# Patient Record
Sex: Female | Born: 1960
Health system: Southern US, Community
[De-identification: ages and names within clinical notes are randomized; demographics above are authoritative.]

## PROBLEM LIST (undated history)

## (undated) DIAGNOSIS — T8859XA Other complications of anesthesia, initial encounter: Secondary | ICD-10-CM

## (undated) DIAGNOSIS — F32A Depression, unspecified: Secondary | ICD-10-CM

## (undated) DIAGNOSIS — H539 Unspecified visual disturbance: Secondary | ICD-10-CM

## (undated) DIAGNOSIS — H269 Unspecified cataract: Secondary | ICD-10-CM

## (undated) DIAGNOSIS — M542 Cervicalgia: Secondary | ICD-10-CM

## (undated) DIAGNOSIS — C50919 Malignant neoplasm of unspecified site of unspecified female breast: Secondary | ICD-10-CM

## (undated) DIAGNOSIS — I341 Nonrheumatic mitral (valve) prolapse: Secondary | ICD-10-CM

## (undated) DIAGNOSIS — R519 Headache, unspecified: Secondary | ICD-10-CM

## (undated) DIAGNOSIS — Z923 Personal history of irradiation: Secondary | ICD-10-CM

## (undated) DIAGNOSIS — Z9221 Personal history of antineoplastic chemotherapy: Secondary | ICD-10-CM

## (undated) DIAGNOSIS — F329 Major depressive disorder, single episode, unspecified: Secondary | ICD-10-CM

## (undated) DIAGNOSIS — M797 Fibromyalgia: Secondary | ICD-10-CM

## (undated) DIAGNOSIS — R51 Headache: Secondary | ICD-10-CM

## (undated) DIAGNOSIS — I059 Rheumatic mitral valve disease, unspecified: Secondary | ICD-10-CM

## (undated) DIAGNOSIS — C801 Malignant (primary) neoplasm, unspecified: Secondary | ICD-10-CM

## (undated) DIAGNOSIS — R011 Cardiac murmur, unspecified: Secondary | ICD-10-CM

## (undated) DIAGNOSIS — I10 Essential (primary) hypertension: Secondary | ICD-10-CM

## (undated) DIAGNOSIS — F419 Anxiety disorder, unspecified: Secondary | ICD-10-CM

## (undated) DIAGNOSIS — H3530 Unspecified macular degeneration: Secondary | ICD-10-CM

## (undated) DIAGNOSIS — C50412 Malignant neoplasm of upper-outer quadrant of left female breast: Secondary | ICD-10-CM

## (undated) DIAGNOSIS — Z1379 Encounter for other screening for genetic and chromosomal anomalies: Principal | ICD-10-CM

## (undated) DIAGNOSIS — K219 Gastro-esophageal reflux disease without esophagitis: Secondary | ICD-10-CM

## (undated) DIAGNOSIS — M1990 Unspecified osteoarthritis, unspecified site: Secondary | ICD-10-CM

## (undated) DIAGNOSIS — T4145XA Adverse effect of unspecified anesthetic, initial encounter: Secondary | ICD-10-CM

## (undated) HISTORY — DX: Unspecified macular degeneration: H35.30

## (undated) HISTORY — DX: Unspecified osteoarthritis, unspecified site: M19.90

## (undated) HISTORY — DX: Depression, unspecified: F32.A

## (undated) HISTORY — DX: Fibromyalgia: M79.7

## (undated) HISTORY — DX: Personal history of irradiation: Z92.3

## (undated) HISTORY — DX: Gastro-esophageal reflux disease without esophagitis: K21.9

## (undated) HISTORY — PX: JOINT REPLACEMENT: SHX530

## (undated) HISTORY — DX: Unspecified visual disturbance: H53.9

## (undated) HISTORY — PX: HIP SURGERY: SHX245

## (undated) HISTORY — PX: MITRAL VALVE REPAIR: SHX2039

## (undated) HISTORY — DX: Unspecified cataract: H26.9

## (undated) HISTORY — DX: Encounter for other screening for genetic and chromosomal anomalies: Z13.79

## (undated) HISTORY — DX: Cardiac murmur, unspecified: R01.1

## (undated) HISTORY — DX: Anxiety disorder, unspecified: F41.9

## (undated) HISTORY — DX: Major depressive disorder, single episode, unspecified: F32.9

## (undated) HISTORY — PX: SHOULDER SURGERY: SHX246

## (undated) HISTORY — DX: Malignant neoplasm of upper-outer quadrant of left female breast: C50.412

---

## 1986-09-17 HISTORY — PX: TUBAL LIGATION: SHX77

## 1999-08-17 ENCOUNTER — Other Ambulatory Visit: Admission: RE | Admit: 1999-08-17 | Discharge: 1999-08-17 | Payer: Self-pay | Admitting: *Deleted

## 2000-11-11 ENCOUNTER — Encounter: Payer: Self-pay | Admitting: Family Medicine

## 2000-11-11 ENCOUNTER — Encounter: Admission: RE | Admit: 2000-11-11 | Discharge: 2000-11-11 | Payer: Self-pay | Admitting: Family Medicine

## 2001-06-13 ENCOUNTER — Encounter: Payer: Self-pay | Admitting: Orthopedic Surgery

## 2001-06-16 ENCOUNTER — Ambulatory Visit (HOSPITAL_COMMUNITY): Admission: RE | Admit: 2001-06-16 | Discharge: 2001-06-16 | Payer: Self-pay | Admitting: Orthopedic Surgery

## 2002-04-08 ENCOUNTER — Other Ambulatory Visit: Admission: RE | Admit: 2002-04-08 | Discharge: 2002-04-08 | Payer: Self-pay | Admitting: *Deleted

## 2002-09-23 ENCOUNTER — Emergency Department (HOSPITAL_COMMUNITY): Admission: EM | Admit: 2002-09-23 | Discharge: 2002-09-23 | Payer: Self-pay | Admitting: Emergency Medicine

## 2004-10-16 ENCOUNTER — Ambulatory Visit: Payer: Self-pay | Admitting: Family Medicine

## 2004-10-30 ENCOUNTER — Ambulatory Visit: Payer: Self-pay | Admitting: Family Medicine

## 2005-05-22 ENCOUNTER — Ambulatory Visit: Payer: Self-pay | Admitting: Family Medicine

## 2005-07-31 ENCOUNTER — Ambulatory Visit: Payer: Self-pay | Admitting: Family Medicine

## 2005-09-06 ENCOUNTER — Ambulatory Visit: Payer: Self-pay | Admitting: Family Medicine

## 2006-05-01 ENCOUNTER — Ambulatory Visit: Payer: Self-pay | Admitting: Family Medicine

## 2006-05-08 ENCOUNTER — Ambulatory Visit: Payer: Self-pay | Admitting: Family Medicine

## 2006-05-24 ENCOUNTER — Ambulatory Visit: Payer: Self-pay | Admitting: Family Medicine

## 2008-02-18 ENCOUNTER — Ambulatory Visit: Payer: Self-pay | Admitting: Family Medicine

## 2008-02-18 DIAGNOSIS — M545 Low back pain, unspecified: Secondary | ICD-10-CM | POA: Insufficient documentation

## 2008-02-18 DIAGNOSIS — Z8679 Personal history of other diseases of the circulatory system: Secondary | ICD-10-CM | POA: Insufficient documentation

## 2008-02-18 DIAGNOSIS — M797 Fibromyalgia: Secondary | ICD-10-CM | POA: Insufficient documentation

## 2008-07-15 ENCOUNTER — Ambulatory Visit (HOSPITAL_COMMUNITY): Admission: RE | Admit: 2008-07-15 | Discharge: 2008-07-15 | Payer: Self-pay | Admitting: Family Medicine

## 2008-07-15 ENCOUNTER — Encounter (INDEPENDENT_AMBULATORY_CARE_PROVIDER_SITE_OTHER): Payer: Self-pay | Admitting: Internal Medicine

## 2008-07-21 ENCOUNTER — Encounter (INDEPENDENT_AMBULATORY_CARE_PROVIDER_SITE_OTHER): Payer: Self-pay | Admitting: Internal Medicine

## 2008-07-29 ENCOUNTER — Encounter (INDEPENDENT_AMBULATORY_CARE_PROVIDER_SITE_OTHER): Payer: Self-pay | Admitting: *Deleted

## 2008-08-28 ENCOUNTER — Emergency Department (HOSPITAL_COMMUNITY): Admission: EM | Admit: 2008-08-28 | Discharge: 2008-08-28 | Payer: Self-pay | Admitting: Emergency Medicine

## 2009-04-18 ENCOUNTER — Telehealth (INDEPENDENT_AMBULATORY_CARE_PROVIDER_SITE_OTHER): Payer: Self-pay | Admitting: Internal Medicine

## 2009-04-19 ENCOUNTER — Ambulatory Visit: Payer: Self-pay | Admitting: Family Medicine

## 2009-04-19 DIAGNOSIS — I10 Essential (primary) hypertension: Secondary | ICD-10-CM | POA: Insufficient documentation

## 2009-04-20 ENCOUNTER — Ambulatory Visit: Payer: Self-pay | Admitting: Family Medicine

## 2009-04-21 LAB — CONVERTED CEMR LAB
ALT: 22 units/L (ref 0–35)
AST: 21 units/L (ref 0–37)
Albumin: 4.2 g/dL (ref 3.5–5.2)
Alkaline Phosphatase: 45 units/L (ref 39–117)
BUN: 11 mg/dL (ref 6–23)
Basophils Relative: 1.1 % (ref 0.0–3.0)
Chloride: 108 meq/L (ref 96–112)
Cholesterol: 144 mg/dL (ref 0–200)
Eosinophils Absolute: 0.1 10*3/uL (ref 0.0–0.7)
Eosinophils Relative: 1.1 % (ref 0.0–5.0)
Lymphocytes Relative: 19.4 % (ref 12.0–46.0)
Lymphs Abs: 2.1 10*3/uL (ref 0.7–4.0)
MCHC: 35.7 g/dL (ref 30.0–36.0)
MCV: 87.9 fL (ref 78.0–100.0)
Monocytes Absolute: 0.7 10*3/uL (ref 0.1–1.0)
Neutro Abs: 7.8 10*3/uL — ABNORMAL HIGH (ref 1.4–7.7)
Neutrophils Relative %: 72.2 % (ref 43.0–77.0)
Platelets: 228 10*3/uL (ref 150.0–400.0)
Potassium: 4.2 meq/L (ref 3.5–5.1)
RDW: 12.3 % (ref 11.5–14.6)
TSH: 1.64 microintl units/mL (ref 0.35–5.50)
Total Bilirubin: 0.9 mg/dL (ref 0.3–1.2)
Triglycerides: 78 mg/dL (ref 0.0–149.0)

## 2009-05-17 ENCOUNTER — Encounter (INDEPENDENT_AMBULATORY_CARE_PROVIDER_SITE_OTHER): Payer: Self-pay | Admitting: Internal Medicine

## 2009-07-05 ENCOUNTER — Ambulatory Visit: Payer: Self-pay | Admitting: Family Medicine

## 2009-07-05 LAB — CONVERTED CEMR LAB
Albumin: 4 g/dL (ref 3.5–5.2)
Glucose, Bld: 93 mg/dL (ref 70–99)
Potassium: 3.7 meq/L (ref 3.5–5.1)
Sodium: 138 meq/L (ref 135–145)

## 2009-07-06 LAB — CONVERTED CEMR LAB: Vit D, 25-Hydroxy: 29 ng/mL — ABNORMAL LOW (ref 30–89)

## 2009-07-29 ENCOUNTER — Encounter: Payer: Self-pay | Admitting: Family Medicine

## 2009-08-10 ENCOUNTER — Encounter: Admission: RE | Admit: 2009-08-10 | Discharge: 2009-09-13 | Payer: Self-pay | Admitting: Rheumatology

## 2009-09-14 ENCOUNTER — Encounter: Admission: RE | Admit: 2009-09-14 | Discharge: 2009-09-14 | Payer: Self-pay | Admitting: Rheumatology

## 2009-09-14 ENCOUNTER — Encounter: Payer: Self-pay | Admitting: Family Medicine

## 2009-10-05 ENCOUNTER — Encounter: Admission: RE | Admit: 2009-10-05 | Discharge: 2009-11-22 | Payer: Self-pay | Admitting: Specialist

## 2009-10-13 ENCOUNTER — Ambulatory Visit: Payer: Self-pay | Admitting: Family Medicine

## 2009-10-13 DIAGNOSIS — E559 Vitamin D deficiency, unspecified: Secondary | ICD-10-CM | POA: Insufficient documentation

## 2009-10-13 DIAGNOSIS — M719 Bursopathy, unspecified: Secondary | ICD-10-CM

## 2009-10-13 DIAGNOSIS — M67919 Unspecified disorder of synovium and tendon, unspecified shoulder: Secondary | ICD-10-CM | POA: Insufficient documentation

## 2009-10-13 DIAGNOSIS — G4700 Insomnia, unspecified: Secondary | ICD-10-CM | POA: Insufficient documentation

## 2009-10-13 LAB — CONVERTED CEMR LAB
BUN: 10 mg/dL (ref 6–23)
Potassium: 3.7 meq/L (ref 3.5–5.1)

## 2009-10-15 ENCOUNTER — Encounter: Payer: Self-pay | Admitting: Family Medicine

## 2009-12-05 ENCOUNTER — Emergency Department (HOSPITAL_COMMUNITY): Admission: EM | Admit: 2009-12-05 | Discharge: 2009-12-05 | Payer: Self-pay | Admitting: Emergency Medicine

## 2010-01-12 ENCOUNTER — Encounter: Admission: RE | Admit: 2010-01-12 | Discharge: 2010-01-12 | Payer: Self-pay | Admitting: Neurosurgery

## 2010-05-01 ENCOUNTER — Ambulatory Visit: Payer: Self-pay | Admitting: Family Medicine

## 2010-05-16 ENCOUNTER — Inpatient Hospital Stay (HOSPITAL_COMMUNITY): Admission: RE | Admit: 2010-05-16 | Discharge: 2010-05-17 | Payer: Self-pay | Admitting: Neurosurgery

## 2010-06-14 ENCOUNTER — Ambulatory Visit: Payer: Self-pay | Admitting: Family Medicine

## 2010-06-14 ENCOUNTER — Encounter: Admission: RE | Admit: 2010-06-14 | Discharge: 2010-06-14 | Payer: Self-pay | Admitting: Family Medicine

## 2010-06-14 DIAGNOSIS — R51 Headache: Secondary | ICD-10-CM | POA: Insufficient documentation

## 2010-06-14 DIAGNOSIS — R519 Headache, unspecified: Secondary | ICD-10-CM | POA: Insufficient documentation

## 2010-07-07 ENCOUNTER — Ambulatory Visit: Payer: Self-pay | Admitting: Family Medicine

## 2010-08-31 ENCOUNTER — Ambulatory Visit (HOSPITAL_COMMUNITY)
Admission: RE | Admit: 2010-08-31 | Discharge: 2010-08-31 | Payer: Self-pay | Source: Home / Self Care | Attending: Family Medicine | Admitting: Family Medicine

## 2010-09-10 ENCOUNTER — Emergency Department (HOSPITAL_COMMUNITY)
Admission: EM | Admit: 2010-09-10 | Discharge: 2010-09-10 | Payer: Self-pay | Source: Home / Self Care | Admitting: Emergency Medicine

## 2010-09-17 HISTORY — PX: OTHER SURGICAL HISTORY: SHX169

## 2010-09-17 HISTORY — PX: ROTATOR CUFF REPAIR: SHX139

## 2010-09-27 ENCOUNTER — Ambulatory Visit
Admission: RE | Admit: 2010-09-27 | Discharge: 2010-09-27 | Payer: Self-pay | Source: Home / Self Care | Attending: Family Medicine | Admitting: Family Medicine

## 2010-10-05 ENCOUNTER — Encounter: Payer: Self-pay | Admitting: Family Medicine

## 2010-10-18 ENCOUNTER — Encounter: Payer: Self-pay | Admitting: Family Medicine

## 2010-10-19 NOTE — Assessment & Plan Note (Signed)
Summary: ?REFERRAL TO PAIN MANAGEMENT/CLE   Vital Signs:  Patient profile:   50 year old female Height:      63 inches Weight:      201 pounds BMI:     35.73 Temp:     98.1 degrees F oral Pulse rate:   72 / minute Pulse rhythm:   regular BP sitting:   140 / 90  (left arm) Cuff size:   regular  Vitals Entered By: Linde Gillis CMA Duncan Dull) (July 07, 2010 2:58 PM) CC: referral to pain management   History of Present Illness: 61 here here for ?referral to pain management or rheumatolgy.  Fibromyalgia- has been seeing rheumatologist, Dr. Dareen Piano.  Felt it was controlled with just NSAIDs but then developped left shoulder pain and cramping in hands and feet.  Has been going to orthopedist who has sent her to PT for her left shoulder ( arthritis/rotator cuff).  SHoulder better but still has cramps in hands and feet.  Vicodin was helping but she ran out. Has tried SSRIs, Lyrica, Cymbalta.  Nothing has helped. Had an arguement with Dr. Ewell Poe office and wants to find another physician. Very stiff in the morning, hurts all over.    Was seen for migraine last month, no recurrent headaches.   Current Medications (verified): 1)  Hydrochlorothiazide 25 Mg Tabs (Hydrochlorothiazide) .... Take 1 Tablet By Mouth Once A Day 2)  Ibuprofen 200 Mg Tabs (Ibuprofen) .... Take One Tablet By Mouth As Needed For Fibromyalgia 3)  Ambien 5 Mg Tabs (Zolpidem Tartrate) .Marland Kitchen.. 1 Tab By Mouth At Bedtime As Needed Insomnia  Allergies: 1)  ! Erythromycin  Review of Systems      See HPI MS:  Complains of joint pain and muscle aches; denies muscle weakness. Psych:  Complains of anxiety and depression; denies sense of great danger, suicidal thoughts/plans, thoughts of violence, unusual visions or sounds, and thoughts /plans of harming others.  Physical Exam  General:  alert, very tearful Psych:  good eye contact, tearful   Impression & Recommendations:  Problem # 1:  FIBROMYALGIA  (ICD-729.1) Assessment Deteriorated Time spent with patient 25 minutes, more than 50% of this time was spent counseling patient on next treatment options for fibromyalgia.  I explained that pain clinic would not be a good choice for her in that narcotics are not the best treatment for fibromyalgia.  Since she has tried all of the standard tx for fibromyalgia, will refer to another rheumatologist.   FMLA paperwork filled out in office today.  Her updated medication list for this problem includes:    Ibuprofen 200 Mg Tabs (Ibuprofen) .Marland Kitchen... Take one tablet by mouth as needed for fibromyalgia  Orders: Rheumatology Referral (Rheumatology)  Complete Medication List: 1)  Hydrochlorothiazide 25 Mg Tabs (Hydrochlorothiazide) .... Take 1 tablet by mouth once a day 2)  Ibuprofen 200 Mg Tabs (Ibuprofen) .... Take one tablet by mouth as needed for fibromyalgia 3)  Ambien 5 Mg Tabs (Zolpidem tartrate) .Marland Kitchen.. 1 tab by mouth at bedtime as needed insomnia  Patient Instructions: 1)  Please stop by to see Shirlee Limerick on your way out. Prescriptions: AMBIEN 5 MG TABS (ZOLPIDEM TARTRATE) 1 tab by mouth at bedtime as needed insomnia  #30 x 0   Entered and Authorized by:   Ruthe Mannan MD   Signed by:   Ruthe Mannan MD on 07/07/2010   Method used:   Print then Give to Patient   RxID:   [id number]    Orders Added: 1)  Rheumatology Referral [Rheumatology] 2)  Est. Patient Level IV [16109]    Current Allergies (reviewed today): ! ERYTHROMYCIN

## 2010-10-19 NOTE — Letter (Signed)
Summary: Records from [facility] 2010  Records from [facility] 2010   Imported By: Maryln Gottron 10/11/2010 13:28:55  _____________________________________________________________________  External Attachment:    Type:   Image     Comment:   External Document

## 2010-10-19 NOTE — Assessment & Plan Note (Signed)
Summary: FIBROMYALGIA   Vital Signs:  Patient profile:   50 year old female Height:      63 inches Weight:      199.50 pounds BMI:     35.47 Temp:     98.4 degrees F oral Pulse rate:   76 / minute Pulse rhythm:   regular BP sitting:   160 / 110  (left arm) Cuff size:   large  Vitals Entered By: Linde Gillis CMA (AAMA) (September 27, 2010 8:00 AM) CC: fibromyalgia   History of Present Illness: 27 here here for ?referral to pain management or rheumatolgy.  Fibromyalgia- was seeing rheumatologist, Dr. Dareen Piano.  Felt it was controlled with just NSAIDs but then developped left shoulder pain and cramping in hands and feet.  Has been going to orthopedist who has sent her to PT for her left shoulder ( arthritis/rotator cuff).  SHoulder better but still has cramps in hands and feet and for past month, increased pain in her knees and heck. Vicodin was helping but she ran out and is with not taking chronic narcotics. Tanning bed helps with severe pain.  Has tried SSRIs, Savella, Lyrica, Cymbalta.  Nothing has helped.  Taking 20 Ibuprofen a day- pain is mainly in legs, hands, shoulders, neck, knees.  Has FMLA paperwork again today with her stating that she can work but may need accomodations.   Current Medications (verified): 1)  Hydrochlorothiazide 25 Mg Tabs (Hydrochlorothiazide) .... Take 1 Tablet By Mouth Once A Day 2)  Ibuprofen 200 Mg Tabs (Ibuprofen) .... Take One Tablet By Mouth As Needed For Fibromyalgia 3)  Ambien 5 Mg Tabs (Zolpidem Tartrate) .Marland Kitchen.. 1 Tab By Mouth At Bedtime As Needed Insomnia 4)  Tramadol Hcl 50 Mg  Tabs (Tramadol Hcl) .Marland Kitchen.. 1 Tab By Mouth Two Times A Day As Needed For Pain.  Allergies: 1)  ! Erythromycin  Past History:  Past Surgical History: Last updated: 02/18/2008 heart surgery for MVP 1970  Past Medical History: fibromyalgia  Review of Systems      See HPI General:  Complains of fatigue and malaise. MS:  Complains of joint pain.  Physical  Exam  General:  alert, very tearful Msk:  No deformity or scoliosis noted of thoracic or lumbar spine.   Neurologic:  No cranial nerve deficits noted. Station and gait are normal.  DTRs are symmetrical throughout. Psych:  good eye contact, tearful   Impression & Recommendations:  Problem # 1:  FIBROMYALGIA (ICD-729.1) Assessment Deteriorated Time spent with patient 25 minutes, more than 50% of this time was spent counseling patient on fibromyaglia. Will try Tramadol for pain, counselled on importance of cutting back on NSAIDs. Referral placed for rheumatologist, pt would like to try one at [facility] that she has heard good things about. FMLA paperwork filled out in office and returned to pt.  Her updated medication list for this problem includes:    Ibuprofen 200 Mg Tabs (Ibuprofen) .Marland Kitchen... Take one tablet by mouth as needed for fibromyalgia    Tramadol Hcl 50 Mg Tabs (Tramadol hcl) .Marland Kitchen... 1 tab by mouth two times a day as needed for pain.  Orders: Rheumatology Referral (Rheumatology)  Complete Medication List: 1)  Hydrochlorothiazide 25 Mg Tabs (Hydrochlorothiazide) .... Take 1 tablet by mouth once a day 2)  Ibuprofen 200 Mg Tabs (Ibuprofen) .... Take one tablet by mouth as needed for fibromyalgia 3)  Ambien 5 Mg Tabs (Zolpidem tartrate) .Marland Kitchen.. 1 tab by mouth at bedtime as needed insomnia 4)  Tramadol Hcl 50  Mg Tabs (Tramadol hcl) .Marland Kitchen.. 1 tab by mouth two times a day as needed for pain.  Patient Instructions: 1)  Please stop by to see Shirlee Limerick on your way out. Prescriptions: TRAMADOL HCL 50 MG  TABS (TRAMADOL HCL) 1 tab by mouth two times a day as needed for pain.  #60 x 0   Entered and Authorized by:   Ruthe Mannan MD   Signed by:   Ruthe Mannan MD on 09/27/2010   Method used:   [facility]        [facility]  [address] [id number]       [address]       [place], [place]  [address]       Ph: [phone]       Fax: [phone]   RxID:    [id number]    Orders Added: 1)  Rheumatology Referral [Rheumatology] 2)  Est. Patient Level IV [27253]    Current Allergies (reviewed today): ! ERYTHROMYCIN

## 2010-10-19 NOTE — Letter (Signed)
Summary: [facility]   Imported By: Lanelle Bal 10/26/2009 11:29:51  _____________________________________________________________________  External Attachment:    Type:   Image     Comment:   External Document

## 2010-10-19 NOTE — Assessment & Plan Note (Signed)
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 yes     Location: head Intensity: 8 Onset of pain  this morning    History of Present Illness: Chief complaint headache since this morning  Headache began this AM 30 min after awaking. Dull throbbing pain behind left eye. Steadily worse through the day.Stacy KitchenMarland KitchenMarland Kitchen8/10 on pain scale Left side of face tingly. Nausea, no photophonophobia. No mental status changes, no slurred speech, no weakness in muscles. She does feel somewhat off balanced and cloudy thinking due to pain. Tried hydrocodone 5 mg and 400 mg ibuprofen this AM...helped NONE.   Recent neck surgery 8/30..fusion cervical vertebrae.   No recent falls. No new medicine. No change in stress.    HAs had one migraine in past 10-12 years ago..but none in a longtime..see at urgent Care..given shot of medication.   Problems Prior to Update: 1)  Headache, Severe  (ICD-784.0) 2)  Insomnia  (ICD-780.52) 3)  Rotator Cuff Syndrome, Left  (ICD-726.10) 4)  Vitamin D Deficiency  (ICD-268.9) 5)  Routine General Medical Exam@health  Care [facility]  (ICD-V70.0) 6)  Hypertension  (ICD-401.9) 7)  Low Back Pain, Acute  (ICD-724.2) 8)  Mitral Valve Prolapse, Hx of  (ICD-V12.50) 9)  Fibromyalgia  (ICD-729.1)  Current Medications (verified): 1)  Hydrochlorothiazide 25 Mg Tabs (Hydrochlorothiazide) .... Take 1 Tablet By Mouth Once A Day 2)  Ibuprofen 200 Mg Tabs (Ibuprofen) .... Take One Tablet By Mouth As Needed For Fibromyalgia  Allergies (verified): 1)  ! Erythromycin  Past History:  Past medical, surgical, family and social  histories (including risk factors) reviewed, and no changes noted (except as noted below).  Past Surgical History: Reviewed history from 02/18/2008 and no changes required. heart surgery for MVP 1970  Family History: Reviewed history and no changes required.  Social History: Reviewed history and no changes required.  Review of Systems General:  Denies fatigue. Eyes:  Complains of blurring; denies discharge and double vision; B eyes,but left greater thant right. ENT:  Denies ear discharge, earache, nasal congestion, postnasal drainage, sinus pressure, and sore throat. CV:  Denies chest pain or discomfort. Resp:  Denies shortness of breath. GI:  Denies abdominal pain.  Physical Exam  General:  pt in moderate distress tearful due to pain Head:  no maxillary sinusttp Eyes:  No corneal or conjunctival inflammation noted. EOMI. Perrla. Funduscopic exam benign, without hemorrhages, exudates or papilledema. Vision grossly normal. Ears:  External ear exam shows no significant lesions or deformities.  Otoscopic examination reveals clear canals, tympanic membranes are intact bilaterally without bulging, retraction, inflammation or discharge. Hearing is grossly normal bilaterally. Nose:  External nasal examination shows no deformity or inflammation. Nasal mucosa are pink and moist without lesions or exudates. Mouth:  Oral mucosa and oropharynx without lesions or exudates.  Teeth in good repair. Neck:  no carotid bruit or thyromegaly no cervical or supraclavicular lymphadenopathy  Lungs:  Normal respiratory effort, chest expands symmetrically. Lungs are clear to auscultation, no crackles or wheezes. Heart:  Normal rate and regular rhythm. S1 and S2 normal without gallop, murmur, click, rub or other extra sounds. Abdomen:  Bowel sounds positive,abdomen soft and non-tender without masses, organomegaly or hernias noted. Pulses:  R and L posterior tibial pulses are full and equal bilaterally    Extremities:  no edema Neurologic:  No cranial nerve deficits noted. Station and gait are normal.  DTRs are symmetrical throughout. Sensory, motor and coordinative functions appear intact.finger-to-nose normal, heel-to-shin normal, and Romberg negative.    pt reports tingling in left cheek..no facial droop, good  forehead muscle movement   Impression & Recommendations:  Problem # 1:  HEADACHE, SEVERE (ICD-784.0) No clear migraine history...severe headhace out of blue..with ? tingling in right face and blurred viosin.Stacy Kitchenno recent trauma.  Eval with head CT.  Treat with toradol, phenergan.  Go to ER if headache becomes more severe and new neuro symptms  Call if not improving. Her updated medication list for this problem includes:    Ibuprofen 200 Mg Tabs (Ibuprofen) .Stacy [name]... Take one tablet by mouth as needed for fibromyalgia  Orders: Ketorolac-Toradol 15mg  (E4540) Promethazine up to 50mg  (J2550) Admin of Therapeutic Inj  intramuscular or subcutaneous (98119) Admin of Therapeutic Inj  intramuscular or subcutaneous (14782) Radiology Referral (Radiology)  Complete Medication List: 1)  Hydrochlorothiazide 25 Mg Tabs (Hydrochlorothiazide) .... Take 1 tablet by mouth once a day 2)  Ibuprofen 200 Mg Tabs (Ibuprofen) .... Take one tablet by mouth as needed for fibromyalgia  Patient Instructions: 1)  Referral Appointment Information 2)  Day/Date: 3)  Time: 4)  Place/MD: 5)  Address: 6)  Phone/Fax: 7)  Patient given appointment information. Information/Orders faxed/mailed.  8)  If headache not improving, new neurologic symptoms ..go to ER. 9)      Medication Administration  Injection # 1:    Medication: Ketorolac-Toradol 15mg     Diagnosis: HEADACHE, SEVERE (ICD-784.0)    Route: IM    Site: R deltoid    Exp Date: 11/16/2011    Lot #: [id number]    Mfr: Novartis    Comments: 30 mg IMx 1     Patient tolerated injection without complications    Given by: Benny Lennert CMA Duncan Dull)  (June 14, 2010 4:15 PM)  Injection # 2:    Medication: Promethazine up to 50mg     Diagnosis: HEADACHE, SEVERE (ICD-784.0)    Route: IM    Site: R deltoid    Exp Date: 08/18/2011    Lot #: [id number]    Mfr: Novartis    Comments: 25 mg IM x 1     Patient tolerated injection without complications    Given by: Benny Lennert CMA Duncan Dull) (June 14, 2010 4:15 PM)  Orders Added: 1)  Est. Patient Level III [99213] 2)  Ketorolac-Toradol 15mg  [J1885] 3)  Promethazine up to 50mg  [J2550] 4)  Admin of Therapeutic Inj  intramuscular or subcutaneous [96372] 5)  Admin of Therapeutic Inj  intramuscular or subcutaneous [96372] 6)  Radiology Referral [Radiology]   Medication Administration  Injection # 1:    Medication: Ketorolac-Toradol 15mg     Diagnosis: HEADACHE, SEVERE (ICD-784.0)    Route: IM    Site: R deltoid    Exp Date: 11/16/2011    Lot #: [id number]    Mfr: Novartis    Comments: 30 mg IMx 1     Patient tolerated injection without complications    Given by: Benny Lennert CMA Duncan Dull) (June 14, 2010 4:15 PM)  Injection # 2:    Medication: Promethazine up to 50mg   Diagnosis: HEADACHE, SEVERE (ICD-784.0)    Route: IM    Site: R deltoid    Exp Date: 08/18/2011    Lot #: [id number]    Mfr: Novartis    Comments: 25 mg IM x 1     Patient tolerated injection without complications    Given by: Benny Lennert CMA Duncan Dull) (June 14, 2010 4:15 PM)  Orders Added: 1)  Est. Patient Level III [99213] 2)  Ketorolac-Toradol 15mg  [J1885] 3)  Promethazine up to 50mg  [J2550] 4)  Admin of Therapeutic Inj  intramuscular or subcutaneous [96372] 5)  Admin of Therapeutic Inj  intramuscular or subcutaneous [96372] 6)  Radiology Referral [Radiology]

## 2010-10-19 NOTE — Assessment & Plan Note (Signed)
Summary: HA,EARACHE/CLE   Vital Signs:  Patient profile:   50 year old female Height:      63 inches Weight:      204.25 pounds BMI:     36.31 Temp:     98.3 degrees F oral Pulse rate:   72 / minute Pulse rhythm:   regular BP sitting:   112 / 80  (right arm) Cuff size:   large  Vitals Entered By: Linde Gillis CMA Duncan Dull) (May 01, 2010 12:30 PM) CC: headache, right ear pain   History of Present Illness: 50 yo with one week of progressively worsening URI symptoms.  Started with runny nose, sneezing cough, now has facial pressure, chills, right ear pain/popping. Taking Tylenol sinus, mucinex with no relief of symptoms. No cough, wheezing, CP or SOB.  Current Medications (verified): 1)  Hydrochlorothiazide 25 Mg Tabs (Hydrochlorothiazide) .... Take 1 Tablet By Mouth Once A Day 2)  Ibuprofen 200 Mg Tabs (Ibuprofen) .... Take One Tablet By Mouth As Needed For Fibromyalgia 3)  Augmentin 875-125 Mg Tabs (Amoxicillin-Pot Clavulanate) .Marland Kitchen.. 1 By Mouth 2 Times Daily X 10 Days  Allergies: 1)  ! Erythromycin  Review of Systems      See HPI General:  Complains of chills. ENT:  Complains of nasal congestion, sinus pressure, and sore throat. CV:  Denies chest pain or discomfort. Resp:  Denies cough, shortness of breath, sputum productive, and wheezing.  Physical Exam  General:  alert, well-developed, well-nourished, well-hydrated, and overweight-appearing, tearful. Ears:  TMs retracted bilaterally Nose:  boggy turbinates, TTP throughout Mouth:  Oral mucosa and oropharynx without lesions or exudates.  Teeth in good repair. Lungs:  Normal respiratory effort, chest expands symmetrically. Lungs are clear to auscultation, no crackles or wheezes. Heart:  Normal rate and regular rhythm. S1 and S2 normal without gallop, murmur, click, rub or other extra sounds. Psych:  normally interactive and good eye contact, anxious and tearful.     Impression & Recommendations:  Problem # 1:  OTHER  ACUTE SINUSITIS (ICD-461.8) Assessment New given duration and progression of symptoms, will treat with Augmentin.  See pt instructions for details. Her updated medication list for this problem includes:    Augmentin 875-125 Mg Tabs (Amoxicillin-pot clavulanate) .Marland Kitchen... 1 by mouth 2 times daily x 10 days  Complete Medication List: 1)  Hydrochlorothiazide 25 Mg Tabs (Hydrochlorothiazide) .... Take 1 tablet by mouth once a day 2)  Ibuprofen 200 Mg Tabs (Ibuprofen) .... Take one tablet by mouth as needed for fibromyalgia 3)  Augmentin 875-125 Mg Tabs (Amoxicillin-pot clavulanate) .Marland Kitchen.. 1 by mouth 2 times daily x 10 days  Patient Instructions: 1)  Take antibiotic as directed.  Drink lots of fluids.  Treat sympotmatically with Mucinex, nasal saline irrigation, and Tylenol/Ibuprofen. Also try claritin D or zyrtec D over the counter- two times a day as needed ( have to sign for them at pharmacy). You can use warm compresses.  Cough suppressant at night. Call if not improving as expected in 5-7 days.  Prescriptions: AUGMENTIN 875-125 MG TABS (AMOXICILLIN-POT CLAVULANATE) 1 by mouth 2 times daily x 10 days  #20 x 0   Entered and Authorized by:   Ruthe Mannan MD   Signed by:   Ruthe Mannan MD on 05/01/2010   Method used:   [facility]  [address] [id number]       [address] [facility]       [place], [place]  [address]       Ph: [phone]       Fax: [phone]   RxID:   [id number]   Current Allergies (reviewed today): ! ERYTHROMYCIN

## 2010-10-19 NOTE — Assessment & Plan Note (Signed)
Summary: ROA  XFER FROM [facility]IE--30 MIN  CYD   Vital Signs:  Patient profile:   50 year old female Height:      63 inches Weight:      205.38 pounds BMI:     36.51 Temp:     98.4 degrees F oral Pulse rate:   76 / minute Pulse rhythm:   regular BP sitting:   146 / 84  (left arm) Cuff size:   large  Vitals Entered By: Delilah Shan CMA (AAMA) (October 13, 2009 10:20 AM) CC: 30 minute  (BDB)   History of Present Illness: 50 yo patient here to establish care with me (Billie's patient).  1.  Fibromyalgia- has been seeing rheumatologist.  Felt it was controlled with just NSAIDs but then developped left shoulder pain and cramping in hands and feet.  Has been going to orthopedist who has sent her to PT for her left shoulder ( arthritis/rotator cuff).  SHoulder better but still has cramps in hands and feet.  Ortho gave her Hydrocodone, Meloxicam, and robaxin all of which is taking SCHEDULED, not as needed.  2.  Vit D deficiency- was a little low in October (29).  Has been taking 1000 units daily.  At first she thought the Vit D supplementation was helping with her fibromyalgia pain but now cramping and hands and feet has returned.  3.  Insomnia- can't sleep because she has pain all over.  Can't fall asleep or stay asleep.  Not depressed or anxious, just in pain.  4.  HTN- has been stable prior to today.  BP was 124/84 (07/05/09).  Says she is nervous today about meeting me.  No CP, SOB, LE edema, blurred vision, or HA.  5.  Well woman- needs CPE/pap.  FLP good in October.  Spent 40 minutes reviewing medical history, old chart, consults, and current concerns with patient.  Current Medications (verified): 1)  Hydrochlorothiazide 25 Mg Tabs (Hydrochlorothiazide) .... Take 1 Tablet By Mouth Once A Day 2)  Methocarbamol 500 Mg Tabs (Methocarbamol) .... Take 1 Tablet By Mouth Two Times A Day 3)  Meloxicam 7.5 Mg Tabs (Meloxicam) .... Take 1 Tablet By Mouth Once A Day 4)   Hydrocodone-Acetaminophen 10-500 Mg Tabs (Hydrocodone-Acetaminophen) .... Take 1 Tablet Every 6 Hours As Needed 5)  Trazodone Hcl 50 Mg Tabs (Trazodone Hcl) .Marland Kitchen.. 1 By Mouth At Bedtime As Needed Insomnia  Allergies: 1)  ! Erythromycin  Review of Systems      See HPI General:  Complains of malaise; denies weakness. Eyes:  Denies blurring. ENT:  Denies difficulty swallowing. CV:  Denies chest pain or discomfort. Resp:  Denies shortness of breath. GI:  Denies abdominal pain and nausea. Psych:  Denies anxiety and depression.  Physical Exam  General:  alert, well-developed, well-nourished, well-hydrated, and overweight-appearing, tearful. Ears:  External ear exam shows no significant lesions or deformities.  Otoscopic examination reveals clear canals, tympanic membranes are intact bilaterally without bulging, retraction, inflammation or discharge. Hearing is grossly normal bilaterally. Lungs:  Normal respiratory effort, chest expands symmetrically. Lungs are clear to auscultation, no crackles or wheezes. Heart:  Normal rate and regular rhythm. S1 and S2 normal without gallop, murmur, click, rub or other extra sounds. Extremities:  Nogrossly apparrent swelling echymosis or decreased ROM of hands or feet. Psych:  normally interactive and good eye contact, anxious and tearful.     Impression & Recommendations:  Problem # 1:  HYPERTENSION (ICD-401.9) Assessment Deteriorated likely secondary to white coat sydrome today as  it was well controlled at previous OV.  Will recheck BMET to r/o other causes of elevation. Her updated medication list for this problem includes:    Hydrochlorothiazide 25 Mg Tabs (Hydrochlorothiazide) .Marland Kitchen... Take 1 tablet by mouth once a day  Orders: Venipuncture (91478) TLB-BMP (Basic Metabolic Panel-BMET) (80048-METABOL)  Problem # 2:  VITAMIN D DEFICIENCY (ICD-268.9) Assessment: Unchanged Recheck Vit D today. Orders: Venipuncture (29562) T-Vitamin D (25-Hydroxy)  ([id number])  Problem # 3:  ROTATOR CUFF SYNDROME, LEFT (ICD-726.10) Assessment: Deteriorated Time spent with patient 40 minutes, more than 50% of this time was spent counseling patient on diffuse pain and difficulty sleeping due to pain.  I am personally not comfortable with her taking scheduled narcotics and muscle relaxants.  This is not a good solution for her issues, but I am not the one prescribing these medications.  she says she needs them and is following recommendations from her rheumatologist and orthopedist.  From reading last ortho note, they are in agreement that she is on too many medications and recommended steroid injections, tramadol and meloxicam for rotator cuff syndome.  Advised continued PT as well.  I will ask for most recent rheumatology note to be faxed to [place].  Problem # 4:  FIBROMYALGIA (ICD-729.1) Assessment: Deteriorated See above. The following medications were removed from the medication list:    Ibuprofen 200 Mg Tabs (Ibuprofen) .Marland Kitchen... Patient states she probably takes about 10 per day.    Flexeril 10 Mg Tabs (Cyclobenzaprine hcl) ..... One tab by mouth three times a day Her updated medication list for this problem includes:    Methocarbamol 500 Mg Tabs (Methocarbamol) .Marland Kitchen... Take 1 tablet by mouth two times a day    Meloxicam 7.5 Mg Tabs (Meloxicam) .Marland Kitchen... Take 1 tablet by mouth once a day    Hydrocodone-acetaminophen 10-500 Mg Tabs (Hydrocodone-acetaminophen) .Marland Kitchen... Take 1 tablet every 6 hours as needed  Problem # 5:  INSOMNIA (ICD-780.52) Assessment: New due to chronic pain sydrome/fibromyalgia with acute rotator cuff syndrome.  Will try Trazadone as it may help with anxiety component and fibromyalgia as well.  Advised NOT taking with narcotics and muscle relaxants.  Pt in agreement with plan.  Problem # 6:  Preventive Health Care (ICD-V70.0) Assessment: Comment Only Pt to schedule CPE/Pap with me.  Complete Medication List: 1)  Hydrochlorothiazide 25 Mg Tabs  (Hydrochlorothiazide) .... Take 1 tablet by mouth once a day 2)  Methocarbamol 500 Mg Tabs (Methocarbamol) .... Take 1 tablet by mouth two times a day 3)  Meloxicam 7.5 Mg Tabs (Meloxicam) .... Take 1 tablet by mouth once a day 4)  Hydrocodone-acetaminophen 10-500 Mg Tabs (Hydrocodone-acetaminophen) .... Take 1 tablet every 6 hours as needed 5)  Trazodone Hcl 50 Mg Tabs (Trazodone hcl) .Marland Kitchen.. 1 by mouth at bedtime as needed insomnia  Patient Instructions: 1)  Please make an appointment to come see me for your complete physical/pap smear. 2)  It was very nice to meet you. Prescriptions: TRAZODONE HCL 50 MG TABS (TRAZODONE HCL) 1 by mouth at bedtime as needed insomnia  #30 x 0   Entered and Authorized by:   Ruthe Mannan MD   Signed by:   Ruthe Mannan MD on 10/13/2009   Method used:   Electronically to        [facility]  [address] [id number] ([facility]       [address]       [place], [place]  [address]  Ph: [phone]       Fax: [phone]   RxID:   [id number]   Current Allergies (reviewed today): ! ERYTHROMYCIN

## 2010-10-19 NOTE — Consult Note (Signed)
Summary: Oletta Lamas MD  Oletta Lamas MD   Imported By: Lanelle Bal 10/25/2009 12:16:39  _____________________________________________________________________  External Attachment:    Type:   Image     Comment:   External Document

## 2010-10-25 NOTE — Letter (Signed)
Summary: Rheumatolgy Consult  [facility] Consult   Imported By: Kassie Mends 10/20/2010 09:36:10  _____________________________________________________________________  External Attachment:    Type:   Image     Comment:   External Document

## 2010-11-02 NOTE — Consult Note (Signed)
Summary: [facility]   Imported By: Maryln Gottron 10/26/2010 15:29:20  _____________________________________________________________________  External Attachment:    Type:   Image     Comment:   External Document  Appended Document: [facility] gabapentin, vicodin, aquatic therapy prescribed for FMS.

## 2010-12-01 LAB — URINALYSIS, ROUTINE W REFLEX MICROSCOPIC
Glucose, UA: NEGATIVE mg/dL
Ketones, ur: NEGATIVE mg/dL
Leukocytes, UA: NEGATIVE
Nitrite: NEGATIVE
Protein, ur: NEGATIVE mg/dL
Urobilinogen, UA: 0.2 mg/dL (ref 0.0–1.0)

## 2010-12-01 LAB — COMPREHENSIVE METABOLIC PANEL
AST: 20 U/L (ref 0–37)
BUN: 8 mg/dL (ref 6–23)
CO2: 27 mEq/L (ref 19–32)
Calcium: 9.5 mg/dL (ref 8.4–10.5)
Creatinine, Ser: 0.75 mg/dL (ref 0.4–1.2)
GFR calc non Af Amer: >60 mL/min (ref >60)
Potassium: 3.9 mEq/L (ref 3.5–5.1)
Total Bilirubin: 0.6 mg/dL (ref 0.3–1.2)
Total Protein: 7.3 g/dL (ref 6.0–8.3)

## 2010-12-01 LAB — CBC
HCT: 42.2 % (ref 36.0–46.0)
Hemoglobin: 14.6 g/dL (ref 12.0–15.0)
MCHC: 34.6 g/dL (ref 30.0–36.0)
RBC: 4.76 MIL/uL (ref 3.87–5.11)
RDW: 12.4 % (ref 11.5–15.5)
WBC: 11.8 10*3/uL — ABNORMAL HIGH (ref 4.0–10.5)

## 2010-12-01 LAB — SURGICAL PCR SCREEN: MRSA, PCR: NEGATIVE

## 2010-12-01 LAB — URINE MICROSCOPIC-ADD ON

## 2010-12-01 LAB — PROTIME-INR
INR: 0.96 (ref 0.00–1.49)
Prothrombin Time: 13 seconds (ref 11.6–15.2)

## 2010-12-01 LAB — APTT: aPTT: 29 seconds (ref 24–37)

## 2010-12-01 LAB — DIFFERENTIAL
Basophils Absolute: 0 10*3/uL (ref 0.0–0.1)
Basophils Relative: 0 % (ref 0–1)
Eosinophils Absolute: 0.1 10*3/uL (ref 0.0–0.7)
Neutro Abs: 8.4 10*3/uL — ABNORMAL HIGH (ref 1.7–7.7)
Neutrophils Relative %: 71 % (ref 43–77)

## 2011-01-02 ENCOUNTER — Encounter: Payer: Self-pay | Admitting: Family Medicine

## 2011-01-02 LAB — HM MAMMOGRAPHY

## 2011-01-04 ENCOUNTER — Telehealth: Payer: Self-pay | Admitting: *Deleted

## 2011-01-04 ENCOUNTER — Ambulatory Visit (INDEPENDENT_AMBULATORY_CARE_PROVIDER_SITE_OTHER): Payer: 59 | Admitting: Family Medicine

## 2011-01-04 ENCOUNTER — Encounter: Payer: Self-pay | Admitting: Family Medicine

## 2011-01-04 DIAGNOSIS — R5381 Other malaise: Secondary | ICD-10-CM

## 2011-01-04 DIAGNOSIS — I10 Essential (primary) hypertension: Secondary | ICD-10-CM

## 2011-01-04 DIAGNOSIS — IMO0001 Reserved for inherently not codable concepts without codable children: Secondary | ICD-10-CM

## 2011-01-04 DIAGNOSIS — R631 Polydipsia: Secondary | ICD-10-CM

## 2011-01-04 DIAGNOSIS — R5383 Other fatigue: Secondary | ICD-10-CM

## 2011-01-04 LAB — CBC WITH DIFFERENTIAL/PLATELET
Basophils Relative: 0.3 % (ref 0.0–3.0)
Eosinophils Relative: 3.4 % (ref 0.0–5.0)
HCT: 40.6 % (ref 36.0–46.0)
Lymphocytes Relative: 20.6 % (ref 12.0–46.0)
Lymphs Abs: 2.7 10*3/uL (ref 0.7–4.0)
MCHC: 34.7 g/dL (ref 30.0–36.0)
Neutrophils Relative %: 69.2 % (ref 43.0–77.0)
Platelets: 247 10*3/uL (ref 150.0–400.0)
RBC: 4.47 Mil/uL (ref 3.87–5.11)
RDW: 12.9 % (ref 11.5–14.6)
WBC: 13.3 10*3/uL — ABNORMAL HIGH (ref 4.5–10.5)

## 2011-01-04 LAB — BASIC METABOLIC PANEL
Chloride: 104 mEq/L (ref 96–112)
GFR: 107.51 mL/min (ref >60.00)

## 2011-01-04 NOTE — Assessment & Plan Note (Signed)
New. ? Secondary to gabapentin. Will check labs to rule out other reversible causes. Orders Placed This Encounter  Procedures  . Basic Metabolic Panel (BMET)  . CBC w/Diff  . HgB A1c

## 2011-01-04 NOTE — Progress Notes (Signed)
48 here here for :  Fibromyalgia- improved since she started seeing new rheumatolgist, Dr. Ulysees Barns. Now on gabapentin. Wants to do water therapy but needs FMLA accomodation forms filled out by me for her to be able to take time from work to attend.    Increased thirst- past several days, increased thirst and urination. She does not have a h/o DM. Does have a family h/o DM.  Review of Systems       See HPI General:  Complains of fatigue and malaise. MS:  Complains of joint pain.  Physical Exam BP 120/84  Pulse 72  Temp(Src) 98.5 F (36.9 C) (Oral)  Ht 5\' 4"  (1.626 m)  Wt 207 lb 12.8 oz (94.257 kg)  BMI 35.67 kg/m2  LMP 12/20/2010  General:  alert, very tearful Msk:  No deformity or scoliosis noted of thoracic or lumbar spine.   Neurologic:  No cranial nerve deficits noted. Station and gait are normal.  DTRs are symmetrical throughout. Psych:  good eye contact, tearful

## 2011-01-04 NOTE — Telephone Encounter (Signed)
Faxed completed paper work to [employer] at [phone] per patients request.

## 2011-01-04 NOTE — Assessment & Plan Note (Signed)
Filled out accomadation forms and will fax back for pt. Agree that water therapy would be very beneficial for this patient.

## 2011-01-17 ENCOUNTER — Telehealth: Payer: Self-pay | Admitting: *Deleted

## 2011-01-17 NOTE — Telephone Encounter (Signed)
Patient called back about her accomodation form that was filled out at her last visit. She says that her work would not except it because 2 of the questions on page 3 were not detailed enough. She needs for the question about standing to say that she can stand over 45 mins and the question that is about sitting to says that she is to be allowed to get up and move around every hour to hour and 1/2 .  Form is on your desk for correction.

## 2011-01-18 NOTE — Telephone Encounter (Signed)
Corrections made, form faxed to [employer].  Patient notified via message left on cell phone voicemail.

## 2011-01-30 ENCOUNTER — Encounter: Payer: Self-pay | Admitting: Family Medicine

## 2011-01-30 ENCOUNTER — Ambulatory Visit (INDEPENDENT_AMBULATORY_CARE_PROVIDER_SITE_OTHER): Payer: 59 | Admitting: Family Medicine

## 2011-01-30 VITALS — BP 110/82 | HR 89 | Temp 97.6°F | Ht 64.0 in | Wt 171.4 lb

## 2011-01-30 DIAGNOSIS — F4320 Adjustment disorder, unspecified: Secondary | ICD-10-CM

## 2011-01-30 DIAGNOSIS — F4321 Adjustment disorder with depressed mood: Secondary | ICD-10-CM | POA: Insufficient documentation

## 2011-01-30 MED ORDER — ALPRAZOLAM 0.5 MG PO TABS: 0.5000 mg | ORAL_TABLET | Freq: Three times a day (TID) | ORAL | Status: DC | PRN

## 2011-01-30 NOTE — Patient Instructions (Signed)
Please stop by to see Stacy [name] on your way out. 
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
## 2011-02-01 ENCOUNTER — Ambulatory Visit (INDEPENDENT_AMBULATORY_CARE_PROVIDER_SITE_OTHER): Payer: PRIVATE HEALTH INSURANCE | Admitting: Licensed Clinical Social Worker

## 2011-02-01 DIAGNOSIS — F4323 Adjustment disorder with mixed anxiety and depressed mood: Secondary | ICD-10-CM

## 2011-02-02 NOTE — Op Note (Signed)
[name]  Patient:    Stacy [name], FROHLICH Visit Number: [id number] MRN: [id number]          Service Type: DSU Location: [facility] 2550 03 Attending Physician:  Colbert Ewing Dictated by:   Loreta Ave, M.D. Proc. Date: 06/16/01 Admit Date:  06/16/2001 Discharge Date: 06/16/2001                             Operative Report  PREOPERATIVE DIAGNOSIS:  Chronic impingement of right shoulder with distal clavicle chondromalacia and degenerative joint disease.  POSTOPERATIVE DIAGNOSIS:  Chronic impingement of right shoulder with distal clavicle chondromalacia and degenerative joint disease with anterior labral tear.  PROCEDURE:  Right shoulder exam under anesthesia, arthroscopy, debridement of labrum with arthroscopic acromioplasty, CA ligament release, and distal clavicle excision.  SURGEON:  Loreta Ave, M.D.  ASSISTANT:  Arlys John D. Petrarca, P.A.-C.  ANESTHESIA:  General.  ESTIMATED BLOOD LOSS:  Minimal.  SPECIMENS:  None.  CULTURES:  None.  COMPLICATIONS:  None.  DRESSING:  Self-compressive with sling.  DESCRIPTION OF PROCEDURE:  The patient was brought to the operating room and placed on the operating table in the supine position.  After adequate anesthesia had been obtained, the right shoulder was examined.  Full motion and good stability.  Placed in a beach chair position on the shoulder positioner, prepped and draped in the usual sterile fashion.  Three standard arthroscopic portals, anterior, posterior, and lateral.  The shoulder entered with a blunt obturator, distended, and inspected.  Some tearing of the anterior labrum debrided to a stable surface.  No instability pattern. Rotator cuff, biceps tendon, biceps anchor, capsular ligamentous structures, and articular cartilage all otherwise intact.  After debriding the labrum, the cannula redirected subacromially.  Typical picture of impingement.  The bursa resected.   Cuff assessed and no full thickness tears.  Type 2 acromion with impingement signs and abrasion on the top of the cuff, but not on the under surface of the acromion.  Acromioplasty to a type 1 acromion with a shaver and high speed bur.  CA ligament incised. Distal clavicle was exposed with grade 3 changes.  Lateral centimeter sharply resected.  Adequacy of decompression and clavicle excision confirmed, viewing from all portals.  The instruments and fluid removed.  The portals were closed with Mersilene and injected with Marcaine.  The portals were closed with 4-0 nylon.  A sterile compressive dressing applied with a sling.  Anesthesia reversed and brought to the recovery room.  Tolerated the surgery well with no complications. Dictated by:   Loreta Ave, M.D. Attending Physician:  Colbert Ewing DD:  06/16/01 TD:  06/16/01 Job: [id number]

## 2011-02-06 ENCOUNTER — Ambulatory Visit: Payer: 59 | Admitting: Family Medicine

## 2011-02-06 ENCOUNTER — Ambulatory Visit: Payer: 59 | Admitting: Licensed Clinical Social Worker

## 2011-02-07 ENCOUNTER — Ambulatory Visit (INDEPENDENT_AMBULATORY_CARE_PROVIDER_SITE_OTHER): Payer: 59 | Admitting: Family Medicine

## 2011-02-07 ENCOUNTER — Encounter: Payer: Self-pay | Admitting: Family Medicine

## 2011-02-07 VITALS — BP 120/88 | HR 66 | Temp 98.7°F | Ht 64.0 in | Wt 202.0 lb

## 2011-02-07 DIAGNOSIS — F4320 Adjustment disorder, unspecified: Secondary | ICD-10-CM

## 2011-02-07 NOTE — Progress Notes (Signed)
50 with h/o fibromyalgia and anxiety here to follow up anxiety and to fill out FMLA paperwork.  Saw her last week for adjustment disorder. Was watching her 48 year old great niece when she was bitten by her dog.  Very anxious about this situation.  Cannot concentrate, cannot eat or sleep.  No SI or HI. She has successfully used short acting benzos in past for sever panic attacks.  Started xanax as needed for insomnia and anxiety last week and referred for psychotherapy, feels it is helping a little.  The PMH, PSH, Social History, Family History, Medications, and allergies have been reviewed in [facility], and have been updated if relevant.   Review of Systems       See HPI  Physical Exam BP 120/88  Pulse 66  Temp(Src) 98.7 F (37.1 C) (Oral)  Ht 5\' 4"  (1.626 m)  Wt 202 lb (91.627 kg)  BMI 34.67 kg/m2  LMP 12/20/2010   General:  alert, very tearful Msk:  No deformity or scoliosis noted of thoracic or lumbar spine.   Neurologic:  No cranial nerve deficits noted. Station and gait are normal.  DTRs are symmetrical throughout. Psych:  good eye contact, tearful   A/P- Adjustment disorder- unchanged. >25 min spent with face to face with patient counseling and coordinating care. Forms filled out.

## 2011-02-14 ENCOUNTER — Telehealth: Payer: Self-pay | Admitting: *Deleted

## 2011-02-14 NOTE — Telephone Encounter (Signed)
CSR at insurance company states she has turned pt's disability claim over to a physician review board and she is asking if you would like to participate in the discussion.  The dates she is offering are 6/1 or 6/4.  Please advise if you want to do this and on what date.
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
## 2011-02-15 NOTE — Telephone Encounter (Signed)
Left message on voicemail at [employer] advising them as instructed.

## 2011-03-06 ENCOUNTER — Other Ambulatory Visit: Payer: Self-pay | Admitting: Family Medicine

## 2011-03-15 ENCOUNTER — Telehealth: Payer: Self-pay | Admitting: *Deleted

## 2011-03-15 DIAGNOSIS — IMO0001 Reserved for inherently not codable concepts without codable children: Secondary | ICD-10-CM

## 2011-03-15 MED ORDER — HYDROCODONE-ACETAMINOPHEN 5-500 MG PO TABS: 1.0000 | ORAL_TABLET | Freq: Two times a day (BID) | ORAL | Status: DC

## 2011-03-15 NOTE — Telephone Encounter (Signed)
OK to call in one month supply of vicodin, no refills beyond that.  I do not manage chronic pain. I will place referral to pain clinic.

## 2011-03-15 NOTE — Telephone Encounter (Signed)
Pt sees rheumatologist who suggested she be referred to pain management for fibromyalgia.  Rheumatologist was to have sent that recommendation to you for a referral. Pt has not heard anything and is now out of pain medicine.  She is asking for a referral to someone in [place].  She has been taking vicodin, which she's out of, so she will need a refill on this.  She is asking that you refill it- she doesn't want to ask her rheumatologist because they have basically let her go, [facility] [address].

## 2011-03-16 NOTE — Telephone Encounter (Signed)
Patient advised as instructed via telephone.  Advised that she will be hearing from Marion/Cynthia to schedule appt with pain clinic.

## 2011-03-24 ENCOUNTER — Ambulatory Visit (HOSPITAL_COMMUNITY)
Admission: RE | Admit: 2011-03-24 | Discharge: 2011-03-24 | Disposition: A | Discharge status: Home / Self Care | Payer: 59 | Source: Ambulatory Visit | Attending: Psychiatry | Admitting: Psychiatry

## 2011-03-24 DIAGNOSIS — F39 Unspecified mood [affective] disorder: Secondary | ICD-10-CM | POA: Insufficient documentation

## 2011-03-30 ENCOUNTER — Ambulatory Visit (INDEPENDENT_AMBULATORY_CARE_PROVIDER_SITE_OTHER): Payer: 59 | Admitting: Family Medicine

## 2011-03-30 ENCOUNTER — Encounter: Payer: Self-pay | Admitting: Family Medicine

## 2011-03-30 DIAGNOSIS — F4320 Adjustment disorder, unspecified: Secondary | ICD-10-CM

## 2011-03-30 MED ORDER — ALPRAZOLAM 0.5 MG PO TABS: 0.5000 mg | ORAL_TABLET | Freq: Three times a day (TID) | ORAL | Status: DC | PRN

## 2011-03-30 MED ORDER — SERTRALINE HCL 25 MG PO TABS: 25.0000 mg | ORAL_TABLET | Freq: Every day | ORAL | Status: DC

## 2011-03-30 NOTE — Patient Instructions (Addendum)
Send me papers about your time out of work.  Stay with counseling.  Call me Monday with an update.  Take care.
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
## 2011-04-01 ENCOUNTER — Encounter: Payer: Self-pay | Admitting: Family Medicine

## 2011-04-01 NOTE — Assessment & Plan Note (Addendum)
Vs depression.  Either way, she needs treatment.  She is in counseling and we talked about meds today.  She is willing to try zoloft, since she hadn't been on that prev.  No Si/HI.  Will continue counseling.  She says she is safe at home.  Use xanax in meantime and call back with update.  She agrees.  >25 min spent with face to face with patient, >50% counseling.

## 2011-04-03 ENCOUNTER — Telehealth: Payer: Self-pay | Admitting: Family Medicine

## 2011-04-03 NOTE — Telephone Encounter (Signed)
Please call pt and get an update.  She was going to call back yesterday, but I didn't hear from her.  Thanks.

## 2011-04-03 NOTE — Telephone Encounter (Signed)
Called patient, left message for her to call back with an update.

## 2011-04-04 NOTE — Telephone Encounter (Signed)
Patient says that she is doing about the same, not better, but no worse. She says that she is going to be bringing in the paper work for her time out of work to be filled out. She also has an appt with her counselor next week.

## 2011-04-04 NOTE — Telephone Encounter (Signed)
I want her to schedule a f/u appointment with me or Dr. Dayton Martes, 30 min visit.  Schedule for ~2 weeks.

## 2011-04-04 NOTE — Telephone Encounter (Signed)
Message left advising patient to call and schedule a 30 minute follow up with Dr. Dayton Martes or Dr. Para March.

## 2011-04-05 ENCOUNTER — Telehealth: Payer: Self-pay | Admitting: *Deleted

## 2011-04-05 NOTE — Telephone Encounter (Signed)
Pt has brought in disability forms, these are on your desk.

## 2011-04-05 NOTE — Telephone Encounter (Signed)
I'll address the hard copy.  

## 2011-04-12 ENCOUNTER — Other Ambulatory Visit: Payer: Self-pay | Admitting: *Deleted

## 2011-04-12 MED ORDER — HYDROCODONE-ACETAMINOPHEN 5-500 MG PO TABS: 1.0000 | ORAL_TABLET | Freq: Two times a day (BID) | ORAL | Status: DC

## 2011-04-12 NOTE — Telephone Encounter (Signed)
Last refilled on 03/15/2011.

## 2011-04-12 NOTE — Telephone Encounter (Signed)
Rx called to pharmacy

## 2011-04-19 ENCOUNTER — Encounter: Payer: Self-pay | Admitting: Family Medicine

## 2011-04-19 ENCOUNTER — Ambulatory Visit (INDEPENDENT_AMBULATORY_CARE_PROVIDER_SITE_OTHER): Payer: 59 | Admitting: Family Medicine

## 2011-04-19 DIAGNOSIS — F4320 Adjustment disorder, unspecified: Secondary | ICD-10-CM
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
## 2011-04-19 NOTE — Patient Instructions (Signed)
Call me with an update next week so we can talk about work. Don't change your meds for now.   I want you to call [name] Dare about your appointment.

## 2011-04-19 NOTE — Assessment & Plan Note (Signed)
She is still depressed or symptomatic from adjustment disorder.  Continue SSRI for now.  I would like to inc the dose, but she has taste changes and doesn't want to increase yet.  She has no SI/HI.  I advised her about family services to ask for legal help, should that be needed. Continue with counseling and I'll notify her PMD about her situation.  >25 min spent with face to face with patient, >50% counseling.

## 2011-04-25 ENCOUNTER — Telehealth: Payer: Self-pay | Admitting: *Deleted

## 2011-04-25 NOTE — Telephone Encounter (Signed)
Patient advised as instructed via telephone.  She will get the paper work and call back to schedule 30 minute appt.

## 2011-04-25 NOTE — Telephone Encounter (Signed)
Please have her get a copy of the needed work forms for me to fill out.  I'll complete them then and extender her time out of work.  I'd like to see her back in 2-3 weeks.  30 min visit.  Thanks.

## 2011-04-25 NOTE — Telephone Encounter (Signed)
Patient says you had asked her to call this week to let you know if she was ready to go back to work.  She says she is not.  She is not worse but is not really any better.  She is taking the medication you prescribed.  She just does not feel like she can deal with people.  Please let her know if she needs to bring in additional paperwork.

## 2011-04-27 ENCOUNTER — Telehealth: Payer: Self-pay | Admitting: *Deleted

## 2011-04-27 NOTE — Telephone Encounter (Signed)
The patient made an appt on Aug. 29th as instructed.  She says that her company says they do not send additional paperwork after the original, they just request that the office notes be sent via fax.   [employer] FAX:  [phone]        Please use the code:  [id number] to identify her.

## 2011-04-27 NOTE — Telephone Encounter (Signed)
Last two OV notes faxed to [employer] fax [phone], Code [phone].  Left message on cell phone voicemail advising patient as instructed.

## 2011-04-27 NOTE — Telephone Encounter (Signed)
Please send the recent notes and I'll see her at OV.  Thanks.

## 2011-04-30 ENCOUNTER — Telehealth: Payer: Self-pay | Admitting: *Deleted

## 2011-04-30 NOTE — Telephone Encounter (Signed)
I can't do it Thursday.  It'll need to be Wednesday afternoon.

## 2011-04-30 NOTE — Telephone Encounter (Signed)
Insurance rep would like for you to schedule a time to talk with one of their doctors regarding pt's disability claim- it has been turned over for peer review.  She would like to schedule phone call Wednesday or Thursday.  Please advise.

## 2011-05-01 NOTE — Telephone Encounter (Signed)
Aram Beecham spoke to [employer] Rep Olegario Messier) regarding her request to talk with doctor about patient's disability claim. Olegario Messier has additional questions after reviewing paperwork that was completed for patient. Olegario Messier will take Dr. Lianne Bushy call when he is available to talk with her. See Aram Beecham for additional information.

## 2011-05-03 ENCOUNTER — Telehealth: Payer: Self-pay | Admitting: *Deleted

## 2011-05-03 NOTE — Telephone Encounter (Signed)
Dr. Earlene Plater is asking if you could call her regarding patient's disability for a peer to peer review. She is aware that you are out of the office until Monday.

## 2011-05-03 NOTE — Telephone Encounter (Signed)
Per Dr. Para March, call the ins co.,for the medical review and he will give them a call back. I called the ins co., says the the claim has been sent to peer to peer review. Says they will call the office to schedule or  they may  call back today to speak w/ the physician. Told them if they ask for myself, I would make sure the physician calls them back..Called Dr. Para March with this update..cdavis 05-03-2011

## 2011-05-04 ENCOUNTER — Encounter: Payer: 59 | Attending: Physical Medicine & Rehabilitation | Admitting: Physical Medicine & Rehabilitation

## 2011-05-04 DIAGNOSIS — M545 Low back pain, unspecified: Secondary | ICD-10-CM

## 2011-05-04 DIAGNOSIS — M76899 Other specified enthesopathies of unspecified lower limb, excluding foot: Secondary | ICD-10-CM

## 2011-05-04 DIAGNOSIS — IMO0001 Reserved for inherently not codable concepts without codable children: Secondary | ICD-10-CM | POA: Insufficient documentation

## 2011-05-04 DIAGNOSIS — Z79899 Other long term (current) drug therapy: Secondary | ICD-10-CM | POA: Insufficient documentation

## 2011-05-04 DIAGNOSIS — F341 Dysthymic disorder: Secondary | ICD-10-CM

## 2011-05-04 DIAGNOSIS — Z981 Arthrodesis status: Secondary | ICD-10-CM | POA: Insufficient documentation

## 2011-05-04 DIAGNOSIS — E669 Obesity, unspecified: Secondary | ICD-10-CM | POA: Insufficient documentation

## 2011-05-04 DIAGNOSIS — G4700 Insomnia, unspecified: Secondary | ICD-10-CM | POA: Insufficient documentation

## 2011-05-06 NOTE — Telephone Encounter (Signed)
What is the status on this?

## 2011-05-06 NOTE — Telephone Encounter (Signed)
See following note. 

## 2011-05-07 NOTE — Telephone Encounter (Signed)
Noted  

## 2011-05-07 NOTE — Telephone Encounter (Signed)
Waiting on call back from the ins company for a peer to peer w/ you and their physical...cdavis 05-07-2011

## 2011-05-09 ENCOUNTER — Other Ambulatory Visit: Payer: Self-pay | Admitting: *Deleted

## 2011-05-09 MED ORDER — HYDROCODONE-ACETAMINOPHEN 5-500 MG PO TABS: 1.0000 | ORAL_TABLET | Freq: Two times a day (BID) | ORAL | Status: DC

## 2011-05-09 NOTE — Telephone Encounter (Signed)
Rx called to [facility]. 

## 2011-05-09 NOTE — Telephone Encounter (Signed)
Last refilled 04/12/2011.

## 2011-05-10 ENCOUNTER — Telehealth: Payer: Self-pay | Admitting: *Deleted

## 2011-05-10 NOTE — Telephone Encounter (Signed)
Faxed to number listed in note.

## 2011-05-10 NOTE — Telephone Encounter (Signed)
Pt's disability claim was denied because there were no clear objectives listed, according to the patient. Her insurance company told her that what was on the form was basically what she had told you, but not what your opinion is. They want you to write a letter giving your observations as to why she cant perform her job duties.  Letter needs to be faxed to [employer] disability- [place], fax [phone], last fax you sent included Code [phone].  Pt didn't say whether this code needed to be used, or not.

## 2011-05-10 NOTE — Telephone Encounter (Signed)
Please fax in.

## 2011-05-16 ENCOUNTER — Ambulatory Visit: Payer: 59 | Admitting: Family Medicine

## 2011-05-16 DIAGNOSIS — Z029 Encounter for administrative examinations, unspecified: Secondary | ICD-10-CM

## 2011-06-06 ENCOUNTER — Encounter: Payer: Self-pay | Attending: Physical Medicine & Rehabilitation | Admitting: Physical Medicine & Rehabilitation

## 2011-06-06 ENCOUNTER — Other Ambulatory Visit: Payer: Self-pay | Admitting: *Deleted

## 2011-06-06 NOTE — Telephone Encounter (Signed)
Please call pt to find out why she is requesting vicodin.

## 2011-06-07 NOTE — Telephone Encounter (Signed)
Left message on cell phone voicemail for patient to return call. 

## 2011-06-08 NOTE — Telephone Encounter (Signed)
Left message on cell phone voicemail for patient to return call. 

## 2011-06-11 ENCOUNTER — Other Ambulatory Visit: Payer: Self-pay | Admitting: Family Medicine

## 2011-06-11 NOTE — Telephone Encounter (Signed)
Left message on cell phone voicemail for patient to return call.  Advised pharmacy that Rx is being refused until we hear back from patient.

## 2011-07-18 ENCOUNTER — Emergency Department (HOSPITAL_COMMUNITY): Payer: Self-pay

## 2011-07-18 ENCOUNTER — Encounter (HOSPITAL_COMMUNITY): Payer: Self-pay | Admitting: Radiology

## 2011-07-18 ENCOUNTER — Inpatient Hospital Stay (HOSPITAL_COMMUNITY)
Admission: EM | Admit: 2011-07-18 | Discharge: 2011-07-20 | DRG: 690 | Disposition: A | Discharge status: Home / Self Care | Payer: Self-pay | Attending: Internal Medicine | Admitting: Internal Medicine

## 2011-07-18 DIAGNOSIS — R197 Diarrhea, unspecified: Secondary | ICD-10-CM | POA: Diagnosis present

## 2011-07-18 DIAGNOSIS — IMO0001 Reserved for inherently not codable concepts without codable children: Secondary | ICD-10-CM | POA: Diagnosis present

## 2011-07-18 DIAGNOSIS — Z79899 Other long term (current) drug therapy: Secondary | ICD-10-CM

## 2011-07-18 DIAGNOSIS — E876 Hypokalemia: Secondary | ICD-10-CM | POA: Diagnosis present

## 2011-07-18 DIAGNOSIS — E669 Obesity, unspecified: Secondary | ICD-10-CM | POA: Diagnosis present

## 2011-07-18 DIAGNOSIS — T502X5A Adverse effect of carbonic-anhydrase inhibitors, benzothiadiazides and other diuretics, initial encounter: Secondary | ICD-10-CM | POA: Diagnosis present

## 2011-07-18 DIAGNOSIS — I10 Essential (primary) hypertension: Secondary | ICD-10-CM | POA: Diagnosis present

## 2011-07-18 DIAGNOSIS — F17200 Nicotine dependence, unspecified, uncomplicated: Secondary | ICD-10-CM | POA: Diagnosis present

## 2011-07-18 DIAGNOSIS — Z981 Arthrodesis status: Secondary | ICD-10-CM

## 2011-07-18 DIAGNOSIS — D72829 Elevated white blood cell count, unspecified: Secondary | ICD-10-CM | POA: Diagnosis present

## 2011-07-18 DIAGNOSIS — E860 Dehydration: Secondary | ICD-10-CM | POA: Diagnosis present

## 2011-07-18 DIAGNOSIS — Z23 Encounter for immunization: Secondary | ICD-10-CM

## 2011-07-18 DIAGNOSIS — N10 Acute tubulo-interstitial nephritis: Principal | ICD-10-CM | POA: Diagnosis present

## 2011-07-18 DIAGNOSIS — N179 Acute kidney failure, unspecified: Secondary | ICD-10-CM | POA: Diagnosis present

## 2011-07-18 HISTORY — DX: Essential (primary) hypertension: I10

## 2011-07-18 LAB — BASIC METABOLIC PANEL WITH GFR
BUN: 29 mg/dL — ABNORMAL HIGH (ref 6–23)
CO2: 24 meq/L (ref 19–32)
Calcium: 8.3 mg/dL — ABNORMAL LOW (ref 8.4–10.5)
Chloride: 94 meq/L — ABNORMAL LOW (ref 96–112)
Creatinine, Ser: 1.82 mg/dL — ABNORMAL HIGH (ref 0.50–1.10)
GFR calc Af Amer: 37 mL/min — ABNORMAL LOW (ref >90)
GFR calc non Af Amer: 32 mL/min — ABNORMAL LOW (ref >90)
Glucose, Bld: 105 mg/dL — ABNORMAL HIGH (ref 70–99)
Potassium: 3.2 meq/L — ABNORMAL LOW (ref 3.5–5.1)
Sodium: 130 meq/L — ABNORMAL LOW (ref 135–145)

## 2011-07-18 LAB — CBC
HCT: 34.8 % — ABNORMAL LOW (ref 36.0–46.0)
Hemoglobin: 12.3 g/dL (ref 12.0–15.0)
MCH: 29.9 pg (ref 26.0–34.0)
MCHC: 35.3 g/dL (ref 30.0–36.0)
MCV: 84.7 fL (ref 78.0–100.0)
Platelets: 194 10*3/uL (ref 150–400)
RBC: 4.11 MIL/uL (ref 3.87–5.11)
RDW: 12.1 % (ref 11.5–15.5)
WBC: 11.2 10*3/uL — ABNORMAL HIGH (ref 4.0–10.5)

## 2011-07-18 LAB — DIFFERENTIAL
Basophils Absolute: 0 10*3/uL (ref 0.0–0.1)
Basophils Relative: 0 % (ref 0–1)
Eosinophils Absolute: 0 10*3/uL (ref 0.0–0.7)
Eosinophils Relative: 0 % (ref 0–5)
Lymphocytes Relative: 6 % — ABNORMAL LOW (ref 12–46)
Lymphs Abs: 0.7 10*3/uL (ref 0.7–4.0)
Monocytes Absolute: 1.1 10*3/uL — ABNORMAL HIGH (ref 0.1–1.0)
Monocytes Relative: 10 % (ref 3–12)
Neutro Abs: 9.4 10*3/uL — ABNORMAL HIGH (ref 1.7–7.7)
Neutrophils Relative %: 84 % — ABNORMAL HIGH (ref 43–77)

## 2011-07-18 LAB — COMPREHENSIVE METABOLIC PANEL
ALT: 20 U/L (ref 0–35)
AST: 24 U/L (ref 0–37)
Albumin: 2.7 g/dL — ABNORMAL LOW (ref 3.5–5.2)
Alkaline Phosphatase: 68 U/L (ref 39–117)
BUN: 35 mg/dL — ABNORMAL HIGH (ref 6–23)
CO2: 23 mEq/L (ref 19–32)
Calcium: 7.9 mg/dL — ABNORMAL LOW (ref 8.4–10.5)
Chloride: 90 mEq/L — ABNORMAL LOW (ref 96–112)
Creatinine, Ser: 1.78 mg/dL — ABNORMAL HIGH (ref 0.50–1.10)
GFR calc Af Amer: 38 mL/min — ABNORMAL LOW (ref >90)
GFR calc non Af Amer: 33 mL/min — ABNORMAL LOW (ref >90)
Glucose, Bld: 149 mg/dL — ABNORMAL HIGH (ref 70–99)
Potassium: 2.1 mEq/L — CL (ref 3.5–5.1)
Sodium: 128 mEq/L — ABNORMAL LOW (ref 135–145)
Total Bilirubin: 0.5 mg/dL (ref 0.3–1.2)
Total Protein: 6.8 g/dL (ref 6.0–8.3)

## 2011-07-18 LAB — BASIC METABOLIC PANEL
BUN: 33 mg/dL — ABNORMAL HIGH (ref 6–23)
CO2: 27 mEq/L (ref 19–32)
Calcium: 8.2 mg/dL — ABNORMAL LOW (ref 8.4–10.5)
Chloride: 89 mEq/L — ABNORMAL LOW (ref 96–112)
Creatinine, Ser: 2 mg/dL — ABNORMAL HIGH (ref 0.50–1.10)
GFR calc Af Amer: 33 mL/min — ABNORMAL LOW (ref >90)
GFR calc non Af Amer: 29 mL/min — ABNORMAL LOW (ref >90)
Glucose, Bld: 126 mg/dL — ABNORMAL HIGH (ref 70–99)
Potassium: 2.6 mEq/L — CL (ref 3.5–5.1)
Sodium: 129 mEq/L — ABNORMAL LOW (ref 135–145)

## 2011-07-18 LAB — URINALYSIS, ROUTINE W REFLEX MICROSCOPIC
Bilirubin Urine: NEGATIVE
Glucose, UA: NEGATIVE mg/dL
Ketones, ur: NEGATIVE mg/dL
Nitrite: NEGATIVE
Protein, ur: 30 mg/dL — AB
Specific Gravity, Urine: 1.005 (ref 1.005–1.030)
Urobilinogen, UA: 0.2 mg/dL (ref 0.0–1.0)
pH: 6 (ref 5.0–8.0)

## 2011-07-18 LAB — URINE MICROSCOPIC-ADD ON

## 2011-07-18 LAB — LIPASE, BLOOD: Lipase: 17 U/L (ref 11–59)

## 2011-07-18 MED ORDER — IOHEXOL 300 MG/ML  SOLN
80.0000 mL | Freq: Once | INTRAMUSCULAR | Status: AC | PRN
  Administered 2011-07-18: 80 mL via INTRAVENOUS

## 2011-07-19 LAB — BASIC METABOLIC PANEL
BUN: 18 mg/dL (ref 6–23)
Creatinine, Ser: 1.5 mg/dL — ABNORMAL HIGH (ref 0.50–1.10)
GFR calc non Af Amer: 40 mL/min — ABNORMAL LOW (ref >90)
Glucose, Bld: 125 mg/dL — ABNORMAL HIGH (ref 70–99)
Potassium: 3 mEq/L — ABNORMAL LOW (ref 3.5–5.1)

## 2011-07-19 LAB — CBC
HCT: 32 % — ABNORMAL LOW (ref 36.0–46.0)
Hemoglobin: 11 g/dL — ABNORMAL LOW (ref 12.0–15.0)
MCHC: 34.4 g/dL (ref 30.0–36.0)
MCV: 86.7 fL (ref 78.0–100.0)
RDW: 12.5 % (ref 11.5–15.5)

## 2011-07-19 LAB — URINE CULTURE
Colony Count: >=100000
Culture  Setup Time: 201210310744

## 2011-07-19 NOTE — H&P (Signed)
Stacy [name], WHINERY NO.:  [id number]  MEDICAL RECORD NO.:  [id number]  LOCATION:  [facility]                         FACILITY:  [facility]  PHYSICIAN:  Osvaldo Shipper, MD     DATE OF BIRTH:  [birth year]  DATE OF ADMISSION:  07/18/2011 DATE OF DISCHARGE:                             HISTORY & PHYSICAL   FAMILY CARE PHYSICIAN:  Ruthe Mannan, MD with [facility] in [place].  ADMISSION DIAGNOSES: 1. Acute pyelonephritis. 2. Acute renal failure. 3. Severe hypokalemia. 4. Dehydration. 5. History of hypertension. 6. History of fibromyalgia.  CHIEF COMPLAINT:  Nausea, vomiting, dizziness, chills since Saturday.  HISTORY OF PRESENT ILLNESS:  The patient is a 50 year old Caucasian female who is also obese, has history of fibromyalgia and hypertension who was in her usual state of health until about Friday or Saturday when she started having chills and nausea.  This was followed by onset of abdominal pains and cramps.  She had fever, however, she did not check her temperature.  She had sweating, freezing sensation.  She had chills. Then she started having diarrhea with loose stools.  She has not had any diarrhea since midnight.  She has been having lot of dry heaves with clear emesis at times.  She has not thrown up since yesterday morning. She was actually in [place] when all this started and she drove back home with continued symptoms en route.  She has never had these symptoms in the past.  She denies any urinary symptoms per se no blood in the urine.  She tried taking Imodium over the counter nausea medicine and ibuprofen with only partial relief.  MEDICATIONS AT HOME: 1. The over-the-counter nausea medicine, the name of which she does     not know. 2. Imodium as needed for diarrhea. 3. Ibuprofen 200 mg 1-2 tablets every 4 hours as needed. 4. Hydrochlorothiazide 25 mg 1 tablet daily.  ALLERGIES:  ERYTHROMYCIN and NAPROSYN.  PAST MEDICAL HISTORY:  Positive for  fibromyalgia, hypertension, mitral valve repair, tubal ligation, rotator cuff repair and fusion surgery. Denies any history of coronary artery disease.  SOCIAL HISTORY:  She lives in [facility], currently unemployed, smokes half pack of cigarettes every month.  No alcohol use.  No illicit drug use.  FAMILY HISTORY:  She is adopted.  REVIEW OF SYSTEMS:  GENERAL SYSTEM:  Positive for weakness, malaise. HEENT unremarkable.  CARDIOVASCULAR:  Unremarkable.  RESPIRATORY: Unremarkable.  GI:  As in HPI.  GU: As in HPI.  NEUROLOGIC: Unremarkable.  PSYCHIATRIC:  Unremarkable.  DERMATOLOGICAL: Unremarkable.  Other systems reviewed and found to be negative.  PHYSICAL EXAMINATION:  VITAL SIGNS:  Temperature 99.2, currently blood pressure 122/87, heart rate in the 80s, respiratory rate is 18, saturation 95-99% on room air. GENERAL:  This is an obese white female, in no distress, appears to be in some discomfort because of the chills. HEENT:  Head is normocephalic, atraumatic.  Pupils are equal, reacting. No pallor.  No icterus.  Oral mucous membranes slightly dry.  No oral lesions are noted. NECK:  Soft and supple.  No thyromegaly is appreciated. LUNGS:  A few crackles at the bases, which diminished with deep breathing.  CARDIOVASCULAR:  S1, S2 is normal.  Regular.  No S3, S4, rubs, murmurs, or bruits. ABDOMEN:  Soft, really nontender, nondistended.  Bowel sounds are present.  No masses or organomegaly is appreciated.  No CVA tenderness was present. GU:  Deferred. MUSCULOSKELETAL:  Normal muscle mass and tone. NEUROLOGIC:  She is alert, oriented x3.  No focal neurological deficits are present. SKIN:  Does not reveal any rashes.  LABORATORY DATA:  Her white cell count is 11.2 with 84% neutrophils, hemoglobin is still 12.3, platelet count is 194.  Sodium is 128, potassium is 2.1, chloride is 90, bicarb is 23, glucose 149, BUN is 35, creatinine 1.78.  LFTs are all normal.  Albumin is 2.7,  lipase is 17. Total protein is 6.8.  UA shows cloudy urine, moderate blood, large leukocytes, many squamous epithelial cells, numerous wbc's, 7-10 rbc's, many bacteria.  Urine cultures have not been sent yet.  IMAGING STUDIES:  She had a CT of her abdomen and pelvis which showed findings suggestive of pyelonephritis, especially in the right kidney. Rest of the organs were normal.  Atelectasis was seen in the lung bases. EKG was done, which shows sinus rhythm at 91 beats per minute with normal axis, intervals appear to be in the normal range.  No concerning Q-waves.  No concerning  ST changes.  There is T inversion in lead III. There is an old EKG which does not show these findings.  ASSESSMENT:  This is a 50 year old Caucasian female with obesity, hypertension, fibromyalgia who presents with fever chills, nausea, vomiting, flank pain, cramping of her abdomen and is found to have abnormal UA suggestive of acute pyelonephritis.  This is also seen on the CT scan.  She also has acute renal failure, hypokalemia and dehydration.  PLAN: 1. Acute pyelonephritis, will be treated with ceftriaxone.  Urine will     be sent off for cultures. 2. Severe hypokalemia, will be repleted orally and intravenously.     Repeat labs will be checked later today to make sure potassium is     headed in the right direction. 3. Acute renal failure, will be treated with IV fluids and should     improve. 4. Diarrhea probably related to her acute illness.  We will continue     to monitor her for now.  If diarrhea gets worse, further evaluation     may be necessary. 5. DVT prophylaxis will be given. 6. The patient is a full code. 7. Further management decisions will depend on results of further     testing and patient's response to treatment.     Osvaldo Shipper, MD     GK/MEDQ  D:  07/18/2011  T:  07/18/2011  Job:  [id number]  cc:   Ruthe Mannan, M.D.  Electronically Signed by Osvaldo Shipper MD on  07/19/2011 07:40:48 PM

## 2011-07-20 LAB — CBC
HCT: 29.7 % — ABNORMAL LOW (ref 36.0–46.0)
Hemoglobin: 10.1 g/dL — ABNORMAL LOW (ref 12.0–15.0)
MCH: 30.1 pg (ref 26.0–34.0)
MCHC: 34 g/dL (ref 30.0–36.0)
MCV: 88.4 fL (ref 78.0–100.0)
Platelets: 225 10*3/uL (ref 150–400)
RBC: 3.36 MIL/uL — ABNORMAL LOW (ref 3.87–5.11)
RDW: 12.7 % (ref 11.5–15.5)
WBC: 11.4 10*3/uL — ABNORMAL HIGH (ref 4.0–10.5)

## 2011-07-20 LAB — BASIC METABOLIC PANEL
BUN: 13 mg/dL (ref 6–23)
CO2: 23 mEq/L (ref 19–32)
Calcium: 8.5 mg/dL (ref 8.4–10.5)
Chloride: 101 mEq/L (ref 96–112)
Creatinine, Ser: 1.36 mg/dL — ABNORMAL HIGH (ref 0.50–1.10)
GFR calc Af Amer: 53 mL/min — ABNORMAL LOW (ref >90)
GFR calc non Af Amer: 46 mL/min — ABNORMAL LOW (ref >90)
Glucose, Bld: 162 mg/dL — ABNORMAL HIGH (ref 70–99)
Potassium: 3.4 mEq/L — ABNORMAL LOW (ref 3.5–5.1)
Sodium: 136 mEq/L (ref 135–145)

## 2011-07-21 NOTE — Discharge Summary (Signed)
  NAMEMERRIAM, BRANDNER                 ACCOUNT NO.:  [id number]  MEDICAL RECORD NO.:  [id number]  LOCATION:  5506                         FACILITY:  [facility]  PHYSICIAN:  Lonia Blood, M.D.       DATE OF BIRTH:  11-Dec-1960  DATE OF ADMISSION:  07/18/2011 DATE OF DISCHARGE:  07/20/2011                              DISCHARGE SUMMARY   PRIMARY CARE PHYSICIAN:  Ruthe Mannan, MD  DISCHARGE DIAGNOSES: 1. Acute pyelonephritis. 2. Hypokalemia resolved. 3. Acute renal failure, resolved. 4. Dehydration resolved. 5. Hypertension.  DISCHARGE MEDICATIONS: 1. Cefuroxime 500 mg by mouth twice a day for 1 week. 2. Ibuprofen 200 mg 1-2 tablets every 4 hours as needed for fever. 3. Oxycodone 5 mg every 4 hours as needed for severe pain.  CONDITION ON DISCHARGE:  Stacy [name] was discharged in good condition, alert, oriented, in no acute distress, afebrile.  Able to tolerate a regular diet.  The patient will follow up with her primary care physician for further needs.  CONSULTATION:  Consultation obtained.  PROCEDURES:  The patient underwent CT scan abdomen and pelvis on July 18, 2011, showing abnormal appearance of the renal nephrograms bilaterally suggesting pyelonephritis, especially in the right kidney.  HISTORY AND PHYSICAL:  Refer to dictated H and P done by Dr. Osvaldo Shipper.  HOSPITAL COURSE:  [name] is a 50 year old woman with history of hypertension on HCTZ, who was admitted to the hospital with fever, leukocytosis, back pain and polyuria.  She was diagnosed with acute pyelonephritis and placed on intravenous fluids and intravenous antibiotics.  Upon admission, she was also found to be severely hypokalemic with a potassium level of 2.1.  Throughout this admission, the patient was treated with intravenous Rocephin and IV fluids with bicarbonate in them.  She also received repeat doses of oral potassium. The patient improved during this hospitalization, became afebrile and her back  pain got better.  Unfortunately, the urine culture was never sent to the emergency room, but we safely assume that cephalosporin would be useful in continuing at home to complete 10 days of antibiotics.  Otherwise, the patient will be taken off of HCTZ until she sees her primary care physician.  Her hyperkalemia was treated during this hospitalization and discharge potassium level was 3.4.  Acute renal insufficiency also resolved with a discharge creatinine being at 1.3.     Lonia Blood, M.D.     SL/MEDQ  D:  07/20/2011  T:  07/20/2011  Job:  [id number]  cc:   Ruthe Mannan, M.D.  Electronically Signed by Lonia Blood M.D. on 07/21/2011 05:44:36 PM

## 2011-07-24 LAB — CULTURE, BLOOD (ROUTINE X 2)
Culture  Setup Time: 201210311715
Culture: NO GROWTH

## 2011-07-26 ENCOUNTER — Inpatient Hospital Stay (HOSPITAL_COMMUNITY)
Admission: EM | Admit: 2011-07-26 | Discharge: 2011-07-30 | DRG: 690 | Disposition: A | Discharge status: Home / Self Care | Payer: Self-pay | Attending: Internal Medicine | Admitting: Internal Medicine

## 2011-07-26 DIAGNOSIS — Z8679 Personal history of other diseases of the circulatory system: Secondary | ICD-10-CM

## 2011-07-26 DIAGNOSIS — R109 Unspecified abdominal pain: Secondary | ICD-10-CM | POA: Diagnosis present

## 2011-07-26 DIAGNOSIS — D649 Anemia, unspecified: Secondary | ICD-10-CM | POA: Diagnosis present

## 2011-07-26 DIAGNOSIS — F3289 Other specified depressive episodes: Secondary | ICD-10-CM | POA: Diagnosis present

## 2011-07-26 DIAGNOSIS — I10 Essential (primary) hypertension: Secondary | ICD-10-CM | POA: Diagnosis present

## 2011-07-26 DIAGNOSIS — E876 Hypokalemia: Secondary | ICD-10-CM | POA: Diagnosis not present

## 2011-07-26 DIAGNOSIS — IMO0001 Reserved for inherently not codable concepts without codable children: Secondary | ICD-10-CM | POA: Diagnosis present

## 2011-07-26 DIAGNOSIS — R112 Nausea with vomiting, unspecified: Secondary | ICD-10-CM | POA: Diagnosis present

## 2011-07-26 DIAGNOSIS — E871 Hypo-osmolality and hyponatremia: Secondary | ICD-10-CM | POA: Diagnosis present

## 2011-07-26 DIAGNOSIS — F329 Major depressive disorder, single episode, unspecified: Secondary | ICD-10-CM | POA: Diagnosis present

## 2011-07-26 DIAGNOSIS — F17200 Nicotine dependence, unspecified, uncomplicated: Secondary | ICD-10-CM | POA: Diagnosis present

## 2011-07-26 DIAGNOSIS — D72829 Elevated white blood cell count, unspecified: Secondary | ICD-10-CM | POA: Diagnosis present

## 2011-07-26 DIAGNOSIS — N12 Tubulo-interstitial nephritis, not specified as acute or chronic: Principal | ICD-10-CM | POA: Diagnosis present

## 2011-07-26 LAB — DIFFERENTIAL
Eosinophils Absolute: 0.1 10*3/uL (ref 0.0–0.7)
Eosinophils Relative: 1 % (ref 0–5)
Lymphs Abs: 1.8 10*3/uL (ref 0.7–4.0)
Monocytes Absolute: 1.1 10*3/uL — ABNORMAL HIGH (ref 0.1–1.0)
Monocytes Relative: 7 % (ref 3–12)

## 2011-07-26 LAB — COMPREHENSIVE METABOLIC PANEL
ALT: 17 U/L (ref 0–35)
AST: 11 U/L (ref 0–37)
Albumin: 2.8 g/dL — ABNORMAL LOW (ref 3.5–5.2)
Chloride: 109 mEq/L (ref 96–112)
Creatinine, Ser: 0.86 mg/dL (ref 0.50–1.10)
Potassium: 4.3 mEq/L (ref 3.5–5.1)
Sodium: 147 mEq/L — ABNORMAL HIGH (ref 135–145)
Total Bilirubin: 0.2 mg/dL — ABNORMAL LOW (ref 0.3–1.2)

## 2011-07-26 LAB — CBC
MCH: 30.4 pg (ref 26.0–34.0)
MCV: 90.1 fL (ref 78.0–100.0)
Platelets: 540 10*3/uL — ABNORMAL HIGH (ref 150–400)
RBC: 3.52 MIL/uL — ABNORMAL LOW (ref 3.87–5.11)

## 2011-07-26 LAB — URINALYSIS, ROUTINE W REFLEX MICROSCOPIC
Ketones, ur: NEGATIVE mg/dL
Nitrite: NEGATIVE
Specific Gravity, Urine: 1.004 — ABNORMAL LOW (ref 1.005–1.030)
pH: 6 (ref 5.0–8.0)

## 2011-07-26 LAB — URINE MICROSCOPIC-ADD ON

## 2011-07-26 MED ORDER — CEFTRIAXONE SODIUM 1 G IJ SOLR
1.0000 g | Freq: Once | INTRAMUSCULAR | Status: AC
  Administered 2011-07-26: 1 g via INTRAMUSCULAR
  Filled 2011-07-26: qty 10

## 2011-07-26 MED ORDER — MORPHINE SULFATE 2 MG/ML IJ SOLN
2.0000 mg | Freq: Once | INTRAMUSCULAR | Status: AC
  Administered 2011-07-26: 2 mg via INTRAVENOUS
  Filled 2011-07-26: qty 1

## 2011-07-26 MED ORDER — LIDOCAINE HCL (PF) 1 % IJ SOLN
INTRAMUSCULAR | Status: AC
  Administered 2011-07-26
  Filled 2011-07-26: qty 5

## 2011-07-26 NOTE — ED Notes (Signed)
PT. REPORTS PERSISTENT LOW BACK PAIN WITH NAUSEA X 1 WEEK ,DENIES INJURY OR FALL , DISCHARGED HERE LAST Friday - DIAGNOSED WITH PYELONEPHRITIS , CURRENTLY ON ORAL ANTIBIOTIC.

## 2011-07-26 NOTE — ED Provider Notes (Signed)
History     CSN: [id number] Arrival date & time: 07/26/2011  9:35 PM   First MD Initiated Contact with Patient 07/26/11 2215      Chief Complaint  Patient presents with  . Back Pain    (Consider location/radiation/quality/duration/timing/severity/associated sxs/prior treatment) Patient is a 50 y.o. female presenting with flank pain and fever. The history is provided by the patient.  Flank Pain This is a recurrent problem. The current episode started in the past 7 days. The problem occurs constantly. The problem has been unchanged. Associated symptoms include a fever, nausea, urinary symptoms and vomiting. Pertinent negatives include no abdominal pain, change in bowel habit, chest pain, coughing or headaches. The symptoms are aggravated by drinking (Pt states that she has finally stopped throwing up  but now that every time she eats/drinks anything, it tastes like salt). She has tried rest, sleep, relaxation, lying down and NSAIDs for the symptoms. The treatment provided no relief.  Fever Primary symptoms of the febrile illness include fever, nausea, vomiting and dysuria. Primary symptoms do not include headaches, cough, shortness of breath, abdominal pain or diarrhea. The current episode started more than 1 week ago. This is a recurrent problem. The problem has not changed since onset. The fever began 6 to 7 days ago. The fever has been gradually improving since its onset. The maximum temperature recorded prior to her arrival was unknown (hot and then chills).  Nausea began more than 1 week ago. The nausea is associated with missed meals.  The vomiting began more than 2 days ago. Frequency: resolved, patient has not thrown up today.  The dysuria began more than 1 week ago. The discomfort is mild.    Past Medical History  Diagnosis Date  . Fibromyalgia   . Depression   . Hypertension     Past Surgical History  Procedure Date  . Cardiac surgery     MVP 1970    No family history on  file.  History  Substance Use Topics  . Smoking status: Current Everyday Smoker  . Smokeless tobacco: Not on file  . Alcohol Use: Not on file    OB History    Grav Para Term Preterm Abortions TAB SAB Ect Mult Living                  Review of Systems  Constitutional: Positive for fever.  Respiratory: Negative for cough and shortness of breath.   Cardiovascular: Negative for chest pain.  Gastrointestinal: Positive for nausea and vomiting. Negative for abdominal pain, diarrhea and change in bowel habit.  Genitourinary: Positive for dysuria and flank pain.  Musculoskeletal: Positive for back pain (CVA bilaterally).  Neurological: Negative for headaches.  All other systems reviewed and are negative.    Allergies  Erythromycin; Tramadol hcl; and Naproxen  Home Medications   Current Outpatient Rx  Name Route Sig Dispense Refill  . CEFUROXIME AXETIL 500 MG PO TABS Oral Take 500 mg by mouth 2 (two) times daily.      Marland Kitchen HYDROCHLOROTHIAZIDE 25 MG PO TABS Oral Take 25 mg by mouth daily.     . IBUPROFEN 200 MG PO TABS Oral Take 200 mg by mouth every 6 (six) hours as needed. For pain    . LOPERAMIDE HCL 2 MG PO CAPS Oral Take 2 mg by mouth daily.      Marland Kitchen OVER THE COUNTER MEDICATION Oral Take 10 mLs by mouth every 6 (six) hours as needed. For nausea. Over The Counter Nausea Medication  BP 151/83  Pulse 111  Temp(Src) 98.5 F (36.9 C) (Oral)  Resp 20  SpO2 98%  LMP 12/20/2010  Physical Exam  Nursing note and vitals reviewed. Constitutional: She is oriented to person, place, and time. She appears well-developed and well-nourished. She appears distressed.  HENT:  Head: Normocephalic and atraumatic.  Eyes: EOM are normal. Pupils are equal, round, and reactive to light.  Neck: Normal range of motion.  Cardiovascular: Normal rate and normal heart sounds.   Pulmonary/Chest: Effort normal and breath sounds normal. No respiratory distress.  Abdominal: Soft. She exhibits no  distension. There is no tenderness. There is CVA tenderness (bilateral).  Musculoskeletal: Normal range of motion.  Neurological: She is alert and oriented to person, place, and time.  Skin: Skin is warm and dry.    ED Course  Procedures (including critical care time)  Results for orders placed during the hospital encounter of 07/26/11  URINALYSIS, ROUTINE W REFLEX MICROSCOPIC      Component Value Range   Color, Urine YELLOW  YELLOW    Appearance CLEAR  CLEAR    Specific Gravity, Urine 1.004 (*) 1.005 - 1.030    pH 6.0  5.0 - 8.0    Glucose, UA NEGATIVE  NEGATIVE (mg/dL)   Hgb urine dipstick TRACE (*) NEGATIVE    Bilirubin Urine NEGATIVE  NEGATIVE    Ketones, ur NEGATIVE  NEGATIVE (mg/dL)   Protein, ur NEGATIVE  NEGATIVE (mg/dL)   Urobilinogen, UA 0.2  0.0 - 1.0 (mg/dL)   Nitrite NEGATIVE  NEGATIVE    Leukocytes, UA SMALL (*) NEGATIVE   COMPREHENSIVE METABOLIC PANEL      Component Value Range   Sodium 147 (*) 135 - 145 (mEq/L)   Potassium 4.3  3.5 - 5.1 (mEq/L)   Chloride 109  96 - 112 (mEq/L)   CO2 25  19 - 32 (mEq/L)   Glucose, Bld 123 (*) 70 - 99 (mg/dL)   BUN 7  6 - 23 (mg/dL)   Creatinine, Ser 1.19  0.50 - 1.10 (mg/dL)   Calcium 9.3  8.4 - 14.7 (mg/dL)   Total Protein 6.8  6.0 - 8.3 (g/dL)   Albumin 2.8 (*) 3.5 - 5.2 (g/dL)   AST 11  0 - 37 (U/L)   ALT 17  0 - 35 (U/L)   Alkaline Phosphatase 61  39 - 117 (U/L)   Total Bilirubin 0.2 (*) 0.3 - 1.2 (mg/dL)   GFR calc non Af Amer 79 (*) >90 (mL/min)   GFR calc Af Amer >90  >90 (mL/min)  CBC      Component Value Range   WBC 15.6 (*) 4.0 - 10.5 (K/uL)   RBC 3.52 (*) 3.87 - 5.11 (MIL/uL)   Hemoglobin 10.7 (*) 12.0 - 15.0 (g/dL)   HCT 82.9 (*) 56.2 - 46.0 (%)   MCV 90.1  78.0 - 100.0 (fL)   MCH 30.4  26.0 - 34.0 (pg)   MCHC 33.8  30.0 - 36.0 (g/dL)   RDW 13.0  86.5 - 78.4 (%)   Platelets 540 (*) 150 - 400 (K/uL)  DIFFERENTIAL      Component Value Range   Neutrophils Relative 80 (*) 43 - 77 (%)   Neutro Abs 12.5  (*) 1.7 - 7.7 (K/uL)   Lymphocytes Relative 12  12 - 46 (%)   Lymphs Abs 1.8  0.7 - 4.0 (K/uL)   Monocytes Relative 7  3 - 12 (%)   Monocytes Absolute 1.1 (*) 0.1 - 1.0 (K/uL)  Eosinophils Relative 1  0 - 5 (%)   Eosinophils Absolute 0.1  0.0 - 0.7 (K/uL)   Basophils Relative 0  0 - 1 (%)   Basophils Absolute 0.1  0.0 - 0.1 (K/uL)  URINE MICROSCOPIC-ADD ON      Component Value Range   Squamous Epithelial / LPF FEW (*) RARE    WBC, UA 3-6  <3 (WBC/hpf)   RBC / HPF 3-6  <3 (RBC/hpf)   Bacteria, UA RARE  RARE     No results found.   1. Pyelonephritis       MDM  10:17 PM Pt seen and examined. Briefly pt was admitted 10/31 for pyelo. She felt better when she was in the hospital, but once she got home, she began to have increased CVA pain, nausea, vomiting, fever and chills. Concern for undertreated infection. PT with CT evidence of kidney dysfunction, so will avoid renally cleared pain meds and stick to narcotics. Will recheck urine and labs.  10:46 PM PT with elevated WBC. Pt will need to be re-admitted for failure of outpatient management. She will be admitted to [facility].  Daleen Bo 07/27/11 0007

## 2011-07-26 NOTE — ED Provider Notes (Signed)
History     CSN: [id number] Arrival date & time: 07/26/2011  9:35 PM   First MD Initiated Contact with Patient 07/26/11 2215      Chief Complaint  Patient presents with  . Back Pain    (Consider location/radiation/quality/duration/timing/severity/associated sxs/prior treatment) HPI  Past Medical History  Diagnosis Date  . Fibromyalgia   . Depression   . Hypertension     Past Surgical History  Procedure Date  . Cardiac surgery     MVP 1970    No family history on file.  History  Substance Use Topics  . Smoking status: Current Everyday Smoker  . Smokeless tobacco: Not on file  . Alcohol Use: Not on file    OB History    Grav Para Term Preterm Abortions TAB SAB Ect Mult Living                  Review of Systems  Allergies  Erythromycin; Tramadol hcl; and Naproxen  Home Medications   Current Outpatient Rx  Name Route Sig Dispense Refill  . CEFUROXIME AXETIL 500 MG PO TABS Oral Take 500 mg by mouth 2 (two) times daily.      Marland Kitchen HYDROCHLOROTHIAZIDE 25 MG PO TABS Oral Take 25 mg by mouth daily.     . IBUPROFEN 200 MG PO TABS Oral Take 200 mg by mouth every 6 (six) hours as needed. For pain    . LOPERAMIDE HCL 2 MG PO CAPS Oral Take 2 mg by mouth daily.      Marland Kitchen OVER THE COUNTER MEDICATION Oral Take 10 mLs by mouth every 6 (six) hours as needed. For nausea. Over The Counter Nausea Medication       BP 151/83  Pulse 111  Temp(Src) 98.5 F (36.9 C) (Oral)  Resp 20  SpO2 98%  LMP 12/20/2010  Physical Exam  ED Course  Procedures (including critical care time)  Labs Reviewed  URINALYSIS, ROUTINE W REFLEX MICROSCOPIC - Abnormal; Notable for the following:    Specific Gravity, Urine 1.004 (*)    Hgb urine dipstick TRACE (*)    Leukocytes, UA SMALL (*)    All other components within normal limits  CBC - Abnormal; Notable for the following:    WBC 15.6 (*)    RBC 3.52 (*)    Hemoglobin 10.7 (*)    HCT 31.7 (*)    Platelets 540 (*)    All other components  within normal limits  DIFFERENTIAL - Abnormal; Notable for the following:    Neutrophils Relative 80 (*)    Neutro Abs 12.5 (*)    Monocytes Absolute 1.1 (*)    All other components within normal limits  URINE MICROSCOPIC-ADD ON - Abnormal; Notable for the following:    Squamous Epithelial / LPF FEW (*)    All other components within normal limits  COMPREHENSIVE METABOLIC PANEL  URINE CULTURE   No results found.   No diagnosis found.    MDM  I saw and evaluated the patient, reviewed the resident's note and I agree with the findings and plan.         Delphin Funes A. Patrica Duel, MD 07/26/11 7846

## 2011-07-27 ENCOUNTER — Encounter (HOSPITAL_COMMUNITY): Payer: Self-pay | Admitting: Internal Medicine

## 2011-07-27 DIAGNOSIS — R109 Unspecified abdominal pain: Secondary | ICD-10-CM | POA: Diagnosis present

## 2011-07-27 DIAGNOSIS — D72829 Elevated white blood cell count, unspecified: Secondary | ICD-10-CM | POA: Diagnosis present

## 2011-07-27 DIAGNOSIS — N12 Tubulo-interstitial nephritis, not specified as acute or chronic: Secondary | ICD-10-CM | POA: Diagnosis present

## 2011-07-27 LAB — COMPREHENSIVE METABOLIC PANEL
Alkaline Phosphatase: 54 U/L (ref 39–117)
BUN: 6 mg/dL (ref 6–23)
Calcium: 9.1 mg/dL (ref 8.4–10.5)
GFR calc Af Amer: 89 mL/min — ABNORMAL LOW (ref >90)
Glucose, Bld: 90 mg/dL (ref 70–99)
Potassium: 3.6 mEq/L (ref 3.5–5.1)
Total Protein: 6.9 g/dL (ref 6.0–8.3)

## 2011-07-27 LAB — URINE CULTURE

## 2011-07-27 LAB — PREGNANCY, URINE: Preg Test, Ur: NEGATIVE

## 2011-07-27 LAB — CBC
Hemoglobin: 10.3 g/dL — ABNORMAL LOW (ref 12.0–15.0)
MCH: 29.6 pg (ref 26.0–34.0)
MCHC: 32.7 g/dL (ref 30.0–36.0)
MCV: 90.5 fL (ref 78.0–100.0)
Platelets: 518 10*3/uL — ABNORMAL HIGH (ref 150–400)
RBC: 3.48 MIL/uL — ABNORMAL LOW (ref 3.87–5.11)
WBC: 12.9 10*3/uL — ABNORMAL HIGH (ref 4.0–10.5)

## 2011-07-27 MED ORDER — HYDROMORPHONE HCL PF 1 MG/ML IJ SOLN
1.0000 mg | INTRAMUSCULAR | Status: DC | PRN
  Administered 2011-07-27 – 2011-07-29 (×11): 1 mg via INTRAVENOUS
  Filled 2011-07-27 (×12): qty 1

## 2011-07-27 MED ORDER — ONDANSETRON HCL 4 MG/2ML IJ SOLN
4.0000 mg | Freq: Four times a day (QID) | INTRAMUSCULAR | Status: DC | PRN
  Administered 2011-07-28 – 2011-07-30 (×3): 4 mg via INTRAVENOUS
  Filled 2011-07-27 (×3): qty 2

## 2011-07-27 MED ORDER — SODIUM CHLORIDE 0.9 % IV SOLN
Freq: Once | INTRAVENOUS | Status: AC
  Administered 2011-07-27: via INTRAVENOUS

## 2011-07-27 MED ORDER — ONDANSETRON HCL 4 MG PO TABS: 4.0000 mg | ORAL_TABLET | Freq: Four times a day (QID) | ORAL | Status: DC | PRN

## 2011-07-27 MED ORDER — ACETAMINOPHEN 325 MG PO TABS
650.0000 mg | ORAL_TABLET | Freq: Four times a day (QID) | ORAL | Status: DC | PRN
  Administered 2011-07-27 – 2011-07-28 (×2): 650 mg via ORAL
  Filled 2011-07-27 (×2): qty 2

## 2011-07-27 MED ORDER — SODIUM CHLORIDE 0.45 % IV SOLN
INTRAVENOUS | Status: DC
  Administered 2011-07-27 – 2011-07-30 (×8): via INTRAVENOUS

## 2011-07-27 MED ORDER — PIPERACILLIN-TAZOBACTAM 3.375 G IVPB
3.3750 g | Freq: Three times a day (TID) | INTRAVENOUS | Status: DC
  Administered 2011-07-27 – 2011-07-30 (×10): 3.375 g via INTRAVENOUS
  Filled 2011-07-27 (×12): qty 50

## 2011-07-27 MED ORDER — ACETAMINOPHEN 650 MG RE SUPP: 650.0000 mg | Freq: Four times a day (QID) | RECTAL | Status: DC | PRN

## 2011-07-27 NOTE — Progress Notes (Signed)
Subjective: Patient feeling minimally better. Mildly decreased and abdominal pain. Mildly nauseated. No appetite. No shortness of breath or chest pain. Some improvement in her back pain.  Objective: Weight change:   Intake/Output Summary (Last 24 hours) at 07/27/11 1440 Last data filed at 07/27/11 0600  Gross per 24 hour  Intake    300 ml  Output      0 ml  Net    300 ml   BP 125/66  Pulse 70  Temp(Src) 98.7 F (37.1 C) (Oral)  Resp 20  Ht 5\' 4"  (1.626 m)  Wt 90.946 kg (200 lb 8 oz)  BMI 34.42 kg/m2  SpO2 98%  LMP 12/20/2010 General appearance: alert, cooperative, appears stated age, fatigued, mild distress and mildly obese Lungs: clear to auscultation bilaterally Heart: regular rate and rhythm, S1, S2 normal, no murmur, click, rub or gallop Abdomen: Soft, minimal generalized tenderness, hypoactive bowel sounds, obese Extremities: SCDs  Lab Results: Basic Metabolic Panel:  Basename 07/27/11 0650 07/26/11 2222  NA 143 147*  K 3.6 4.3  CL 106 109  CO2 24 25  GLUCOSE 90 123*  BUN 6 7  CREATININE 0.88 0.86  CALCIUM 9.1 9.3  MG -- --  PHOS -- --   Liver Function Tests:  [facility] 07/27/11 0650 07/26/11 2222  AST 11 11  ALT 15 17  ALKPHOS 54 61  BILITOT 0.2* 0.2*  PROT 6.9 6.8  ALBUMIN 2.6* 2.8*    No results found for this basename: AMMONIA:2 in the last 72 hours CBC:  Basename 07/27/11 0650 07/26/11 2222  WBC 12.9* 15.6*  NEUTROABS -- 12.5*  HGB 10.3* 10.7*  HCT 31.5* 31.7*  MCV 90.5 90.1  PLT 518* 540*    Medications: Scheduled Meds:   . sodium chloride   Intravenous Once  . cefTRIAXone  1 g Intramuscular Once  . lidocaine      .  morphine injection  2 mg Intravenous Once  . piperacillin-tazobactam (ZOSYN)  IV  3.375 g Intravenous Q8H   Continuous Infusions:   . sodium chloride 125 mL/hr at 07/27/11 1209   PRN Meds:.acetaminophen, acetaminophen, HYDROmorphone, ondansetron (ZOFRAN) IV, ondansetron  Assessment/Plan: Patient Active Hospital  Problem List: Pyelonephritis (07/27/2011)   Assessment: Most likely recurrent, given leukocytosis and similar symptoms. If by tomorrow white count is not improved or abdominal pain is worse, we'll check CT abdomen and pelvis with contrast. Continue antibiotics   HYPERTENSION (04/19/2009)  stable  MITRAL VALVE PROLAPSE, HX OF (02/18/2008)  stable  Abdominal pain (07/27/2011)  improving. See above.  Leukocytosis (07/27/2011)  see above. Improving. Continue IV antibiotics. Sources believed to be urine. Follow urine cultures.   LOS: 1 day   Stacy [name] K 07/27/2011, 2:40 PM

## 2011-07-27 NOTE — ED Notes (Signed)
Attempted to call report. States will call back.  

## 2011-07-27 NOTE — H&P (Addendum)
Stacy [name] is an 50 y.o. female.   Chief Complaint: Abdominal pain HPI: 51 year old female who was just recently discharged on NOV 2nd after being treated for pyelonephritis presents with worsening of pain. The pain is in the upper back flanks and epigastric area. She vomited once but had no diarrhea. During last adnmission patient had CT Abdomen and pelvis which showed features of bilateral pyelonephritis. Patient states her abdominal pain had improved by the time she got dischrged last time but got worse by the next day.Denies any fever or chills.  Past Medical History  Diagnosis Date  . Fibromyalgia   . Depression   . Hypertension     Past Surgical History  Procedure Date  . Cardiac surgery     MVP 1970    History reviewed. No pertinent family history. Social History:  reports that she has been smoking.  She does not have any smokeless tobacco history on file. Her alcohol and drug histories not on file.  Allergies:  Allergies  Allergen Reactions  . Erythromycin Nausea And Vomiting  . Tramadol Hcl Other (See Comments)    Headaches  . Naproxen Palpitations    Medications Prior to Admission  Medication Dose Route Frequency Provider Last Rate Last Dose  . 0.9 %  sodium chloride infusion   Intravenous Once Daleen Bo 100 mL/hr at 07/27/11 0015    . cefTRIAXone (ROCEPHIN) injection 1 g  1 g Intramuscular Once Daleen Bo   1 g at 07/26/11 2330  . lidocaine (XYLOCAINE) 1 % injection           . morphine 2 MG/ML injection 2 mg  2 mg Intravenous Once Daleen Bo   2 mg at 07/26/11 2245   Medications Prior to Admission  Medication Sig Dispense Refill  . hydrochlorothiazide (HYDRODIURIL) 25 MG tablet Take 25 mg by mouth daily.       Marland Kitchen ibuprofen (ADVIL,MOTRIN) 200 MG tablet Take 200 mg by mouth every 6 (six) hours as needed. For pain      . loperamide (IMODIUM) 2 MG capsule Take 2 mg by mouth daily.        Marland Kitchen OVER THE COUNTER MEDICATION Take 10 mLs by mouth every 6 (six)  hours as needed. For nausea. Over The Counter Nausea Medication         Results for orders placed during the hospital encounter of 07/26/11 (from the past 48 hour(s))  URINALYSIS, ROUTINE W REFLEX MICROSCOPIC     Status: Abnormal   Collection Time   07/26/11 10:06 PM      Component Value Range Comment   Color, Urine YELLOW  YELLOW     Appearance CLEAR  CLEAR     Specific Gravity, Urine 1.004 (*) 1.005 - 1.030     pH 6.0  5.0 - 8.0     Glucose, UA NEGATIVE  NEGATIVE (mg/dL)    Hgb urine dipstick TRACE (*) NEGATIVE     Bilirubin Urine NEGATIVE  NEGATIVE     Ketones, ur NEGATIVE  NEGATIVE (mg/dL)    Protein, ur NEGATIVE  NEGATIVE (mg/dL)    Urobilinogen, UA 0.2  0.0 - 1.0 (mg/dL)    Nitrite NEGATIVE  NEGATIVE     Leukocytes, UA SMALL (*) NEGATIVE    URINE MICROSCOPIC-ADD ON     Status: Abnormal   Collection Time   07/26/11 10:06 PM      Component Value Range Comment   Squamous Epithelial / LPF FEW (*) RARE  WBC, UA 3-6  <3 (WBC/hpf)    RBC / HPF 3-6  <3 (RBC/hpf)    Bacteria, UA RARE  RARE    COMPREHENSIVE METABOLIC PANEL     Status: Abnormal   Collection Time   07/26/11 10:22 PM      Component Value Range Comment   Sodium 147 (*) 135 - 145 (mEq/L)    Potassium 4.3  3.5 - 5.1 (mEq/L)    Chloride 109  96 - 112 (mEq/L)    CO2 25  19 - 32 (mEq/L)    Glucose, Bld 123 (*) 70 - 99 (mg/dL)    BUN 7  6 - 23 (mg/dL)    Creatinine, Ser 5.78  0.50 - 1.10 (mg/dL)    Calcium 9.3  8.4 - 10.5 (mg/dL)    Total Protein 6.8  6.0 - 8.3 (g/dL)    Albumin 2.8 (*) 3.5 - 5.2 (g/dL)    AST 11  0 - 37 (U/L)    ALT 17  0 - 35 (U/L)    Alkaline Phosphatase 61  39 - 117 (U/L)    Total Bilirubin 0.2 (*) 0.3 - 1.2 (mg/dL)    GFR calc non Af Amer 79 (*) >90 (mL/min)    GFR calc Af Amer >90  >90 (mL/min)   CBC     Status: Abnormal   Collection Time   07/26/11 10:22 PM      Component Value Range Comment   WBC 15.6 (*) 4.0 - 10.5 (K/uL)    RBC 3.52 (*) 3.87 - 5.11 (MIL/uL)    Hemoglobin 10.7 (*) 12.0  - 15.0 (g/dL)    HCT 46.9 (*) 62.9 - 46.0 (%)    MCV 90.1  78.0 - 100.0 (fL)    MCH 30.4  26.0 - 34.0 (pg)    MCHC 33.8  30.0 - 36.0 (g/dL)    RDW 52.8  41.3 - 24.4 (%)    Platelets 540 (*) 150 - 400 (K/uL)   DIFFERENTIAL     Status: Abnormal   Collection Time   07/26/11 10:22 PM      Component Value Range Comment   Neutrophils Relative 80 (*) 43 - 77 (%)    Neutro Abs 12.5 (*) 1.7 - 7.7 (K/uL)    Lymphocytes Relative 12  12 - 46 (%)    Lymphs Abs 1.8  0.7 - 4.0 (K/uL)    Monocytes Relative 7  3 - 12 (%)    Monocytes Absolute 1.1 (*) 0.1 - 1.0 (K/uL)    Eosinophils Relative 1  0 - 5 (%)    Eosinophils Absolute 0.1  0.0 - 0.7 (K/uL)    Basophils Relative 0  0 - 1 (%)    Basophils Absolute 0.1  0.0 - 0.1 (K/uL)    No results found.  Review of Systems  Constitutional: Negative.   HENT: Negative.   Eyes: Negative.   Respiratory: Negative.   Cardiovascular: Negative.   Gastrointestinal: Positive for abdominal pain.  Genitourinary: Negative.   Musculoskeletal: Negative.   Skin: Negative.   Neurological: Negative.   Endo/Heme/Allergies: Negative.   Psychiatric/Behavioral: Negative.     Blood pressure 151/83, pulse 111, temperature 98.5 F (36.9 C), temperature source Oral, resp. rate 20, last menstrual period 12/20/2010, SpO2 98.00%. Physical Exam  Constitutional: She is oriented to person, place, and time. She appears well-developed and well-nourished.  HENT:  Head: Normocephalic and atraumatic.  Eyes: Conjunctivae and EOM are normal. Pupils are equal, round, and reactive to light.  Neck:  Normal range of motion. Neck supple.  Cardiovascular: Normal rate and regular rhythm.   Respiratory: Effort normal and breath sounds normal.  GI: Soft. There is tenderness.  Musculoskeletal: Normal range of motion.  Neurological: She is alert and oriented to person, place, and time. She has normal reflexes.  Skin: Skin is warm and dry.  Psychiatric: Her behavior is normal.      Assessment/Plan 1)Abdominal pain most likely from pyelonephritis. 2)H/O Hypertension 3)H/Omitral valve repair. 4)Hypernatremia.   Plan:  Admit to medical floor. At this I feel her abdominal pain may still be from pyelonephritis. Will keep patient on broad spectrum antibiotics. Will get urine cultures. I am also going to check lipase. I f abdominal pain does not improve then may consider repeating CT abdomen and pelvis. Parminder Cupples N. 07/27/2011, 12:56 AM

## 2011-07-27 NOTE — Progress Notes (Deleted)
Utilization Review Completed.  Miliyah Luper T  07/27/2011 

## 2011-07-27 NOTE — Progress Notes (Signed)
Utilization Review Completed.  Shylin Keizer T  07/27/2011 

## 2011-07-28 NOTE — Progress Notes (Signed)
Subjective: Feel a little better when compared to yesterday. Sill having bilateral flank pain with some pain radiating into right groin. Still having chills and night sweats. Is eating more compared to yesterday.  Objective: Vital signs in last 24 hours: Temp:  [98.4 F (36.9 C)-98.6 F (37 C)] 98.6 F (37 C) (11/10 0543) Pulse Rate:  [62] 62  (11/10 0543) Resp:  [18] 18  (11/10 0543) BP: (118)/(71-77) 118/77 mmHg (11/10 0543) SpO2:  [96 %-98 %] 96 % (11/10 0543) Weight change:  Last BM Date: 07/26/11  Intake/Output from previous day: 11/09 0701 - 11/10 0700 In: 3050 [I.V.:3000; IV Piggyback:50] Out: 700 [Urine:700] Intake/Output this shift: Total I/O In: 720 [P.O.:720] Out: 450 [Urine:450]  General appearance: alert, cooperative and no distress Resp: clear to auscultation bilaterally Cardio: regular rate and rhythm GI: Bilateral flank tenderness +2 . Also RLQ abdominal tenderness  Lab Results:  [facility] 07/27/11 0650 07/26/11 2222  WBC 12.9* 15.6*  HGB 10.3* 10.7*  HCT 31.5* 31.7*  PLT 518* 540*   BMET  Basename 07/27/11 0650 07/26/11 2222  NA 143 147*  K 3.6 4.3  CL 106 109  CO2 24 25  GLUCOSE 90 123*  BUN 6 7  CREATININE 0.88 0.86  CALCIUM 9.1 9.3    Studies/Results: No results found.  Medications: I have reviewed the patient's current medications.  Assessment/Plan: Pyelonephritis Leukocytosis  Abdominal Pain  Plan::  Continue Zosyn AM labs  Continue fluids AM labs  LOS: 2 days   Stacy [name] 07/28/2011, 2:35 PM

## 2011-07-28 NOTE — Progress Notes (Signed)
ANTIBIOTIC CONSULT NOTE - INITIAL  Pharmacy Consult for Zosyn Indication: pyelonephritis  Allergies  Allergen Reactions  . Erythromycin Nausea And Vomiting  . Tramadol Hcl Other (See Comments)    Headaches  . Naproxen Palpitations    Patient Measurements: Height: 5\' 4"  (162.6 cm) Weight: 200 lb 8 oz (90.946 kg) IBW/kg (Calculated) : 54.7   Vital Signs: Temp: 98.6 F (37 C) (11/10 0543) Temp src: Oral (11/10 0543) BP: 118/77 mmHg (11/10 0543) Pulse Rate: 62  (11/10 0543) Intake/Output from previous day: 11/09 0701 - 11/10 0700 In: 3050 [I.V.:3000; IV Piggyback:50] Out: 700 [Urine:700] Intake/Output from this shift: Total I/O In: 720 [P.O.:720] Out: 450 [Urine:450]  Labs:  [facility] 07/27/11 0650 07/26/11 2222  WBC 12.9* 15.6*  HGB 10.3* 10.7*  PLT 518* 540*  LABCREA -- --  CREATININE 0.88 0.86   Estimated Creatinine Clearance: 86.3 ml/min (by C-G formula based on Cr of 0.88). No results found for this basename: VANCOTROUGH:2,VANCOPEAK:2,VANCORANDOM:2,GENTTROUGH:2,GENTPEAK:2,GENTRANDOM:2,TOBRATROUGH:2,TOBRAPEAK:2,TOBRARND:2,AMIKACINPEAK:2,AMIKACINTROU:2,AMIKACIN:2, in the last 72 hours   Microbiology: Recent Results (from the past 720 hour(s))  URINE CULTURE     Status: Normal   Collection Time   07/18/11  3:00 AM      Component Value Range Status Comment   Specimen Description URINE, RANDOM   Final    Special Requests NONE   Final    Setup Time [id number]   Final    Colony Count >=100,000 COLONIES/ML   Final    Culture     Final    Value: Multiple bacterial morphotypes present, none predominant. Suggest appropriate recollection if clinically indicated.   Report Status 07/19/2011 FINAL   Final   CULTURE, BLOOD (ROUTINE X 2)     Status: Normal   Collection Time   07/18/11  1:00 PM      Component Value Range Status Comment   Specimen Description BLOOD RIGHT ARM   Final    Special Requests BOTTLES DRAWN AEROBIC AND ANAEROBIC 10CC   Final    Setup Time  [id number]   Final    Culture NO GROWTH 5 DAYS   Final    Report Status 07/24/2011 FINAL   Final   CULTURE, BLOOD (ROUTINE X 2)     Status: Normal   Collection Time   07/18/11  1:12 PM      Component Value Range Status Comment   Specimen Description BLOOD RIGHT HAND   Final    Special Requests BOTTLES DRAWN AEROBIC ONLY 10CC   Final    Setup Time [id number]   Final    Culture NO GROWTH 5 DAYS   Final    Report Status 07/24/2011 FINAL   Final   URINE CULTURE     Status: Normal   Collection Time   07/26/11 10:06 PM      Component Value Range Status Comment   Specimen Description URINE, RANDOM   Final    Special Requests NONE   Final    Setup Time [id number]   Final    Colony Count 50,000 COLONIES/ML   Final    Culture     Final    Value: Multiple bacterial morphotypes present, none predominant. Suggest appropriate recollection if clinically indicated.   Report Status 07/27/2011 FINAL   Final     Medical History: Past Medical History  Diagnosis Date  . Fibromyalgia   . Depression   . Hypertension     Medications:  Scheduled:    . piperacillin-tazobactam (ZOSYN)  IV  3.375 g Intravenous Q8H   Infusions:    .  sodium chloride 125 mL/hr at 07/28/11 0604   Assessment: Ms. Lukasiewicz is a 50 year old female who was recently discharged from the hospital 11/2 with pyelonephritis. Since she is at risk for multiple organisms, zosyn on board for broad spectrum coverage.  Goal of Therapy:  Cure of refractory pyelonephritis  Plan:  Zosyn 3.375 grams q8h over 4 hour infusion  Katrinka Blazing, [place] R 07/28/2011,11:11 AM

## 2011-07-29 ENCOUNTER — Inpatient Hospital Stay (HOSPITAL_COMMUNITY): Payer: Self-pay

## 2011-07-29 LAB — BASIC METABOLIC PANEL
BUN: 4 mg/dL — ABNORMAL LOW (ref 6–23)
Calcium: 9.2 mg/dL (ref 8.4–10.5)
Creatinine, Ser: 0.93 mg/dL (ref 0.50–1.10)
GFR calc Af Amer: 84 mL/min — ABNORMAL LOW (ref >90)
GFR calc non Af Amer: 72 mL/min — ABNORMAL LOW (ref >90)
Glucose, Bld: 97 mg/dL (ref 70–99)

## 2011-07-29 LAB — CBC
HCT: 29.6 % — ABNORMAL LOW (ref 36.0–46.0)
Hemoglobin: 9.7 g/dL — ABNORMAL LOW (ref 12.0–15.0)
MCH: 29.8 pg (ref 26.0–34.0)
MCHC: 32.8 g/dL (ref 30.0–36.0)
RDW: 12.9 % (ref 11.5–15.5)

## 2011-07-29 MED ORDER — POTASSIUM CHLORIDE CRYS ER 20 MEQ PO TBCR
20.0000 meq | EXTENDED_RELEASE_TABLET | Freq: Every day | ORAL | Status: DC
  Administered 2011-07-30: 20 meq via ORAL
  Filled 2011-07-29: qty 1

## 2011-07-29 MED ORDER — POTASSIUM CHLORIDE CRYS ER 20 MEQ PO TBCR
40.0000 meq | EXTENDED_RELEASE_TABLET | Freq: Once | ORAL | Status: AC
  Administered 2011-07-29: 40 meq via ORAL
  Filled 2011-07-29: qty 2

## 2011-07-29 MED ORDER — IOHEXOL 300 MG/ML  SOLN
100.0000 mL | Freq: Once | INTRAMUSCULAR | Status: AC | PRN
  Administered 2011-07-29: 100 mL via INTRAVENOUS

## 2011-07-29 NOTE — Progress Notes (Signed)
Subjective: Patient c/o bilateral flank pain with radiation to the groin area. She states she feels worse today. Feels like she not getting better.  Objective: Vital signs in last 24 hours: Temp:  [97.9 F (36.6 C)-98.3 F (36.8 C)] 98.3 F (36.8 C) (11/11 0600) Pulse Rate:  [64-74] 74  (11/11 0600) Resp:  [18-19] 19  (11/11 0600) BP: (115-128)/(59-83) 115/59 mmHg (11/11 0600) SpO2:  [94 %-97 %] 94 % (11/11 0600) Weight change:  Last BM Date: 07/28/11  Intake/Output from previous day: 11/10 0701 - 11/11 0700 In: 2080 [P.O.:1080; IV Piggyback:1000] Out: 1900 [Urine:1900] Intake/Output this shift: Total I/O In: 360 [P.O.:360] Out: 500 [Urine:500]  General appearance: alert, cooperative and mild distress Resp: clear to auscultation bilaterally Cardio: regular rate and rhythm, S1, S2 normal, no murmur, click, rub or gallop GI: soft, non-tender; bowel sounds normal; no masses,  no organomegaly  Lab Results:  Basename 07/29/11 0600 07/27/11 0650  WBC 10.8* 12.9*  HGB 9.7* 10.3*  HCT 29.6* 31.5*  PLT 475* 518*   BMET  Basename 07/29/11 0600 07/27/11 0650  NA 141 143  K 3.4* 3.6  CL 106 106  CO2 26 24  GLUCOSE 97 90  BUN 4* 6  CREATININE 0.93 0.88  CALCIUM 9.2 9.1    Studies/Results: No results found.  Medications: I have reviewed the patient's current medications.  Assessment/Plan:   Bilateral flank pain   Lower Quadrant abdominal pain   Leukocytosis improving   Hypokalemia   Pyelonephritis    Anemia  Plan: 1. Continue IV antibiotics           2. CT of ABD and Pelvis           3.KCL replacement           $. AM labs     LOS: 3 days   Stacy [name] 07/29/2011, 11:31 AM

## 2011-07-29 NOTE — ED Provider Notes (Signed)
I saw and evaluated the patient, reviewed the resident's note and I agree with the findings and plan.  Alese Furniss A. Patrica Duel, MD 07/29/11 [phone]

## 2011-07-30 MED ORDER — ACETAMINOPHEN 325 MG PO TABS: 650.0000 mg | ORAL_TABLET | Freq: Four times a day (QID) | ORAL | Status: AC | PRN

## 2011-07-30 MED ORDER — CIPROFLOXACIN HCL 500 MG PO TABS: 500.0000 mg | ORAL_TABLET | Freq: Two times a day (BID) | ORAL | Status: AC

## 2011-07-30 NOTE — Progress Notes (Signed)
DC inst. Given to patient and spouse. DC'd by wheelchair.Malen Gauze Luter11/08/2011

## 2011-07-30 NOTE — Discharge Summary (Signed)
Physician Discharge Summary  Patient ID: Stacy [name] MRN: [id number] DOB/AGE: 1960-12-23 50 y.o.  Admit date: 07/26/2011 Discharge date: 07/30/2011  Primary Care Physician:  Ruthe Mannan, MD, MD   Discharge Diagnoses:   1.Pyelonephritis  2. Nausea and vomiting resolved  3.  HYPERTENSION  4. MITRAL VALVE PROLAPSE, HX OF  5. Abdominal pain  6.Leukocytosis: Resolved  7. History of anxiety disorder  8. History of fibromyalgia    Current Discharge Medication List    START taking these medications   Details  acetaminophen (TYLENOL) 325 MG tablet Take 2 tablets (650 mg total) by mouth every 6 (six) hours as needed for pain (or Fever >/= 101). Qty: 1 tablet, Refills: 0    ciprofloxacin (CIPRO) 500 MG tablet Take 1 tablet (500 mg total) by mouth 2 (two) times daily. Qty: 8 tablet, Refills: 0      STOP taking these medications     cefUROXime (CEFTIN) 500 MG tablet      hydrochlorothiazide (HYDRODIURIL) 25 MG tablet      ibuprofen (ADVIL,MOTRIN) 200 MG tablet      loperamide (IMODIUM) 2 MG capsule      OVER THE COUNTER MEDICATION          Disposition and Follow-up:  PCP Dr. Dayton Martes in one week  Consults: None   Significant Diagnostic Studies:  Ct Abdomen Pelvis W Wo Contrast  07/29/2011  *RADIOLOGY REPORT*  Clinical Data: Bilateral flank pain, lower quadrant abdominal pain  CT ABDOMEN AND PELVIS WITHOUT AND WITH CONTRAST  Technique:  Multidetector CT imaging of the abdomen and pelvis was performed without contrast material in one or both body regions, followed by contrast material(s) and further sections in one or both body regions.  Contrast: OMNIPAQUE IOHEXOL 300 MG/ML IV SOLN  Comparison: 07/18/2011  Findings: Lung bases are clear.  No focal hepatic lesion. Gallbladder, pancreas, spleen, adrenal glands are normal.  There is improved perfusion to the right kidney compared to prior. There is decreased renal edema.  On the delayed pyelogram phase imaging there is a  residual asymmetric perfusion in the mid and lower pole and mild delayed excretion compared to the left kidney. No evidence of obstruction. There is homogeneous perfusion of the right kidney.  The stomach, small bowel, appendix, and colon are normal.  Abdominal aorta normal caliber.  No retroperitoneal lymphadenopathy.  No free fluid the pelvis.  Distal ureteral stones or bladder stones.  The uterus and ovaries are normal.  No pelvic lymphadenopathy. Review of  bone windows demonstrates no aggressive osseous lesions.  IMPRESSION:  Improved perfusion to the  left and right kidney consistent with improved pyelonephritis.  Original Report Authenticated By: Genevive Bi, M.D.    Brief H and P: [name] is a 50 year old white female with recently been discharged in the hospital and an antibiotic course for pyelonephritis went home and subsequently presented back to the hospital in a few days with persistent bilateral flank pain, nausea and vomiting.  Hospital Course:  Principal Problem:  *Pyelonephritis: She had been discharged home with a course of cefuroxime from her last hospitalization, clinically she was felt to have partially treated pyelonephritis, repeat urine cultures were done this hospitalization and were negative. 2 days ago she also had a CT of the abdomen and pelvis with contrast which showed improving pyelonephritis, she was treated with IV Zosyn and subsequently transitioned to ciprofloxacin is being discharged home on 4 more days of Cipro to complete a ten-day course of antibiotics this time. Clinically  she has improved in terms of her flank pain nausea and vomiting. Leukocytosis and fevers have resolved.   Time spent on Discharge:  Signed: Jaina Morin 07/30/2011, 11:33 AM

## 2011-08-16 ENCOUNTER — Encounter: Payer: Self-pay | Admitting: Family Medicine

## 2011-08-16 ENCOUNTER — Ambulatory Visit (INDEPENDENT_AMBULATORY_CARE_PROVIDER_SITE_OTHER): Payer: Self-pay | Admitting: Family Medicine

## 2011-08-16 VITALS — BP 130/102 | HR 95 | Temp 97.6°F | Ht 64.0 in | Wt 208.2 lb

## 2011-08-16 DIAGNOSIS — R300 Dysuria: Secondary | ICD-10-CM

## 2011-08-16 DIAGNOSIS — N12 Tubulo-interstitial nephritis, not specified as acute or chronic: Secondary | ICD-10-CM

## 2011-08-16 DIAGNOSIS — R1013 Epigastric pain: Secondary | ICD-10-CM

## 2011-08-16 LAB — CBC WITH DIFFERENTIAL/PLATELET
Basophils Relative: 0.4 % (ref 0.0–3.0)
Eosinophils Absolute: 0.2 10*3/uL (ref 0.0–0.7)
Lymphocytes Relative: 15.5 % (ref 12.0–46.0)
MCHC: 34.2 g/dL (ref 30.0–36.0)
MCV: 89.1 fl (ref 78.0–100.0)
Monocytes Absolute: 0.6 10*3/uL (ref 0.1–1.0)
Neutrophils Relative %: 76 % (ref 43.0–77.0)
Platelets: 306 10*3/uL (ref 150.0–400.0)
RBC: 3.38 Mil/uL — ABNORMAL LOW (ref 3.87–5.11)
WBC: 9.4 10*3/uL (ref 4.5–10.5)

## 2011-08-16 LAB — POCT URINALYSIS DIPSTICK
Bilirubin, UA: NEGATIVE
Glucose, UA: NEGATIVE
Ketones, UA: NEGATIVE
Spec Grav, UA: <=1.005

## 2011-08-16 LAB — BASIC METABOLIC PANEL
BUN: 8 mg/dL (ref 6–23)
CO2: 27 mEq/L (ref 19–32)
Calcium: 8.8 mg/dL (ref 8.4–10.5)
Creatinine, Ser: 1.1 mg/dL (ref 0.4–1.2)

## 2011-08-16 LAB — LIPASE: Lipase: 14 U/L (ref 11.0–59.0)

## 2011-08-16 MED ORDER — ESOMEPRAZOLE MAGNESIUM 40 MG PO CPDR: 40.0000 mg | DELAYED_RELEASE_CAPSULE | Freq: Every day | ORAL | Status: DC

## 2011-08-16 MED ORDER — CIPROFLOXACIN HCL 500 MG PO TABS: 500.0000 mg | ORAL_TABLET | Freq: Two times a day (BID) | ORAL | Status: AC

## 2011-08-16 NOTE — Patient Instructions (Addendum)
Hang in there. Please take Nexium - 1 tablet  Nightly until you run out. I have sent your urine for culture, if your symptoms worsen over the weekend, please go back to the ER. I have also sent in prescription for 3 more days of cipro.

## 2011-08-16 NOTE — Progress Notes (Signed)
  Subjective:    Patient ID: Stacy [name], female    DOB: 03/03/1961, 50 y.o.   MRN: [id number]  HPI  50 yo here for hospital follow up.  Notes reviewed from [facility]- admitted 07/26/2011-07/30/2011 for pyelonephritis.  Was just hospitalized days prior to this admission for pyelo and sent home with po cefuroxime but had to return to hospital with vomiting and bilateral flank pain.  Treated with IV Zosyn and transition to po cipro which she finished last week.  CT of abdomen and pelvis- resolving pyelonephritis. Leukocytosis and fevers had resolved at time of discharge. Repeat Urine Cx neg.   Lab Results  Component Value Date   WBC 10.8* 07/29/2011   HGB 9.7* 07/29/2011   HCT 29.6* 07/29/2011   MCV 91.1 07/29/2011   PLT 475* 07/29/2011   Had some hypokalemia as well that resolved at time of discharge also.  Lab Results  Component Value Date   NA 141 07/29/2011   K 3.4* 07/29/2011   CL 106 07/29/2011   CO2 26 07/29/2011   She says she still feels bad.  Flank pain has resolved but still nauseated, no vomiting. Having epigastric pain. No dark stools, no changes in bowel habits.  Review of Systems    See HPI Objective:   Physical Exam  BP 130/102  Pulse 95  Temp(Src) 97.6 F (36.4 C) (Oral)  Ht 5\' 4"  (1.626 m)  Wt 208 lb 4 oz (94.462 kg)  BMI 35.75 kg/m2  LMP 12/20/2010 General appearance: alert, cooperative and very tearful Resp: clear to auscultation bilaterally  Cardio: regular rate and rhythm, S1, S2 normal, no murmur, click, rub or gallop  GI: soft, ttp over epigastric area, no rebound or guarding ; bowel sounds normal; no masses, no organomegaly Psych:  Very tearful and moderately anxious     Assessment & Plan:   1. Pyelonephritis  Resolving but UA still pos for LE and blood. I would like to refer to urology but she has no insurance and would like to await urine cx results first.  Extend course of cipro for 3 days until we have results. Basic Metabolic  Panel (BMET), CBC w/Diff  2. Epigastric pain  New- gastritis vs PUD. Will also check lipase and CBC. Samples of nexium given. Lipase

## 2011-08-18 LAB — URINE CULTURE: Colony Count: >=100000

## 2013-08-21 ENCOUNTER — Other Ambulatory Visit: Payer: Self-pay | Admitting: Family Medicine

## 2013-08-21 DIAGNOSIS — Z1231 Encounter for screening mammogram for malignant neoplasm of breast: Secondary | ICD-10-CM

## 2013-09-03 ENCOUNTER — Ambulatory Visit (HOSPITAL_COMMUNITY)
Admission: RE | Admit: 2013-09-03 | Discharge: 2013-09-03 | Disposition: A | Discharge status: Home / Self Care | Payer: Self-pay | Source: Ambulatory Visit | Attending: Family Medicine | Admitting: Family Medicine

## 2013-09-03 DIAGNOSIS — Z1231 Encounter for screening mammogram for malignant neoplasm of breast: Secondary | ICD-10-CM

## 2013-12-19 ENCOUNTER — Emergency Department (HOSPITAL_COMMUNITY)
Admission: EM | Admit: 2013-12-19 | Discharge: 2013-12-19 | Disposition: A | Discharge status: Home / Self Care | Payer: Self-pay | Source: Home / Self Care | Attending: Family Medicine | Admitting: Family Medicine

## 2013-12-19 DIAGNOSIS — M461 Sacroiliitis, not elsewhere classified: Secondary | ICD-10-CM

## 2013-12-19 DIAGNOSIS — M5430 Sciatica, unspecified side: Secondary | ICD-10-CM

## 2013-12-19 DIAGNOSIS — M5431 Sciatica, right side: Secondary | ICD-10-CM

## 2013-12-19 MED ORDER — CYCLOBENZAPRINE HCL 10 MG PO TABS: 5.0000 mg | ORAL_TABLET | Freq: Three times a day (TID) | ORAL | Status: DC | PRN

## 2013-12-19 MED ORDER — PREDNISONE 50 MG PO TABS: ORAL_TABLET | ORAL | Status: DC

## 2013-12-19 MED ORDER — OXYCODONE-ACETAMINOPHEN 5-325 MG PO TABS: 1.0000 | ORAL_TABLET | Freq: Four times a day (QID) | ORAL | Status: DC | PRN

## 2013-12-19 NOTE — ED Provider Notes (Signed)
CSN: [id number]     Arrival date & time 12/19/13  1846 History   First MD Initiated Contact with Patient 12/19/13 1918     Chief Complaint  Patient presents with  . Back Pain   (Consider location/radiation/quality/duration/timing/severity/associated sxs/prior Treatment) HPI  She is here today for low back pain that radiates to her right leg.  She states symptoms started 3-4 days ago.  Pain is located in her low back and radiates around her hip and down the outside of her right leg to the foot.  Pain is worse with lying with legs straight.  Will also get shooting pains to the right foot with walking.  No bowel or bladder dysfunction.  States the right leg is weak, but thinks it is from the pain.  No numbness or tingling.  She has been taking ibuprofen without relief.  She has never had pain like this before.  No trauma or falls.  Past Medical History  Diagnosis Date  . Fibromyalgia   . Depression   . Hypertension    Past Surgical History  Procedure Laterality Date  . Cardiac surgery      MVP 1970  . Cardiac valve replacement    . Back surgery     No family history on file. History  Substance Use Topics  . Smoking status: Current Every Day Smoker -- 0.25 packs/day    Types: Cigarettes  . Smokeless tobacco: Former Systems developer  . Alcohol Use: No   OB History   Grav Para Term Preterm Abortions TAB SAB Ect Mult Living                 Review of Systems  Constitutional: Negative for fever.  Gastrointestinal: Negative.   Genitourinary: Negative.   Musculoskeletal: Positive for back pain.  Neurological: Positive for weakness. Negative for numbness.    Allergies  Erythromycin; Tramadol hcl; and Naproxen  Home Medications   Current Outpatient Rx  Name  Route  Sig  Dispense  Refill  . ibuprofen (ADVIL,MOTRIN) 400 MG tablet   Oral   Take 400 mg by mouth every 6 (six) hours as needed.         . cyclobenzaprine (FLEXERIL) 10 MG tablet   Oral   Take 0.5-1 tablets (5-10 mg total)  by mouth 3 (three) times daily as needed for muscle spasms.   30 tablet   0   . oxyCODONE-acetaminophen (PERCOCET/ROXICET) 5-325 MG per tablet   Oral   Take 1 tablet by mouth every 6 (six) hours as needed for severe pain.   20 tablet   0   . predniSONE (DELTASONE) 50 MG tablet      Take 1 tablet daily for 5 days.   5 tablet   0    BP 116/84  Pulse 90  Temp(Src) 98 F (36.7 C) (Oral)  Resp 20  SpO2 98% Physical Exam  Constitutional: She is oriented to person, place, and time. She appears well-developed and well-nourished. She appears distressed (moderate).  HENT:  Head: Normocephalic and atraumatic.  Neck: Neck supple.  Musculoskeletal:  Back: no erythema or swelling; no specific vertebral tenderness or step offs; diffuse tender across low back, worse over right SI joint. No piriformis tenderness. No greater trochanter tenderness. Seated SLR negative, unable to perform lying SLR or FABER secondary to pain in the right lower back.  Neurological: She is alert and oriented to person, place, and time. She displays no atrophy. No sensory deficit. She exhibits normal muscle tone.  Reflex Scores:  Patellar reflexes are 2+ on the right side and 2+ on the left side.      Achilles reflexes are 2+ on the right side and 2+ on the left side. Skin: Skin is warm and dry.    ED Course  Procedures (including critical care time) Labs Review Labs Reviewed - No data to display Imaging Review No results found.   MDM   1. Sciatica of right side   2. SI (sacroiliac) joint inflammation    No red flags to indicate cauda equina.  No fever or vertebral tenderness. Pt drove herself so no meds administered here. Prescriptions for prednisone, percocet and flexeril given. Return precautions reviewed. F/u with PCP this week.    Melony Overly, MD 12/19/13 [phone]

## 2013-12-19 NOTE — Discharge Instructions (Signed)
Sciatica °Sciatica is pain, weakness, numbness, or tingling along your sciatic nerve. The nerve starts in the lower back and runs down the back of each leg. Nerve damage or certain conditions pinch or put pressure on the sciatic nerve. This causes the pain, weakness, and other discomforts of sciatica. °HOME CARE  °· Only take medicine as told by your doctor. °· Apply ice to the affected area for 20 minutes. Do this 3 4 times a day for the first 48 72 hours. Then try heat in the same way. °· Exercise, stretch, or do your usual activities if these do not make your pain worse. °· Go to physical therapy as told by your doctor. °· Keep all doctor visits as told. °· Do not wear high heels or shoes that are not supportive. °· Get a firm mattress if your mattress is too soft to lessen pain and discomfort. °GET HELP RIGHT AWAY IF:  °· You cannot control when you poop (bowel movement) or pee (urinate). °· You have more weakness in your lower back, lower belly (pelvis), butt (buttocks), or legs. °· You have redness or puffiness (swelling) of your back. °· You have a burning feeling when you pee. °· You have pain that gets worse when you lie down. °· You have pain that wakes you from your sleep. °· Your pain is worse than past pain. °· Your pain lasts longer than 4 weeks. °· You are suddenly losing weight without reason. °MAKE SURE YOU:  °· Understand these instructions. °· Will watch this condition. °· Will get help right away if you are not doing well or get worse. °Document Released: 06/12/2008 Document Revised: 03/04/2012 Document Reviewed: 01/13/2012 °ExitCare® Patient Information ©2014 [employer]. ° °

## 2013-12-19 NOTE — ED Notes (Signed)
Complains of lower back pain that radiates down right leg, sx started 3-4 days ago

## 2013-12-21 NOTE — ED Provider Notes (Signed)
Medical screening examination/treatment/procedure(s) were performed by resident physician or non-physician practitioner and as supervising physician I was immediately available for consultation/collaboration.   Pauline Good MD.   Billy Fischer, MD 12/21/13 2119

## 2014-10-02 DIAGNOSIS — M1991 Primary osteoarthritis, unspecified site: Secondary | ICD-10-CM

## 2014-10-02 DIAGNOSIS — Z471 Aftercare following joint replacement surgery: Secondary | ICD-10-CM

## 2014-10-02 DIAGNOSIS — F329 Major depressive disorder, single episode, unspecified: Secondary | ICD-10-CM

## 2014-10-02 DIAGNOSIS — M797 Fibromyalgia: Secondary | ICD-10-CM

## 2014-12-02 ENCOUNTER — Emergency Department (HOSPITAL_COMMUNITY)
Admission: EM | Admit: 2014-12-02 | Discharge: 2014-12-02 | Discharge status: Against Medical Advice | Payer: Self-pay | Attending: Emergency Medicine | Admitting: Emergency Medicine

## 2014-12-02 ENCOUNTER — Encounter (HOSPITAL_COMMUNITY): Payer: Self-pay | Admitting: *Deleted

## 2014-12-02 DIAGNOSIS — I10 Essential (primary) hypertension: Secondary | ICD-10-CM | POA: Insufficient documentation

## 2014-12-02 DIAGNOSIS — M545 Low back pain: Secondary | ICD-10-CM | POA: Insufficient documentation

## 2014-12-02 DIAGNOSIS — Z720 Tobacco use: Secondary | ICD-10-CM | POA: Insufficient documentation

## 2014-12-02 NOTE — ED Notes (Signed)
Called patient no answer.

## 2014-12-02 NOTE — ED Notes (Signed)
Pt in c/o lower back pain that radiates down her right leg, pain worse with movement, symptoms have been going on for 8 months and getting progressively worse

## 2014-12-02 NOTE — ED Notes (Signed)
Called patient name x3 and got no response. Grabbed next patient in waiting room

## 2015-03-02 ENCOUNTER — Telehealth: Payer: Self-pay | Admitting: Neurology

## 2015-03-02 NOTE — Telephone Encounter (Signed)
Vicente Males with [employer] is calling and states that the patient does not have [employer].  The patient has been called to update coverage but no response.   Please call. Thanks!

## 2015-03-02 NOTE — Telephone Encounter (Signed)
Pt has never seen Dr. Brett Fairy. Nothing further is needed.

## 2015-03-03 ENCOUNTER — Telehealth: Payer: Self-pay | Admitting: Neurology

## 2015-04-03 ENCOUNTER — Emergency Department (HOSPITAL_COMMUNITY): Payer: Self-pay

## 2015-04-03 ENCOUNTER — Encounter (HOSPITAL_COMMUNITY): Payer: Self-pay

## 2015-04-03 ENCOUNTER — Emergency Department (HOSPITAL_COMMUNITY)
Admission: EM | Admit: 2015-04-03 | Discharge: 2015-04-03 | Disposition: A | Discharge status: Home / Self Care | Payer: Self-pay | Attending: Emergency Medicine | Admitting: Emergency Medicine

## 2015-04-03 DIAGNOSIS — I10 Essential (primary) hypertension: Secondary | ICD-10-CM | POA: Insufficient documentation

## 2015-04-03 DIAGNOSIS — Z8659 Personal history of other mental and behavioral disorders: Secondary | ICD-10-CM | POA: Insufficient documentation

## 2015-04-03 DIAGNOSIS — Z720 Tobacco use: Secondary | ICD-10-CM | POA: Insufficient documentation

## 2015-04-03 DIAGNOSIS — L03011 Cellulitis of right finger: Secondary | ICD-10-CM | POA: Insufficient documentation

## 2015-04-03 DIAGNOSIS — Z7952 Long term (current) use of systemic steroids: Secondary | ICD-10-CM | POA: Insufficient documentation

## 2015-04-03 DIAGNOSIS — R42 Dizziness and giddiness: Secondary | ICD-10-CM | POA: Insufficient documentation

## 2015-04-03 LAB — CBC WITH DIFFERENTIAL/PLATELET
BASOS ABS: 0 10*3/uL (ref 0.0–0.1)
Basophils Relative: 0 % (ref 0–1)
EOS ABS: 0.1 10*3/uL (ref 0.0–0.7)
Eosinophils Relative: 1 % (ref 0–5)
HEMATOCRIT: 39.7 % (ref 36.0–46.0)
HEMOGLOBIN: 13.9 g/dL (ref 12.0–15.0)
LYMPHS PCT: 21 % (ref 12–46)
Lymphs Abs: 2.8 10*3/uL (ref 0.7–4.0)
MCH: 30.3 pg (ref 26.0–34.0)
MCHC: 35 g/dL (ref 30.0–36.0)
MCV: 86.5 fL (ref 78.0–100.0)
MONO ABS: 1.2 10*3/uL — AB (ref 0.1–1.0)
Monocytes Relative: 9 % (ref 3–12)
NEUTROS PCT: 69 % (ref 43–77)
Neutro Abs: 9.4 10*3/uL — ABNORMAL HIGH (ref 1.7–7.7)
PLATELETS: 320 10*3/uL (ref 150–400)
RBC: 4.59 MIL/uL (ref 3.87–5.11)
RDW: 13.1 % (ref 11.5–15.5)
WBC: 13.5 10*3/uL — AB (ref 4.0–10.5)

## 2015-04-03 LAB — COMPREHENSIVE METABOLIC PANEL
ALK PHOS: 62 U/L (ref 38–126)
ALT: 17 U/L (ref 14–54)
AST: 20 U/L (ref 15–41)
Albumin: 3.7 g/dL (ref 3.5–5.0)
Anion gap: 9 (ref 5–15)
BILIRUBIN TOTAL: 0.7 mg/dL (ref 0.3–1.2)
BUN: 13 mg/dL (ref 6–20)
CHLORIDE: 105 mmol/L (ref 101–111)
CO2: 22 mmol/L (ref 22–32)
Calcium: 9.1 mg/dL (ref 8.9–10.3)
Creatinine, Ser: 0.86 mg/dL (ref 0.44–1.00)
GFR calc Af Amer: >60 mL/min (ref >60)
GLUCOSE: 94 mg/dL (ref 65–99)
Potassium: 3.6 mmol/L (ref 3.5–5.1)
SODIUM: 136 mmol/L (ref 135–145)
Total Protein: 6.8 g/dL (ref 6.5–8.1)

## 2015-04-03 LAB — I-STAT TROPONIN, ED: TROPONIN I, POC: 0 ng/mL (ref 0.00–0.08)

## 2015-04-03 MED ORDER — DOXYCYCLINE HYCLATE 100 MG PO CAPS: 100.0000 mg | ORAL_CAPSULE | Freq: Two times a day (BID) | ORAL | Status: DC

## 2015-04-03 MED ORDER — MECLIZINE HCL 12.5 MG PO TABS: 12.5000 mg | ORAL_TABLET | Freq: Three times a day (TID) | ORAL | Status: DC | PRN

## 2015-04-03 MED ORDER — HYDROCHLOROTHIAZIDE 25 MG PO TABS
25.0000 mg | ORAL_TABLET | Freq: Every day | ORAL | Status: DC
  Filled 2015-04-03: qty 1

## 2015-04-03 MED ORDER — LIDOCAINE HCL 2 % IJ SOLN
5.0000 mL | Freq: Once | INTRAMUSCULAR | Status: AC
  Administered 2015-04-03: 100 mg
  Filled 2015-04-03: qty 20

## 2015-04-03 MED ORDER — MORPHINE SULFATE 4 MG/ML IJ SOLN
4.0000 mg | Freq: Once | INTRAMUSCULAR | Status: AC
  Administered 2015-04-03: 4 mg via INTRAVENOUS
  Filled 2015-04-03: qty 1

## 2015-04-03 MED ORDER — MECLIZINE HCL 25 MG PO TABS
25.0000 mg | ORAL_TABLET | Freq: Once | ORAL | Status: AC
  Administered 2015-04-03: 25 mg via ORAL
  Filled 2015-04-03: qty 1

## 2015-04-03 NOTE — ED Notes (Addendum)
Onset 03-28-15 pimple noted on cuticle of right thumb, darkened pus filled area on side of thumb and painful.  No fevers.  Onset yesterday dizziness, pt having dizziness at this time, worse yesterday.

## 2015-04-03 NOTE — ED Notes (Signed)
Patient still at [facility]. Unable to obtain EKG at this time

## 2015-04-03 NOTE — ED Provider Notes (Signed)
CSN: [id number]     Arrival date & time 04/03/15  1800 History   First MD Initiated Contact with Patient 04/03/15 1813     Chief Complaint  Patient presents with  . Dizziness  . Wound Infection     (Consider location/radiation/quality/duration/timing/severity/associated sxs/prior Treatment) The history is provided by the patient.  Stacy [name] is a 54 y.o. female hx of fibromyalgia, HTN here with R thumb pain, dizziness. Patient noticed right thumb swelling since 7/11. She noticed that more swelling today and increased pain. Denies fever or chills. Does feel dizzy since yesterday. States that room was spinning but denies falling.    Past Medical History  Diagnosis Date  . Fibromyalgia   . Depression   . Hypertension    Past Surgical History  Procedure Laterality Date  . Cardiac surgery      MVP 1970  . Cardiac valve replacement    . Back surgery     History reviewed. No pertinent family history. History  Substance Use Topics  . Smoking status: Current Every Day Smoker -- 0.25 packs/day    Types: Cigarettes  . Smokeless tobacco: Former Systems developer  . Alcohol Use: No   OB History    No data available     Review of Systems  Skin: Positive for color change and wound.  Neurological: Positive for dizziness.  All other systems reviewed and are negative.     Allergies  Naproxen; Erythromycin; and Tramadol hcl  Home Medications   Prior to Admission medications   Medication Sig Start Date End Date Taking? Authorizing Provider  ibuprofen (ADVIL,MOTRIN) 200 MG tablet Take 400 mg by mouth 3 (three) times daily as needed for moderate pain.   Yes Historical Provider, MD  cyclobenzaprine (FLEXERIL) 10 MG tablet Take 0.5-1 tablets (5-10 mg total) by mouth 3 (three) times daily as needed for muscle spasms. 12/19/13   Melony Overly, MD  oxyCODONE-acetaminophen (PERCOCET/ROXICET) 5-325 MG per tablet Take 1 tablet by mouth every 6 (six) hours as needed for severe pain. 12/19/13   Melony Overly, MD  predniSONE (DELTASONE) 50 MG tablet Take 1 tablet daily for 5 days. 12/19/13   Melony Overly, MD   BP 186/95 mmHg  Pulse 82  Temp(Src) 97.9 F (36.6 C) (Oral)  Resp 18  Ht 5\' 4"  (1.626 m)  Wt 214 lb 3.2 oz (97.16 kg)  BMI 36.75 kg/m2  SpO2 97%  LMP 03/26/2015 Physical Exam  Constitutional: She is oriented to person, place, and time.  Uncomfortable   HENT:  Head: Normocephalic.  Mouth/Throat: Oropharynx is clear and moist.  Eyes: Conjunctivae are normal. Pupils are equal, round, and reactive to light.  Mild R nystagmus   Neck: Normal range of motion. Neck supple.  Cardiovascular: Normal rate, regular rhythm and normal heart sounds.   Pulmonary/Chest: Effort normal and breath sounds normal. No respiratory distress. She has no wheezes. She has no rales.  Abdominal: Soft. Bowel sounds are normal. She exhibits no distension. There is no tenderness. There is no rebound and no guarding.  Musculoskeletal: Normal range of motion. She exhibits no edema or tenderness.  R thumb with obvious paronychia.   Neurological: She is alert and oriented to person, place, and time. No cranial nerve deficit. Coordination normal.  Nl finger to nose. Nl strength throughout. Nl gait.   Skin: Skin is warm and dry.  Psychiatric: She has a normal mood and affect. Her behavior is normal. Judgment and thought content normal.  Nursing note and  vitals reviewed.   ED Course  Procedures (including critical care time)  INCISION AND DRAINAGE Performed by: Darl Householder, Latora Quarry Consent: Verbal consent obtained. Risks and benefits: risks, benefits and alternatives were discussed Type: abscess  Body area: R thumb  Anesthesia: digital block  Incision was made with a scalpel.  Local anesthetic: lidocaine 2% no epinephrine  Anesthetic total: 5 ml  Complexity: complex Blunt dissection to break up loculations  Drainage: purulent  Drainage amount: mod  Packing material: none  Patient tolerance: Patient  tolerated the procedure well with no immediate complications.     Labs Review Labs Reviewed  CBC WITH DIFFERENTIAL/PLATELET - Abnormal; Notable for the following:    WBC 13.5 (*)    Neutro Abs 9.4 (*)    Monocytes Absolute 1.2 (*)    All other components within normal limits  COMPREHENSIVE METABOLIC PANEL  I-STAT TROPOININ, ED    Imaging Review Ct Head Wo Contrast  04/03/2015   CLINICAL DATA:  Dizziness, elevated BP  EXAM: CT HEAD WITHOUT CONTRAST  TECHNIQUE: Contiguous axial images were obtained from the base of the skull through the vertex without intravenous contrast.  COMPARISON:  06/14/2010  FINDINGS: There is no evidence of mass effect, midline shift or extra-axial fluid collections. There is no evidence of a space-occupying lesion or intracranial hemorrhage. There is no evidence of a cortical-based area of acute infarction.  The ventricles and sulci are appropriate for the patient's age. The basal cisterns are patent.  Visualized portions of the orbits are unremarkable. There is a small mucous retention cyst in the right maxillary sinus.  The calvarium is intact. There are bilateral old nasal bone fractures.  IMPRESSION: No acute intracranial pathology.   Electronically Signed   By: Kathreen Devoid   On: 04/03/2015 19:00     EKG Interpretation   Date/Time:  Sunday April 03 2015 19:00:51 EDT Ventricular Rate:  85 PR Interval:  180 QRS Duration: 90 QT Interval:  353 QTC Calculation: 420 R Axis:   68 Text Interpretation:  Sinus rhythm Consider left atrial enlargement No  significant change since last tracing Confirmed by Nikala Walsworth  MD, Oddie Kuhlmann ([id number])  on 04/03/2015 7:04:47 PM      MDM   Final diagnoses:  None    Stacy [name] is a 54 y.o. female here with dizziness, R paronychia. Dizziness likely from peripheral vertigo vs symptomatic hypertension. No signs of stroke. Will I and D paronychia. Will get labs, CT head.   8:29 PM Labs showed WBC 13, likely from paronychia. CT head  unremarkable. I and D performed. Will dc home with doxy. BP dec to 158/90 from 200/107. I think likely related to pain and infection. Will not start antihypertensives for now. Will have her see PCP for BP check in a week.    Wandra Arthurs, MD 04/03/15 2031

## 2015-04-03 NOTE — Discharge Instructions (Signed)
Take doxycyline as prescribed.   Use warm soaks twice daily.   Change dressing when it gets soaked.   Take meclizine for dizziness.   Follow up with your doctor.   See your doctor in a week to recheck blood pressure.   Return to ER if you have worse swelling and pain, fever, dizziness.

## 2015-04-12 NOTE — Telephone Encounter (Signed)
ERROR

## 2015-06-03 ENCOUNTER — Other Ambulatory Visit: Payer: Self-pay | Admitting: Internal Medicine

## 2015-06-03 DIAGNOSIS — Z1231 Encounter for screening mammogram for malignant neoplasm of breast: Secondary | ICD-10-CM

## 2015-06-14 ENCOUNTER — Ambulatory Visit (HOSPITAL_COMMUNITY)
Admission: RE | Admit: 2015-06-14 | Discharge: 2015-06-14 | Disposition: A | Discharge status: Home / Self Care | Payer: BLUE CROSS/BLUE SHIELD | Source: Ambulatory Visit | Attending: Internal Medicine | Admitting: Internal Medicine

## 2015-06-14 DIAGNOSIS — Z1231 Encounter for screening mammogram for malignant neoplasm of breast: Secondary | ICD-10-CM | POA: Diagnosis not present

## 2015-06-17 ENCOUNTER — Ambulatory Visit: Payer: Self-pay | Admitting: Internal Medicine

## 2015-06-21 ENCOUNTER — Ambulatory Visit: Payer: Self-pay | Admitting: Internal Medicine

## 2015-06-27 ENCOUNTER — Encounter: Payer: Self-pay | Admitting: Internal Medicine

## 2015-06-27 ENCOUNTER — Ambulatory Visit (INDEPENDENT_AMBULATORY_CARE_PROVIDER_SITE_OTHER): Payer: BLUE CROSS/BLUE SHIELD | Admitting: Internal Medicine

## 2015-06-27 ENCOUNTER — Ambulatory Visit (INDEPENDENT_AMBULATORY_CARE_PROVIDER_SITE_OTHER)
Admission: RE | Admit: 2015-06-27 | Discharge: 2015-06-27 | Disposition: A | Discharge status: Home / Self Care | Payer: BLUE CROSS/BLUE SHIELD | Source: Ambulatory Visit | Attending: Internal Medicine | Admitting: Internal Medicine

## 2015-06-27 VITALS — BP 124/90 | HR 89 | Temp 98.0°F | Ht 63.75 in | Wt 216.0 lb

## 2015-06-27 DIAGNOSIS — M546 Pain in thoracic spine: Secondary | ICD-10-CM | POA: Diagnosis not present

## 2015-06-27 DIAGNOSIS — M25551 Pain in right hip: Secondary | ICD-10-CM | POA: Diagnosis not present

## 2015-06-27 DIAGNOSIS — M545 Low back pain, unspecified: Secondary | ICD-10-CM

## 2015-06-27 DIAGNOSIS — M797 Fibromyalgia: Secondary | ICD-10-CM | POA: Diagnosis not present

## 2015-06-27 DIAGNOSIS — M1990 Unspecified osteoarthritis, unspecified site: Secondary | ICD-10-CM

## 2015-06-27 DIAGNOSIS — I10 Essential (primary) hypertension: Secondary | ICD-10-CM

## 2015-06-27 MED ORDER — DULOXETINE HCL 30 MG PO CPEP: 30.0000 mg | ORAL_CAPSULE | Freq: Every day | ORAL | Status: DC

## 2015-06-27 NOTE — Addendum Note (Signed)
Addended by: Ellamae Sia on: 06/27/2015 03:52 PM   Modules accepted: Orders

## 2015-06-27 NOTE — Assessment & Plan Note (Signed)
Deteriorated Support offered today She reports that Norco is the only thing that works-advised her she would need a referral to a pain clinic She would rather try Cymbalta again

## 2015-06-27 NOTE — Assessment & Plan Note (Signed)
Multiple joints Deteriorated Will check xray of thoracic and lumbar spine Will check Xray of right hip today Continue Advil prn

## 2015-06-27 NOTE — Assessment & Plan Note (Signed)
BP fine today Not medicated  Will continue to monitor

## 2015-06-27 NOTE — Patient Instructions (Signed)

## 2015-06-27 NOTE — Progress Notes (Signed)
HPI  Pt presents to the clinic today to establish care and for management of the conditions listed below. She used to see Dr. Deborra Medina many years ago.  Fibromyalgia: Diagnosed 10 years ago. She takes Ibuprofen 800 mg every 4 hours. She did see a rheumatologist in the past for this. She did take Lyrica in the past but it made her feel like she was detached. She has been on Cymbalta and Tramadol in the past but reports it did not help.  Depression: Chronic. She does not feel like it is well controlled. Her depression is triggered by family situations. She has been treated with antidepressants in the past (4-5 different ones in the past, she can not remember the names, but they gave her headache. She has seen a therapist in the past but does not feel like it was beneficial.  Arthritis: She has it in her neck, shoulders and hands. She takes Ibuprofen daily for this and reports it does not provide much relief. She does report worsening pain in her back and right hip. This started 1 year ago but seems to be getting worse. It is starting to impair her gait. She denies any injury to the area.  Flu: gets yearly through her employer  Tetanus: 2012 Pap Smear: 2011- normal Mammogram: 06/2015 Colon Screening: never Vision Screening: 06/2015 Dentist: as needed  Past Medical History  Diagnosis Date  . Fibromyalgia   . Depression   . Heart murmur   . Arthritis     Current Outpatient Prescriptions  Medication Sig Dispense Refill  . ibuprofen (ADVIL,MOTRIN) 200 MG tablet Take 800 mg by mouth every 4 (four) hours as needed.      No current facility-administered medications for this visit.    Allergies  Allergen Reactions  . Naproxen Palpitations  . Erythromycin Nausea And Vomiting  . Tramadol Hcl Other (See Comments)    Headaches    No family history on file.  Social History   Social History  . Marital Status: Married    Spouse Name: N/A  . Number of Children: N/A  . Years of Education: N/A    Occupational History  . Not on file.   Social History Main Topics  . Smoking status: Current Every Day Smoker -- 0.25 packs/day    Types: Cigarettes  . Smokeless tobacco: Former Systems developer  . Alcohol Use: No  . Drug Use: No  . Sexual Activity: Yes    Birth Control/ Protection: Post-menopausal   Other Topics Concern  . Not on file   Social History Narrative    ROS:  Constitutional: Pt reports fatigue. Denies fever, malaise, headache or abrupt weight changes.  HEENT: Denies eye pain, eye redness, ear pain, ringing in the ears, wax buildup, runny nose, nasal congestion, bloody nose, or sore throat. Respiratory: Denies difficulty breathing, shortness of breath, cough or sputum production.   Cardiovascular: Denies chest pain, chest tightness, palpitations or swelling in the hands or feet.  Gastrointestinal: Denies abdominal pain, bloating, constipation, diarrhea or blood in the stool.  GU: Denies frequency, urgency, pain with urination, blood in urine, odor or discharge. Musculoskeletal: Pt reports muscle and joint pain. Denies decrease in range of motion or joint swelling.  Skin: Denies redness, rashes, lesions or ulcercations.  Neurological: Denies dizziness, difficulty with memory, difficulty with speech or problems with balance and coordination.  Psych: Pt reports depression. Denies anxiety, SI/HI.  No other specific complaints in a complete review of systems (except as listed in HPI above).  PE:  BP 124/90 mmHg  Pulse 89  Temp(Src) 98 F (36.7 C) (Oral)  Ht 5' 3.75" (1.619 m)  Wt 216 lb (97.977 kg)  BMI 37.38 kg/m2  SpO2 98%  LMP 06/22/2015 Wt Readings from Last 3 Encounters:  06/27/15 216 lb (97.977 kg)  04/03/15 214 lb 3.2 oz (97.16 kg)  08/16/11 208 lb 4 oz (94.462 kg)    General: Appears her stated age, obese in NAD. Cardiovascular: Normal rate and rhythm. S1,S2 noted.   Pulmonary/Chest: Normal effort and positive vesicular breath sounds. No respiratory distress.  No wheezes, rales or ronchi noted.  Musculoskeletal: Normal flexion, extension and rotation of the spine. Pain with palpation of the thoracic and lumbar spine. Normal internal and external rotation of the left hip. Decreased internal and external rotation of the right hip secondary to pain. She is limping when she walks. Neurological: Alert and oriented.  Psychiatric: She is tearful. Her affect is mildly flat. She does engage though.  BMET    Component Value Date/Time   NA 136 04/03/2015 1836   K 3.6 04/03/2015 1836   CL 105 04/03/2015 1836   CO2 22 04/03/2015 1836   GLUCOSE 94 04/03/2015 1836   BUN 13 04/03/2015 1836   CREATININE 0.86 04/03/2015 1836   CALCIUM 9.1 04/03/2015 1836   GFRNONAA >60 04/03/2015 1836   GFRAA >60 04/03/2015 1836    Lipid Panel     Component Value Date/Time   CHOL 144 04/20/2009 0913   TRIG 78.0 04/20/2009 0913   HDL 44.50 04/20/2009 0913   CHOLHDL 3 04/20/2009 0913   VLDL 15.6 04/20/2009 0913   LDLCALC 84 04/20/2009 0913    CBC    Component Value Date/Time   WBC 13.5* 04/03/2015 1836   RBC 4.59 04/03/2015 1836   HGB 13.9 04/03/2015 1836   HCT 39.7 04/03/2015 1836   PLT 320 04/03/2015 1836   MCV 86.5 04/03/2015 1836   MCH 30.3 04/03/2015 1836   MCHC 35.0 04/03/2015 1836   RDW 13.1 04/03/2015 1836   LYMPHSABS 2.8 04/03/2015 1836   MONOABS 1.2* 04/03/2015 1836   EOSABS 0.1 04/03/2015 1836   BASOSABS 0.0 04/03/2015 1836    Hgb A1C Lab Results  Component Value Date   HGBA1C 5.7 01/04/2011     Assessment and Plan:

## 2015-06-27 NOTE — Progress Notes (Signed)
Pre visit review using our clinic review tool, if applicable. No additional management support is needed unless otherwise documented below in the visit note. 

## 2015-06-28 ENCOUNTER — Other Ambulatory Visit: Payer: Self-pay | Admitting: Internal Medicine

## 2015-06-28 DIAGNOSIS — M1611 Unilateral primary osteoarthritis, right hip: Secondary | ICD-10-CM

## 2015-06-30 ENCOUNTER — Telehealth: Payer: Self-pay | Admitting: *Deleted

## 2015-06-30 NOTE — Telephone Encounter (Signed)
Patient called requesting a copy of xrays from 10/10 appointment with [facility] for ortho appointment scheduled on 10/17.  Xrays burned to disc and placed up front for pick up.  Patient notified via voice mail per DPR.

## 2015-07-26 ENCOUNTER — Encounter: Payer: Self-pay | Admitting: Internal Medicine

## 2015-07-26 ENCOUNTER — Ambulatory Visit (INDEPENDENT_AMBULATORY_CARE_PROVIDER_SITE_OTHER): Payer: BLUE CROSS/BLUE SHIELD | Admitting: Internal Medicine

## 2015-07-26 VITALS — BP 126/88 | HR 82 | Temp 98.1°F | Wt 215.0 lb

## 2015-07-26 DIAGNOSIS — L03012 Cellulitis of left finger: Secondary | ICD-10-CM | POA: Diagnosis not present

## 2015-07-26 DIAGNOSIS — M25551 Pain in right hip: Secondary | ICD-10-CM

## 2015-07-26 DIAGNOSIS — IMO0001 Reserved for inherently not codable concepts without codable children: Secondary | ICD-10-CM

## 2015-07-26 MED ORDER — DOXYCYCLINE HYCLATE 100 MG PO TABS: 100.0000 mg | ORAL_TABLET | Freq: Two times a day (BID) | ORAL | Status: DC

## 2015-07-26 MED ORDER — LIDOCAINE 5 % EX PTCH: 1.0000 | MEDICATED_PATCH | CUTANEOUS | Status: DC

## 2015-07-26 NOTE — Progress Notes (Signed)
Pre visit review using our clinic review tool, if applicable. No additional management support is needed unless otherwise documented below in the visit note. 

## 2015-07-26 NOTE — Patient Instructions (Signed)
Paronychia °Paronychia is an infection of the skin that surrounds a nail. It usually affects the skin around a fingernail, but it may also occur near a toenail. It often causes pain and swelling around the nail. This condition may come on suddenly or develop over a longer period. In some cases, a collection of pus (abscess) can form near or under the nail. Usually, paronychia is not serious and it clears up with treatment. °CAUSES °This condition may be caused by bacteria or fungi. It is commonly caused by either Streptococcus or Staphylococcus bacteria. The bacteria or fungi often cause the infection by getting into the affected area through an opening in the skin, such as a cut or a hangnail. °RISK FACTORS °This condition is more likely to develop in: °· People who get their hands wet often, such as those who work as dishwashers, bartenders, or nurses. °· People who bite their fingernails or suck their thumbs. °· People who trim their nails too short. °· People who have hangnails or injured fingertips. °· People who get manicures. °· People who have diabetes. °SYMPTOMS °Symptoms of this condition include: °· Redness and swelling of the skin near the nail. °· Tenderness around the nail when you touch the area. °· Pus-filled bumps under the cuticle. The cuticle is the skin at the base or sides of the nail. °· Fluid or pus under the nail. °· Throbbing pain in the area. °DIAGNOSIS °This condition is usually diagnosed with a physical exam. In some cases, a sample of pus may be taken from an abscess to be tested in a lab. This can help to determine what type of bacteria or fungi is causing the condition. °TREATMENT °Treatment for this condition depends on the cause and severity of the condition. If the condition is mild, it may clear up on its own in a few days. Your health care provider may recommend soaking the affected area in warm water a few times a day. When treatment is needed, the options may  include: °· Antibiotic medicine, if the condition is caused by a bacterial infection. °· Antifungal medicine, if the condition is caused by a fungal infection. °· Incision and drainage, if an abscess is present. In this procedure, the health care provider will cut open the abscess so the pus can drain out. °HOME CARE INSTRUCTIONS °· Soak the affected area in warm water if directed to do so by your health care provider. You may be told to do this for 20 minutes, 2-3 times a day. Keep the area dry in between soakings. °· Take medicines only as directed by your health care provider. °· If you were prescribed an antibiotic medicine, finish all of it even if you start to feel better. °· Keep the affected area clean. °· Do not try to drain a fluid-filled bump yourself. °· If you will be washing dishes or performing other tasks that require your hands to get wet, wear rubber gloves. You should also wear gloves if your hands might come in contact with irritating substances, such as cleaners or chemicals. °· Follow your health care provider's instructions about: °¨ Wound care. °¨ Bandage (dressing) changes and removal. °SEEK MEDICAL CARE IF: °· Your symptoms get worse or do not improve with treatment. °· You have a fever or chills. °· You have redness spreading from the affected area. °· You have continued or increased fluid, blood, or pus coming from the affected area. °· Your finger or knuckle becomes swollen or is difficult to move. °  °  This information is not intended to replace advice given to you by your health care provider. Make sure you discuss any questions you have with your health care provider. °  °Document Released: 02/27/2001 Document Revised: 01/18/2015 Document Reviewed: 08/11/2014 °Elsevier Interactive Patient Education ©2016 [employer] ° °

## 2015-07-26 NOTE — Progress Notes (Signed)
   Subjective:    Patient ID: Stacy [name], female    DOB: Feb 12, 1961, 54 y.o.   MRN: [id number]  HPI Stacy [name] is a 54 year old female who presents today with chief complaint of pain in her middle finger on her left hand. It is warm to touch, swollen and has redness. This began Sunday. Denies injury. Had the same symptoms on her thumb on right hand in August and it turned black and she went to the ED and it was drained.      Review of Systems  Constitutional: Negative for fever, chills and fatigue.  Respiratory: Negative for cough, shortness of breath and wheezing.   Cardiovascular: Negative for chest pain, palpitations and leg swelling.  Neurological: Negative for dizziness, light-headedness and headaches.   Family History  Problem Relation Age of Onset  . Adopted: Yes   Current Outpatient Prescriptions on File Prior to Visit  Medication Sig Dispense Refill  . ibuprofen (ADVIL,MOTRIN) 200 MG tablet Take 800 mg by mouth every 4 (four) hours as needed.      No current facility-administered medications on file prior to visit.        Objective:   Physical Exam  Constitutional: She appears well-developed and well-nourished.  HENT:  Head: Normocephalic and atraumatic.  Eyes: Pupils are equal, round, and reactive to light.  Neck: Normal range of motion. Neck supple.  Cardiovascular: Normal rate, regular rhythm and normal heart sounds.   Pulmonary/Chest: Effort normal and breath sounds normal.  Musculoskeletal:  Erythema surrounding nailbed of left middle finger at nailbed.      BP 126/88 mmHg  Pulse 82  Temp(Src) 98.1 F (36.7 C) (Oral)  Wt 215 lb (97.523 kg)  SpO2 98%  LMP 06/26/2015      Assessment & Plan:  1. Paronychia Rx for Doxycycline 100mg  po BID x 7 days. Continue to soak it at night.  2. Hip pain Patient is going to have hip replacement surgery in December.  Would like to try Lidoderm patches for her hip pain.  Will eRx for her.

## 2015-07-26 NOTE — Progress Notes (Signed)
Subjective:    Patient ID: Stacy [name], female    DOB: 26-Sep-1960, 54 y.o.   MRN: [id number]  HPI  Pt presents to the clinic today with c/o pain and swelling of the middle finger on her left hand. The area is red, warm and tender to touch. It has not drained. She denies fever, chills or body aches. She has tried soaking it in epsom salt without relief.   She also reports she plans on having hip surgery soon. She has been using Salon Pas pads with good relief. She reports they have been working well for her but they are very expensive. She would like a RX for Lidoderm patches.  Review of Systems      Past Medical History  Diagnosis Date  . Fibromyalgia   . Depression   . Heart murmur   . Arthritis     Current Outpatient Prescriptions  Medication Sig Dispense Refill  . ibuprofen (ADVIL,MOTRIN) 200 MG tablet Take 800 mg by mouth every 4 (four) hours as needed.      No current facility-administered medications for this visit.    Allergies  Allergen Reactions  . Naproxen Palpitations  . Erythromycin Nausea And Vomiting  . Tramadol Hcl Other (See Comments)    Headaches    Family History  Problem Relation Age of Onset  . Adopted: Yes    Social History   Social History  . Marital Status: Married    Spouse Name: N/A  . Number of Children: N/A  . Years of Education: N/A   Occupational History  . Not on file.   Social History Main Topics  . Smoking status: Current Every Day Smoker -- 0.25 packs/day    Types: Cigarettes  . Smokeless tobacco: Former Systems developer  . Alcohol Use: No  . Drug Use: No  . Sexual Activity: Yes    Birth Control/ Protection: Post-menopausal   Other Topics Concern  . Not on file   Social History Narrative     Constitutional: Denies fever, malaise, fatigue, headache or abrupt weight changes.  Respiratory: Denies difficulty breathing, shortness of breath, cough or sputum production.   Cardiovascular: Denies chest pain, chest tightness,  palpitations or swelling in the hands or feet.  Skin: Pt reports redness, swelling and pain around cuticle of left third finger. Denies rashes, lesions or ulcercations.  MSK: Pt reports right hip pain. Denies decrease in range of motion, muscle pain, difficulty with gait or joint swelling.  No other specific complaints in a complete review of systems (except as listed in HPI above).  Objective:   Physical Exam  BP 126/88 mmHg  Pulse 82  Temp(Src) 98.1 F (36.7 C) (Oral)  Wt 215 lb (97.523 kg)  SpO2 98%  LMP 06/26/2015 Wt Readings from Last 3 Encounters:  07/26/15 215 lb (97.523 kg)  06/27/15 216 lb (97.977 kg)  04/03/15 214 lb 3.2 oz (97.16 kg)    General: Appears her stated age, obese in NAD. Skin: Warm, dry and intact. Paronychia noted of left middle finger. MSK: Normal internal and external rotation of the right hip. Pain with palpation over the right trochanter. No clicking or popping noted. No difficulty with gait.  BMET    Component Value Date/Time   NA 136 04/03/2015 1836   K 3.6 04/03/2015 1836   CL 105 04/03/2015 1836   CO2 22 04/03/2015 1836   GLUCOSE 94 04/03/2015 1836   BUN 13 04/03/2015 1836   CREATININE 0.86 04/03/2015 1836   CALCIUM 9.1  04/03/2015 1836   GFRNONAA >60 04/03/2015 1836   GFRAA >60 04/03/2015 1836    Lipid Panel     Component Value Date/Time   CHOL 144 04/20/2009 0913   TRIG 78.0 04/20/2009 0913   HDL 44.50 04/20/2009 0913   CHOLHDL 3 04/20/2009 0913   VLDL 15.6 04/20/2009 0913   LDLCALC 84 04/20/2009 0913    CBC    Component Value Date/Time   WBC 13.5* 04/03/2015 1836   RBC 4.59 04/03/2015 1836   HGB 13.9 04/03/2015 1836   HCT 39.7 04/03/2015 1836   PLT 320 04/03/2015 1836   MCV 86.5 04/03/2015 1836   MCH 30.3 04/03/2015 1836   MCHC 35.0 04/03/2015 1836   RDW 13.1 04/03/2015 1836   LYMPHSABS 2.8 04/03/2015 1836   MONOABS 1.2* 04/03/2015 1836   EOSABS 0.1 04/03/2015 1836   BASOSABS 0.0 04/03/2015 1836    Hgb A1C Lab  Results  Component Value Date   HGBA1C 5.7 01/04/2011         Assessment & Plan:   Paronychia:  eRx for Doxycycline 100 mg BID x 10 days Continue Epsom salt soaks  Right hip pain:  Pending upcoming surgery eRx for Lidoderm patches  RTC as needed or if symptoms persist or worsen

## 2015-08-03 ENCOUNTER — Telehealth: Payer: Self-pay

## 2015-08-03 NOTE — Telephone Encounter (Signed)
PA has been submitted through [email or web address] 

## 2015-08-31 ENCOUNTER — Other Ambulatory Visit (HOSPITAL_COMMUNITY): Payer: Self-pay | Admitting: Orthopaedic Surgery

## 2015-09-05 ENCOUNTER — Encounter (HOSPITAL_COMMUNITY)
Admission: RE | Admit: 2015-09-05 | Discharge: 2015-09-05 | Disposition: A | Discharge status: Home / Self Care | Payer: BLUE CROSS/BLUE SHIELD | Source: Ambulatory Visit | Attending: Orthopaedic Surgery | Admitting: Orthopaedic Surgery

## 2015-09-05 ENCOUNTER — Encounter (HOSPITAL_COMMUNITY): Payer: Self-pay

## 2015-09-05 DIAGNOSIS — Z0183 Encounter for blood typing: Secondary | ICD-10-CM | POA: Diagnosis not present

## 2015-09-05 DIAGNOSIS — Z01818 Encounter for other preprocedural examination: Secondary | ICD-10-CM | POA: Insufficient documentation

## 2015-09-05 DIAGNOSIS — Z01812 Encounter for preprocedural laboratory examination: Secondary | ICD-10-CM | POA: Diagnosis not present

## 2015-09-05 DIAGNOSIS — M1611 Unilateral primary osteoarthritis, right hip: Secondary | ICD-10-CM | POA: Insufficient documentation

## 2015-09-05 DIAGNOSIS — M797 Fibromyalgia: Secondary | ICD-10-CM | POA: Diagnosis not present

## 2015-09-05 DIAGNOSIS — R011 Cardiac murmur, unspecified: Secondary | ICD-10-CM | POA: Insufficient documentation

## 2015-09-05 HISTORY — DX: Headache, unspecified: R51.9

## 2015-09-05 HISTORY — DX: Nonrheumatic mitral (valve) prolapse: I34.1

## 2015-09-05 HISTORY — DX: Other complications of anesthesia, initial encounter: T88.59XA

## 2015-09-05 HISTORY — DX: Headache: R51

## 2015-09-05 HISTORY — DX: Adverse effect of unspecified anesthetic, initial encounter: T41.45XA

## 2015-09-05 LAB — CBC WITH DIFFERENTIAL/PLATELET
BASOS ABS: 0 10*3/uL (ref 0.0–0.1)
Basophils Relative: 0 %
EOS ABS: 0.2 10*3/uL (ref 0.0–0.7)
Eosinophils Relative: 2 %
HCT: 44.1 % (ref 36.0–46.0)
HEMOGLOBIN: 14.7 g/dL (ref 12.0–15.0)
LYMPHS ABS: 2.1 10*3/uL (ref 0.7–4.0)
LYMPHS PCT: 19 %
MCH: 29.9 pg (ref 26.0–34.0)
MCHC: 33.3 g/dL (ref 30.0–36.0)
MCV: 89.6 fL (ref 78.0–100.0)
Monocytes Absolute: 0.8 10*3/uL (ref 0.1–1.0)
Monocytes Relative: 7 %
NEUTROS PCT: 72 %
Neutro Abs: 8.2 10*3/uL — ABNORMAL HIGH (ref 1.7–7.7)
Platelets: 298 10*3/uL (ref 150–400)
RBC: 4.92 MIL/uL (ref 3.87–5.11)
RDW: 13.1 % (ref 11.5–15.5)
WBC: 11.3 10*3/uL — AB (ref 4.0–10.5)

## 2015-09-05 LAB — URINALYSIS, ROUTINE W REFLEX MICROSCOPIC
Bilirubin Urine: NEGATIVE
Glucose, UA: NEGATIVE mg/dL
KETONES UR: NEGATIVE mg/dL
LEUKOCYTES UA: NEGATIVE
NITRITE: NEGATIVE
PH: 5 (ref 5.0–8.0)
Protein, ur: NEGATIVE mg/dL
SPECIFIC GRAVITY, URINE: 1.009 (ref 1.005–1.030)

## 2015-09-05 LAB — COMPREHENSIVE METABOLIC PANEL
ALK PHOS: 60 U/L (ref 38–126)
ALT: 18 U/L (ref 14–54)
AST: 20 U/L (ref 15–41)
Albumin: 4.2 g/dL (ref 3.5–5.0)
Anion gap: 10 (ref 5–15)
BUN: 15 mg/dL (ref 6–20)
CALCIUM: 9.6 mg/dL (ref 8.9–10.3)
CO2: 23 mmol/L (ref 22–32)
CREATININE: 0.86 mg/dL (ref 0.44–1.00)
Chloride: 110 mmol/L (ref 101–111)
GFR calc non Af Amer: >60 mL/min (ref >60)
Glucose, Bld: 94 mg/dL (ref 65–99)
Potassium: 4.3 mmol/L (ref 3.5–5.1)
SODIUM: 143 mmol/L (ref 135–145)
Total Bilirubin: 0.5 mg/dL (ref 0.3–1.2)
Total Protein: 7.2 g/dL (ref 6.5–8.1)

## 2015-09-05 LAB — APTT: APTT: 31 s (ref 24–37)

## 2015-09-05 LAB — URINE MICROSCOPIC-ADD ON

## 2015-09-05 LAB — TYPE AND SCREEN
ABO/RH(D): O POS
Antibody Screen: NEGATIVE

## 2015-09-05 LAB — C-REACTIVE PROTEIN: CRP: <0.5 mg/dL (ref <1.0)

## 2015-09-05 LAB — SEDIMENTATION RATE: SED RATE: 13 mm/h (ref 0–22)

## 2015-09-05 LAB — SURGICAL PCR SCREEN
MRSA, PCR: NEGATIVE
STAPHYLOCOCCUS AUREUS: NEGATIVE

## 2015-09-05 LAB — ABO/RH: ABO/RH(D): O POS

## 2015-09-05 LAB — PROTIME-INR
INR: 1.03 (ref 0.00–1.49)
Prothrombin Time: 13.7 seconds (ref 11.6–15.2)

## 2015-09-05 LAB — HCG, SERUM, QUALITATIVE: PREG SERUM: NEGATIVE

## 2015-09-05 NOTE — Progress Notes (Addendum)
Anesthesia PAT Evaluation: Patient is a 54 year old female scheduled for right THA, anterior approach on 09/14/15 by Dr. Erlinda Hong.   History includes smoking, congenital mitral valve disorder s/p "repair" in ~ 1971 ([facility]), murmur, depression, fibromyalgia, arthritis (saw Dr. Dossie Der at [facility]), migraines, rotator cuff repair 06/16/01 [facility], Dr. Percell Miller), C4-6 ACDF 05/16/10 [facility], Dr. Carloyn Manner), pyelonephritis '12. In 2011, reported she had seen Dr. Rollene Fare (formerly with [facility]) but it had been > 5 years. I don't see that we have any cardiology records in [place], or scanned under the Media tab.  PCP is Webb Silversmith, NP, newly established 06/27/15 but had previously been followed by Dr. Deborra Medina at the same office. (She had to come back as a "new" patient due to no follow-up during a time of her insurance lapse.) Patient cell number is [phone].  PAT Vitals: BP 187/102 LUE, recheck 169/119 RUE--however, manual BP after her PAT appointment was 138/72 LUE. BP 124/90 on 06/27/15 and 126/88 on 07/26/15.  She rates her hip pain at a scale of 8/10 this afternoon.   09/05/15 EKG: NSR, non-specific T wave abnormality, new since previous tracing.  Preoperative labs noted.   Evaluated patient at PAT. She denied chest pain, SOB, edema. Has been limited in activity for at least a year due to her hip. 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 Was on limited activity (avoided contact sports) as a child (more due to sternotomy per patient) but says she has never been told she should limit her activities as an adult. Has been told she has a persistent murmur. Cannot remember the last time she was evaluated by a cardiologist. Denied recent testing. Reported a cath at around age 53 or 56. Reported a LUE "venous" injury as a young child related to a "clot." Denied knowledge of being on a blood thinner. Heart RRR, II/VI SEM heard LSB. She has a "T" shaped scar on her chest. Lungs clear. No  pitting edema of the ankle. No carotid bruits noted.   Discussed with patient that she will likely need to see a cardiologist with echo if we are unable to get any cardiology records. Also discussed that if her BP is significantly elevated on arrival that her surgery could be canceled. She thinks she may have "white coat syndrome".  We certainly did get a dramatically increased BP on arrival as compared to her BP after I finished talking with her. I will attempt to get records from [place] (was known as [facility] then), but I am not expecting records in a timely manner due to the long passage of time. I did call (spoke with Jeani Hawking) Dr. Lowella Fairy former office and all of his records are now in storage and would take days to access. Discussed with anesthesiologist Dr. Therisa Doyne, recommend pre-operative cardiology evaluation since she has had known cardiac surgery (valve repair?) and no recent follow-up or echo. They can also re-evaluate BP at that time.  I have notified Sherrie at Dr. Phoebe Sharps office.   George Hugh [facility] Short Stay Center/Anesthesiology Phone [phone] 09/05/2015 5:20 PM  Addendum: Patient was evaluated by cardiologist Dr. Wynonia Lawman on 09/09/15 for a pre-operative evaluation. BP then was 136/98 (RUE, sitting) and 142/94 (RUE, standing). He is aware of her prior childhood MV surgery. He writes: 1. From a cardiovascular viewpoint may proceed with the planned hip surgery. She clinically may undergo hip surgery and needs no further cardiovascular workup prior to surgery. Following  surgery she can have an echocardiogram done. Her exam is fine and there is no reason to delay surgery in order to get this done. [He actually wants the echo done before she leaves the hospital, and requests that Dr. Erlinda Hong let him know when she is hospitalized. At this time, no additional records have been received from [facility].] I updated anesthesiologist Dr. Marcie Bal.  George Hugh [facility] Short  Stay Center/Anesthesiology Phone [phone] 09/13/2015 12:30 PM

## 2015-09-05 NOTE — Progress Notes (Signed)
SPOKE WITH ALLISON ABOUT ELEVATED BP 169/119, PATIENT IN PAIN AND VERY NERVOUS.  ALSO MADE ALLISON  AWARE OF PATIENT'S CARDIAC HISTORY- PATIENT STATES SHE HAD A VALVE REPAIR AT [facility].  PATIENT STATES SHE HAS NOT SEEN CARDIOLOGIST IN 12-15 YEARS AND NO ECHO SINCE THEN.  PATIENT SEE PA AT [employer] AT [facility].

## 2015-09-05 NOTE — Pre-Procedure Instructions (Signed)
Stacy [name]  09/05/2015      [id number] [place], [place] - [address] [place] [address] Phone: [phone] Fax: [phone]    Your procedure is scheduled on   Wednesday  09/14/15  Report to [facility] Admitting at 1045 A.M.  Call this number if you have problems the morning of surgery:  [phone]   Remember:  Do not eat food or drink liquids after midnight.  Take these medicines the morning of surgery with A SIP OF WATER     (STOP IBUPROFEN/ ADVIL/ MOTRIN, no herbal medicines or VITAMINS, NO ASPIRIN PRODUCTS, NO GOODY POWDERS/ BC'S)   Do not wear jewelry, make-up or nail polish.  Do not wear lotions, powders, or perfumes.  You may wear deodorant.  Do not shave 48 hours prior to surgery.  Men may shave face and neck.  Do not bring valuables to the hospital.  [facility] is not responsible for any belongings or valuables.  Contacts, dentures or bridgework may not be worn into surgery.  Leave your suitcase in the car.  After surgery it may be brought to your room.  For patients admitted to the hospital, discharge time will be determined by your treatment team.  Patients discharged the day of surgery will not be allowed to drive home.   Name and phone number of your driver:    Special instructions:  [name] - Preparing for Surgery  Before surgery, you can play an important role.  Because skin is not sterile, your skin needs to be as free of germs as possible.  You can reduce the number of germs on you skin by washing with CHG (chlorahexidine gluconate) soap before surgery.  CHG is an antiseptic cleaner which kills germs and bonds with the skin to continue killing germs even after washing.  Please DO NOT use if you have an allergy to CHG or antibacterial soaps.  If your skin becomes reddened/irritated stop using the CHG and inform your nurse when you arrive at Short Stay.  Do not shave  (including legs and underarms) for at least 48 hours prior to the first CHG shower.  You may shave your face.  Please follow these instructions carefully:   1.  Shower with CHG Soap the night before surgery and the                                morning of Surgery.  2.  If you choose to wash your hair, wash your hair first as usual with your       normal shampoo.  3.  After you shampoo, rinse your hair and body thoroughly to remove the                      Shampoo.  4.  Use CHG as you would any other liquid soap.  You can apply chg directly       to the skin and wash gently with scrungie or a clean washcloth.  5.  Apply the CHG Soap to your body ONLY FROM THE NECK DOWN.        Do not use on open wounds or open sores.  Avoid contact with your eyes,       ears, mouth and genitals (private parts).  Wash genitals (private parts)  with your normal soap.  6.  Wash thoroughly, paying special attention to the area where your surgery        will be performed.  7.  Thoroughly rinse your body with warm water from the neck down.  8.  DO NOT shower/wash with your normal soap after using and rinsing off       the CHG Soap.  9.  Pat yourself dry with a clean towel.            10.  Wear clean pajamas.            11.  Place clean sheets on your bed the night of your first shower and do not        sleep with pets.  Day of Surgery  Do not apply any lotions/deoderants the morning of surgery.  Please wear clean clothes to the hospital/surgery center.    Please read over the following fact sheets that you were given. Pain Booklet, Coughing and Deep Breathing, Blood Transfusion Information, MRSA Information and Surgical Site Infection Prevention

## 2015-09-06 ENCOUNTER — Telehealth: Payer: Self-pay | Admitting: Internal Medicine

## 2015-09-06 NOTE — Telephone Encounter (Signed)
Patient is having surgery for a hip replacement on 09/14/15, and the anesthesiologist said because of her blood pressure and heart history he wants her to have an echocardiogram before surgery.  So she needs a referral to a doctor or facility that she can have that done.

## 2015-09-06 NOTE — Telephone Encounter (Signed)
Left detailed msg on VM per HIPAA  

## 2015-09-06 NOTE — Telephone Encounter (Signed)
I can order it. She needs to make an appt for preop screening

## 2015-09-09 ENCOUNTER — Encounter: Payer: Self-pay | Admitting: Cardiology

## 2015-09-09 NOTE — Progress Notes (Unsigned)
Patient ID: PAIZLIE KAUFFMANN, female   DOB: [birth year], 54 y.o.   MRN: [id number]  Edelynn, Hyder    Date of visit:  09/09/2015 DOB:  October 26, 1960    Age:  54 yrs. Medical record number:  [id number]     Account number:  [id number] Primary Care Provider: Marianna Payment ____________________________ CURRENT DIAGNOSES  1. Encounter for preprocedural cardiovascular examination  2. Mitral valve repair  3. Essential hypertension  4. Obesity ____________________________ ALLERGIES  Erythromycin Base, Nausea  Gabapentin, Intolerance-unknown  Naproxen, Palpitations  Tramadol, Headache ____________________________ MEDICATIONS  1. hydrocodone 5 mg-acetaminophen 325 mg tablet, PRN ____________________________ CHIEF COMPLAINTS  Surg clearance hip replacement ____________________________ HISTORY OF PRESENT ILLNESS This very nice 54 year old female is seen for preoperative cardiac evaluation. The patient is due to have an anterior hip replacement done next week and was asked to have cardiology clearance preoperatively. The patient in 75 and had what was described as repair of the mitral valve at [facility] for congenital heart disease of some sort. She later followed up with Dr. Rollene Fare as well as Dr. Tamala Julian in [place] but has not seen a cardiologist in some time. She is limited because of her hip and has occasional palpitations. She describes herself as having fibromyalgia. She normally does not have chest pain or shortness of breath and denies PND, orthopnea, syncope, palpitations, or claudication. She has gained about 30 pounds of weight in the past year. She has a history of hypertension but is not currently taking any antihypertensive medications. She was told that she might need to have her repeat echo prior to her surgery. ____________________________ PAST HISTORY  Past Medical Illnesses:  hypertension, fibromyalgia, obesity;  Cardiovascular Illnesses:  mitral valve repair;  Surgical Procedures:   laminectomy cervical, mitral valve repair at 8, shoulder surgery-rt, tubal ligation;  NYHA Classification:  I;  Canadian Angina Classification:  Class 0: Asymptomatic;  Cardiology Procedures-Invasive:  cardiac cath (right and left);  LVEF not documented,   ____________________________ CARDIO-PULMONARY TEST DATES EKG Date:  09/09/2015;   ____________________________ FAMILY HISTORY Brother -- Brother alive with problem, Chronic obstructive lung disease Father -- Family history unknown Mother -- Family history unknown Sister -- Sister alive and well ____________________________ SOCIAL HISTORY Alcohol Use:  no alcohol use;  Smoking:  smokes less than 1 ppd, 25 pack year history;  Lifestyle:  divorced, remarried and separated;  Exercise:  exercise is limited due to physical disability;  Occupation:  Airline pilot;  Residence:  lives alone;   ____________________________ REVIEW OF SYSTEMS General:  obesity, weight gain of approximately 30 lbs  Integumentary:no rashes or new skin lesions. Eyes: denies diplopia, history of glaucoma or visual problems. Ears, Nose, Throat, Mouth:  denies any hearing loss, epistaxis, hoarseness or difficulty speaking. Respiratory: denies dyspnea, cough, wheezing or hemoptysis. Cardiovascular:  please review HPI Abdominal: denies dyspepsia, GI bleeding, constipation, or diarrheaGenitourinary-Female: no dysuria, urgency, frequency, UTIs, or stress incontinence Musculoskeletal:  fibromyalgia Neurological:  denies headaches, stroke, or TIA Psychiatric:  situational stress  ____________________________ PHYSICAL EXAMINATION VITAL SIGNS  Blood Pressure:  136/98 Sitting, Right arm, large cuff  , 142/94 Standing, Right arm and large cuff   Pulse:  80/min. Weight:  219.00 lbs. Height:  64"BMI: 37  Constitutional:  pleasant white female, in no acute distress, moderately obese Skin:  warm and dry to touch, no apparent skin lesions, or masses noted. Head:  normocephalic, normal  hair pattern, no masses or tenderness Eyes:  EOMS Intact, PERRLA, C and S clear, Funduscopic exam not done. ENT:  ears, nose and throat reveal no gross abnormalities.  Dentition good. Neck:  supple, without massess. No JVD, thyromegaly or carotid bruits. Carotid upstroke normal. Chest:  normal symmetry, clear to auscultation. Cardiac:  regular rhythm, normal S1 and S2, No S3 or S4, no murmurs, gallops or rubs detected. Abdomen:  abdomen soft,non-tender, no masses, no hepatospenomegaly, or aneurysm noted Peripheral Pulses:  the femoral,dorsalis pedis, and posterior tibial pulses are full and equal bilaterally with no bruits auscultated. Extremities & Back:  no deformities, clubbing, cyanosis, erythema or edema observed. Normal muscle strength and tone. Neurological:  no gross motor or sensory deficits noted, affect appropriate, oriented x3. ____________________________ IMPRESSIONS/PLAN  1. From a cardiovascular viewpoint may proceed with the planned hip surgery. She clinically may undergo hip surgery and needs no further cardiovascular workup prior to surgery. Following surgery she can have an echocardiogram done.  Her exam is fine and there is no reason to delay surgery in order to get this done. 2. Hypertension 3. Obesity 4. History of fibromyalgia 5. History of mitral valve repair presumably for congenital problem - records not currently available  Recommendations:  May proceed with surgery. I would  like her to have an echocardiogram done when she is in the hospital and we can arrange that when she is in following surgery. Please let me know when she is in. EKG shows voltage for LVH.   ____________________________ TODAYS ORDERS  1. 12 Lead EKG: Today                       ____________________________ Cardiology Physician:  Kerry Hough MD [facility]

## 2015-09-13 MED ORDER — CEFAZOLIN SODIUM-DEXTROSE 2-3 GM-% IV SOLR
2.0000 g | INTRAVENOUS | Status: AC
  Administered 2015-09-14: 2 g via INTRAVENOUS
  Filled 2015-09-13: qty 50

## 2015-09-13 MED ORDER — CHLORHEXIDINE GLUCONATE 4 % EX LIQD: 60.0000 mL | Freq: Once | CUTANEOUS | Status: DC

## 2015-09-13 MED ORDER — SODIUM CHLORIDE 0.9 % IV SOLN
1000.0000 mg | INTRAVENOUS | Status: AC
  Administered 2015-09-14: 1000 mg via INTRAVENOUS
  Filled 2015-09-13: qty 10

## 2015-09-14 ENCOUNTER — Inpatient Hospital Stay (HOSPITAL_COMMUNITY): Payer: BLUE CROSS/BLUE SHIELD

## 2015-09-14 ENCOUNTER — Encounter (HOSPITAL_COMMUNITY)
Admission: RE | Disposition: A | Discharge status: Home / Self Care | Payer: Self-pay | Source: Ambulatory Visit | Attending: Orthopaedic Surgery

## 2015-09-14 ENCOUNTER — Encounter (HOSPITAL_COMMUNITY): Payer: Self-pay | Admitting: Certified Registered Nurse Anesthetist

## 2015-09-14 ENCOUNTER — Inpatient Hospital Stay (HOSPITAL_COMMUNITY): Payer: BLUE CROSS/BLUE SHIELD | Admitting: Certified Registered Nurse Anesthetist

## 2015-09-14 ENCOUNTER — Inpatient Hospital Stay (HOSPITAL_COMMUNITY)
Admission: RE | Admit: 2015-09-14 | Discharge: 2015-09-17 | DRG: 470 | Disposition: A | Discharge status: Home / Self Care | Payer: BLUE CROSS/BLUE SHIELD | Source: Ambulatory Visit | Attending: Orthopaedic Surgery | Admitting: Orthopaedic Surgery

## 2015-09-14 ENCOUNTER — Inpatient Hospital Stay (HOSPITAL_COMMUNITY): Payer: BLUE CROSS/BLUE SHIELD | Admitting: Vascular Surgery

## 2015-09-14 DIAGNOSIS — Z886 Allergy status to analgesic agent status: Secondary | ICD-10-CM | POA: Diagnosis not present

## 2015-09-14 DIAGNOSIS — D62 Acute posthemorrhagic anemia: Secondary | ICD-10-CM | POA: Diagnosis not present

## 2015-09-14 DIAGNOSIS — M1610 Unilateral primary osteoarthritis, unspecified hip: Secondary | ICD-10-CM | POA: Diagnosis present

## 2015-09-14 DIAGNOSIS — Z881 Allergy status to other antibiotic agents status: Secondary | ICD-10-CM | POA: Diagnosis not present

## 2015-09-14 DIAGNOSIS — M797 Fibromyalgia: Secondary | ICD-10-CM | POA: Diagnosis present

## 2015-09-14 DIAGNOSIS — M1611 Unilateral primary osteoarthritis, right hip: Secondary | ICD-10-CM | POA: Diagnosis present

## 2015-09-14 DIAGNOSIS — I341 Nonrheumatic mitral (valve) prolapse: Secondary | ICD-10-CM | POA: Diagnosis present

## 2015-09-14 DIAGNOSIS — F17210 Nicotine dependence, cigarettes, uncomplicated: Secondary | ICD-10-CM | POA: Diagnosis present

## 2015-09-14 DIAGNOSIS — Z419 Encounter for procedure for purposes other than remedying health state, unspecified: Secondary | ICD-10-CM

## 2015-09-14 DIAGNOSIS — Z96649 Presence of unspecified artificial hip joint: Secondary | ICD-10-CM

## 2015-09-14 HISTORY — PX: TOTAL HIP ARTHROPLASTY: SHX124

## 2015-09-14 SURGERY — ARTHROPLASTY, HIP, TOTAL, ANTERIOR APPROACH
Anesthesia: Spinal | Site: Hip | Laterality: Right

## 2015-09-14 MED ORDER — DEXTROSE 5 % IV SOLN
500.0000 mg | Freq: Four times a day (QID) | INTRAVENOUS | Status: DC | PRN
  Filled 2015-09-14: qty 5

## 2015-09-14 MED ORDER — LACTATED RINGERS IV SOLN
INTRAVENOUS | Status: DC
  Administered 2015-09-14 (×2): via INTRAVENOUS

## 2015-09-14 MED ORDER — ACETAMINOPHEN 325 MG PO TABS: 650.0000 mg | ORAL_TABLET | Freq: Four times a day (QID) | ORAL | Status: DC | PRN

## 2015-09-14 MED ORDER — ALUM & MAG HYDROXIDE-SIMETH 200-200-20 MG/5ML PO SUSP: 30.0000 mL | ORAL | Status: DC | PRN

## 2015-09-14 MED ORDER — HYDROCODONE-ACETAMINOPHEN 7.5-325 MG PO TABS: 1.0000 | ORAL_TABLET | Freq: Once | ORAL | Status: DC | PRN

## 2015-09-14 MED ORDER — MIDAZOLAM HCL 2 MG/2ML IJ SOLN
INTRAMUSCULAR | Status: AC
  Filled 2015-09-14: qty 2

## 2015-09-14 MED ORDER — SODIUM CHLORIDE 0.9 % IV SOLN
10.0000 mg | INTRAVENOUS | Status: DC | PRN
  Administered 2015-09-14: 25 ug/min via INTRAVENOUS

## 2015-09-14 MED ORDER — HYDROMORPHONE HCL 1 MG/ML IJ SOLN
0.2500 mg | INTRAMUSCULAR | Status: DC | PRN
  Administered 2015-09-14 (×4): 0.5 mg via INTRAVENOUS

## 2015-09-14 MED ORDER — ACETAMINOPHEN 650 MG RE SUPP: 650.0000 mg | Freq: Four times a day (QID) | RECTAL | Status: DC | PRN

## 2015-09-14 MED ORDER — ASPIRIN EC 325 MG PO TBEC
325.0000 mg | DELAYED_RELEASE_TABLET | Freq: Two times a day (BID) | ORAL | Status: DC
  Administered 2015-09-14 – 2015-09-17 (×6): 325 mg via ORAL
  Filled 2015-09-14 (×5): qty 1

## 2015-09-14 MED ORDER — MORPHINE SULFATE (PF) 2 MG/ML IV SOLN
2.0000 mg | INTRAVENOUS | Status: DC | PRN
  Administered 2015-09-14 – 2015-09-15 (×6): 2 mg via INTRAVENOUS
  Filled 2015-09-14 (×6): qty 1

## 2015-09-14 MED ORDER — SODIUM CHLORIDE 0.9 % IV SOLN
INTRAVENOUS | Status: DC
  Administered 2015-09-14: 17:00:00 via INTRAVENOUS

## 2015-09-14 MED ORDER — ONDANSETRON HCL 4 MG/2ML IJ SOLN: 4.0000 mg | Freq: Four times a day (QID) | INTRAMUSCULAR | Status: DC | PRN

## 2015-09-14 MED ORDER — KETOROLAC TROMETHAMINE 30 MG/ML IJ SOLN
30.0000 mg | Freq: Four times a day (QID) | INTRAMUSCULAR | Status: AC | PRN
  Administered 2015-09-14: 30 mg via INTRAVENOUS
  Filled 2015-09-14: qty 1

## 2015-09-14 MED ORDER — DIPHENHYDRAMINE HCL 12.5 MG/5ML PO ELIX: 25.0000 mg | ORAL_SOLUTION | ORAL | Status: DC | PRN

## 2015-09-14 MED ORDER — ONDANSETRON HCL 4 MG/2ML IJ SOLN
INTRAMUSCULAR | Status: DC | PRN
  Administered 2015-09-14: 4 mg via INTRAVENOUS

## 2015-09-14 MED ORDER — BUPIVACAINE HCL (PF) 0.5 % IJ SOLN
INTRAMUSCULAR | Status: DC | PRN
  Administered 2015-09-14: 3 mL via INTRATHECAL

## 2015-09-14 MED ORDER — PROPOFOL 500 MG/50ML IV EMUL
INTRAVENOUS | Status: DC | PRN
  Administered 2015-09-14: 15 ug/kg/min via INTRAVENOUS

## 2015-09-14 MED ORDER — ONDANSETRON HCL 4 MG PO TABS: 4.0000 mg | ORAL_TABLET | Freq: Three times a day (TID) | ORAL | Status: DC | PRN

## 2015-09-14 MED ORDER — 0.9 % SODIUM CHLORIDE (POUR BTL) OPTIME
TOPICAL | Status: DC | PRN
  Administered 2015-09-14: 1000 mL
  Administered 2015-09-14: 500 mL

## 2015-09-14 MED ORDER — ALBUMIN HUMAN 5 % IV SOLN
INTRAVENOUS | Status: DC | PRN
  Administered 2015-09-14: 12:00:00 via INTRAVENOUS

## 2015-09-14 MED ORDER — METOCLOPRAMIDE HCL 5 MG PO TABS: 5.0000 mg | ORAL_TABLET | Freq: Three times a day (TID) | ORAL | Status: DC | PRN

## 2015-09-14 MED ORDER — METHOCARBAMOL 750 MG PO TABS: 750.0000 mg | ORAL_TABLET | Freq: Two times a day (BID) | ORAL | Status: DC | PRN

## 2015-09-14 MED ORDER — HYDROCODONE-ACETAMINOPHEN 5-325 MG PO TABS
ORAL_TABLET | ORAL | Status: AC
  Filled 2015-09-14: qty 1

## 2015-09-14 MED ORDER — METHOCARBAMOL 500 MG PO TABS
500.0000 mg | ORAL_TABLET | Freq: Four times a day (QID) | ORAL | Status: DC | PRN
  Administered 2015-09-15 – 2015-09-16 (×4): 500 mg via ORAL
  Filled 2015-09-14 (×4): qty 1

## 2015-09-14 MED ORDER — SENNOSIDES-DOCUSATE SODIUM 8.6-50 MG PO TABS: 1.0000 | ORAL_TABLET | Freq: Every evening | ORAL | Status: DC | PRN

## 2015-09-14 MED ORDER — MENTHOL 3 MG MT LOZG: 1.0000 | LOZENGE | OROMUCOSAL | Status: DC | PRN

## 2015-09-14 MED ORDER — METOCLOPRAMIDE HCL 5 MG/ML IJ SOLN: 5.0000 mg | Freq: Three times a day (TID) | INTRAMUSCULAR | Status: DC | PRN

## 2015-09-14 MED ORDER — HYDROCODONE-ACETAMINOPHEN 5-325 MG PO TABS
1.0000 | ORAL_TABLET | Freq: Once | ORAL | Status: AC
Start: 2015-09-14 — End: 2015-09-14
  Administered 2015-09-14: 1 via ORAL

## 2015-09-14 MED ORDER — HYDROCODONE-ACETAMINOPHEN 10-325 MG PO TABS: 1.0000 | ORAL_TABLET | ORAL | Status: DC | PRN

## 2015-09-14 MED ORDER — ZINC OXIDE 20 % EX OINT
TOPICAL_OINTMENT | Freq: Two times a day (BID) | CUTANEOUS | Status: DC | PRN
  Filled 2015-09-14: qty 28.35

## 2015-09-14 MED ORDER — ASPIRIN EC 325 MG PO TBEC: 325.0000 mg | DELAYED_RELEASE_TABLET | Freq: Two times a day (BID) | ORAL | Status: DC

## 2015-09-14 MED ORDER — FENTANYL CITRATE (PF) 250 MCG/5ML IJ SOLN
INTRAMUSCULAR | Status: AC
  Filled 2015-09-14: qty 5

## 2015-09-14 MED ORDER — MIDAZOLAM HCL 2 MG/2ML IJ SOLN
INTRAMUSCULAR | Status: DC | PRN
Start: 2015-09-14 — End: 2015-09-14
  Administered 2015-09-14: 2 mg via INTRAVENOUS

## 2015-09-14 MED ORDER — PHENOL 1.4 % MT LIQD: 1.0000 | OROMUCOSAL | Status: DC | PRN

## 2015-09-14 MED ORDER — ACETAMINOPHEN 500 MG PO TABS
1000.0000 mg | ORAL_TABLET | Freq: Four times a day (QID) | ORAL | Status: AC
  Administered 2015-09-14 – 2015-09-15 (×3): 1000 mg via ORAL
  Filled 2015-09-14 (×3): qty 2

## 2015-09-14 MED ORDER — ONDANSETRON HCL 4 MG PO TABS: 4.0000 mg | ORAL_TABLET | Freq: Four times a day (QID) | ORAL | Status: DC | PRN

## 2015-09-14 MED ORDER — LIDOCAINE HCL (CARDIAC) 20 MG/ML IV SOLN
INTRAVENOUS | Status: DC | PRN
  Administered 2015-09-14: 40 mg via INTRAVENOUS

## 2015-09-14 MED ORDER — HYDROMORPHONE HCL 1 MG/ML IJ SOLN
INTRAMUSCULAR | Status: AC
  Filled 2015-09-14: qty 1

## 2015-09-14 MED ORDER — PROMETHAZINE HCL 25 MG/ML IJ SOLN: 6.2500 mg | INTRAMUSCULAR | Status: DC | PRN

## 2015-09-14 MED ORDER — FENTANYL CITRATE (PF) 250 MCG/5ML IJ SOLN
INTRAMUSCULAR | Status: DC | PRN
  Administered 2015-09-14 (×2): 50 ug via INTRAVENOUS
  Administered 2015-09-14: 25 ug via INTRAVENOUS
  Administered 2015-09-14: 50 ug via INTRAVENOUS
  Administered 2015-09-14 (×3): 25 ug via INTRAVENOUS
  Administered 2015-09-14: 50 ug via INTRAVENOUS

## 2015-09-14 MED ORDER — CEFAZOLIN SODIUM-DEXTROSE 2-3 GM-% IV SOLR
2.0000 g | Freq: Four times a day (QID) | INTRAVENOUS | Status: AC
  Administered 2015-09-14: 2 g via INTRAVENOUS
  Filled 2015-09-14 (×4): qty 50

## 2015-09-14 MED ORDER — HYDROCODONE-ACETAMINOPHEN 10-325 MG PO TABS
1.0000 | ORAL_TABLET | ORAL | Status: DC | PRN
  Administered 2015-09-14 – 2015-09-17 (×11): 2 via ORAL
  Filled 2015-09-14 (×13): qty 2

## 2015-09-14 MED ORDER — PROPOFOL 10 MG/ML IV BOLUS
INTRAVENOUS | Status: DC | PRN
  Administered 2015-09-14: 30 mg via INTRAVENOUS
  Administered 2015-09-14: 150 mg via INTRAVENOUS

## 2015-09-14 SURGICAL SUPPLY — 50 items
BLADE SAW SGTL 18X1.27X75 (BLADE) ×2 IMPLANT
BNDG COHESIVE 6X5 TAN STRL LF (GAUZE/BANDAGES/DRESSINGS) ×2 IMPLANT
CAPT HIP TOTAL 2 ×1 IMPLANT
CELLS DAT CNTRL 66122 CELL SVR (MISCELLANEOUS) ×1 IMPLANT
COVER SURGICAL LIGHT HANDLE (MISCELLANEOUS) ×2 IMPLANT
DRAPE C-ARM 42X72 X-RAY (DRAPES) ×2 IMPLANT
DRAPE IMP U-DRAPE 54X76 (DRAPES) ×2 IMPLANT
DRAPE STERI IOBAN 125X83 (DRAPES) ×2 IMPLANT
DRAPE U-SHAPE 47X51 STRL (DRAPES) ×6 IMPLANT
DRSG AQUACEL AG ADV 3.5X10 (GAUZE/BANDAGES/DRESSINGS) ×3 IMPLANT
DRSG MEPILEX BORDER 4X8 (GAUZE/BANDAGES/DRESSINGS) IMPLANT
DURAPREP 26ML APPLICATOR (WOUND CARE) ×2 IMPLANT
ELECT BLADE 4.0 EZ CLEAN MEGAD (MISCELLANEOUS) ×2
ELECT REM PT RETURN 9FT ADLT (ELECTROSURGICAL) ×2
ELECTRODE BLDE 4.0 EZ CLN MEGD (MISCELLANEOUS) ×1 IMPLANT
ELECTRODE REM PT RTRN 9FT ADLT (ELECTROSURGICAL) ×1 IMPLANT
FACESHIELD WRAPAROUND (MASK) ×2 IMPLANT
FACESHIELD WRAPAROUND OR TEAM (MASK) ×1 IMPLANT
GLOVE NEODERM STRL 7.5 LF PF (GLOVE) ×2 IMPLANT
GLOVE SURG NEODERM 7.5  LF PF (GLOVE) ×2
GLOVE SURG SYN 7.5  E (GLOVE) ×1
GLOVE SURG SYN 7.5 E (GLOVE) ×1 IMPLANT
GLOVE SURG SYN 7.5 PF PI (GLOVE) ×1 IMPLANT
GOWN SRG XL XLNG 56XLVL 4 (GOWN DISPOSABLE) ×1 IMPLANT
GOWN STRL NON-REIN XL XLG LVL4 (GOWN DISPOSABLE) ×2
GOWN STRL REUS W/ TWL LRG LVL3 (GOWN DISPOSABLE) IMPLANT
GOWN STRL REUS W/TWL LRG LVL3 (GOWN DISPOSABLE)
HANDPIECE INTERPULSE COAX TIP (DISPOSABLE) ×2
KIT BASIN OR (CUSTOM PROCEDURE TRAY) ×2 IMPLANT
MARKER SKIN DUAL TIP RULER LAB (MISCELLANEOUS) ×2 IMPLANT
PACK TOTAL JOINT (CUSTOM PROCEDURE TRAY) ×2 IMPLANT
PACK UNIVERSAL I (CUSTOM PROCEDURE TRAY) ×2 IMPLANT
PADDING CAST COTTON 6X4 STRL (CAST SUPPLIES) ×2 IMPLANT
RETRACTOR WND ALEXIS 18 MED (MISCELLANEOUS) ×1 IMPLANT
RTRCTR WOUND ALEXIS 18CM MED (MISCELLANEOUS) ×2
SEALER BIPOLAR AQUA 6.0 (INSTRUMENTS) IMPLANT
SET HNDPC FAN SPRY TIP SCT (DISPOSABLE) ×1 IMPLANT
SOLUTION BETADINE 4OZ (MISCELLANEOUS) ×2 IMPLANT
SUT ETHIBOND 2 V 37 (SUTURE) ×3 IMPLANT
SUT ETHIBOND NAB CT1 #1 30IN (SUTURE) ×9 IMPLANT
SUT ETHILON 3 0 FSL (SUTURE) ×2 IMPLANT
SUT VIC AB 0 CT1 27 (SUTURE) ×4
SUT VIC AB 0 CT1 27XBRD ANBCTR (SUTURE) ×1 IMPLANT
SUT VIC AB 1 CT1 27 (SUTURE) ×4
SUT VIC AB 1 CT1 27XBRD ANBCTR (SUTURE) ×1 IMPLANT
SUT VIC AB 2-0 CT1 27 (SUTURE) ×8
SUT VIC AB 2-0 CT1 TAPERPNT 27 (SUTURE) ×2 IMPLANT
SYR 20CC LL (SYRINGE) ×2 IMPLANT
TOWEL OR 17X26 10 PK STRL BLUE (TOWEL DISPOSABLE) ×2 IMPLANT
TRAY FOLEY CATH 16FR SILVER (SET/KITS/TRAYS/PACK) ×2 IMPLANT

## 2015-09-14 NOTE — Anesthesia Postprocedure Evaluation (Signed)
Anesthesia Post Note  Patient: Stacy [name]  Procedure(s) Performed: Procedure(s) (LRB): RIGHT TOTAL HIP ARTHROPLASTY ANTERIOR APPROACH (Right)  Patient location during evaluation: PACU Anesthesia Type: General Level of consciousness: awake and alert Pain management: pain level controlled Vital Signs Assessment: post-procedure vital signs reviewed and stable Respiratory status: spontaneous breathing, nonlabored ventilation, respiratory function stable and patient connected to nasal cannula oxygen Cardiovascular status: blood pressure returned to baseline and stable Postop Assessment: no signs of nausea or vomiting and patient able to bend at knees Anesthetic complications: no Comments: Failed spinal. Converted to GA at start of procedure.    Last Vitals:  Filed Vitals:   09/14/15 1535 09/14/15 1545  BP: 129/72   Pulse: 72 76  Temp:    Resp: 21 18    Last Pain:  Filed Vitals:   09/14/15 1550  PainSc: 0-No pain                 Catalina Gravel

## 2015-09-14 NOTE — H&P (Signed)
    PREOPERATIVE H&P  Chief Complaint: Right hip osteoarthritis  HPI: Stacy [name] is a 54 y.o. female who presents for surgical treatment of Right hip osteoarthritis.  She denies any changes in medical history.  Past Medical History  Diagnosis Date  . Fibromyalgia   . Depression   . Heart murmur   . Arthritis   . Complication of anesthesia     DIFFICULTY WAKING UP , LAST SURGERY NECK 2012 WAS FINE PER PT HERE AT [facility]  . MVP (mitral valve prolapse)     AS INFANT  . Headache     HX MIGRAINES     Past Surgical History  Procedure Laterality Date  . Cardiac surgery      MVP 1970  . Neck surgery    . Rotator cuff repair Right   . Tubal ligation     Social History   Social History  . Marital Status: Married    Spouse Name: N/A  . Number of Children: N/A  . Years of Education: N/A   Social History Main Topics  . Smoking status: Current Every Day Smoker -- 0.25 packs/day    Types: Cigarettes  . Smokeless tobacco: Former Systems developer  . Alcohol Use: No  . Drug Use: No  . Sexual Activity: Yes    Birth Control/ Protection: Post-menopausal   Other Topics Concern  . Not on file   Social History Narrative   Family History  Problem Relation Age of Onset  . Adopted: Yes   Allergies  Allergen Reactions  . Naproxen Palpitations  . Erythromycin Nausea And Vomiting  . Tramadol Hcl Other (See Comments)    Headaches  . Gabapentin   . Percocet [Oxycodone-Acetaminophen]    Prior to Admission medications   Medication Sig Start Date End Date Taking? Authorizing Provider  ibuprofen (ADVIL,MOTRIN) 200 MG tablet Take 800 mg by mouth every 4 (four) hours as needed.    Yes Historical Provider, MD     Positive ROS: All other systems have been reviewed and were otherwise negative with the exception of those mentioned in the HPI and as above.  Physical Exam: General: Alert, no acute distress Cardiovascular: No pedal edema Respiratory: No cyanosis, no use of accessory musculature GI:  abdomen soft Skin: No lesions in the area of chief complaint Neurologic: Sensation intact distally Psychiatric: Patient is competent for consent with normal mood and affect Lymphatic: no lymphedema  MUSCULOSKELETAL: exam stable  Assessment: Right hip osteoarthritis  Plan: Plan for Procedure(s): RIGHT TOTAL HIP ARTHROPLASTY ANTERIOR APPROACH  The risks benefits and alternatives were discussed with the patient including but not limited to the risks of nonoperative treatment, versus surgical intervention including infection, bleeding, nerve injury,  blood clots, cardiopulmonary complications, morbidity, mortality, among others, and they were willing to proceed.   Marianna Payment, MD   09/14/2015 7:11 AM

## 2015-09-14 NOTE — Discharge Instructions (Signed)

## 2015-09-14 NOTE — Anesthesia Preprocedure Evaluation (Addendum)
Anesthesia Evaluation  Patient identified by MRN, date of birth, ID band Patient awake    Reviewed: Allergy & Precautions, H&P , NPO status , Patient's Chart, lab work & pertinent test results  History of Anesthesia Complications Negative for: history of anesthetic complications  Airway Mallampati: III  TM Distance: >3 FB Neck ROM: full    Dental no notable dental hx. (+) Teeth Intact, Dental Advisory Given   Pulmonary neg pulmonary ROS, Current Smoker,    Pulmonary exam normal breath sounds clear to auscultation       Cardiovascular hypertension, negative cardio ROS Normal cardiovascular exam+ Valvular Problems/Murmurs  Rhythm:regular Rate:Normal     Neuro/Psych  Headaches, PSYCHIATRIC DISORDERS Depression  Neuromuscular disease    GI/Hepatic negative GI ROS, Neg liver ROS,   Endo/Other  negative endocrine ROS  Renal/GU negative Renal ROS     Musculoskeletal  (+) Arthritis , Fibromyalgia -  Abdominal   Peds  Hematology negative hematology ROS (+)   Anesthesia Other Findings   Reproductive/Obstetrics negative OB ROS                            Anesthesia Physical Anesthesia Plan  ASA: III  Anesthesia Plan: Spinal   Post-op Pain Management:    Induction: Intravenous  Airway Management Planned: Simple Face Mask  Additional Equipment:   Intra-op Plan:   Post-operative Plan:   Informed Consent: I have reviewed the patients History and Physical, chart, labs and discussed the procedure including the risks, benefits and alternatives for the proposed anesthesia with the patient or authorized representative who has indicated his/her understanding and acceptance.   Dental Advisory Given  Plan Discussed with: Anesthesiologist and CRNA  Anesthesia Plan Comments:         Anesthesia Quick Evaluation

## 2015-09-14 NOTE — Op Note (Addendum)
RIGHT TOTAL HIP ARTHROPLASTY ANTERIOR APPROACH  Procedure Note [facility]   [id number]  Pre-op Diagnosis: Right hip osteoarthritis     Post-op Diagnosis: same   Operative Procedures  1. Total hip replacement; Right hip; uncemented [id number]   Personnel  Surgeon(s): Leandrew Koyanagi, MD   Anesthesia: spinal, general  Prosthesis: Depuy Acetabulum: Pinnacle 52 mm Femur: Corail KLA 10 Head: 36 mm size: +1.5 Bearing Type: Ceramic on poly  Date of Service: 09/14/2015  Total Hip Arthroplasty (Anterior Approach) Op Note:  After informed consent was obtained and the operative extremity marked in the holding area, the patient was brought back to the operating room and placed supine on the HANA table. Next, the operative extremity was prepped and draped in normal sterile fashion. Surgical timeout occurred verifying patient identification, surgical site, surgical procedure and administration of antibiotics.  A modified anterior Smith-Peterson approach to the hip was performed, using the interval between tensor fascia lata and sartorius.  Dissection was carried bluntly down onto the anterior hip capsule. The lateral femoral circumflex vessels were identified and coagulated. A capsulotomy was performed and the capsular flaps tagged for later repair.  Fluoroscopy was utilized to prepare for the femoral neck cut. The neck osteotomy was performed. The femoral head was removed, the acetabular rim was cleared of soft tissue and attention was turned to reaming the acetabulum.  Sequential reaming was performed under fluoroscopic guidance. We reamed to a size 51 mm, and then impacted the acetabular shell. The liner was then placed after irrigation and attention turned to the femur.  After placing the femoral hook, the leg was taken to externally rotated, extended and adducted position taking care to perform soft tissue releases to allow for adequate mobilization of the femur. Soft tissue was cleared from the  shoulder of the greater trochanter and the hook elevator used to improve exposure of the proximal femur. Sequential broaching performed up to a size 10. Trial neck and head were placed. The leg was brought back up to neutral and the construct reduced. The position and sizing of components, offset and leg lengths were checked using fluoroscopy. Stability of the  construct was checked in extension and external rotation without any subluxation or impingement of prosthesis. We dislocated the prosthesis, dropped the leg back into position, removed trial components, and irrigated copiously. The final stem and head was then placed, the leg brought back up, the system reduced and fluoroscopy used to verify positioning.  We irrigated, obtained hemostasis and closed the capsule using #2 ethibond suture.  Dilute betadyne solution was used. The fascia was closed with #1 vicryl plus, the deep fat layer was closed with 0 vicryl, the subcutaneous layers closed with 2.0 Vicryl Plus and the skin closed with 4.0 monocryl and dermabond. A sterile dressing was applied. The patient was awakened in the operating room and taken to recovery in stable condition.  All sponge, needle, and instrument counts were correct at the end of the case.   Position: supine  Complications: none.  Time Out: performed   Drains/Packing: none  Estimated blood loss: 200 cc  Returned to Recovery Room: in good condition.   Antibiotics: yes   Mechanical VTE (DVT) Prophylaxis: sequential compression devices, TED thigh-high  Chemical VTE (DVT) Prophylaxis: aspirin   Fluid Replacement: see anesthesia record  Specimens Removed: 1 to pathology   Sponge and Instrument Count Correct? yes   PACU: portable radiograph - low AP   Admission: inpatient status, start PT & OT POD#1  Plan/RTC: Return in 2 weeks for staple removal. Return in 6 weeks to see MD.  Weight Bearing/Load Lower Extremity: full  Hip precautions: none Suture Removal: 10-14  days  Betadine to incision twice daily once dressing is removed on POD#7  N. Eduard Roux, MD [place] [phone] 1:17 PM      Implant Name Type Inv. Item Serial No. Manufacturer Lot No. LRB No. Used  PIN SECTOR W/GRIP ACE CUP 52MM - [id number] Hips PIN SECTOR W/GRIP ACE CUP 52MM  DEPUY [id number] Right 1  APEX HOLE ELIM DEPUY - [id number] Hips APEX HOLE ELIM DEPUY  DEPUY [id number] Right 1  LINER ACETAB NEUTRAL 36ID 520D - [id number] Liner LINER ACETAB NEUTRAL 36ID 520D  DEPUY [id number] Right 1  HEAD CERAMIC DELTA 36 PLUS 1.5 - [id number] Hips HEAD CERAMIC DELTA 36 PLUS 1.5  DEPUY [id number] Right 1  STEM CORAIL KLA10 - [id number] Stem STEM CORAIL KLA10   DEPUY [id number] Right 1

## 2015-09-14 NOTE — Progress Notes (Signed)
Utilization review complete. Tami Barren RN CCM Case Mgmt phone [phone] 

## 2015-09-14 NOTE — Transfer of Care (Signed)
Immediate Anesthesia Transfer of Care Note  Patient: Stacy [name]  Procedure(s) Performed: Procedure(s): RIGHT TOTAL HIP ARTHROPLASTY ANTERIOR APPROACH (Right)  Patient Location: PACU  Anesthesia Type:General  Level of Consciousness: awake, alert , oriented and patient cooperative  Airway & Oxygen Therapy: Patient Spontanous Breathing and Patient connected to nasal cannula oxygen  Post-op Assessment: Report given to RN and Post -op Vital signs reviewed and stable  Post vital signs: Reviewed and stable  Last Vitals:  Filed Vitals:   09/14/15 0910 09/14/15 0923  BP:  130/71  Pulse: 89   Temp: 37.1 C   Resp: 18     Complications: No apparent anesthesia complications

## 2015-09-14 NOTE — Anesthesia Procedure Notes (Addendum)
Spinal Patient location during procedure: OR Staffing Anesthesiologist: JUDD, BENJAMIN Performed by: anesthesiologist  Preanesthetic Checklist Completed: patient identified, site marked, surgical consent, pre-op evaluation, timeout performed, IV checked, risks and benefits discussed and monitors and equipment checked Spinal Block Patient position: sitting Prep: DuraPrep Patient monitoring: heart rate, continuous pulse ox and blood pressure Approach: midline Location: L4-5 Injection technique: single-shot Needle Needle type: Quincke  Needle gauge: 22 G Needle length: 9 cm Additional Notes Functioning IV was confirmed and monitors were applied. Sterile prep and drape, including hand hygiene, mask and sterile gloves were used. The patient was positioned and the spine was prepped. The skin was anesthetized with lidocaine.  Free flow of clear CSF was obtained prior to injecting local anesthetic into the CSF.  The spinal needle aspirated freely following injection.  The needle was carefully withdrawn.  The patient tolerated the procedure well. Consent was obtained prior to procedure with all questions answered and concerns addressed.  Stacy Pink, MD    Procedure Name: LMA Insertion Date/Time: 09/14/2015 11:35 AM Performed by: Merdis Delay Pre-anesthesia Checklist: Patient identified, Timeout performed, Emergency Drugs available, Suction available and Patient being monitored Patient Re-evaluated:Patient Re-evaluated prior to inductionOxygen Delivery Method: Circle system utilized Preoxygenation: Pre-oxygenation with 100% oxygen Intubation Type: IV induction Ventilation: Mask ventilation without difficulty LMA: LMA inserted LMA Size: 4.0 Tube type: Oral Number of attempts: 1 Placement Confirmation: breath sounds checked- equal and bilateral and positive ETCO2 ETT to lip (cm): 20 cc air. Tube secured with: Tape Dental Injury: Teeth and Oropharynx as per pre-operative assessment   Comments: Poor pain control  with spinal--- GA LMA as per Stacy [name]- IV induction Judd--- LMA insertion AM /CRNA - atraumatic--- teeth and mouth as preop-  Bilat BS Stacy [name]

## 2015-09-15 ENCOUNTER — Inpatient Hospital Stay (HOSPITAL_COMMUNITY): Payer: BLUE CROSS/BLUE SHIELD

## 2015-09-15 ENCOUNTER — Encounter (HOSPITAL_COMMUNITY): Payer: Self-pay | Admitting: Orthopaedic Surgery

## 2015-09-15 LAB — BASIC METABOLIC PANEL
ANION GAP: 7 (ref 5–15)
BUN: 6 mg/dL (ref 6–20)
CALCIUM: 8.4 mg/dL — AB (ref 8.9–10.3)
CHLORIDE: 106 mmol/L (ref 101–111)
CO2: 25 mmol/L (ref 22–32)
Creatinine, Ser: 0.84 mg/dL (ref 0.44–1.00)
GFR calc Af Amer: >60 mL/min (ref >60)
GFR calc non Af Amer: >60 mL/min (ref >60)
GLUCOSE: 105 mg/dL — AB (ref 65–99)
POTASSIUM: 3.5 mmol/L (ref 3.5–5.1)
Sodium: 138 mmol/L (ref 135–145)

## 2015-09-15 LAB — CBC
HCT: 34.4 % — ABNORMAL LOW (ref 36.0–46.0)
Hemoglobin: 11.3 g/dL — ABNORMAL LOW (ref 12.0–15.0)
MCH: 29.5 pg (ref 26.0–34.0)
MCHC: 32.8 g/dL (ref 30.0–36.0)
MCV: 89.8 fL (ref 78.0–100.0)
Platelets: 235 10*3/uL (ref 150–400)
RBC: 3.83 MIL/uL — ABNORMAL LOW (ref 3.87–5.11)
RDW: 13.1 % (ref 11.5–15.5)
WBC: 11.6 10*3/uL — AB (ref 4.0–10.5)

## 2015-09-15 NOTE — Evaluation (Signed)
Physical Therapy Evaluation Patient Details Name: Stacy [name] MRN: [id number] DOB: 27-Oct-1960 Today's Date: 09/15/2015   History of Present Illness  Admitted for RTHA;  has a past medical history of Fibromyalgia; Depression; Heart murmur; Arthritis; Complication of anesthesia; MVP (mitral valve prolapse); and Headache.  has past surgical history that includes Cardiac surgery; Neck surgery; Rotator cuff repair (Right)  Clinical Impression   Pt is s/p THA resulting in the deficits listed below (see PT Problem List). Pt will benefit from skilled PT to increase their independence and safety with mobility to allow discharge to the venue listed below.      Follow Up Recommendations Home health PT;Supervision - Intermittent    Equipment Recommendations  Rolling walker with 5" wheels;3in1 (PT)    Recommendations for Other Services OT consult     Precautions / Restrictions Precautions Precautions: Fall Restrictions RLE Weight Bearing: Weight bearing as tolerated      Mobility  Bed Mobility Overal bed mobility: Needs Assistance;+2 for physical assistance Bed Mobility: Supine to Sit     Supine to sit: +2 for physical assistance;Max assist     General bed mobility comments: Step-by-step cues for technique; able to use LLE to scoot hips to EOB minimally; significantly increased pain once getting to sit was initiated; needing 2 person assist to elevate trunk, and square off hips at EOB  Transfers Overall transfer level: Needs assistance Equipment used: Rolling walker (2 wheeled) Transfers: Sit to/from Stand Sit to Stand: Mod assist         General transfer comment: Cues for hand placement and technique; Mod assist to power up initially from bed; still needing assist, though less, with use of armrest to push up from 3in1; cues to control descent to sit  Ambulation/Gait Ambulation/Gait assistance: +2 safety/equipment;Min assist Ambulation Distance (Feet): 6 Feet Assistive  device: Rolling walker (2 wheeled) Gait Pattern/deviations: Step-to pattern     General Gait Details: Cues for posture, gait sequence and technique; very close guard for safety and chair behind as she kept her eyes closed for a large part of the walk  Stairs            Wheelchair Mobility    Modified Rankin (Stroke Patients Only)       Balance                                             Pertinent Vitals/Pain Pain Assessment: 0-10 Pain Score: 10-Worst pain ever Pain Location: R hip with bed mobility; decr to 8/10 by the end of session Pain Descriptors / Indicators: Aching;Grimacing;Guarding Pain Intervention(s): Limited activity within patient's tolerance;Monitored during session;Premedicated before session;Repositioned    Home Living Family/patient expects to be discharged to:: Private residence Living Arrangements: Children;Parent (pt mentioned that her mother and her son can assist her) Available Help at Discharge: Family;Available PRN/intermittently Type of Home: House Home Access: Stairs to enter Entrance Stairs-Rails: Psychiatric nurse of Steps: 4 Home Layout: One level Home Equipment:  (to be determined)      Prior Function Level of Independence: Independent               Hand Dominance        Extremity/Trunk Assessment   Upper Extremity Assessment: Defer to OT evaluation           Lower Extremity Assessment: RLE deficits/detail RLE Deficits / Details: Decr AROM and  strentgth R hip, limited by pain postop       Communication   Communication: No difficulties  Cognition Arousal/Alertness: Awake/alert (Though eyes closed approx 50% of session) Behavior During Therapy: WFL for tasks assessed/performed;Restless (slightly) Overall Cognitive Status: Within Functional Limits for tasks assessed (though easily distractable by pain)                      General Comments General comments (skin  integrity, edema, etc.): Session conducted on Room Air; O2 sats initially 96%; post walk pt's sats dropped to 81-89%, her HR increased to 180, observed highest; Re-started O2 and RN called to room; this lasted approx 2-3 minutes, then HR eased back down to 100, O2 sats 96%, BP stable;  This could be a response to her pain and/or lower abdominal cramping; RN aware    Exercises Total Joint Exercises Ankle Circles/Pumps: AROM;Both;10 reps Quad Sets: AROM;Right;10 reps Heel Slides: AAROM;Right;10 reps Hip ABduction/ADduction: AAROM;Right;5 reps      Assessment/Plan    PT Assessment Patient needs continued PT services  PT Diagnosis Difficulty walking;Acute pain   PT Problem List Decreased strength;Decreased range of motion;Decreased activity tolerance;Decreased balance;Decreased mobility;Decreased coordination;Decreased knowledge of use of DME;Decreased safety awareness;Decreased knowledge of precautions;Pain  PT Treatment Interventions DME instruction;Gait training;Stair training;Functional mobility training;Therapeutic activities;Therapeutic exercise;Patient/family education   PT Goals (Current goals can be found in the Care Plan section) Acute Rehab PT Goals Patient Stated Goal: Did not state PT Goal Formulation: With patient Time For Goal Achievement: 09/29/15 Potential to Achieve Goals: Good    Frequency 7X/week   Barriers to discharge Decreased caregiver support Would like ot verify exactly how much assist is available to pt    Co-evaluation PT/OT/SLP Co-Evaluation/Treatment: Yes Reason for Co-Treatment: For patient/therapist safety (Pt also quite anxious) PT goals addressed during session: Mobility/safety with mobility         End of Session Equipment Utilized During Treatment: Gait belt Activity Tolerance: Patient limited by pain Patient left: in chair;with call bell/phone within reach;with chair alarm set Nurse Communication: Mobility status (Vitals response to  activity)         Time: [id number] PT Time Calculation (min) (ACUTE ONLY): 33 min   Charges:   PT Evaluation $Initial PT Evaluation Tier I: 1 Procedure     PT G CodesRoney Marion Hamff 09/15/2015, 11:03 AM  Roney Marion, [place] Pager [phone] Office [phone]

## 2015-09-15 NOTE — Progress Notes (Signed)
Physical Therapy Treatment Patient Details Name: Stacy [name] MRN: [id number] DOB: [birth year] Today's Date: 09/15/2015    History of Present Illness Admitted for RTHA;  has a past medical history of Fibromyalgia; Depression; Heart murmur; Arthritis; Complication of anesthesia; MVP (mitral valve prolapse); and Headache.  has past surgical history that includes Cardiac surgery; Neck surgery; Rotator cuff repair (Right)    PT Comments    Making slow progress with mobility, but did perform sit<>stand transfers twice this session with mod assist; Would like to know more specifics about home assist (both sessions pt was in a lot of pain and not very forthcoming with home assist info)    Follow Up Recommendations  Home health PT;Supervision - Intermittent     Equipment Recommendations  Rolling walker with 5" wheels;3in1 (PT)    Recommendations for Other Services OT consult     Precautions / Restrictions Precautions Precautions: Fall Restrictions Weight Bearing Restrictions: Yes RLE Weight Bearing: Weight bearing as tolerated    Mobility  Bed Mobility Overal bed mobility: Needs Assistance Bed Mobility: Sit to Supine     Supine to sit: +2 for physical assistance;Max assist Sit to supine: Mod assist   General bed mobility comments: Cues for technique, mod assist to help RLE onto bed; very painful, and at one point, Stacy [name] stopped and sat back up due to pain  Transfers Overall transfer level: Needs assistance Equipment used: Rolling walker (2 wheeled) Transfers: Sit to/from Stand Sit to Stand: Mod assist Stand pivot transfers: Min assist       General transfer comment: Cues for hand placement and technique; Mod assist to power up; cues to control descent to sit  Ambulation/Gait Ambulation/Gait assistance: Min assist Ambulation Distance (Feet):  (small sidesteps recliner to BSC to bed with RW) Assistive device: Rolling walker (2 wheeled) Gait Pattern/deviations: Step-to  pattern     General Gait Details: Cues for posture, gait sequence and technique   Stairs            Wheelchair Mobility    Modified Rankin (Stroke Patients Only)       Balance Overall balance assessment: Needs assistance Sitting-balance support: Feet supported;Single extremity supported Sitting balance-Leahy Scale: Fair     Standing balance support: Bilateral upper extremity supported;During functional activity Standing balance-Leahy Scale: Poor Standing balance comment: RW for support                    Cognition Arousal/Alertness: Awake/alert (Though eyes closed approx 50% of session) Behavior During Therapy: WFL for tasks assessed/performed;Anxious (anxiety with anticipation of pain) Overall Cognitive Status: Within Functional Limits for tasks assessed (though easily distractable by pain)                      Exercises      General Comments General comments (skin integrity, edema, etc.): Session conducted on Room Air; O2 sats initially 96%; post walk pt's sats dropped to 81-89%, her HR increased to 180, observed highest; Re-started O2 and RN called to room; this lasted approx 2-3 minutes, then HR eased back down to 100, O2 sats 96%, BP stable;  This could be a response to her pain and/or lower abdominal cramping; RN aware      Pertinent Vitals/Pain Pain Assessment: 0-10 Pain Score: 10-Worst pain ever Pain Location: R hip; crying Pain Descriptors / Indicators: Aching;Grimacing;Guarding Pain Intervention(s): Limited activity within patient's tolerance;Monitored during session;Repositioned    Home Living Family/patient expects to be discharged to:: Private residence Living Arrangements:  Children;Parent (pt mentioned that her mother and her son can assist her) Available Help at Discharge: Family;Available PRN/intermittently Type of Home: House Home Access: Stairs to enter Entrance Stairs-Rails: Right;Left Home Layout: One level Home Equipment:  None      Prior Function Level of Independence: Independent          PT Goals (current goals can now be found in the care plan section) Acute Rehab PT Goals Patient Stated Goal: Did not state PT Goal Formulation: With patient Time For Goal Achievement: 09/29/15 Potential to Achieve Goals: Good Progress towards PT goals: Progressing toward goals (slowly)    Frequency  7X/week    PT Plan Current plan remains appropriate    Co-evaluation   Reason for Co-Treatment: For patient/therapist safety   OT goals addressed during session: ADL's and self-care     End of Session Equipment Utilized During Treatment: Gait belt Activity Tolerance: Patient limited by pain Patient left: in bed;with call bell/phone within reach     Time: 1500-1524 PT Time Calculation (min) (ACUTE ONLY): 24 min  Charges:  $Gait Training: 8-22 mins $Therapeutic Activity: 8-22 mins                    G Codes:      Quin Hoop 09/15/2015, 3:35 PM  Roney Marion, [place] Pager [phone] Office [phone]

## 2015-09-15 NOTE — Progress Notes (Signed)
  Echocardiogram 2D Echocardiogram has been performed.  Darlina Sicilian M 09/15/2015, 3:17 PM

## 2015-09-15 NOTE — Evaluation (Signed)
Occupational Therapy Evaluation Patient Details Name: Stacy [name] MRN: [id number] DOB: 1961-06-19 Today's Date: 09/15/2015    History of Present Illness Admitted for RTHA;  has a past medical history of Fibromyalgia; Depression; Heart murmur; Arthritis; Complication of anesthesia; MVP (mitral valve prolapse); and Headache.  has past surgical history that includes Cardiac surgery; Neck surgery; Rotator cuff repair (Right)   Clinical Impression   Pt reports she was independent with ADLs PTA. Currently pt is max assist +2 for bed mobility, mod-min assist for stand pivot transfer to [facility] and mod assist for LB ADLs. Pt planning to d/c home with intermittent supervision from family/friends. Pt would benefit from continued skilled OT to maximize independence and safety with LB ADLs, toilet and walk in shower transfers.     Follow Up Recommendations  Home health OT;Supervision/Assistance - 24 hour    Equipment Recommendations  3 in 1 bedside comode    Recommendations for Other Services       Precautions / Restrictions Precautions Precautions: Fall Restrictions Weight Bearing Restrictions: Yes RLE Weight Bearing: Weight bearing as tolerated      Mobility Bed Mobility Overal bed mobility: Needs Assistance;+2 for physical assistance Bed Mobility: Supine to Sit     Supine to sit: +2 for physical assistance;Max assist     General bed mobility comments: Step-by-step cues for technique; able to use LLE to scoot hips to EOB minimally; significantly increased pain once getting to sit was initiated; needing 2 person assist to elevate trunk, and square off hips at EOB  Transfers Overall transfer level: Needs assistance Equipment used: Rolling walker (2 wheeled) Transfers: Sit to/from [facility] Sit to Stand: Mod assist Stand pivot transfers: Min assist       General transfer comment: Cues for hand placement and technique; Mod assist to power up initially from bed;  still needing assist, though less, with use of armrest to push up from 3in1; cues to control descent to sit    Balance Overall balance assessment: Needs assistance Sitting-balance support: Feet supported;Single extremity supported Sitting balance-Leahy Scale: Fair     Standing balance support: Bilateral upper extremity supported;During functional activity Standing balance-Leahy Scale: Poor Standing balance comment: RW for support                            ADL Overall ADL's : Needs assistance/impaired Eating/Feeding: Set up;Sitting   Grooming: Set up;Sitting       Lower Body Bathing: Moderate assistance;Sit to/from stand       Lower Body Dressing: Moderate assistance;Sit to/from stand   Toilet Transfer: Moderate assistance;Stand-pivot;BSC;RW Toilet Transfer Details (indicate cue type and reason): Mod assist to boost up, min assist for stand pivot to [facility] Toileting- Clothing Manipulation and Hygiene: Total assistance;Sit to/from stand       Functional mobility during ADLs: Minimal assistance;Rolling walker General ADL Comments: No family present for OT session. Eval limited by pain.      Vision     Perception     Praxis      Pertinent Vitals/Pain Pain Assessment: 0-10 Pain Score: 10-Worst pain ever Pain Location: R hip with bed mobility, decreased to 8/10 by end of session Pain Descriptors / Indicators: Aching;Grimacing;Guarding;Crying Pain Intervention(s): Limited activity within patient's tolerance;Monitored during session;Repositioned;Premedicated before session     Hand Dominance     Extremity/Trunk Assessment Upper Extremity Assessment Upper Extremity Assessment: Overall WFL for tasks assessed   Lower Extremity Assessment Lower Extremity Assessment: Defer to  PT evaluation RLE Deficits / Details: Decr AROM and strentgth R hip, limited by pain postop RLE: Unable to fully assess due to pain   Cervical / Trunk Assessment Cervical / Trunk  Assessment: Normal   Communication Communication Communication: No difficulties   Cognition Arousal/Alertness: Awake/alert (Though eyes closed approx 50% of session) Behavior During Therapy: WFL for tasks assessed/performed;Restless (slightly) Overall Cognitive Status: Within Functional Limits for tasks assessed (though easily distractable by pain)                     General Comments       Exercises Exercises: Total Joint     Shoulder Instructions      Home Living Family/patient expects to be discharged to:: Private residence Living Arrangements: Children;Parent (pt mentioned that her mother and her son can assist her) Available Help at Discharge: Family;Available PRN/intermittently Type of Home: House Home Access: Stairs to enter [employer] of Steps: 4 Entrance Stairs-Rails: Right;Left Home Layout: One level     Bathroom Shower/Tub: Occupational psychologist: Standard     Home Equipment: None          Prior Functioning/Environment Level of Independence: Independent             OT Diagnosis: Generalized weakness;Acute pain   OT Problem List: Decreased activity tolerance;Impaired balance (sitting and/or standing);Decreased safety awareness;Decreased knowledge of use of DME or AE;Decreased knowledge of precautions;Pain   OT Treatment/Interventions: Self-care/ADL training;DME and/or AE instruction;Patient/family education    OT Goals(Current goals can be found in the care plan section) Acute Rehab OT Goals Patient Stated Goal: Did not state OT Goal Formulation: With patient Time For Goal Achievement: 09/29/15 Potential to Achieve Goals: Good ADL Goals Pt Will Perform Grooming: with supervision;standing Pt Will Perform Lower Body Bathing: with supervision;sit to/from stand (with or without AE) Pt Will Perform Lower Body Dressing: with supervision;sit to/from stand (with or without AE) Pt Will Transfer to Toilet: with  supervision;ambulating;bedside commode (over toilet) Pt Will Perform Toileting - Clothing Manipulation and hygiene: with supervision;sit to/from stand Pt Will Perform Tub/Shower Transfer: Shower transfer;with supervision;ambulating;3 in 1;rolling walker  OT Frequency: Min 2X/week   Barriers to D/C: Decreased caregiver support          Co-evaluation PT/OT/SLP Co-Evaluation/Treatment: Yes Reason for Co-Treatment: For patient/therapist safety PT goals addressed during session: Mobility/safety with mobility OT goals addressed during session: ADL's and self-care      End of Session Equipment Utilized During Treatment: Gait belt;Rolling walker Nurse Communication: Mobility status;Weight bearing status;Other (comment) (HR elevated)  Activity Tolerance: Patient limited by pain Patient left: in chair;with call bell/phone within reach   Time: [id number] OT Time Calculation (min): 33 min Charges:  OT General Charges $OT Visit: 1 Procedure OT Evaluation $Initial OT Evaluation Tier I: 1 Procedure G-Codes:     Binnie Kand M.S., OTR/L Pager: [phone]  09/15/2015, 1:13 PM

## 2015-09-15 NOTE — Progress Notes (Signed)
   Subjective:  Patient reports pain as moderate.    Objective:   VITALS:   Filed Vitals:   09/14/15 1718 09/14/15 1956 09/15/15 0005 09/15/15 0430  BP: 119/78 175/81 139/74 119/60  Pulse: 85 80 84 85  Temp: 98.4 F (36.9 C) 98.2 F (36.8 C) 97.9 F (36.6 C) 98.2 F (36.8 C)  TempSrc:  Oral Oral Oral  Resp: 16 18 20 18   Height:      Weight:      SpO2: 100% 96% 96% 93%    Neurologically intact Neurovascular intact Sensation intact distally Intact pulses distally Dorsiflexion/Plantar flexion intact Incision: dressing C/D/I and no drainage No cellulitis present Compartment soft   Lab Results  Component Value Date   WBC 11.6* 09/15/2015   HGB 11.3* 09/15/2015   HCT 34.4* 09/15/2015   MCV 89.8 09/15/2015   PLT 235 09/15/2015     Assessment/Plan:  1 Day Post-Op   - Expected postop acute blood loss anemia - will monitor for symptoms - Up with PT/OT - DVT ppx - SCDs, ambulation, asa - WBAT operative extremity - Pain control - Discharge planning - message has been sent to Dr. Wynonia Lawman who requested an echo while in house  Marianna Payment 09/15/2015, 8:03 AM [phone]

## 2015-09-16 LAB — CBC
HEMATOCRIT: 34.5 % — AB (ref 36.0–46.0)
HEMOGLOBIN: 11.4 g/dL — AB (ref 12.0–15.0)
MCH: 29.7 pg (ref 26.0–34.0)
MCHC: 33 g/dL (ref 30.0–36.0)
MCV: 89.8 fL (ref 78.0–100.0)
Platelets: 227 10*3/uL (ref 150–400)
RBC: 3.84 MIL/uL — ABNORMAL LOW (ref 3.87–5.11)
RDW: 13.2 % (ref 11.5–15.5)
WBC: 13.3 10*3/uL — AB (ref 4.0–10.5)

## 2015-09-16 NOTE — Care Management Note (Signed)
Case Management Note  Patient Details  Name: Stacy [name] MRN: [id number] Date of Birth: [birth year]  Subjective/Objective:            S/p right total hip arthroplasty        Action/Plan: Spoke with patient about discharge, she will be staying with family at [place]. She selected [facility] for [facility]. Contacted Tae Robak at [facility] and set up [place] and [place]. Contacted Jermaine at [facility] and requested rolling walker and 3N1 be delivered to patient's room.   Expected Discharge Date:                  Expected Discharge Plan:  [place]  In-House Referral:  NA  Discharge planning Services  CM Consult  Post Acute Care Choice:  Durable Medical Equipment, Home Health Choice offered to:  Patient  DME Arranged:  3-N-1, Walker rolling DME Agency:  [place]:  PT, OT [facility] Agency:  [facility]  Status of Service:  Completed, signed off  Medicare Important Message Given:    Date Medicare IM Given:    Medicare IM give by:    Date Additional Medicare IM Given:    Additional Medicare Important Message give by:     If discussed at [place] of Stay Meetings, dates discussed:    Additional Comments:  Nila Nephew, RN 09/16/2015, 8:55 AM

## 2015-09-16 NOTE — Progress Notes (Signed)
Physical Therapy Treatment Patient Details Name: Stacy [name] MRN: [id number] DOB: 04/01/1961 Today's Date: 09/16/2015    History of Present Illness Admitted for RTHA;  has a past medical history of Fibromyalgia; Depression; Heart murmur; Arthritis; Complication of anesthesia; MVP (mitral valve prolapse); and Headache.  has past surgical history that includes Cardiac surgery; Neck surgery; Rotator cuff repair (Right)    PT Comments    Patient continues to progress slowly with PT and demonstrated most difficulty with R hip flexion for safe clearance of R foot when ambulating but with improved activity tolerance. Min A for stair training and min guard for transfers. Pt needs to be led through entire HEP and review handout next session. Continue to progress as tolerated.   Follow Up Recommendations  Home health PT;Supervision - Intermittent     Equipment Recommendations  Rolling [name] with 5" wheels;3in1 (PT)    Recommendations for Other Services OT consult     Precautions / Restrictions Precautions Precautions: Fall Restrictions Weight Bearing Restrictions: Yes RLE Weight Bearing: Weight bearing as tolerated    Mobility  Bed Mobility               General bed mobility comments: OOB in chair upon arrival  Transfers Overall transfer level: Needs assistance Equipment used: Rolling [name] (2 wheeled) Transfers: Sit to/from Stand Sit to Stand: Min guard         General transfer comment: extra time needed to achieve upright posture first trial but improved second trial both from recliner; carry over of hand placement and technique  Ambulation/Gait Ambulation/Gait assistance: Min guard Ambulation Distance (Feet): 100 Feet Assistive device: Rolling [name] (2 wheeled) Gait Pattern/deviations: Step-to pattern     General Gait Details: pt with tendency to use R forefoot when stepping; encouraged pt to increase WS to R LE and for facilitation of heel strike with  therapist demonstrating; pt with ability to correct with min cues; pt continues to be very guarded with heavy use of bilat UE for support   Stairs Stairs: Yes Stairs assistance: Min assist Stair Management: Two rails;Forwards Number of Stairs: 2 (X 2) General stair comments: pt has difficulty with R hip flexion and ability to clear R foot for safe ambulation without min A for WS and maintaining balance; pt demonstrated slight improvement second trial but fatigued  Wheelchair Mobility    Modified Rankin (Stroke Patients Only)       Balance Overall balance assessment: Needs assistance Sitting-balance support: Bilateral upper extremity supported Sitting balance-Leahy Scale: Fair     Standing balance support: No upper extremity supported Standing balance-Leahy Scale: Fair Standing balance comment:  (static standing )                    Cognition Arousal/Alertness: Awake/alert Behavior During Therapy: WFL for tasks assessed/performed;Restless Overall Cognitive Status: Within Functional Limits for tasks assessed                      Exercises Total Joint Exercises Heel Slides: AAROM;Right;15 reps;Seated    General Comments        Pertinent Vitals/Pain Pain Assessment: Faces Pain Score: 7  Faces Pain Scale: Hurts little more Pain Location: R hip Pain Descriptors / Indicators: Sore;Burning Pain Intervention(s): Limited activity within patient's tolerance;Monitored during session;Premedicated before session;Ice applied    Home Living                      Prior Function  PT Goals (current goals can now be found in the care plan section) Acute Rehab PT Goals Patient Stated Goal: Did not state PT Goal Formulation: With patient Time For Goal Achievement: 09/29/15 Potential to Achieve Goals: Good Progress towards PT goals: Progressing toward goals    Frequency  7X/week    PT Plan Current plan remains appropriate     Co-evaluation PT/OT/SLP Co-Evaluation/Treatment: Yes Reason for Co-Treatment: For patient/therapist safety   OT goals addressed during session: ADL's and self-care     End of Session Equipment Utilized During Treatment: Gait belt Activity Tolerance: Patient limited by pain Patient left: in chair;with call bell/phone within reach;with chair alarm set     Time: 1400-1426 PT Time Calculation (min) (ACUTE ONLY): 26 min  Charges:  $Gait Training: 23-37 mins                    G Codes:      [place] April, PTA Pager: [phone]   09/16/2015, 2:46 PM

## 2015-09-16 NOTE — Progress Notes (Signed)
   Subjective:  Patient reports pain as improved.  Objective:   VITALS:   Filed Vitals:   09/15/15 0430 09/15/15 1343 09/15/15 2037 09/16/15 0550  BP: 119/60 152/71 127/83 121/82  Pulse: 85 84 85 77  Temp: 98.2 F (36.8 C) 98.8 F (37.1 C) 99.7 F (37.6 C) 98.8 F (37.1 C)  TempSrc: Oral  Oral Oral  Resp: 18 18 16 16   Height:      Weight:      SpO2: 93% 96% 99% 100%    Neurologically intact Neurovascular intact Sensation intact distally Intact pulses distally Dorsiflexion/Plantar flexion intact Incision: dressing C/D/I and no drainage No cellulitis present Compartment soft   Lab Results  Component Value Date   WBC 11.6* 09/15/2015   HGB 11.3* 09/15/2015   HCT 34.4* 09/15/2015   MCV 89.8 09/15/2015   PLT 235 09/15/2015     Assessment/Plan:  2 Days Post-Op   - Expected postop acute blood loss anemia - will monitor for symptoms - Up with PT/OT - DVT ppx - SCDs, ambulation, asa - WBAT operative extremity - Pain controlled - may dc home today once she clears PT and has DME - Rx in chart  Stacy [name] 09/16/2015, 7:30 AM [phone]

## 2015-09-16 NOTE — Progress Notes (Signed)
Physical Therapy Treatment Patient Details Name: Stacy [name] MRN: [id number] DOB: 17-Nov-1960 Today's Date: 09/16/2015    History of Present Illness Admitted for RTHA;  has a past medical history of Fibromyalgia; Depression; Heart murmur; Arthritis; Complication of anesthesia; MVP (mitral valve prolapse); and Headache.  has past surgical history that includes Cardiac surgery; Neck surgery; Rotator cuff repair (Right)    PT Comments    Patient is progressing toward mobility goals with pain more manageable today. Pt with decreased activity tolerance but is improving. Pt lives alone and will have intermittent assistance from friends/family. Pt could benefit from another day in hospital due to pt's limited assistance she will have available upon d/c. Pt needs stair training next session for management of 5 steps to enter home. Continue to progress as tolerated.  Follow Up Recommendations  Home health PT;Supervision - Intermittent     Equipment Recommendations  Rolling walker with 5" wheels;3in1 (PT)    Recommendations for Other Services OT consult     Precautions / Restrictions Precautions Precautions: Fall Restrictions Weight Bearing Restrictions: Yes RLE Weight Bearing: Weight bearing as tolerated    Mobility  Bed Mobility               General bed mobility comments: OOB in chair upon arrival  Transfers Overall transfer level: Needs assistance Equipment used: Rolling walker (2 wheeled) Transfers: Sit to/from Stand (from recliner and commode) Sit to Stand: Min guard         General transfer comment: pt needed extra time but no physical assist needed; vc for hand placement with carry demonstrated through session   Ambulation/Gait Ambulation/Gait assistance: Min guard Ambulation Distance (Feet): 75 Feet Assistive device: Rolling walker (2 wheeled) Gait Pattern/deviations: Step-to pattern;Step-through pattern;Decreased step length - left;Decreased stance time -  right;Antalgic;Trunk flexed     General Gait Details: vc for engaging R quad before WS to R LE for increased stability; pt with tendency to keep R knee slightly flexion and is guarded; vc for maintaining upright posture   Stairs            Wheelchair Mobility    Modified Rankin (Stroke Patients Only)       Balance Overall balance assessment: Needs assistance Sitting-balance support: Bilateral upper extremity supported Sitting balance-Leahy Scale: Fair     Standing balance support: No upper extremity supported Standing balance-Leahy Scale: Fair Standing balance comment: able to maintain balance for hand hygiene at sink without UE support                    Cognition Arousal/Alertness: Awake/alert Behavior During Therapy: WFL for tasks assessed/performed;Restless Overall Cognitive Status: Within Functional Limits for tasks assessed                      Exercises      General Comments        Pertinent Vitals/Pain Pain Assessment: 0-10 Pain Score: 7  Pain Location: R hip Pain Descriptors / Indicators: Sore Pain Intervention(s): Limited activity within patient's tolerance;Monitored during session;RN gave pain meds during session;Ice applied    Home Living                      Prior Function            PT Goals (current goals can now be found in the care plan section) Acute Rehab PT Goals Patient Stated Goal: Did not state PT Goal Formulation: With patient Time For Goal Achievement:  09/29/15 Potential to Achieve Goals: Good Progress towards PT goals: Progressing toward goals    Frequency  7X/week    PT Plan Current plan remains appropriate    Co-evaluation PT/OT/SLP Co-Evaluation/Treatment: Yes Reason for Co-Treatment: For patient/therapist safety PT goals addressed during session: Mobility/safety with mobility       End of Session Equipment Utilized During Treatment: Gait belt Activity Tolerance: Patient limited by  pain Patient left: in chair;with call bell/phone within reach;with chair alarm set     Time: 0927-0950 PT Time Calculation (min) (ACUTE ONLY): 23 min  Charges:  $Gait Training: 8-22 mins                    G Codes:      Sal[place] April, PTA Pager: [phone]   09/16/2015, 10:10 AM

## 2015-09-16 NOTE — Progress Notes (Signed)
Occupational Therapy Treatment Patient Details Name: Stacy [name] MRN: [id number] DOB: 1960-10-10 Today's Date: 09/16/2015    History of present illness Admitted for RTHA;  has a past medical history of Fibromyalgia; Depression; Heart murmur; Arthritis; Complication of anesthesia; MVP (mitral valve prolapse); and Headache.  has past surgical history that includes Cardiac surgery; Neck surgery; Rotator cuff repair (Right)   OT comments  Pt making progress toward OT goals. Pt currently min guard for functional mobility to bathroom for toilet transfer, toilet hygiene, and grooming in standing. Will continue to follow acutely.    Follow Up Recommendations  Home health OT;Supervision - Intermittent    Equipment Recommendations  3 in 1 bedside comode    Recommendations for Other Services      Precautions / Restrictions Precautions Precautions: Fall Restrictions Weight Bearing Restrictions: Yes RLE Weight Bearing: Weight bearing as tolerated       Mobility Bed Mobility               General bed mobility comments: Pt OOB in chair  Transfers Overall transfer level: Needs assistance Equipment used: Rolling walker (2 wheeled) Transfers: Sit to/from Stand Sit to Stand: Min guard         General transfer comment: Min guard for safety; no physical assist needed. VC for hand placement.    Balance Overall balance assessment: Needs assistance Sitting-balance support: Bilateral upper extremity supported Sitting balance-Leahy Scale: Fair     Standing balance support: No upper extremity supported;During functional activity Standing balance-Leahy Scale: Fair Standing balance comment: Able to stand at sink and wash hand with no UE support.                   ADL Overall ADL's : Needs assistance/impaired     Grooming: Min guard;Wash/dry hands;Standing                   Armed forces technical officer: Min guard;Ambulation;BSC;RW (BSC over toilet)   Toileting- Clothing  Manipulation and Hygiene: Min guard;Sitting/lateral lean       Functional mobility during ADLs: Min guard;Rolling walker General ADL Comments: No family present for OT session. Educated on home safety, ice for edema and pain.      Vision                     Perception     Praxis      Cognition   Behavior During Therapy: [facility] for tasks assessed/performed;Restless Overall Cognitive Status: Within Functional Limits for tasks assessed                       Extremity/Trunk Assessment               Exercises     Shoulder Instructions       General Comments      Pertinent Vitals/ Pain       Pain Assessment: 0-10 Pain Score: 7  Pain Location: R hip Pain Descriptors / Indicators: Sore;Grimacing Pain Intervention(s): Limited activity within patient's tolerance;Monitored during session;Ice applied;RN gave pain meds during session  Home Living                                          Prior Functioning/Environment              Frequency Min 2X/week     Progress Toward Goals  OT  Goals(current goals can now be found in the care plan section)  Progress towards OT goals: Progressing toward goals  Acute Rehab OT Goals Patient Stated Goal: Did not state  Plan Discharge plan remains appropriate    Co-evaluation    PT/OT/SLP Co-Evaluation/Treatment: Yes Reason for Co-Treatment: For patient/therapist safety PT goals addressed during session: Mobility/safety with mobility OT goals addressed during session: ADL's and self-care      End of Session Equipment Utilized During Treatment: Gait belt;Rolling walker   Activity Tolerance Patient tolerated treatment well   Patient Left in chair;with call bell/phone within reach   Nurse Communication          Time: 0927-0950 OT Time Calculation (min): 23 min  Charges: OT General Charges $OT Visit: 1 Procedure OT Treatments $Self Care/Home Management : 8-22 mins  Binnie Kand M.S., OTR/L Pager: [phone]  09/16/2015, 11:17 AM

## 2015-09-17 LAB — CBC
HCT: 29.9 % — ABNORMAL LOW (ref 36.0–46.0)
HEMOGLOBIN: 10 g/dL — AB (ref 12.0–15.0)
MCH: 30 pg (ref 26.0–34.0)
MCHC: 33.4 g/dL (ref 30.0–36.0)
MCV: 89.8 fL (ref 78.0–100.0)
PLATELETS: 223 10*3/uL (ref 150–400)
RBC: 3.33 MIL/uL — ABNORMAL LOW (ref 3.87–5.11)
RDW: 13.3 % (ref 11.5–15.5)
WBC: 11 10*3/uL — ABNORMAL HIGH (ref 4.0–10.5)

## 2015-09-17 NOTE — Discharge Summary (Signed)
Physician Discharge Summary      Patient ID: Stacy [name] MRN: [id number] DOB/AGE: [birth year] 54 y.o.  Admit date: 09/14/2015 Discharge date: 09/17/2015  Admission Diagnoses:  <principal problem not specified>  Discharge Diagnoses:  Active Problems:   Osteoarthritis of right hip   Hip arthritis   Past Medical History  Diagnosis Date  . Fibromyalgia   . Depression   . Heart murmur   . Arthritis   . Complication of anesthesia     DIFFICULTY WAKING UP , LAST SURGERY NECK 2012 WAS FINE PER PT HERE AT [facility]  . MVP (mitral valve prolapse)     AS INFANT  . Headache     HX MIGRAINES      Surgeries: Procedure(s): RIGHT TOTAL HIP ARTHROPLASTY ANTERIOR APPROACH on 09/14/2015   Consultants (if any):    Discharged Condition: Improved  Hospital Course: Stacy [name] is an 54 y.o. female who was admitted 09/14/2015 with a diagnosis of <principal problem not specified> and went to the operating room on 09/14/2015 and underwent the above named procedures.    She was given perioperative antibiotics:  Anti-infectives    Start     Dose/Rate Route Frequency Ordered Stop   09/14/15 1700  ceFAZolin (ANCEF) IVPB 2 g/50 mL premix     2 g 100 mL/hr over 30 Minutes Intravenous Every 6 hours 09/14/15 1638 09/15/15 0459   09/14/15 1100  ceFAZolin (ANCEF) IVPB 2 g/50 mL premix     2 g 100 mL/hr over 30 Minutes Intravenous To ShortStay Surgical 09/13/15 1011 09/14/15 1058    .  She was given sequential compression devices, early ambulation, and aspirin for DVT prophylaxis.  She benefited maximally from the hospital stay and there were no complications.    Recent vital signs:  Filed Vitals:   09/16/15 2114 09/17/15 0503  BP: 133/71 118/65  Pulse: 97 77  Temp: 98.7 F (37.1 C) 98.4 F (36.9 C)  Resp: 18 18    Recent laboratory studies:  Lab Results  Component Value Date   HGB 10.0* 09/17/2015   HGB 11.4* 09/16/2015   HGB 11.3* 09/15/2015   Lab Results  Component Value Date    WBC 11.0* 09/17/2015   PLT 223 09/17/2015   Lab Results  Component Value Date   INR 1.03 09/05/2015   Lab Results  Component Value Date   NA 138 09/15/2015   K 3.5 09/15/2015   CL 106 09/15/2015   CO2 25 09/15/2015   BUN 6 09/15/2015   CREATININE 0.84 09/15/2015   GLUCOSE 105* 09/15/2015    Discharge Medications:     Medication List    STOP taking these medications        HYDROcodone-acetaminophen 5-325 MG tablet  Commonly known as:  NORCO/VICODIN  Replaced by:  HYDROcodone-acetaminophen 10-325 MG tablet      TAKE these medications        aspirin EC 325 MG tablet  Take 1 tablet (325 mg total) by mouth 2 (two) times daily.     HYDROcodone-acetaminophen 10-325 MG tablet  Commonly known as:  NORCO  Take 1-2 tablets by mouth every 4 (four) hours as needed.     ibuprofen 200 MG tablet  Commonly known as:  ADVIL,MOTRIN  Take 800 mg by mouth every 4 (four) hours as needed.     methocarbamol 750 MG tablet  Commonly known as:  ROBAXIN  Take 1 tablet (750 mg total) by mouth 2 (two) times daily as needed for muscle spasms.  ondansetron 4 MG tablet  Commonly known as:  ZOFRAN  Take 1-2 tablets (4-8 mg total) by mouth every 8 (eight) hours as needed for nausea or vomiting.     senna-docusate 8.6-50 MG tablet  Commonly known as:  SENOKOT S  Take 1 tablet by mouth at bedtime as needed.        Diagnostic Studies: Dg Pelvis Portable  09/14/2015  CLINICAL DATA:  Status post right total prosthesis insertion. EXAM: PORTABLE PELVIS 1-2 VIEWS COMPARISON:  CT scan of the abdomen and pelvis dated 09/17/2011 FINDINGS: The patient has undergone right total hip replacement. The components appear in excellent position in the AP projection. No fractures. IMPRESSION: Satisfactory appearance of the right hip in the AP projection after total hip prosthesis insertion. Electronically Signed   By: Lorriane Shire M.D.   On: 09/14/2015 14:35   Dg Hip Operative Unilat With Pelvis  Right  09/14/2015  CLINICAL DATA:  Status post right hip arthroplasty. EXAM: OPERATIVE right HIP (WITH PELVIS IF PERFORMED) 2 VIEWS TECHNIQUE: Fluoroscopic spot image(s) were submitted for interpretation post-operatively. FLUOROSCOPY TIME:  32 seconds. COMPARISON:  June 27, 2015. FINDINGS: Two intraoperative fluoroscopic images demonstrate the patient be status post total right hip arthroplasty. The femoral and acetabular components appear to be well situated. IMPRESSION: Status post right total hip arthroplasty. Electronically Signed   By: Marijo Conception, M.D.   On: 09/14/2015 13:21    Disposition: 01-Home or Self Care      Discharge Instructions    Call MD / Call 911    Complete by:  As directed   If you experience chest pain or shortness of breath, CALL 911 and be transported to the hospital emergency room.  If you develope a fever above 101.5 F, pus (white drainage) or increased drainage or redness at the wound, or calf pain, call your surgeon's office.     Call MD / Call 911    Complete by:  As directed   If you experience chest pain or shortness of breath, CALL 911 and be transported to the hospital emergency room.  If you develope a fever above 101.5 F, pus (white drainage) or increased drainage or redness at the wound, or calf pain, call your surgeon's office.     Constipation Prevention    Complete by:  As directed   Drink plenty of fluids.  Prune juice may be helpful.  You may use a stool softener, such as Colace (over the counter) 100 mg twice a day.  Use MiraLax (over the counter) for constipation as needed.     Constipation Prevention    Complete by:  As directed   Drink plenty of fluids.  Prune juice may be helpful.  You may use a stool softener, such as Colace (over the counter) 100 mg twice a day.  Use MiraLax (over the counter) for constipation as needed.     Diet - low sodium heart healthy    Complete by:  As directed      Diet - low sodium heart healthy    Complete by:   As directed      Diet general    Complete by:  As directed      Diet general    Complete by:  As directed      Driving restrictions    Complete by:  As directed   No driving while taking narcotic pain meds.     Driving restrictions    Complete by:  As directed  No driving while taking narcotic pain meds.     Increase activity slowly as tolerated    Complete by:  As directed      Increase activity slowly as tolerated    Complete by:  As directed            Follow-up Information    Follow up with Marianna Payment, MD In 2 weeks.   Specialty:  Orthopedic Surgery   Why:  For suture removal, For wound re-check   Contact information:   [place] [address] [phone]       Follow up with [facility].   Specialty:  Home Health Services   Why:  They will contact you to schedule home therapy visits.   Contact information:   [address] [place] [address] [phone]        Signed: Marianna Payment 09/17/2015, 9:55 AM

## 2015-09-17 NOTE — Progress Notes (Signed)
Physical Therapy Treatment Patient Details Name: Stacy [name] MRN: [id number] DOB: 1960-10-23 Today's Date: 09/17/2015    History of Present Illness Admitted for RTHA;  has a past medical history of Fibromyalgia; Depression; Heart murmur; Arthritis; Complication of anesthesia; MVP (mitral valve prolapse); and Headache.  has past surgical history that includes Cardiac surgery; Neck surgery; Rotator cuff repair (Right)    PT Comments    [name] is very pleasant, moving well but limited with RLE stance time. Educated for all HEP with handout given and encouraged continued performance throughout the day. Will continue to follow. Has met goals to allow D/C home.   Follow Up Recommendations  Home health PT;Supervision - Intermittent     Equipment Recommendations  Rolling walker with 5" wheels;3in1 (PT)    Recommendations for Other Services       Precautions / Restrictions Restrictions RLE Weight Bearing: Weight bearing as tolerated    Mobility  Bed Mobility Overal bed mobility: Modified Independent             General bed mobility comments: heavy reliance on rail plans to continue to sleep in recliner at home  Transfers Overall transfer level: Needs assistance     Sit to Stand: Supervision         General transfer comment: cues for hand placement as pt pulling up on RW despite education  Ambulation/Gait Ambulation/Gait assistance: Supervision Ambulation Distance (Feet): 180 Feet Assistive device: Rolling walker (2 wheeled) Gait Pattern/deviations: Step-through pattern;Decreased stride length;Decreased stance time - right   Gait velocity interpretation: Below normal speed for age/gender General Gait Details: pt with short stance time right and tends to quickly hop off of the RLE to unweight it. Cues for increased hip flexion and increased stance time RLE   Stairs Stairs: Yes Stairs assistance: Modified independent (Device/Increase time) Stair Management: Two  rails;Step to pattern;Forwards Number of Stairs: 4    Wheelchair Mobility    Modified Rankin (Stroke Patients Only)       Balance                                    Cognition Arousal/Alertness: Awake/alert Behavior During Therapy: WFL for tasks assessed/performed Overall Cognitive Status: Within Functional Limits for tasks assessed                      Exercises Total Joint Exercises Gluteal Sets: AROM;Seated;Both;15 reps Hip ABduction/ADduction: AROM;Seated;Right;15 reps Long Arc Quad: AROM;Seated;Right;15 reps Marching in Standing: AAROM;Seated;Right;15 reps    General Comments        Pertinent Vitals/Pain Pain Score: 4  Pain Location: right hip Pain Descriptors / Indicators: Aching Pain Intervention(s): Limited activity within patient's tolerance;Monitored during session;Repositioned    Home Living                      Prior Function            PT Goals (current goals can now be found in the care plan section) Progress towards PT goals: Progressing toward goals    Frequency       PT Plan Current plan remains appropriate    Co-evaluation             End of Session   Activity Tolerance: Patient tolerated treatment well Patient left: in chair;with call bell/phone within reach     Time: 0735-0801 PT Time Calculation (min) (ACUTE ONLY): 26 min  Charges:  $Gait Training: 8-22 mins $Therapeutic Exercise: 8-22 mins                    G Codes:      Melford Aase Sep 30, 2015, 8:06 AM Elwyn Reach, [place]

## 2015-09-17 NOTE — Progress Notes (Signed)
Pt ready for d/c home per MD. Cleared by PT/OT, equipment delivered to room. Discharge instructions and prescriptions reviewed with pt and her daughter, denied any questions. Will be assisted to car via NT.  [place], Jerry Caras

## 2015-09-17 NOTE — Progress Notes (Signed)
   Subjective:  Patient reports pain as improved.  Objective:   VITALS:   Filed Vitals:   09/16/15 0550 09/16/15 1246 09/16/15 2114 09/17/15 0503  BP: 121/82 137/81 133/71 118/65  Pulse: 77 82 97 77  Temp: 98.8 F (37.1 C) 98.6 F (37 C) 98.7 F (37.1 C) 98.4 F (36.9 C)  TempSrc: Oral  Oral Oral  Resp: 16 18 18 18   Height:      Weight:      SpO2: 100% 98% 91% 94%    Neurologically intact Neurovascular intact Sensation intact distally Intact pulses distally Dorsiflexion/Plantar flexion intact Incision: dressing C/D/I and no drainage No cellulitis present Compartment soft   Lab Results  Component Value Date   WBC 11.0* 09/17/2015   HGB 10.0* 09/17/2015   HCT 29.9* 09/17/2015   MCV 89.8 09/17/2015   PLT 223 09/17/2015     Assessment/Plan:  3 Days Post-Op   - stable for dc - dressing c/d/i - cleared PT  Marianna Payment 09/17/2015, 10:12 AM [phone]

## 2015-09-18 HISTORY — PX: BREAST BIOPSY: SHX20

## 2015-09-18 HISTORY — PX: BREAST LUMPECTOMY: SHX2

## 2015-09-20 ENCOUNTER — Telehealth: Payer: Self-pay | Admitting: Internal Medicine

## 2015-09-20 NOTE — Telephone Encounter (Signed)
Ok to continue PT.

## 2015-09-20 NOTE — Telephone Encounter (Signed)
Aware as instructed

## 2015-09-20 NOTE — Telephone Encounter (Signed)
April @ [facility] called she needs ongoing orders for  PT for 2 x week for 4 weeks Verbal order

## 2016-02-15 ENCOUNTER — Encounter: Payer: Self-pay | Admitting: Adult Health

## 2016-02-15 ENCOUNTER — Ambulatory Visit (INDEPENDENT_AMBULATORY_CARE_PROVIDER_SITE_OTHER): Payer: BLUE CROSS/BLUE SHIELD | Admitting: Adult Health

## 2016-02-15 ENCOUNTER — Telehealth: Payer: Self-pay | Admitting: Internal Medicine

## 2016-02-15 VITALS — BP 160/104 | Temp 98.4°F | Ht 64.0 in | Wt 216.0 lb

## 2016-02-15 DIAGNOSIS — R51 Headache: Secondary | ICD-10-CM | POA: Diagnosis not present

## 2016-02-15 DIAGNOSIS — I10 Essential (primary) hypertension: Secondary | ICD-10-CM

## 2016-02-15 LAB — CBC WITH DIFFERENTIAL/PLATELET
BASOS ABS: 0 10*3/uL (ref 0.0–0.1)
Basophils Relative: 0.2 % (ref 0.0–3.0)
Eosinophils Absolute: 0.1 10*3/uL (ref 0.0–0.7)
Eosinophils Relative: 0.8 % (ref 0.0–5.0)
HEMATOCRIT: 42.5 % (ref 36.0–46.0)
HEMOGLOBIN: 14.3 g/dL (ref 12.0–15.0)
LYMPHS PCT: 18.6 % (ref 12.0–46.0)
Lymphs Abs: 2 10*3/uL (ref 0.7–4.0)
MCHC: 33.5 g/dL (ref 30.0–36.0)
MCV: 86.9 fl (ref 78.0–100.0)
MONOS PCT: 5.2 % (ref 3.0–12.0)
Monocytes Absolute: 0.6 10*3/uL (ref 0.1–1.0)
NEUTROS PCT: 75.2 % (ref 43.0–77.0)
Neutro Abs: 8 10*3/uL — ABNORMAL HIGH (ref 1.4–7.7)
Platelets: 289 10*3/uL (ref 150.0–400.0)
RBC: 4.89 Mil/uL (ref 3.87–5.11)
RDW: 14.4 % (ref 11.5–15.5)
WBC: 10.6 10*3/uL — AB (ref 4.0–10.5)

## 2016-02-15 LAB — BASIC METABOLIC PANEL
BUN: 12 mg/dL (ref 6–23)
CALCIUM: 9.8 mg/dL (ref 8.4–10.5)
CO2: 26 mEq/L (ref 19–32)
Chloride: 108 mEq/L (ref 96–112)
Creatinine, Ser: 0.84 mg/dL (ref 0.40–1.20)
GFR: 75.55 mL/min (ref >60.00)
Glucose, Bld: 95 mg/dL (ref 70–99)
Potassium: 4.9 mEq/L (ref 3.5–5.1)
SODIUM: 140 meq/L (ref 135–145)

## 2016-02-15 MED ORDER — LISINOPRIL 10 MG PO TABS: 10.0000 mg | ORAL_TABLET | Freq: Every day | ORAL | Status: DC

## 2016-02-15 NOTE — Progress Notes (Signed)
Subjective:    Patient ID: Stacy [name], female    DOB: Jul 13, 1961, 55 y.o.   MRN: [id number]  HPI  55 year old female, patient of Webb Silversmith, NP. She presents to the office today for hypertension and headache with blurred vision. She was told that she had hypertension back in December when she was having hip surgery but never went to see her PCP. More recently as she was having her employment physical it was noticed that her blood pressure was into the 150-160/90-100's, yet she still did not see her PCP.   This morning she woke up with a bounding frontal headache and slight blurred vision.   Review of Systems  Constitutional: Negative.   Respiratory: Negative.   Cardiovascular: Negative.   Gastrointestinal: Negative.   Neurological: Positive for headaches. Negative for dizziness, weakness, light-headedness and numbness.  Psychiatric/Behavioral: Negative.   All other systems reviewed and are negative.  Past Medical History  Diagnosis Date  . Fibromyalgia   . Depression   . Heart murmur   . Arthritis   . Complication of anesthesia     DIFFICULTY WAKING UP , LAST SURGERY NECK 2012 WAS FINE PER PT HERE AT [facility]  . MVP (mitral valve prolapse)     AS INFANT  . Headache     HX MIGRAINES      Social History   Social History  . Marital Status: Married    Spouse Name: N/A  . Number of Children: N/A  . Years of Education: N/A   Occupational History  . Not on file.   Social History Main Topics  . Smoking status: Current Every Day Smoker -- 0.25 packs/day    Types: Cigarettes  . Smokeless tobacco: Former Systems developer  . Alcohol Use: No  . Drug Use: No  . Sexual Activity: Yes    Birth Control/ Protection: Post-menopausal   Other Topics Concern  . Not on file   Social History Narrative    Past Surgical History  Procedure Laterality Date  . Cardiac surgery      MVP 1970  . Neck surgery    . Rotator cuff repair Right   . Tubal ligation    . Total hip arthroplasty Right  09/14/2015    Procedure: RIGHT TOTAL HIP ARTHROPLASTY ANTERIOR APPROACH;  Surgeon: Leandrew Koyanagi, MD;  Location: [facility];  Service: Orthopedics;  Laterality: Right;    Family History  Problem Relation Age of Onset  . Adopted: Yes    Allergies  Allergen Reactions  . Naproxen Palpitations  . Erythromycin Nausea And Vomiting  . Tramadol Hcl Other (See Comments)    Headaches  . Gabapentin   . Percocet [Oxycodone-Acetaminophen]     Current Outpatient Prescriptions on File Prior to Visit  Medication Sig Dispense Refill  . ibuprofen (ADVIL,MOTRIN) 200 MG tablet Take 800 mg by mouth every 4 (four) hours as needed.      No current facility-administered medications on file prior to visit.    BP 160/104 mmHg  Temp(Src) 98.4 F (36.9 C) (Oral)  Ht 5\' 4"  (1.626 m)  Wt 216 lb (97.977 kg)  BMI 37.06 kg/m2       Objective:   Physical Exam  Constitutional: She is oriented to person, place, and time. She appears well-developed and well-nourished. No distress.  Eyes: Conjunctivae and EOM are normal. Pupils are equal, round, and reactive to light. Right eye exhibits no discharge. No scleral icterus.  Cardiovascular: Normal rate, regular rhythm, normal heart sounds and  intact distal pulses.  Exam reveals no gallop.   No murmur heard. Pulmonary/Chest: Effort normal and breath sounds normal.  Neurological: She is alert and oriented to person, place, and time.  Skin: Skin is warm and dry. No rash noted. She is not diaphoretic. No erythema. No pallor.  Psychiatric: She has a normal mood and affect. Her behavior is normal. Judgment and thought content normal.  Nursing note and vitals reviewed.     Assessment & Plan:  1. Essential hypertension - Headache likely from hypertension.  - lisinopril (PRINIVIL,ZESTRIL) 10 MG tablet; Take 1 tablet (10 mg total) by mouth daily.  Dispense: 30 tablet; Refill: 1 - Basic metabolic panel - CBC with Differential/Platelet - Monitor BP at home and bring log  to next visit with PCP - Follow up with PCP in one week or sooner if needed  Dorothyann Peng, NP

## 2016-02-15 NOTE — Telephone Encounter (Signed)
Will route to Cory as FYI. 

## 2016-02-15 NOTE — Patient Instructions (Addendum)
It was great meeting you today!  I will follow up with you regarding your blood work   I have sent in a prescription for Lisinopril 10 mg. Take this daily.   Monitor your blood pressure at home and bring a log with you when you see Stacy [name] next.   Follow up with Stacy [name] in one week  Hypertension Hypertension, commonly called high blood pressure, is when the force of blood pumping through your arteries is too strong. Your arteries are the blood vessels that carry blood from your heart throughout your body. A blood pressure reading consists of a higher number over a lower number, such as 110/72. The higher number (systolic) is the pressure inside your arteries when your heart pumps. The lower number (diastolic) is the pressure inside your arteries when your heart relaxes. Ideally you want your blood pressure below 120/80. Hypertension forces your heart to work harder to pump blood. Your arteries may become narrow or stiff. Having untreated or uncontrolled hypertension can cause heart attack, stroke, kidney disease, and other problems. RISK FACTORS Some risk factors for high blood pressure are controllable. Others are not.  Risk factors you cannot control include:   Race. You may be at higher risk if you are African American.  Age. Risk increases with age.  Gender. Men are at higher risk than women before age 48 years. After age 42, women are at higher risk than men. Risk factors you can control include:  Not getting enough exercise or physical activity.  Being overweight.  Getting too much fat, sugar, calories, or salt in your diet.  Drinking too much alcohol. SIGNS AND SYMPTOMS Hypertension does not usually cause signs or symptoms. Extremely high blood pressure (hypertensive crisis) may cause headache, anxiety, shortness of breath, and nosebleed. DIAGNOSIS To check if you have hypertension, your health care provider will measure your blood pressure while you are seated, with your arm  held at the level of your heart. It should be measured at least twice using the same arm. Certain conditions can cause a difference in blood pressure between your right and left arms. A blood pressure reading that is higher than normal on one occasion does not mean that you need treatment. If it is not clear whether you have high blood pressure, you may be asked to return on a different day to have your blood pressure checked again. Or, you may be asked to monitor your blood pressure at home for 1 or more weeks. TREATMENT Treating high blood pressure includes making lifestyle changes and possibly taking medicine. Living a healthy lifestyle can help lower high blood pressure. You may need to change some of your habits. Lifestyle changes may include:  Following the DASH diet. This diet is high in fruits, vegetables, and whole grains. It is low in salt, red meat, and added sugars.  Keep your sodium intake below 2,300 mg per day.  Getting at least 30-45 minutes of aerobic exercise at least 4 times per week.  Losing weight if necessary.  Not smoking.  Limiting alcoholic beverages.  Learning ways to reduce stress. Your health care provider may prescribe medicine if lifestyle changes are not enough to get your blood pressure under control, and if one of the following is true:  You are 29-22 years of age and your systolic blood pressure is above 140.  You are 66 years of age or older, and your systolic blood pressure is above 150.  Your diastolic blood pressure is above 90.  You have  diabetes, and your systolic blood pressure is over [id number] or your diastolic blood pressure is over 90.  You have kidney disease and your blood pressure is above 140/90.  You have heart disease and your blood pressure is above 140/90. Your personal target blood pressure may vary depending on your medical conditions, your age, and other factors. HOME CARE INSTRUCTIONS  Have your blood pressure rechecked as directed  by your health care provider.   Take medicines only as directed by your health care provider. Follow the directions carefully. Blood pressure medicines must be taken as prescribed. The medicine does not work as well when you skip doses. Skipping doses also puts you at risk for problems.  Do not smoke.   Monitor your blood pressure at home as directed by your health care provider. SEEK MEDICAL CARE IF:   You think you are having a reaction to medicines taken.  You have recurrent headaches or feel dizzy.  You have swelling in your ankles.  You have trouble with your vision. SEEK IMMEDIATE MEDICAL CARE IF:  You develop a severe headache or confusion.  You have unusual weakness, numbness, or feel faint.  You have severe chest or abdominal pain.  You vomit repeatedly.  You have trouble breathing. MAKE SURE YOU:   Understand these instructions.  Will watch your condition.  Will get help right away if you are not doing well or get worse.   This information is not intended to replace advice given to you by your health care provider. Make sure you discuss any questions you have with your health care provider.   Document Released: 09/03/2005 Document Revised: 01/18/2015 Document Reviewed: 06/26/2013 Elsevier Interactive Patient Education [employer].

## 2016-02-15 NOTE — Telephone Encounter (Signed)
Patient Name: Stacy [name]  DOB: [birth year]    Initial Comment Caller states c/o headache, dizziness, elevated blood pressure since December   Nurse Assessment  Nurse: Mallie Mussel, RN, Alveta Heimlich Date/Time Eilene Ghazi Time): 02/15/2016 8:17:40 AM  Confirm and document reason for call. If symptomatic, describe symptoms. You must click the next button to save text entered. ---Caller states that she has had elevated BP since December. She is not currently on medication for this. She has a headache which she rates as 5 on 0-10 scale. She does have blurry vision but no dizziness at present. She denies unilateral weakness and numbness at present.  Has the patient traveled out of the country within the last 30 days? ---No  Does the patient have any new or worsening symptoms? ---Yes  Will a triage be completed? ---Yes  Related visit to physician within the last 2 weeks? ---No  Does the PT have any chronic conditions? (i.e. diabetes, asthma, etc.) ---Yes  List chronic conditions. ---Fibromyalgia  Is the patient pregnant or possibly pregnant? (Ask all females between the ages of 39-55) ---No  Is this a behavioral health or substance abuse call? ---No     Guidelines    Guideline Title Affirmed Question Affirmed Notes  Headache [1] MODERATE headache (e.g., interferes with normal activities) AND [2] present > 24 hours AND [3] unexplained (Exceptions: analgesics not tried, typical migraine, or headache part of viral illness)    Final Disposition User   See Physician within [place], RN, [employer] does not have any appointments today. No other appointments available at [facility]. I scheduled an appointment with Dorothyann Peng NP at 11:00am today.   Referrals  REFERRED TO PCP OFFICE   Disagree/Comply: Comply

## 2016-02-17 DIAGNOSIS — Z136 Encounter for screening for cardiovascular disorders: Secondary | ICD-10-CM | POA: Diagnosis not present

## 2016-02-20 DIAGNOSIS — Z136 Encounter for screening for cardiovascular disorders: Secondary | ICD-10-CM | POA: Diagnosis not present

## 2016-02-22 DIAGNOSIS — Z136 Encounter for screening for cardiovascular disorders: Secondary | ICD-10-CM | POA: Diagnosis not present

## 2016-03-06 ENCOUNTER — Ambulatory Visit (INDEPENDENT_AMBULATORY_CARE_PROVIDER_SITE_OTHER): Payer: BLUE CROSS/BLUE SHIELD | Admitting: Internal Medicine

## 2016-03-06 ENCOUNTER — Encounter: Payer: Self-pay | Admitting: Internal Medicine

## 2016-03-06 VITALS — BP 150/100 | HR 72 | Temp 98.2°F | Wt 218.0 lb

## 2016-03-06 DIAGNOSIS — I10 Essential (primary) hypertension: Secondary | ICD-10-CM

## 2016-03-06 DIAGNOSIS — R635 Abnormal weight gain: Secondary | ICD-10-CM | POA: Diagnosis not present

## 2016-03-06 DIAGNOSIS — L659 Nonscarring hair loss, unspecified: Secondary | ICD-10-CM

## 2016-03-06 LAB — TSH: TSH: 2.46 u[IU]/mL (ref 0.35–4.50)

## 2016-03-06 MED ORDER — HYDROCHLOROTHIAZIDE 25 MG PO TABS: 25.0000 mg | ORAL_TABLET | Freq: Every day | ORAL | Status: DC

## 2016-03-06 NOTE — Patient Instructions (Signed)
Hypertension Hypertension, commonly called high blood pressure, is when the force of blood pumping through your arteries is too strong. Your arteries are the blood vessels that carry blood from your heart throughout your body. A blood pressure reading consists of a higher number over a lower number, such as 110/72. The higher number (systolic) is the pressure inside your arteries when your heart pumps. The lower number (diastolic) is the pressure inside your arteries when your heart relaxes. Ideally you want your blood pressure below 120/80. Hypertension forces your heart to work harder to pump blood. Your arteries may become narrow or stiff. Having untreated or uncontrolled hypertension can cause heart attack, stroke, kidney disease, and other problems. RISK FACTORS Some risk factors for high blood pressure are controllable. Others are not.  Risk factors you cannot control include:   Race. You may be at higher risk if you are African American.  Age. Risk increases with age.  Gender. Men are at higher risk than women before age 45 years. After age 65, women are at higher risk than men. Risk factors you can control include:  Not getting enough exercise or physical activity.  Being overweight.  Getting too much fat, sugar, calories, or salt in your diet.  Drinking too much alcohol. SIGNS AND SYMPTOMS Hypertension does not usually cause signs or symptoms. Extremely high blood pressure (hypertensive crisis) may cause headache, anxiety, shortness of breath, and nosebleed. DIAGNOSIS To check if you have hypertension, your health care provider will measure your blood pressure while you are seated, with your arm held at the level of your heart. It should be measured at least twice using the same arm. Certain conditions can cause a difference in blood pressure between your right and left arms. A blood pressure reading that is higher than normal on one occasion does not mean that you need treatment. If  it is not clear whether you have high blood pressure, you may be asked to return on a different day to have your blood pressure checked again. Or, you may be asked to monitor your blood pressure at home for 1 or more weeks. TREATMENT Treating high blood pressure includes making lifestyle changes and possibly taking medicine. Living a healthy lifestyle can help lower high blood pressure. You may need to change some of your habits. Lifestyle changes may include:  Following the DASH diet. This diet is high in fruits, vegetables, and whole grains. It is low in salt, red meat, and added sugars.  Keep your sodium intake below 2,300 mg per day.  Getting at least 30-45 minutes of aerobic exercise at least 4 times per week.  Losing weight if necessary.  Not smoking.  Limiting alcoholic beverages.  Learning ways to reduce stress. Your health care provider may prescribe medicine if lifestyle changes are not enough to get your blood pressure under control, and if one of the following is true:  You are 18-59 years of age and your systolic blood pressure is above 140.  You are 60 years of age or older, and your systolic blood pressure is above 150.  Your diastolic blood pressure is above 90.  You have diabetes, and your systolic blood pressure is over 140 or your diastolic blood pressure is over 90.  You have kidney disease and your blood pressure is above 140/90.  You have heart disease and your blood pressure is above 140/90. Your personal target blood pressure may vary depending on your medical conditions, your age, and other factors. HOME CARE INSTRUCTIONS    Have your blood pressure rechecked as directed by your health care provider.   Take medicines only as directed by your health care provider. Follow the directions carefully. Blood pressure medicines must be taken as prescribed. The medicine does not work as well when you skip doses. Skipping doses also puts you at risk for  problems.  Do not smoke.   Monitor your blood pressure at home as directed by your health care provider. SEEK MEDICAL CARE IF:   You think you are having a reaction to medicines taken.  You have recurrent headaches or feel dizzy.  You have swelling in your ankles.  You have trouble with your vision. SEEK IMMEDIATE MEDICAL CARE IF:  You develop a severe headache or confusion.  You have unusual weakness, numbness, or feel faint.  You have severe chest or abdominal pain.  You vomit repeatedly.  You have trouble breathing. MAKE SURE YOU:   Understand these instructions.  Will watch your condition.  Will get help right away if you are not doing well or get worse.   This information is not intended to replace advice given to you by your health care provider. Make sure you discuss any questions you have with your health care provider.   Document Released: 09/03/2005 Document Revised: 01/18/2015 Document Reviewed: 06/26/2013 Elsevier Interactive Patient Education 2016 Elsevier Inc.  

## 2016-03-06 NOTE — Progress Notes (Signed)
Subjective:    Patient ID: Stacy [name], female    DOB: Jan 26, 1961, 55 y.o.   MRN: [id number]  HPI  Pt presents to the clinic today to follow up HTN. She saw Dorothyann Peng, NP for the same on 02/15/16. BP at that time was 160/104. She was started on Lisinopril 10 mg daily. She had been experiencing headaches but denies blurred vision, dizziness, chest pain or shortness of breath. About 3 hours after she takes the Lisinopril, she starts feeling "bad", fatigued and has numbness near her right eye and mouth. She denied any difficulty with speech or swallowing. She only took the medication 2-3 days and stopped due to the side effects. Her BP today is 150/100.  She is also concerned about weight gain and hair loss. She has gained about 4 lbs in the last year. She has not changed her diet, she does not eat really eat meat. She consumes a lot of salads. She does not eat a lot of carbs. She is not exercising currently. She has not gone through menopause.  Review of Systems      Past Medical History  Diagnosis Date  . Fibromyalgia   . Depression   . Heart murmur   . Arthritis   . Complication of anesthesia     DIFFICULTY WAKING UP , LAST SURGERY NECK 2012 WAS FINE PER PT HERE AT [facility]  . MVP (mitral valve prolapse)     AS INFANT  . Headache     HX MIGRAINES      Current Outpatient Prescriptions  Medication Sig Dispense Refill  . ibuprofen (ADVIL,MOTRIN) 200 MG tablet Take 800 mg by mouth every 4 (four) hours as needed.      No current facility-administered medications for this visit.    Allergies  Allergen Reactions  . Naproxen Palpitations  . Erythromycin Nausea And Vomiting  . Tramadol Hcl Other (See Comments)    Headaches  . Gabapentin   . Percocet [Oxycodone-Acetaminophen]     Family History  Problem Relation Age of Onset  . Adopted: Yes    Social History   Social History  . Marital Status: Married    Spouse Name: N/A  . Number of Children: N/A  . Years of  Education: N/A   Occupational History  . Not on file.   Social History Main Topics  . Smoking status: Current Every Day Smoker -- 0.25 packs/day    Types: Cigarettes  . Smokeless tobacco: Former Systems developer  . Alcohol Use: No  . Drug Use: No  . Sexual Activity: Yes    Birth Control/ Protection: Post-menopausal   Other Topics Concern  . Not on file   Social History Narrative     Constitutional: Pt reports weight gain and headache. Denies fever, malaise, fatigue.  HEENT: Denies eye pain, eye redness, ear pain, ringing in the ears, wax buildup, runny nose, nasal congestion, bloody nose, or sore throat. Respiratory: Denies difficulty breathing, shortness of breath, cough or sputum production.   Cardiovascular: Denies chest pain, chest tightness, palpitations or swelling in the hands or feet.  Skin: Pt reports hair loss. Denies redness, rashes, lesions or ulcercations.  Neurological: Denies dizziness, difficulty with memory, difficulty with speech or problems with balance and coordination.  Psych: Pt reports stress. Denies anxiety, depression, SI/HI.  No other specific complaints in a complete review of systems (except as listed in HPI above).  Objective:   Physical Exam   BP 150/100 mmHg  Pulse 72  Temp(Src) 98.2 F (  36.8 C) (Oral)  Wt 218 lb (98.884 kg)  SpO2 98% Wt Readings from Last 3 Encounters:  03/06/16 218 lb (98.884 kg)  02/15/16 216 lb (97.977 kg)  09/14/15 215 lb 9 oz (97.779 kg)    General: Appears her stated age, obese in NAD. Skin: Warm, dry and intact. No bald spots noted on scalp. HEENT: Head: normal shape and size; Eyes: sclera white, no icterus, conjunctiva pink, PERRLA and EOMs intact;  Cardiovascular: Normal rate and rhythm. S1,S2 noted. Murmur noted. Trace BLE edema.  Pulmonary/Chest: Normal effort and positive vesicular breath sounds. No respiratory distress. No wheezes, rales or ronchi noted.  Neurological: Alert and oriented.  Psychiatric: Mood and  affect normal. Behavior is normal. Judgment and thought content normal.     BMET    Component Value Date/Time   NA 140 02/15/2016 1135   K 4.9 02/15/2016 1135   CL 108 02/15/2016 1135   CO2 26 02/15/2016 1135   GLUCOSE 95 02/15/2016 1135   BUN 12 02/15/2016 1135   CREATININE 0.84 02/15/2016 1135   CALCIUM 9.8 02/15/2016 1135   GFRNONAA >60 09/15/2015 0444   GFRAA >60 09/15/2015 0444    Lipid Panel     Component Value Date/Time   CHOL 144 04/20/2009 0913   TRIG 78.0 04/20/2009 0913   HDL 44.50 04/20/2009 0913   CHOLHDL 3 04/20/2009 0913   VLDL 15.6 04/20/2009 0913   LDLCALC 84 04/20/2009 0913    CBC    Component Value Date/Time   WBC 10.6* 02/15/2016 1135   RBC 4.89 02/15/2016 1135   HGB 14.3 02/15/2016 1135   HCT 42.5 02/15/2016 1135   PLT 289.0 02/15/2016 1135   MCV 86.9 02/15/2016 1135   MCH 30.0 09/17/2015 0630   MCHC 33.5 02/15/2016 1135   RDW 14.4 02/15/2016 1135   LYMPHSABS 2.0 02/15/2016 1135   MONOABS 0.6 02/15/2016 1135   EOSABS 0.1 02/15/2016 1135   BASOSABS 0.0 02/15/2016 1135    Hgb A1C Lab Results  Component Value Date   HGBA1C 5.7 01/04/2011        Assessment & Plan:   Weight gain, hair loss:  Will check TSH today Could be r/t stress or perimenopause  Will follow up after labs

## 2016-03-06 NOTE — Assessment & Plan Note (Signed)
D/c Lisinopril Start HCTZ 25 mg daily CMET at follow up visit  RTC in 3 weeks for annual exam/BP check

## 2016-03-06 NOTE — Progress Notes (Signed)
Pre visit review using our clinic review tool, if applicable. No additional management support is needed unless otherwise documented below in the visit note. 

## 2016-03-22 ENCOUNTER — Emergency Department (HOSPITAL_COMMUNITY)
Admission: EM | Admit: 2016-03-22 | Discharge: 2016-03-22 | Disposition: A | Discharge status: Home / Self Care | Payer: BLUE CROSS/BLUE SHIELD | Attending: Emergency Medicine | Admitting: Emergency Medicine

## 2016-03-22 ENCOUNTER — Encounter (HOSPITAL_COMMUNITY): Payer: Self-pay

## 2016-03-22 ENCOUNTER — Emergency Department (HOSPITAL_COMMUNITY): Payer: BLUE CROSS/BLUE SHIELD

## 2016-03-22 DIAGNOSIS — Z5181 Encounter for therapeutic drug level monitoring: Secondary | ICD-10-CM | POA: Diagnosis not present

## 2016-03-22 DIAGNOSIS — Z79899 Other long term (current) drug therapy: Secondary | ICD-10-CM | POA: Insufficient documentation

## 2016-03-22 DIAGNOSIS — Z96641 Presence of right artificial hip joint: Secondary | ICD-10-CM | POA: Insufficient documentation

## 2016-03-22 DIAGNOSIS — R200 Anesthesia of skin: Secondary | ICD-10-CM | POA: Diagnosis not present

## 2016-03-22 DIAGNOSIS — I159 Secondary hypertension, unspecified: Secondary | ICD-10-CM | POA: Diagnosis not present

## 2016-03-22 DIAGNOSIS — F17210 Nicotine dependence, cigarettes, uncomplicated: Secondary | ICD-10-CM | POA: Insufficient documentation

## 2016-03-22 HISTORY — DX: Cervicalgia: M54.2

## 2016-03-22 LAB — I-STAT TROPONIN, ED: Troponin i, poc: 0 ng/mL (ref 0.00–0.08)

## 2016-03-22 LAB — COMPREHENSIVE METABOLIC PANEL
ALBUMIN: 4 g/dL (ref 3.5–5.0)
ALK PHOS: 57 U/L (ref 38–126)
ALT: 17 U/L (ref 14–54)
AST: 21 U/L (ref 15–41)
Anion gap: 10 (ref 5–15)
BILIRUBIN TOTAL: 0.6 mg/dL (ref 0.3–1.2)
BUN: 21 mg/dL — AB (ref 6–20)
CALCIUM: 9.4 mg/dL (ref 8.9–10.3)
CO2: 23 mmol/L (ref 22–32)
Chloride: 98 mmol/L — ABNORMAL LOW (ref 101–111)
Creatinine, Ser: 1 mg/dL (ref 0.44–1.00)
GFR calc Af Amer: >60 mL/min (ref >60)
GFR calc non Af Amer: >60 mL/min (ref >60)
GLUCOSE: 115 mg/dL — AB (ref 65–99)
Potassium: 3.6 mmol/L (ref 3.5–5.1)
SODIUM: 131 mmol/L — AB (ref 135–145)
Total Protein: 7.4 g/dL (ref 6.5–8.1)

## 2016-03-22 LAB — PROTIME-INR
INR: 1 (ref 0.00–1.49)
PROTHROMBIN TIME: 13.4 s (ref 11.6–15.2)

## 2016-03-22 LAB — DIFFERENTIAL
BASOS ABS: 0 10*3/uL (ref 0.0–0.1)
Basophils Relative: 0 %
EOS PCT: 1 %
Eosinophils Absolute: 0.2 10*3/uL (ref 0.0–0.7)
LYMPHS ABS: 3.9 10*3/uL (ref 0.7–4.0)
LYMPHS PCT: 25 %
Monocytes Absolute: 0.8 10*3/uL (ref 0.1–1.0)
Monocytes Relative: 5 %
NEUTROS ABS: 10.8 10*3/uL — AB (ref 1.7–7.7)
NEUTROS PCT: 69 %

## 2016-03-22 LAB — CBC
HCT: 42.7 % (ref 36.0–46.0)
HEMOGLOBIN: 14.8 g/dL (ref 12.0–15.0)
MCH: 30.5 pg (ref 26.0–34.0)
MCHC: 34.7 g/dL (ref 30.0–36.0)
MCV: 87.9 fL (ref 78.0–100.0)
Platelets: 303 10*3/uL (ref 150–400)
RBC: 4.86 MIL/uL (ref 3.87–5.11)
RDW: 12.8 % (ref 11.5–15.5)
WBC: 15.7 10*3/uL — AB (ref 4.0–10.5)

## 2016-03-22 LAB — APTT: APTT: 29 s (ref 24–37)

## 2016-03-22 LAB — I-STAT CHEM 8, ED
BUN: 23 mg/dL — ABNORMAL HIGH (ref 6–20)
CALCIUM ION: 1.13 mmol/L (ref 1.13–1.30)
CHLORIDE: 98 mmol/L — AB (ref 101–111)
Creatinine, Ser: 1.1 mg/dL — ABNORMAL HIGH (ref 0.44–1.00)
Glucose, Bld: 113 mg/dL — ABNORMAL HIGH (ref 65–99)
HCT: 45 % (ref 36.0–46.0)
HEMOGLOBIN: 15.3 g/dL — AB (ref 12.0–15.0)
POTASSIUM: 3.7 mmol/L (ref 3.5–5.1)
SODIUM: 134 mmol/L — AB (ref 135–145)
TCO2: 24 mmol/L (ref 0–100)

## 2016-03-22 MED ORDER — PROCHLORPERAZINE EDISYLATE 5 MG/ML IJ SOLN
10.0000 mg | Freq: Once | INTRAMUSCULAR | Status: AC
Start: 2016-03-22 — End: 2016-03-22
  Administered 2016-03-22: 10 mg via INTRAMUSCULAR
  Filled 2016-03-22: qty 2

## 2016-03-22 MED ORDER — DEXAMETHASONE 4 MG PO TABS
6.0000 mg | ORAL_TABLET | Freq: Once | ORAL | Status: AC
  Administered 2016-03-22: 6 mg via ORAL
  Filled 2016-03-22: qty 2

## 2016-03-22 MED ORDER — DIPHENHYDRAMINE HCL 25 MG PO CAPS
25.0000 mg | ORAL_CAPSULE | Freq: Once | ORAL | Status: AC
  Administered 2016-03-22: 25 mg via ORAL
  Filled 2016-03-22: qty 1

## 2016-03-22 MED ORDER — HYDROCHLOROTHIAZIDE 25 MG PO TABS
25.0000 mg | ORAL_TABLET | Freq: Once | ORAL | Status: DC
  Filled 2016-03-22: qty 1

## 2016-03-22 NOTE — ED Notes (Signed)
Pt stated " I need to leave" the RN informed pt of risk of leaving.

## 2016-03-22 NOTE — ED Provider Notes (Signed)
CSN: [id number]     Arrival date & time 03/22/16  1833 History   First MD Initiated Contact with Patient 03/22/16 2038     Chief Complaint  Patient presents with  . Numbness     (Consider location/radiation/quality/duration/timing/severity/associated sxs/prior Treatment) Patient is a 55 y.o. female presenting with neurologic complaint. The history is provided by the patient.  Neurologic Problem This is a recurrent problem. The current episode started 1 to 4 weeks ago. The problem occurs daily. The problem has been unchanged. Associated symptoms include headaches and numbness. Pertinent negatives include no abdominal pain, chest pain, chills, congestion, coughing, fever, nausea, neck pain, rash, sore throat, visual change, vomiting or weakness. Nothing aggravates the symptoms. She has tried nothing for the symptoms. The treatment provided no relief.    Past Medical History  Diagnosis Date  . Fibromyalgia   . Depression   . Heart murmur   . Arthritis   . Complication of anesthesia     DIFFICULTY WAKING UP , LAST SURGERY NECK 2012 WAS FINE PER PT HERE AT [facility]  . MVP (mitral valve prolapse)     AS INFANT  . Headache     HX MIGRAINES    . Neck pain    Past Surgical History  Procedure Laterality Date  . Cardiac surgery      MVP 1970  . Neck surgery    . Rotator cuff repair Right   . Tubal ligation    . Total hip arthroplasty Right 09/14/2015    Procedure: RIGHT TOTAL HIP ARTHROPLASTY ANTERIOR APPROACH;  Surgeon: Leandrew Koyanagi, MD;  Location: [place];  Service: Orthopedics;  Laterality: Right;   Family History  Problem Relation Age of Onset  . Adopted: Yes   Social History  Substance Use Topics  . Smoking status: Current Every Day Smoker -- 0.25 packs/day    Types: Cigarettes  . Smokeless tobacco: Former Systems developer  . Alcohol Use: No   OB History    No data available     Review of Systems  Constitutional: Negative for fever and chills.  HENT: Negative for congestion and sore  throat.        Cramping of face  Eyes: Negative for pain.  Respiratory: Negative for cough and shortness of breath.   Cardiovascular: Negative for chest pain and palpitations.  Gastrointestinal: Negative for nausea, vomiting, abdominal pain and diarrhea.  Genitourinary: Negative for dysuria and flank pain.  Musculoskeletal: Negative for back pain and neck pain.  Skin: Negative for rash.  Allergic/Immunologic: Negative.   Neurological: Positive for numbness and headaches. Negative for dizziness, weakness and light-headedness.  Psychiatric/Behavioral: Negative for confusion.      Allergies  Naproxen; Erythromycin; Tramadol hcl; Gabapentin; and Percocet  Home Medications   Prior to Admission medications   Medication Sig Start Date End Date Taking? Authorizing Provider  hydrochlorothiazide (HYDRODIURIL) 25 MG tablet Take 1 tablet (25 mg total) by mouth daily. 03/06/16  Yes [name], NP  ibuprofen (ADVIL,MOTRIN) 200 MG tablet Take 800 mg by mouth every 4 (four) hours as needed.    Yes Historical Provider, MD   BP 122/80 mmHg  Pulse 77  Temp(Src) 97.8 F (36.6 C) (Oral)  Resp 18  SpO2 96% Physical Exam  Constitutional: She is oriented to person, place, and time. She appears well-developed and well-nourished. No distress.  HENT:  Head: Normocephalic and atraumatic.  Eyes: Conjunctivae and EOM are normal. Pupils are equal, round, and reactive to light.  Neck: Normal range of motion.  Neck supple.  Cardiovascular: Normal rate, regular rhythm and normal heart sounds.   Pulmonary/Chest: Effort normal and breath sounds normal. No respiratory distress.  Abdominal: Soft. Bowel sounds are normal. There is no tenderness.  Musculoskeletal: Normal range of motion.  Neurological: She is alert and oriented to person, place, and time. She has normal strength and normal reflexes. No cranial nerve deficit or sensory deficit. She displays a negative Romberg sign.  Normal finger to nose  bilaterally.   No pronator drift bilaterally.    Skin: Skin is warm and dry. She is not diaphoretic.  Psychiatric: She has a normal mood and affect.     ED Course  Procedures (including critical care time) Labs Review Labs Reviewed  CBC - Abnormal; Notable for the following:    WBC 15.7 (*)    All other components within normal limits  DIFFERENTIAL - Abnormal; Notable for the following:    Neutro Abs 10.8 (*)    All other components within normal limits  COMPREHENSIVE METABOLIC PANEL - Abnormal; Notable for the following:    Sodium 131 (*)    Chloride 98 (*)    Glucose, Bld 115 (*)    BUN 21 (*)    All other components within normal limits  I-STAT CHEM 8, ED - Abnormal; Notable for the following:    Sodium 134 (*)    Chloride 98 (*)    BUN 23 (*)    Creatinine, Ser 1.10 (*)    Glucose, Bld 113 (*)    Hemoglobin 15.3 (*)    All other components within normal limits  PROTIME-INR  APTT  I-STAT TROPOININ, ED  CBG MONITORING, ED    Imaging Review Ct Head Wo Contrast  03/22/2016  CLINICAL DATA:  Right-sided numbness, 3-4 weeks duration. Symptoms intermittent up becoming more frequent. EXAM: CT HEAD WITHOUT CONTRAST TECHNIQUE: Contiguous axial images were obtained from the base of the skull through the vertex without intravenous contrast. COMPARISON:  04/03/2015 FINDINGS: The brain has a normal appearance without evidence of malformation, atrophy, old or acute infarction, mass lesion, hemorrhage, hydrocephalus or extra-axial collection. The calvarium is unremarkable. The paranasal sinuses, middle ears and mastoids are clear. IMPRESSION: Normal head CT Electronically Signed   By: Nelson Chimes M.D.   On: 03/22/2016 19:32   I have personally reviewed and evaluated these images and lab results as part of my medical decision-making.   EKG Interpretation   Date/Time:  Thursday March 22 2016 18:44:38 EDT Ventricular Rate:  108 PR Interval:  152 QRS Duration: 92 QT Interval:   320 QTC Calculation: 428 R Axis:   64 Text Interpretation:  Sinus tachycardia Possible Left atrial enlargement  Nonspecific ST and T wave abnormality Abnormal ECG Since previous tracing  ST changes are new Confirmed by Canary Brim  MD, MARTHA [phone]) on 03/22/2016  8:48:59 PM      MDM   Final diagnoses:  Numbness of face  Secondary hypertension, unspecified    JOEANNA PFANNENSTIEL is a 55 yo female presenting for intermittent HA associated with R sided facial numbness and facial cramping with no weakness, slurred speech, dysphagia or visual abnormalities.  Reports this began after starting lisinopril several weeks ago but switched to HCTZ.  On exam pt HDS in NAD.  Neuro exam WNL.  Hypertensive and attempted giving additional dose of home meds but pt refused.  Ddx, TIA, stroke, electrolyte abnormality, hypertensive urgency.  Hx not consistent with TIA/stroke and denies any facial drooping during these episodes.  Associated HA  indicates possible migraine as etiology and given migraine cocktail with plans to reassess the pt.  CBC, CMP and CT head unremarkable.  HA's possibly associated with HTN or hx of migraines.  Low suspicion for acute life threatening etiology.   Prior to reevaluation or discussion of follow up with neurology pt signed out AMA.  I was unable to discuss any risks and benefits of staying and was not notified that pt was attempting to leave AMA.  Nursing reports they discussed risks and benefits with the pt with regards of leaving AMA prior to reevaluation.  Please see their documentation for further details of their conversation.   Labs were viewed by myself  incorporated into medical decision making.   Medical decision making supervised by my attending Dr. Canary Brim.   Geronimo Boot, MD PGY-3 Emergency Medicine     Geronimo Boot, MD 03/23/16 [place], MD 03/23/16 [phone]

## 2016-03-22 NOTE — ED Notes (Signed)
Spoke with MD Darl Householder about patient's symptoms

## 2016-03-22 NOTE — Discharge Instructions (Signed)
Hypertension Hypertension, commonly called high blood pressure, is when the force of blood pumping through your arteries is too strong. Your arteries are the blood vessels that carry blood from your heart throughout your body. A blood pressure reading consists of a higher number over a lower number, such as 110/72. The higher number (systolic) is the pressure inside your arteries when your heart pumps. The lower number (diastolic) is the pressure inside your arteries when your heart relaxes. Ideally you want your blood pressure below 120/80. Hypertension forces your heart to work harder to pump blood. Your arteries may become narrow or stiff. Having untreated or uncontrolled hypertension can cause heart attack, stroke, kidney disease, and other problems. RISK FACTORS Some risk factors for high blood pressure are controllable. Others are not.  Risk factors you cannot control include:   Race. You may be at higher risk if you are African American.  Age. Risk increases with age.  Gender. Men are at higher risk than women before age 45 years. After age 65, women are at higher risk than men. Risk factors you can control include:  Not getting enough exercise or physical activity.  Being overweight.  Getting too much fat, sugar, calories, or salt in your diet.  Drinking too much alcohol. SIGNS AND SYMPTOMS Hypertension does not usually cause signs or symptoms. Extremely high blood pressure (hypertensive crisis) may cause headache, anxiety, shortness of breath, and nosebleed. DIAGNOSIS To check if you have hypertension, your health care provider will measure your blood pressure while you are seated, with your arm held at the level of your heart. It should be measured at least twice using the same arm. Certain conditions can cause a difference in blood pressure between your right and left arms. A blood pressure reading that is higher than normal on one occasion does not mean that you need treatment. If  it is not clear whether you have high blood pressure, you may be asked to return on a different day to have your blood pressure checked again. Or, you may be asked to monitor your blood pressure at home for 1 or more weeks. TREATMENT Treating high blood pressure includes making lifestyle changes and possibly taking medicine. Living a healthy lifestyle can help lower high blood pressure. You may need to change some of your habits. Lifestyle changes may include:  Following the DASH diet. This diet is high in fruits, vegetables, and whole grains. It is low in salt, red meat, and added sugars.  Keep your sodium intake below 2,300 mg per day.  Getting at least 30-45 minutes of aerobic exercise at least 4 times per week.  Losing weight if necessary.  Not smoking.  Limiting alcoholic beverages.  Learning ways to reduce stress. Your health care provider may prescribe medicine if lifestyle changes are not enough to get your blood pressure under control, and if one of the following is true:  You are 18-59 years of age and your systolic blood pressure is above 140.  You are 60 years of age or older, and your systolic blood pressure is above 150.  Your diastolic blood pressure is above 90.  You have diabetes, and your systolic blood pressure is over 140 or your diastolic blood pressure is over 90.  You have kidney disease and your blood pressure is above 140/90.  You have heart disease and your blood pressure is above 140/90. Your personal target blood pressure may vary depending on your medical conditions, your age, and other factors. HOME CARE INSTRUCTIONS    Have your blood pressure rechecked as directed by your health care provider.   Take medicines only as directed by your health care provider. Follow the directions carefully. Blood pressure medicines must be taken as prescribed. The medicine does not work as well when you skip doses. Skipping doses also puts you at risk for  problems.  Do not smoke.   Monitor your blood pressure at home as directed by your health care provider. SEEK MEDICAL CARE IF:   You think you are having a reaction to medicines taken.  You have recurrent headaches or feel dizzy.  You have swelling in your ankles.  You have trouble with your vision. SEEK IMMEDIATE MEDICAL CARE IF:  You develop a severe headache or confusion.  You have unusual weakness, numbness, or feel faint.  You have severe chest or abdominal pain.  You vomit repeatedly.  You have trouble breathing. MAKE SURE YOU:   Understand these instructions.  Will watch your condition.  Will get help right away if you are not doing well or get worse.   This information is not intended to replace advice given to you by your health care provider. Make sure you discuss any questions you have with your health care provider.   Document Released: 09/03/2005 Document Revised: 01/18/2015 Document Reviewed: 06/26/2013 Elsevier Interactive Patient Education 2016 Elsevier Inc.  

## 2016-03-22 NOTE — ED Notes (Signed)
Per Pt, Pt started to have numbness, twitching, and muscle cramping in the right face starting four weeks ago. Pt reports being on Lisinopril and then switching medication with hopes of change in symptoms. Pt reports no changes and intermittent episodes of numbness to the lips with eye twitching and facial cramping. Complains of episodes of dysphagia. Pt has Hx of HTN, fibromyalgia.

## 2016-03-27 ENCOUNTER — Encounter: Payer: BLUE CROSS/BLUE SHIELD | Admitting: Internal Medicine

## 2016-03-27 ENCOUNTER — Other Ambulatory Visit: Payer: Self-pay | Admitting: Internal Medicine

## 2016-04-09 ENCOUNTER — Encounter: Payer: BLUE CROSS/BLUE SHIELD | Admitting: Internal Medicine

## 2016-04-20 ENCOUNTER — Ambulatory Visit (INDEPENDENT_AMBULATORY_CARE_PROVIDER_SITE_OTHER): Payer: BLUE CROSS/BLUE SHIELD | Admitting: Internal Medicine

## 2016-04-20 ENCOUNTER — Encounter: Payer: Self-pay | Admitting: Internal Medicine

## 2016-04-20 VITALS — BP 132/92 | HR 94 | Temp 98.1°F | Ht 64.0 in | Wt 221.0 lb

## 2016-04-20 DIAGNOSIS — Z1159 Encounter for screening for other viral diseases: Secondary | ICD-10-CM | POA: Diagnosis not present

## 2016-04-20 DIAGNOSIS — F411 Generalized anxiety disorder: Secondary | ICD-10-CM | POA: Insufficient documentation

## 2016-04-20 DIAGNOSIS — R202 Paresthesia of skin: Secondary | ICD-10-CM | POA: Diagnosis not present

## 2016-04-20 DIAGNOSIS — M79609 Pain in unspecified limb: Secondary | ICD-10-CM

## 2016-04-20 DIAGNOSIS — R200 Anesthesia of skin: Secondary | ICD-10-CM | POA: Diagnosis not present

## 2016-04-20 DIAGNOSIS — I10 Essential (primary) hypertension: Secondary | ICD-10-CM

## 2016-04-20 DIAGNOSIS — F419 Anxiety disorder, unspecified: Secondary | ICD-10-CM | POA: Insufficient documentation

## 2016-04-20 DIAGNOSIS — M797 Fibromyalgia: Secondary | ICD-10-CM

## 2016-04-20 DIAGNOSIS — M1990 Unspecified osteoarthritis, unspecified site: Secondary | ICD-10-CM | POA: Diagnosis not present

## 2016-04-20 DIAGNOSIS — Z114 Encounter for screening for human immunodeficiency virus [HIV]: Secondary | ICD-10-CM | POA: Diagnosis not present

## 2016-04-20 DIAGNOSIS — F32A Depression, unspecified: Secondary | ICD-10-CM | POA: Insufficient documentation

## 2016-04-20 DIAGNOSIS — F329 Major depressive disorder, single episode, unspecified: Secondary | ICD-10-CM

## 2016-04-20 DIAGNOSIS — Z0001 Encounter for general adult medical examination with abnormal findings: Secondary | ICD-10-CM

## 2016-04-20 LAB — CBC
HCT: 41.2 % (ref 36.0–46.0)
HEMOGLOBIN: 14.1 g/dL (ref 12.0–15.0)
MCHC: 34.2 g/dL (ref 30.0–36.0)
MCV: 87.9 fl (ref 78.0–100.0)
PLATELETS: 294 10*3/uL (ref 150.0–400.0)
RBC: 4.69 Mil/uL (ref 3.87–5.11)
RDW: 12.8 % (ref 11.5–15.5)
WBC: 12 10*3/uL — AB (ref 4.0–10.5)

## 2016-04-20 LAB — COMPREHENSIVE METABOLIC PANEL
ALBUMIN: 4.3 g/dL (ref 3.5–5.2)
ALK PHOS: 57 U/L (ref 39–117)
ALT: 19 U/L (ref 0–35)
AST: 22 U/L (ref 0–37)
BILIRUBIN TOTAL: 0.4 mg/dL (ref 0.2–1.2)
BUN: 12 mg/dL (ref 6–23)
CALCIUM: 9.6 mg/dL (ref 8.4–10.5)
CO2: 27 mEq/L (ref 19–32)
CREATININE: 1.2 mg/dL (ref 0.40–1.20)
Chloride: 99 mEq/L (ref 96–112)
GFR: 50.02 mL/min — AB (ref >60.00)
Glucose, Bld: 98 mg/dL (ref 70–99)
Potassium: 3.8 mEq/L (ref 3.5–5.1)
Sodium: 135 mEq/L (ref 135–145)
TOTAL PROTEIN: 7.5 g/dL (ref 6.0–8.3)

## 2016-04-20 LAB — LIPID PANEL
CHOLESTEROL: 172 mg/dL (ref 0–200)
HDL: 59.1 mg/dL (ref >39.00)
LDL Cholesterol: 88 mg/dL (ref 0–99)
NonHDL: 113.28
TRIGLYCERIDES: 127 mg/dL (ref 0.0–149.0)
Total CHOL/HDL Ratio: 3
VLDL: 25.4 mg/dL (ref 0.0–40.0)

## 2016-04-20 LAB — TSH: TSH: 1.32 u[IU]/mL (ref 0.35–4.50)

## 2016-04-20 LAB — HEMOGLOBIN A1C: HEMOGLOBIN A1C: 5.6 % (ref 4.6–6.5)

## 2016-04-20 LAB — FOLATE: Folate: 11.7 ng/mL (ref >5.9)

## 2016-04-20 LAB — VITAMIN B12: VITAMIN B 12: 337 pg/mL (ref 211–911)

## 2016-04-20 LAB — VITAMIN D 25 HYDROXY (VIT D DEFICIENCY, FRACTURES): VITD: 28.96 ng/mL — AB (ref 30.00–100.00)

## 2016-04-20 NOTE — Assessment & Plan Note (Signed)
Chronic but stable off meds Will monitor 

## 2016-04-20 NOTE — Progress Notes (Signed)
HPI  Pt presents to the clinic today for her annual exam.  Fibromyalgia: Diagnosed 10 years ago. She takes Ibuprofen 800 mg every 4 hours with some relief. She did see a rheumatologist in the past for this but is not currently seeing one. She did take Lyrica in the past but it made her feel like she was detached. She has been on Cymbalta and Tramadol in the past but reports it did not help.    Depression: Chronic, but controlled off meds. Her depression is triggered by family situations. She has been treated with antidepressants in the past 4-5 different ones in the past, she can not remember the names, but they gave her headache. She has seing a therapist in the past but does not feel like it was beneficial.  Arthritis: She has it in her neck, shoulders and hands. She takes Ibuprofen daily for this and reports some relief on most days.   HTN: BP is 132/92 today. She is taking HCTZ as prescribed. She was taking Lisinopril but stopped because she starting feeling tightening, numbness and "drawing up" on the right side of her face. This lasts 2-3 minutes and then eases off.  Flu: gets yearly through her employer  Tetanus: 2012 Pap Smear: 2011- normal Mammogram: 06/2015 Colon Screening: never Vision Screening: yearly Dentist: as needed  Diet: She eats limited meats. She does consume some fruits and veggies.  She drinks Diet Dr. Malachi Bonds throughout the day, and is trying to drink more water. Exercise: None  Past Medical History:  Diagnosis Date  . Arthritis   . Complication of anesthesia    DIFFICULTY WAKING UP , LAST SURGERY NECK 2012 WAS FINE PER PT HERE AT [facility]  . Depression   . Fibromyalgia   . Headache    HX MIGRAINES    . Heart murmur   . MVP (mitral valve prolapse)    AS INFANT  . Neck pain     Current Outpatient Prescriptions  Medication Sig Dispense Refill  . hydrochlorothiazide (HYDRODIURIL) 25 MG tablet TAKE 1 TABLET BY MOUTH EVERY DAY 30 tablet 0  . ibuprofen  (ADVIL,MOTRIN) 200 MG tablet Take 800 mg by mouth every 4 (four) hours as needed.      No current facility-administered medications for this visit.     Allergies  Allergen Reactions  . Naproxen Palpitations  . Erythromycin Nausea And Vomiting  . Tramadol Hcl Other (See Comments)    Headaches  . Gabapentin   . Percocet [Oxycodone-Acetaminophen]     Family History  Problem Relation Age of Onset  . Adopted: Yes    Social History   Social History  . Marital status: Married    Spouse name: N/A  . Number of children: N/A  . Years of education: N/A   Occupational History  . Not on file.   Social History Main Topics  . Smoking status: Current Every Day Smoker    Packs/day: 0.25    Types: Cigarettes  . Smokeless tobacco: Former Systems developer  . Alcohol use No  . Drug use: No  . Sexual activity: Yes    Birth control/ protection: Post-menopausal   Other Topics Concern  . Not on file   Social History Narrative  . No narrative on file    ROS:  Constitutional: Pt reports weight gain. Denies fever, malaise, headache or faituge.  HEENT: Denies eye pain, eye redness, ear pain, ringing in the ears, wax buildup, runny nose, nasal congestion, bloody nose, or sore throat. Respiratory: Denies difficulty  breathing, shortness of breath, cough or sputum production.   Cardiovascular: Denies chest pain, chest tightness, palpitations or swelling in the hands or feet.  Gastrointestinal: Denies abdominal pain, bloating, constipation, diarrhea or blood in the stool.  GU: Denies frequency, urgency, pain with urination, blood in urine, odor or discharge. Musculoskeletal: Pt reports muscle and joint pain. Denies decrease in range of motion or joint swelling.  Skin: Denies redness, rashes, lesions or ulcercations.  Neurological: Pt reports numbness and tingling on the right side of her face and her right upper extremity. Denies dizziness, difficulty with memory, difficulty with speech or problems with  balance and coordination.  Psych: Pt reports depression. Denies anxiety, SI/HI.  No other specific complaints in a complete review of systems (except as listed in HPI above).  PE:  BP (!) 132/92   Pulse 94   Temp 98.1 F (36.7 C) (Oral)   Ht 5\' 4"  (1.626 m)   Wt 221 lb (100.2 kg)   SpO2 97%   BMI 37.93 kg/m  Wt Readings from Last 3 Encounters:  04/20/16 221 lb (100.2 kg)  03/06/16 218 lb (98.9 kg)  02/15/16 216 lb (98 kg)    General: Appears her stated age, obese in NAD. Skin: Warm, dry and intact.  HEENT: Head: normal shape and size; Eyes: sclera white, no icterus, conjunctiva pink, PERRLA and EOMs intact; Ears: Tm's gray and intact, normal light reflex; Throat/Mouth: Teeth present, mucosa pink and moist, no exudate, lesions or ulcerations noted.  Neck:  Neck supple, trachea midline. No masses, lumps or thyromegaly present.  Cardiovascular: Normal rate and rhythm. S1,S2 noted.  No murmur, rubs or gallops noted. No JVD or BLE edema.  Pulmonary/Chest: Normal effort and positive vesicular breath sounds. No respiratory distress. No wheezes, rales or ronchi noted.  Abdomen: Soft and nontender. Normal bowel sounds. No distention or masses noted. Liver, spleen and kidneys non palpable. Musculoskeletal: Strength 5/5 BUE/BLE. No signs of joint swelling. No difficulty with gait.  Neurological: Alert and oriented. Cranial nerves II-XII grossly intact. Sensation intact but different on the right side of her face and her right arm. Coordination normal.  Psychiatric: Mood and affect flat. Behavior is normal. Judgment and thought content normal.    BMET    Component Value Date/Time   NA 134 (L) 03/22/2016 1855   K 3.7 03/22/2016 1855   CL 98 (L) 03/22/2016 1855   CO2 23 03/22/2016 1847   GLUCOSE 113 (H) 03/22/2016 1855   BUN 23 (H) 03/22/2016 1855   CREATININE 1.10 (H) 03/22/2016 1855   CALCIUM 9.4 03/22/2016 1847   GFRNONAA >60 03/22/2016 1847   GFRAA >60 03/22/2016 1847    Lipid  Panel     Component Value Date/Time   CHOL 144 04/20/2009 0913   TRIG 78.0 04/20/2009 0913   HDL 44.50 04/20/2009 0913   CHOLHDL 3 04/20/2009 0913   VLDL 15.6 04/20/2009 0913   LDLCALC 84 04/20/2009 0913    CBC    Component Value Date/Time   WBC 15.7 (H) 03/22/2016 1847   RBC 4.86 03/22/2016 1847   HGB 15.3 (H) 03/22/2016 1855   HCT 45.0 03/22/2016 1855   PLT 303 03/22/2016 1847   MCV 87.9 03/22/2016 1847   MCH 30.5 03/22/2016 1847   MCHC 34.7 03/22/2016 1847   RDW 12.8 03/22/2016 1847   LYMPHSABS 3.9 03/22/2016 1847   MONOABS 0.8 03/22/2016 1847   EOSABS 0.2 03/22/2016 1847   BASOSABS 0.0 03/22/2016 1847    Hgb A1C Lab Results  Component Value Date   HGBA1C 5.7 01/04/2011     Assessment and Plan:  Encouraged her to get a flu shot in the fall Tetanus UTD Advised her to schedule her mammogram Pap smear due- advised her to schedule Referral to GI for colon screening Encouraged her to consume a balanced diet and exercise regimen Encouraged her to see an eye doctor and dentist annually  Paresthesia of face and right upper extremity:  Will check B12, Folate, TSH, Vit D, A1C May consider trying Prednisone If persist, may need xray/MRI of neck  RTC in 1 year, sooner if needed.  Webb Silversmith, NP                        Subjective:    Patient ID: Stacy [name], female    DOB: [birth year], 55 y.o.   MRN: [id number]  HPI Review of Systems Objective:   Physical Exam Assessment & Plan:

## 2016-04-20 NOTE — Progress Notes (Signed)
HPI  Pt presents to the clinic today for her annual exam.  Fibromyalgia: Diagnosed 10 years ago. She takes Ibuprofen 800 mg every 4 hours with some relief. She did see a rheumatologist in the past for this but is not currently seeing one. She did take Lyrica in the past but it made her feel like she was detached. She has been on Cymbalta and Tramadol in the past but reports it did not help.    Depression: Chronic, but controlled off meds. Her depression is triggered by family situations. She has been treated with antidepressants in the past 4-5 different ones in the past, she can not remember the names, but they gave her headache. She has seing a therapist in the past but does not feel like it was beneficial.  Arthritis: She has it in her neck, shoulders and hands. She takes Ibuprofen daily for this and reports some relief on most days.   HTN: BP is 132/92 today. She is taking HCTZ as prescribed. She was taking Lisinopril but stopped because she starting feeling tightening, numbness and "drawing up" on the right side of her face. This lasts 2-3 minutes and then eases off.  Flu: gets yearly through her employer  Tetanus: 2012 Pap Smear: 2011- normal Mammogram: 06/2015 Colon Screening: never Vision Screening: yearly Dentist: as needed  Diet: She eats limited meats. She does consume some fruits and veggies.  She drinks Diet Dr. Malachi Bonds throughout the day, and is trying to drink more water. Exercise: None  Past Medical History:  Diagnosis Date  . Arthritis   . Complication of anesthesia    DIFFICULTY WAKING UP , LAST SURGERY NECK 2012 WAS FINE PER PT HERE AT [facility]  . Depression   . Fibromyalgia   . Headache    HX MIGRAINES    . Heart murmur   . MVP (mitral valve prolapse)    AS INFANT  . Neck pain     Current Outpatient Prescriptions  Medication Sig Dispense Refill  . hydrochlorothiazide (HYDRODIURIL) 25 MG tablet TAKE 1 TABLET BY MOUTH EVERY DAY 30 tablet 0  . ibuprofen  (ADVIL,MOTRIN) 200 MG tablet Take 800 mg by mouth every 4 (four) hours as needed.      No current facility-administered medications for this visit.     Allergies  Allergen Reactions  . Naproxen Palpitations  . Erythromycin Nausea And Vomiting  . Tramadol Hcl Other (See Comments)    Headaches  . Gabapentin   . Percocet [Oxycodone-Acetaminophen]     Family History  Problem Relation Age of Onset  . Adopted: Yes    Social History   Social History  . Marital status: Married    Spouse name: N/A  . Number of children: N/A  . Years of education: N/A   Occupational History  . Not on file.   Social History Main Topics  . Smoking status: Current Every Day Smoker    Packs/day: 0.25    Types: Cigarettes  . Smokeless tobacco: Former Systems developer  . Alcohol use No  . Drug use: No  . Sexual activity: Yes    Birth control/ protection: Post-menopausal   Other Topics Concern  . Not on file   Social History Narrative  . No narrative on file    ROS:  Constitutional: Pt reports weight gain. Denies fever, malaise, headache or faituge.  HEENT: Denies eye pain, eye redness, ear pain, ringing in the ears, wax buildup, runny nose, nasal congestion, bloody nose, or sore throat. Respiratory: Denies difficulty  breathing, shortness of breath, cough or sputum production.   Cardiovascular: Denies chest pain, chest tightness, palpitations or swelling in the hands or feet.  Gastrointestinal: Denies abdominal pain, bloating, constipation, diarrhea or blood in the stool.  GU: Denies frequency, urgency, pain with urination, blood in urine, odor or discharge. Musculoskeletal: Pt reports muscle and joint pain. Denies decrease in range of motion or joint swelling.  Skin: Denies redness, rashes, lesions or ulcercations.  Neurological: Pt reports numbness and tingling on the right side of her face and her right upper extremity. Denies dizziness, difficulty with memory, difficulty with speech or problems with  balance and coordination.  Psych: Pt reports depression. Denies anxiety, SI/HI.  No other specific complaints in a complete review of systems (except as listed in HPI above).  PE:  BP (!) 132/92   Pulse 94   Temp 98.1 F (36.7 C) (Oral)   Ht 5\' 4"  (1.626 m)   Wt 221 lb (100.2 kg)   SpO2 97%   BMI 37.93 kg/m  Wt Readings from Last 3 Encounters:  04/20/16 221 lb (100.2 kg)  03/06/16 218 lb (98.9 kg)  02/15/16 216 lb (98 kg)    General: Appears her stated age, obese in NAD. Skin: Warm, dry and intact.  HEENT: Head: normal shape and size; Eyes: sclera white, no icterus, conjunctiva pink, PERRLA and EOMs intact; Ears: Tm's gray and intact, normal light reflex; Throat/Mouth: Teeth present, mucosa pink and moist, no exudate, lesions or ulcerations noted.  Neck:  Neck supple, trachea midline. No masses, lumps or thyromegaly present.  Cardiovascular: Normal rate and rhythm. S1,S2 noted.  No murmur, rubs or gallops noted. No JVD or BLE edema.  Pulmonary/Chest: Normal effort and positive vesicular breath sounds. No respiratory distress. No wheezes, rales or ronchi noted.  Abdomen: Soft and nontender. Normal bowel sounds. No distention or masses noted. Liver, spleen and kidneys non palpable. Musculoskeletal: Strength 5/5 BUE/BLE. No signs of joint swelling. No difficulty with gait.  Neurological: Alert and oriented. Cranial nerves II-XII grossly intact. Sensation intact but different on the right side of her face and her right arm. Coordination normal.  Psychiatric: Mood and affect flat. Behavior is normal. Judgment and thought content normal.    BMET    Component Value Date/Time   NA 134 (L) 03/22/2016 1855   K 3.7 03/22/2016 1855   CL 98 (L) 03/22/2016 1855   CO2 23 03/22/2016 1847   GLUCOSE 113 (H) 03/22/2016 1855   BUN 23 (H) 03/22/2016 1855   CREATININE 1.10 (H) 03/22/2016 1855   CALCIUM 9.4 03/22/2016 1847   GFRNONAA >60 03/22/2016 1847   GFRAA >60 03/22/2016 1847    Lipid  Panel     Component Value Date/Time   CHOL 144 04/20/2009 0913   TRIG 78.0 04/20/2009 0913   HDL 44.50 04/20/2009 0913   CHOLHDL 3 04/20/2009 0913   VLDL 15.6 04/20/2009 0913   LDLCALC 84 04/20/2009 0913    CBC    Component Value Date/Time   WBC 15.7 (H) 03/22/2016 1847   RBC 4.86 03/22/2016 1847   HGB 15.3 (H) 03/22/2016 1855   HCT 45.0 03/22/2016 1855   PLT 303 03/22/2016 1847   MCV 87.9 03/22/2016 1847   MCH 30.5 03/22/2016 1847   MCHC 34.7 03/22/2016 1847   RDW 12.8 03/22/2016 1847   LYMPHSABS 3.9 03/22/2016 1847   MONOABS 0.8 03/22/2016 1847   EOSABS 0.2 03/22/2016 1847   BASOSABS 0.0 03/22/2016 1847    Hgb A1C Lab Results  Component Value Date   HGBA1C 5.7 01/04/2011     Assessment and Plan:  Encouraged her to get a flu shot in the fall Tetanus UTD Advised her to schedule her mammogram Pap smear due- advised her to schedule Referral to GI for colon screening Encouraged her to consume a balanced diet and exercise regimen Encouraged her to see an eye doctor and dentist annually  Paresthesia of face and right upper extremity:  Will check B12, Folate, TSH, Vit D, A1C May consider trying Prednisone If persist, may need xray/MRI of neck  RTC in 1 year, sooner if needed.  Webb Silversmith, NP

## 2016-04-20 NOTE — Assessment & Plan Note (Signed)
Diastolic slightly elevated, but she reports she was rushing to get here Continue HCTZ

## 2016-04-20 NOTE — Assessment & Plan Note (Signed)
Continue Ibuprofen prn Encouraged weight loss

## 2016-04-20 NOTE — Progress Notes (Signed)
Pre visit review using our clinic review tool, if applicable. No additional management support is needed unless otherwise documented below in the visit note.      Flu--yearly...Marland KitchenTD--2012.Marland KitchenMarland KitchenPap--2011.Marland KitchenMarland KitchenMamm--05/2015.Marland KitchenMarland KitchenColon--never.Marland KitchenMarland KitchenVision--yearly... Dentist--PRN...Marland Kitchen

## 2016-04-20 NOTE — Assessment & Plan Note (Signed)
Continue Ibuprofen prn Encouraged physical activity

## 2016-04-20 NOTE — Patient Instructions (Signed)
Health Maintenance, Female Adopting a healthy lifestyle and getting preventive care can go a long way to promote health and wellness. Talk with your health care provider about what schedule of regular examinations is right for you. This is a good chance for you to check in with your provider about disease prevention and staying healthy. In between checkups, there are plenty of things you can do on your own. Experts have done a lot of research about which lifestyle changes and preventive measures are most likely to keep you healthy. Ask your health care provider for more information. WEIGHT AND DIET  Eat a healthy diet  Be sure to include plenty of vegetables, fruits, low-fat dairy products, and lean protein.  Do not eat a lot of foods high in solid fats, added sugars, or salt.  Get regular exercise. This is one of the most important things you can do for your health.  Most adults should exercise for at least 150 minutes each week. The exercise should increase your heart rate and make you sweat (moderate-intensity exercise).  Most adults should also do strengthening exercises at least twice a week. This is in addition to the moderate-intensity exercise.  Maintain a healthy weight  Body mass index (BMI) is a measurement that can be used to identify possible weight problems. It estimates body fat based on height and weight. Your health care provider can help determine your BMI and help you achieve or maintain a healthy weight.  For females 20 years of age and older:   A BMI below 18.5 is considered underweight.  A BMI of 18.5 to 24.9 is normal.  A BMI of 25 to 29.9 is considered overweight.  A BMI of 30 and above is considered obese.  Watch levels of cholesterol and blood lipids  You should start having your blood tested for lipids and cholesterol at 53 years of age, then have this test every 5 years.  You may need to have your cholesterol levels checked more often if:  Your lipid  or cholesterol levels are high.  You are older than 53 years of age.  You are at high risk for heart disease.  CANCER SCREENING   Lung Cancer  Lung cancer screening is recommended for adults 55-80 years old who are at high risk for lung cancer because of a history of smoking.  A yearly low-dose CT scan of the lungs is recommended for people who:  Currently smoke.  Have quit within the past 15 years.  Have at least a 30-pack-year history of smoking. A pack year is smoking an average of one pack of cigarettes a day for 1 year.  Yearly screening should continue until it has been 15 years since you quit.  Yearly screening should stop if you develop a health problem that would prevent you from having lung cancer treatment.  Breast Cancer  Practice breast self-awareness. This means understanding how your breasts normally appear and feel.  It also means doing regular breast self-exams. Let your health care provider know about any changes, no matter how small.  If you are in your 20s or 30s, you should have a clinical breast exam (CBE) by a health care provider every 1-3 years as part of a regular health exam.  If you are 40 or older, have a CBE every year. Also consider having a breast X-ray (mammogram) every year.  If you have a family history of breast cancer, talk to your health care provider about genetic screening.  If you   are at high risk for breast cancer, talk to your health care provider about having an MRI and a mammogram every year.  Breast cancer gene (BRCA) assessment is recommended for women who have family members with BRCA-related cancers. BRCA-related cancers include:  Breast.  Ovarian.  Tubal.  Peritoneal cancers.  Results of the assessment will determine the need for genetic counseling and BRCA1 and BRCA2 testing. Cervical Cancer Your health care provider may recommend that you be screened regularly for cancer of the pelvic organs (ovaries, uterus, and  vagina). This screening involves a pelvic examination, including checking for microscopic changes to the surface of your cervix (Pap test). You may be encouraged to have this screening done every 3 years, beginning at age 21.  For women ages 30-65, health care providers may recommend pelvic exams and Pap testing every 3 years, or they may recommend the Pap and pelvic exam, combined with testing for human papilloma virus (HPV), every 5 years. Some types of HPV increase your risk of cervical cancer. Testing for HPV may also be done on women of any age with unclear Pap test results.  Other health care providers may not recommend any screening for nonpregnant women who are considered low risk for pelvic cancer and who do not have symptoms. Ask your health care provider if a screening pelvic exam is right for you.  If you have had past treatment for cervical cancer or a condition that could lead to cancer, you need Pap tests and screening for cancer for at least 20 years after your treatment. If Pap tests have been discontinued, your risk factors (such as having a new sexual partner) need to be reassessed to determine if screening should resume. Some women have medical problems that increase the chance of getting cervical cancer. In these cases, your health care provider may recommend more frequent screening and Pap tests. Colorectal Cancer  This type of cancer can be detected and often prevented.  Routine colorectal cancer screening usually begins at 53 years of age and continues through 53 years of age.  Your health care provider may recommend screening at an earlier age if you have risk factors for colon cancer.  Your health care provider may also recommend using home test kits to check for hidden blood in the stool.  A small camera at the end of a tube can be used to examine your colon directly (sigmoidoscopy or colonoscopy). This is done to check for the earliest forms of colorectal  cancer.  Routine screening usually begins at age 50.  Direct examination of the colon should be repeated every 5-10 years through 53 years of age. However, you may need to be screened more often if early forms of precancerous polyps or small growths are found. Skin Cancer  Check your skin from head to toe regularly.  Tell your health care provider about any new moles or changes in moles, especially if there is a change in a mole's shape or color.  Also tell your health care provider if you have a mole that is larger than the size of a pencil eraser.  Always use sunscreen. Apply sunscreen liberally and repeatedly throughout the day.  Protect yourself by wearing long sleeves, pants, a wide-brimmed hat, and sunglasses whenever you are outside. HEART DISEASE, DIABETES, AND HIGH BLOOD PRESSURE   High blood pressure causes heart disease and increases the risk of stroke. High blood pressure is more likely to develop in:  People who have blood pressure in the high end   of the normal range (130-139/85-89 mm Hg).  People who are overweight or obese.  People who are African American.  If you are 38-23 years of age, have your blood pressure checked every 3-5 years. If you are 61 years of age or older, have your blood pressure checked every year. You should have your blood pressure measured twice--once when you are at a hospital or clinic, and once when you are not at a hospital or clinic. Record the average of the two measurements. To check your blood pressure when you are not at a hospital or clinic, you can use:  An automated blood pressure machine at a pharmacy.  A home blood pressure monitor.  If you are between 45 years and 39 years old, ask your health care provider if you should take aspirin to prevent strokes.  Have regular diabetes screenings. This involves taking a blood sample to check your fasting blood sugar level.  If you are at a normal weight and have a low risk for diabetes,  have this test once every three years after 53 years of age.  If you are overweight and have a high risk for diabetes, consider being tested at a younger age or more often. PREVENTING INFECTION  Hepatitis B  If you have a higher risk for hepatitis B, you should be screened for this virus. You are considered at high risk for hepatitis B if:  You were born in a country where hepatitis B is common. Ask your health care provider which countries are considered high risk.  Your parents were born in a high-risk country, and you have not been immunized against hepatitis B (hepatitis B vaccine).  You have HIV or AIDS.  You use needles to inject street drugs.  You live with someone who has hepatitis B.  You have had sex with someone who has hepatitis B.  You get hemodialysis treatment.  You take certain medicines for conditions, including cancer, organ transplantation, and autoimmune conditions. Hepatitis C  Blood testing is recommended for:  Everyone born from [birth year] through 1963.  Anyone with known risk factors for hepatitis C. Sexually transmitted infections (STIs)  You should be screened for sexually transmitted infections (STIs) including gonorrhea and chlamydia if:  You are sexually active and are younger than 53 years of age.  You are older than 53 years of age and your health care provider tells you that you are at risk for this type of infection.  Your sexual activity has changed since you were last screened and you are at an increased risk for chlamydia or gonorrhea. Ask your health care provider if you are at risk.  If you do not have HIV, but are at risk, it may be recommended that you take a prescription medicine daily to prevent HIV infection. This is called pre-exposure prophylaxis (PrEP). You are considered at risk if:  You are sexually active and do not regularly use condoms or know the HIV status of your partner(s).  You take drugs by injection.  You are sexually  active with a partner who has HIV. Talk with your health care provider about whether you are at high risk of being infected with HIV. If you choose to begin PrEP, you should first be tested for HIV. You should then be tested every 3 months for as long as you are taking PrEP.  PREGNANCY   If you are premenopausal and you may become pregnant, ask your health care provider about preconception counseling.  If you may  become pregnant, take 400 to 800 micrograms (mcg) of folic acid every day.  If you want to prevent pregnancy, talk to your health care provider about birth control (contraception). OSTEOPOROSIS AND MENOPAUSE   Osteoporosis is a disease in which the bones lose minerals and strength with aging. This can result in serious bone fractures. Your risk for osteoporosis can be identified using a bone density scan.  If you are 61 years of age or older, or if you are at risk for osteoporosis and fractures, ask your health care provider if you should be screened.  Ask your health care provider whether you should take a calcium or vitamin D supplement to lower your risk for osteoporosis.  Menopause may have certain physical symptoms and risks.  Hormone replacement therapy may reduce some of these symptoms and risks. Talk to your health care provider about whether hormone replacement therapy is right for you.  HOME CARE INSTRUCTIONS   Schedule regular health, dental, and eye exams.  Stay current with your immunizations.   Do not use any tobacco products including cigarettes, chewing tobacco, or electronic cigarettes.  If you are pregnant, do not drink alcohol.  If you are breastfeeding, limit how much and how often you drink alcohol.  Limit alcohol intake to no more than 1 drink per day for nonpregnant women. One drink equals 12 ounces of beer, 5 ounces of wine, or 1 ounces of hard liquor.  Do not use street drugs.  Do not share needles.  Ask your health care provider for help if  you need support or information about quitting drugs.  Tell your health care provider if you often feel depressed.  Tell your health care provider if you have ever been abused or do not feel safe at home.   This information is not intended to replace advice given to you by your health care provider. Make sure you discuss any questions you have with your health care provider.   Document Released: 03/19/2011 Document Revised: 09/24/2014 Document Reviewed: 08/05/2013 Elsevier Interactive Patient Education [employer].

## 2016-04-21 LAB — HIV ANTIBODY (ROUTINE TESTING W REFLEX): HIV 1&2 Ab, 4th Generation: NONREACTIVE

## 2016-04-21 LAB — HEPATITIS C ANTIBODY: HCV AB: NEGATIVE

## 2016-04-25 ENCOUNTER — Encounter: Payer: Self-pay | Admitting: Internal Medicine

## 2016-04-25 ENCOUNTER — Other Ambulatory Visit: Payer: Self-pay | Admitting: Internal Medicine

## 2016-04-25 DIAGNOSIS — M1990 Unspecified osteoarthritis, unspecified site: Secondary | ICD-10-CM

## 2016-04-26 ENCOUNTER — Other Ambulatory Visit: Payer: Self-pay | Admitting: Internal Medicine

## 2016-04-27 ENCOUNTER — Encounter: Payer: Self-pay | Admitting: Internal Medicine

## 2016-04-27 ENCOUNTER — Other Ambulatory Visit (HOSPITAL_COMMUNITY)
Admission: RE | Admit: 2016-04-27 | Discharge: 2016-04-27 | Disposition: A | Discharge status: Home / Self Care | Payer: BLUE CROSS/BLUE SHIELD | Source: Ambulatory Visit | Attending: Internal Medicine | Admitting: Internal Medicine

## 2016-04-27 ENCOUNTER — Ambulatory Visit (INDEPENDENT_AMBULATORY_CARE_PROVIDER_SITE_OTHER): Payer: BLUE CROSS/BLUE SHIELD | Admitting: Internal Medicine

## 2016-04-27 VITALS — BP 124/80 | HR 78 | Temp 98.2°F | Wt 219.0 lb

## 2016-04-27 DIAGNOSIS — Z01419 Encounter for gynecological examination (general) (routine) without abnormal findings: Secondary | ICD-10-CM

## 2016-04-27 DIAGNOSIS — Z124 Encounter for screening for malignant neoplasm of cervix: Secondary | ICD-10-CM | POA: Diagnosis not present

## 2016-04-27 DIAGNOSIS — Z113 Encounter for screening for infections with a predominantly sexual mode of transmission: Secondary | ICD-10-CM | POA: Diagnosis not present

## 2016-04-27 DIAGNOSIS — N760 Acute vaginitis: Secondary | ICD-10-CM | POA: Insufficient documentation

## 2016-04-27 DIAGNOSIS — Z1151 Encounter for screening for human papillomavirus (HPV): Secondary | ICD-10-CM | POA: Insufficient documentation

## 2016-04-27 NOTE — Patient Instructions (Signed)
Pap Test WHY AM I HAVING THIS TEST? A pap test is sometimes called a pap smear. It is a screening test that is used to check for signs of cancer of the vagina, cervix, and uterus. The test can also identify the presence of infection or precancerous changes. Your health care provider will likely recommend you have this test done on a regular basis. This test may be done:  Every 3 years, starting at age 53.  Every 5 years, in combination with testing for the presence of human papillomavirus (HPV).  More or less often depending on other medical conditions.  WHAT KIND OF SAMPLE IS TAKEN? Using a small cotton swab, plastic spatula, or brush, your health care provider will collect a sample of cells from the surface of your cervix. Your cervix is the opening to your uterus, also called a womb. Secretions from the cervix and vagina may also be collected. HOW DO I PREPARE FOR THE TEST?  Be aware of where you are in your menstrual cycle. You may be asked to reschedule the test if you are menstruating on the day of the test.  You may need to reschedule if you have a known vaginal infection on the day of the test.  You may be asked to avoid douching or taking a bath the day before or the day of the test.  Some medicines can cause abnormal test results, such as digitalis and tetracycline. Talk with your health care provider before your test if you take one of these medicines. WHAT DO THE RESULTS MEAN? Abnormal test results may indicate a number of health conditions. These may include:  Cancer. Although pap test results cannot be used to diagnose cancer of the cervix, vagina, or uterus, they may suggest the possibility of cancer. Further tests would be required to determine if cancer is present.  Sexually transmitted disease.  Fungal infection.  Parasite infection.  Herpes infection.  A condition causing or contributing to infertility. It is your responsibility to obtain your test results. Ask  the lab or department performing the test when and how you will get your results. Contact your health care provider to discuss any questions you have about your results.   This information is not intended to replace advice given to you by your health care provider. Make sure you discuss any questions you have with your health care provider.   Document Released: 11/24/2002 Document Revised: 09/24/2014 Document Reviewed: 01/25/2014 Elsevier Interactive Patient Education 2016 Elsevier Inc.  

## 2016-04-27 NOTE — Progress Notes (Signed)
Pre visit review using our clinic review tool, if applicable. No additional management support is needed unless otherwise documented below in the visit note. 

## 2016-04-27 NOTE — Progress Notes (Signed)
   Subjective:    Patient ID: Stacy [name], female    DOB: [birth year], 55 y.o.   MRN: [id number]  HPI  Pt presents to the clinic today for her pap smear. Her last pap smear was in 011-normal. Her last menstrual period was 01/27/2016. She denies vaginal complaints.  Review of Systems      Past Medical History:  Diagnosis Date  . Arthritis   . Complication of anesthesia    DIFFICULTY WAKING UP , LAST SURGERY NECK 2012 WAS FINE PER PT HERE AT [facility]  . Depression   . Fibromyalgia   . Headache    HX MIGRAINES    . Heart murmur   . MVP (mitral valve prolapse)    AS INFANT  . Neck pain     Current Outpatient Prescriptions  Medication Sig Dispense Refill  . hydrochlorothiazide (HYDRODIURIL) 25 MG tablet TAKE 1 TABLET BY MOUTH EVERY DAY 30 tablet 0  . ibuprofen (ADVIL,MOTRIN) 200 MG tablet Take 800 mg by mouth every 4 (four) hours as needed.      No current facility-administered medications for this visit.     Allergies  Allergen Reactions  . Naproxen Palpitations  . Erythromycin Nausea And Vomiting  . Tramadol Hcl Other (See Comments)    Headaches  . Gabapentin   . Percocet [Oxycodone-Acetaminophen]     Family History  Problem Relation Age of Onset  . Adopted: Yes    Social History   Social History  . Marital status: Married    Spouse name: N/A  . Number of children: N/A  . Years of education: N/A   Occupational History  . Not on file.   Social History Main Topics  . Smoking status: Current Every Day Smoker    Packs/day: 0.25    Types: Cigarettes  . Smokeless tobacco: Former Systems developer  . Alcohol use No  . Drug use: No  . Sexual activity: Yes    Birth control/ protection: Post-menopausal   Other Topics Concern  . Not on file   Social History Narrative  . No narrative on file     Constitutional: Denies fever, malaise, fatigue, headache or abrupt weight changes.  Gastrointestinal: Denies abdominal pain, bloating, constipation, diarrhea or blood in the  stool.  GU: Denies urgency, frequency, pain with urination, burning sensation, blood in urine, odor or discharge.   No other specific complaints in a complete review of systems (except as listed in HPI above).  Objective:   Physical Exam BP 124/80   Pulse 78   Temp 98.2 F (36.8 C) (Oral)   Wt 219 lb (99.3 kg)   LMP 01/27/2016   SpO2 97%   BMI 37.59 kg/m    Constitutional:  Alert, oriented x 4, well developed, well nourished in no apparent distress. Abdomel: Soft and nontender. Normal bowel sounds.  Genitourinary: Normal female anatomy. No CMT or discharge noted. Adenexa non palpable.  Neurological: Alert and oriented.  Psychiatric: She has a normal mood and affect. Behavior is normal. Judgment and thought content normal.       Assessment & Plan:   Routine gyn exam, screening for cervical cancer:  Pap today Will call you with results Anticipatory guidance given what to expect during menopause  RTC in 1 year, sooner if needed Webb Silversmith, NP

## 2016-05-01 LAB — CYTOLOGY - PAP

## 2016-05-02 ENCOUNTER — Other Ambulatory Visit: Payer: Self-pay | Admitting: Internal Medicine

## 2016-05-02 LAB — CERVICOVAGINAL ANCILLARY ONLY
Bacterial vaginitis: POSITIVE — AB
Candida vaginitis: NEGATIVE

## 2016-05-02 MED ORDER — METRONIDAZOLE 500 MG PO TABS: 500.0000 mg | ORAL_TABLET | Freq: Two times a day (BID) | ORAL | 0 refills | Status: DC

## 2016-05-08 ENCOUNTER — Encounter: Payer: Self-pay | Admitting: Gastroenterology

## 2016-05-10 DIAGNOSIS — I10 Essential (primary) hypertension: Secondary | ICD-10-CM | POA: Diagnosis not present

## 2016-05-31 ENCOUNTER — Encounter: Payer: Self-pay | Admitting: Internal Medicine

## 2016-06-06 DIAGNOSIS — J40 Bronchitis, not specified as acute or chronic: Secondary | ICD-10-CM | POA: Diagnosis not present

## 2016-06-08 DIAGNOSIS — M79642 Pain in left hand: Secondary | ICD-10-CM | POA: Diagnosis not present

## 2016-06-08 DIAGNOSIS — R5382 Chronic fatigue, unspecified: Secondary | ICD-10-CM | POA: Diagnosis not present

## 2016-06-08 DIAGNOSIS — M79672 Pain in left foot: Secondary | ICD-10-CM | POA: Diagnosis not present

## 2016-06-08 DIAGNOSIS — M79671 Pain in right foot: Secondary | ICD-10-CM | POA: Diagnosis not present

## 2016-06-08 DIAGNOSIS — M79641 Pain in right hand: Secondary | ICD-10-CM | POA: Diagnosis not present

## 2016-06-08 DIAGNOSIS — M791 Myalgia: Secondary | ICD-10-CM | POA: Diagnosis not present

## 2016-06-08 DIAGNOSIS — M2550 Pain in unspecified joint: Secondary | ICD-10-CM | POA: Diagnosis not present

## 2016-06-12 ENCOUNTER — Ambulatory Visit: Payer: Self-pay | Admitting: Internal Medicine

## 2016-06-25 ENCOUNTER — Ambulatory Visit (INDEPENDENT_AMBULATORY_CARE_PROVIDER_SITE_OTHER)
Admission: RE | Admit: 2016-06-25 | Discharge: 2016-06-25 | Disposition: A | Discharge status: Home / Self Care | Payer: BLUE CROSS/BLUE SHIELD | Source: Ambulatory Visit | Attending: Internal Medicine | Admitting: Internal Medicine

## 2016-06-25 ENCOUNTER — Encounter: Payer: Self-pay | Admitting: Internal Medicine

## 2016-06-25 ENCOUNTER — Ambulatory Visit (INDEPENDENT_AMBULATORY_CARE_PROVIDER_SITE_OTHER): Payer: BLUE CROSS/BLUE SHIELD | Admitting: Internal Medicine

## 2016-06-25 VITALS — BP 126/88 | HR 74 | Temp 98.1°F | Wt 222.2 lb

## 2016-06-25 DIAGNOSIS — M542 Cervicalgia: Secondary | ICD-10-CM

## 2016-06-25 DIAGNOSIS — G40409 Other generalized epilepsy and epileptic syndromes, not intractable, without status epilepticus: Secondary | ICD-10-CM

## 2016-06-25 NOTE — Progress Notes (Signed)
Subjective:    Patient ID: Stacy [name], female    DOB: 01-Aug-1961, 55 y.o.   MRN: [id number]  HPI  Pt presents to the clinic today with c/o the right side of her face and her right hand drawing up. This started months ago, but is becoming more frequent. She reports it occurs out of nowhere and resolves within seconds without intervention. She reports it is very uncomfortable. She denies numbness or tingling in her face or right hand. She reports she has numbness and tingling in her right foot, but has had this for a long time and is not sure that it is related. She has not taken anything OTC for this. She had multiple labs drawn at her visit in August 2017.  She also reports itchy eyes, runny nose, nasal congestion, ear fullness, and sore throat. This started 1 week ago. She is blowing clear mucous out of her nose. She denies decreased hearing. She denies difficulty swallowing. She denies fever, chills or body ache. She has tried Copywriter, advertising with minimal relief.  Review of Systems      Past Medical History:  Diagnosis Date  . Arthritis   . Complication of anesthesia    DIFFICULTY WAKING UP , LAST SURGERY NECK 2012 WAS FINE PER PT HERE AT [facility]  . Depression   . Fibromyalgia   . Headache    HX MIGRAINES    . Heart murmur   . MVP (mitral valve prolapse)    AS INFANT  . Neck pain     Current Outpatient Prescriptions  Medication Sig Dispense Refill  . Calcium Carbonate-Vitamin D3 (CALCIUM 600/VITAMIN D) 600-400 MG-UNIT TABS Take 1 tablet by mouth daily.    . hydrochlorothiazide (HYDRODIURIL) 25 MG tablet TAKE 1 TABLET BY MOUTH EVERY DAY 30 tablet 5  . ibuprofen (ADVIL,MOTRIN) 200 MG tablet Take 800 mg by mouth every 4 (four) hours as needed.      No current facility-administered medications for this visit.     Allergies  Allergen Reactions  . Naproxen Palpitations  . Erythromycin Nausea And Vomiting  . Tramadol Hcl Other (See Comments)    Headaches  . Gabapentin   .  Percocet [Oxycodone-Acetaminophen]     Family History  Problem Relation Age of Onset  . Adopted: Yes    Social History   Social History  . Marital status: Married    Spouse name: N/A  . Number of children: N/A  . Years of education: N/A   Occupational History  . Not on file.   Social History Main Topics  . Smoking status: Current Every Day Smoker    Packs/day: 0.25    Types: Cigarettes  . Smokeless tobacco: Former Systems developer  . Alcohol use No  . Drug use: No  . Sexual activity: Yes    Birth control/ protection: Post-menopausal   Other Topics Concern  . Not on file   Social History Narrative  . No narrative on file     Constitutional: Pt reports headache. Denies fever, malaise, fatigue, or abrupt weight changes.  HEENT: Pt reports runny nose, nasal congestion, ear pain or sore throat. Denies eye pain, eye redness, ringing in the ears, wax buildup, bloody nose. Respiratory: Denies difficulty breathing, shortness of breath, cough or sputum production.   Cardiovascular: Denies chest pain, chest tightness, palpitations or swelling in the hands or feet.  Musculoskeletal: Pt reports contraction of her face and right hand. Denies decrease in range of motion, difficulty with gait, or joint pain  and swelling.  Skin: Denies redness, rashes, lesions or ulcercations.  Neurological: Pt reports numbness and tingling in right foot. Denies dizziness, difficulty with memory, difficulty with speech or problems with balance and coordination.    No other specific complaints in a complete review of systems (except as listed in HPI above).  Objective:   Physical Exam   BP 126/88   Pulse 74   Temp 98.1 F (36.7 C) (Oral)   Wt 222 lb 4 oz (100.8 kg)   SpO2 98%   BMI 38.15 kg/m  Wt Readings from Last 3 Encounters:  06/25/16 222 lb 4 oz (100.8 kg)  04/27/16 219 lb (99.3 kg)  04/20/16 221 lb (100.2 kg)    General: Appears her stated age, obese in NAD. Skin: Warm, dry and intact. No  rashes, lesions or ulcerations noted. HEENT: Head: normal shape and size; Eyes: sclera white, no icterus, conjunctiva pink, PERRLA and EOMs intact; Ears: Tm's gray and intact, normal light reflex;  Nose: mucosa boggy and moist. Throat: + PND. Cardiovascular: Normal rate and rhythm. S1,S2 noted.  No murmur, rubs or gallops noted.  Pulmonary/Chest: Normal effort and positive vesicular breath sounds. No respiratory distress. No wheezes, rales or ronchi noted.  Musculoskeletal: Normal flexion, extension and rotation of the cervical spine. Bony tenderness noted over the cervical spine. Neurological: Alert and oriented. Sensation intact. Coordination normal.    BMET    Component Value Date/Time   NA 135 04/20/2016 1417   K 3.8 04/20/2016 1417   CL 99 04/20/2016 1417   CO2 27 04/20/2016 1417   GLUCOSE 98 04/20/2016 1417   BUN 12 04/20/2016 1417   CREATININE 1.20 04/20/2016 1417   CALCIUM 9.6 04/20/2016 1417   GFRNONAA >60 03/22/2016 1847   GFRAA >60 03/22/2016 1847    Lipid Panel     Component Value Date/Time   CHOL 172 04/20/2016 1417   TRIG 127.0 04/20/2016 1417   HDL 59.10 04/20/2016 1417   CHOLHDL 3 04/20/2016 1417   VLDL 25.4 04/20/2016 1417   LDLCALC 88 04/20/2016 1417    CBC    Component Value Date/Time   WBC 12.0 (H) 04/20/2016 1417   RBC 4.69 04/20/2016 1417   HGB 14.1 04/20/2016 1417   HCT 41.2 04/20/2016 1417   PLT 294.0 04/20/2016 1417   MCV 87.9 04/20/2016 1417   MCH 30.5 03/22/2016 1847   MCHC 34.2 04/20/2016 1417   RDW 12.8 04/20/2016 1417   LYMPHSABS 3.9 03/22/2016 1847   MONOABS 0.8 03/22/2016 1847   EOSABS 0.2 03/22/2016 1847   BASOSABS 0.0 03/22/2016 1847    Hgb A1C Lab Results  Component Value Date   HGBA1C 5.6 04/20/2016           Assessment & Plan:   Abnormal contraction of face muscle, right hand muscle:  Will check xray of cervical spine Consider CT/MRI brain vs referral to neuromuscular specialist  Allergic Rhinitis:  80 mg  Depo IM today Start Flonase and Zyrtec OTC  Will follow up after xray, RTC as needed Webb Silversmith, NP

## 2016-06-25 NOTE — Patient Instructions (Signed)
Allergic Rhinitis Allergic rhinitis is when the mucous membranes in the nose respond to allergens. Allergens are particles in the air that cause your body to have an allergic reaction. This causes you to release allergic antibodies. Through a chain of events, these eventually cause you to release histamine into the blood stream. Although meant to protect the body, it is this release of histamine that causes your discomfort, such as frequent sneezing, congestion, and an itchy, runny nose.  CAUSES Seasonal allergic rhinitis (hay fever) is caused by pollen allergens that may come from grasses, trees, and weeds. Year-round allergic rhinitis (perennial allergic rhinitis) is caused by allergens such as house dust mites, pet dander, and mold spores. SYMPTOMS  Nasal stuffiness (congestion).  Itchy, runny nose with sneezing and tearing of the eyes. DIAGNOSIS Your health care provider can help you determine the allergen or allergens that trigger your symptoms. If you and your health care provider are unable to determine the allergen, skin or blood testing may be used. Your health care provider will diagnose your condition after taking your health history and performing a physical exam. Your health care provider may assess you for other related conditions, such as asthma, pink eye, or an ear infection. TREATMENT Allergic rhinitis does not have a cure, but it can be controlled by:  Medicines that block allergy symptoms. These may include allergy shots, nasal sprays, and oral antihistamines.  Avoiding the allergen. Hay fever may often be treated with antihistamines in pill or nasal spray forms. Antihistamines block the effects of histamine. There are over-the-counter medicines that may help with nasal congestion and swelling around the eyes. Check with your health care provider before taking or giving this medicine. If avoiding the allergen or the medicine prescribed do not work, there are many new medicines  your health care provider can prescribe. Stronger medicine may be used if initial measures are ineffective. Desensitizing injections can be used if medicine and avoidance does not work. Desensitization is when a patient is given ongoing shots until the body becomes less sensitive to the allergen. Make sure you follow up with your health care provider if problems continue. HOME CARE INSTRUCTIONS It is not possible to completely avoid allergens, but you can reduce your symptoms by taking steps to limit your exposure to them. It helps to know exactly what you are allergic to so that you can avoid your specific triggers. SEEK MEDICAL CARE IF:  You have a fever.  You develop a cough that does not stop easily (persistent).  You have shortness of breath.  You start wheezing.  Symptoms interfere with normal daily activities.   This information is not intended to replace advice given to you by your health care provider. Make sure you discuss any questions you have with your health care provider.   Document Released: 05/29/2001 Document Revised: 09/24/2014 Document Reviewed: 05/11/2013 Elsevier Interactive Patient Education 2016 Elsevier Inc.  

## 2016-06-27 MED ORDER — METHYLPREDNISOLONE ACETATE 80 MG/ML IJ SUSP
80.0000 mg | Freq: Once | INTRAMUSCULAR | Status: AC
  Administered 2016-06-25: 80 mg via INTRAMUSCULAR

## 2016-06-27 NOTE — Addendum Note (Signed)
Addended by: Lurlean Nanny on: 06/27/2016 09:15 AM   Modules accepted: Orders

## 2016-06-28 ENCOUNTER — Encounter: Payer: Self-pay | Admitting: Internal Medicine

## 2016-06-28 ENCOUNTER — Telehealth: Payer: Self-pay | Admitting: Internal Medicine

## 2016-06-28 MED ORDER — ALPRAZOLAM 0.5 MG PO TABS: 0.5000 mg | ORAL_TABLET | Freq: Every evening | ORAL | 0 refills | Status: DC | PRN

## 2016-06-28 NOTE — Telephone Encounter (Signed)
Pt reports her son "OD'd" this morning and she just needs something to help for a matter of days not long term

## 2016-06-28 NOTE — Telephone Encounter (Signed)
Rx called in to pharmacy. 

## 2016-06-28 NOTE — Telephone Encounter (Signed)
Xanax 0.5 mg BID prn, # 10, 0 refill

## 2016-06-28 NOTE — Addendum Note (Signed)
Addended by: Lurlean Nanny on: 06/28/2016 05:22 PM   Modules accepted: Orders

## 2016-06-28 NOTE — Telephone Encounter (Signed)
Mel,  Call you call and get more info on this family tragedy. I am not trying to be nosy but I need to see if this issue will be short term vs long term and will cause issues with her nerve daily or sporadically so that I can decide which type of medication to send her in.

## 2016-06-28 NOTE — Telephone Encounter (Signed)
Pt called requesting an RX for her nerves.  She said she just experience a family tragedy and needs something to help calm her down.  Please call her at [phone]

## 2016-07-02 ENCOUNTER — Other Ambulatory Visit: Payer: Self-pay | Admitting: Internal Medicine

## 2016-07-02 DIAGNOSIS — G5139 Clonic hemifacial spasm, unspecified: Secondary | ICD-10-CM

## 2016-07-04 ENCOUNTER — Other Ambulatory Visit: Payer: Self-pay | Admitting: Internal Medicine

## 2016-07-04 ENCOUNTER — Telehealth: Payer: Self-pay | Admitting: *Deleted

## 2016-07-04 ENCOUNTER — Ambulatory Visit (AMBULATORY_SURGERY_CENTER): Payer: Self-pay | Admitting: *Deleted

## 2016-07-04 VITALS — Ht 64.0 in | Wt 223.8 lb

## 2016-07-04 DIAGNOSIS — Z1231 Encounter for screening mammogram for malignant neoplasm of breast: Secondary | ICD-10-CM

## 2016-07-04 DIAGNOSIS — Z0000 Encounter for general adult medical examination without abnormal findings: Secondary | ICD-10-CM | POA: Diagnosis not present

## 2016-07-04 DIAGNOSIS — Z1211 Encounter for screening for malignant neoplasm of colon: Secondary | ICD-10-CM

## 2016-07-04 MED ORDER — NA SULFATE-K SULFATE-MG SULF 17.5-3.13-1.6 GM/177ML PO SOLN: 1.0000 | Freq: Once | ORAL | 0 refills | Status: AC

## 2016-07-04 NOTE — Progress Notes (Signed)
No allergies to eggs or soy. Hard to wake up after tubal ligation 1988; surgeries since then; no problem with anesthesia  Pt not given Emmi instructions for colonoscopy  No oxygen use  No diet drug use

## 2016-07-04 NOTE — Telephone Encounter (Signed)
If there is a question regarding her recently having a seizure, then yes this needs to be evaluated prior to booking screening colonoscopy. Thanks for checking.

## 2016-07-04 NOTE — Telephone Encounter (Signed)
Dr Havery Moros: pt is scheduled for direct screening colonoscopy Wednesday 07/18/2016.  Please see OV with Webb Silversmith on 06/25/16.  Pt is being referred to neurologist for evaluation of possible seizure activity.  Pt does not have that appointment scheduled yet.  Is she ok for direct screening colonoscopy now or does she need to wait to scheduled after neurology evaluation.  Thanks, Juliann Pulse in [facility]

## 2016-07-05 ENCOUNTER — Encounter: Payer: Self-pay | Admitting: Gastroenterology

## 2016-07-06 NOTE — Telephone Encounter (Signed)
Pt called back and voiced understanding. Angela/PV

## 2016-07-06 NOTE — Telephone Encounter (Signed)
LMOM for pt to call us back. Angela/PV

## 2016-07-18 ENCOUNTER — Encounter: Payer: BLUE CROSS/BLUE SHIELD | Admitting: Gastroenterology

## 2016-07-30 ENCOUNTER — Telehealth: Payer: Self-pay | Admitting: Internal Medicine

## 2016-07-30 ENCOUNTER — Other Ambulatory Visit: Payer: Self-pay | Admitting: Internal Medicine

## 2016-07-30 DIAGNOSIS — G5139 Clonic hemifacial spasm, unspecified: Secondary | ICD-10-CM

## 2016-07-30 NOTE — Telephone Encounter (Signed)
New Referral sent to [facility], left message to patient that New Referral was completed and sent to [place] they will call her directly to schedule.

## 2016-07-30 NOTE — Telephone Encounter (Signed)
done

## 2016-07-30 NOTE — Telephone Encounter (Signed)
Patient called and wants a New Referral put in to see [facility]. She will be able to be seen a month earlier with their office. Please place New Neurology Referral ASAP so we can send it to [place].

## 2016-08-01 ENCOUNTER — Encounter: Payer: Self-pay | Admitting: Neurology

## 2016-08-01 ENCOUNTER — Ambulatory Visit (INDEPENDENT_AMBULATORY_CARE_PROVIDER_SITE_OTHER): Payer: BLUE CROSS/BLUE SHIELD | Admitting: Neurology

## 2016-08-01 VITALS — BP 128/86 | HR 74 | Resp 16 | Ht 64.0 in | Wt 222.0 lb

## 2016-08-01 DIAGNOSIS — G513 Clonic hemifacial spasm: Secondary | ICD-10-CM | POA: Diagnosis not present

## 2016-08-01 DIAGNOSIS — G5139 Clonic hemifacial spasm, unspecified: Secondary | ICD-10-CM | POA: Insufficient documentation

## 2016-08-01 DIAGNOSIS — M797 Fibromyalgia: Secondary | ICD-10-CM

## 2016-08-01 DIAGNOSIS — R404 Transient alteration of awareness: Secondary | ICD-10-CM | POA: Diagnosis not present

## 2016-08-01 DIAGNOSIS — G4700 Insomnia, unspecified: Secondary | ICD-10-CM | POA: Diagnosis not present

## 2016-08-01 DIAGNOSIS — R200 Anesthesia of skin: Secondary | ICD-10-CM | POA: Diagnosis not present

## 2016-08-01 MED ORDER — CYCLOBENZAPRINE HCL 5 MG PO TABS: 5.0000 mg | ORAL_TABLET | Freq: Every day | ORAL | 3 refills | Status: DC

## 2016-08-01 NOTE — Progress Notes (Signed)
[facility]  PATIENT: Stacy [name] DOB: 01-Aug-1961  REFERRING DOCTOR OR PCP:  Webb Silversmith SOURCE: patient, records from Ms. Baity, imaging and lab reports, CT scan on the PACS.  _________________________________   HISTORICAL  CHIEF COMPLAINT:  Chief Complaint  Patient presents with  . Numbness    Stacy [name] is here with her husband Liliane Channel for eval of episodes of numbness, tingling, spasms right side of her face. Sts. episodes occur 3-5 times per day, lasting up to 10 min. Sts. she feels lightheaded during episodes, remains aware of surroundings, no confusion, just feels "discombobulated."  Denies hx. of sz/fim    HISTORY OF PRESENT ILLNESS:  I had the pleasure seeing you patient, Stacy [name], at [facility] for neurologic consultation. She is a 55 year old woman who has had episodes of right facial spasming for the past 6 or 7 months. This began around April and initially she was only having occasional episodes. After a couple months the episodes became more frequent and would occur daily. Over the last 2 months they have occurred multiple times a day and currently are occurring about 5 times a day. A typical episode will last 5 minutes but they can last anywhere from 2-10 minutes. Before an episode she feels lightheadedness for a short period of time. Then she starts to feel numbness that starts around the lips on the right and spreads up to the rest of the face. Then, the face has tonic spasms with her right eye closed and the corner of the right side of her mouth up. This can be a little bit painful for her when it occurs. After a few minutes the spasm begins to improve and a couple minutes later the numbness gets better. During the episodes, she feels out of it/   She is still able to understand what everybody is saying and is able to get out of complete sentences, though her voice is changed because of the spasms. She may not feel completely back to  her baseline for a couple of hours after one of the episodes.  They have never been associated with loss of consciousness and she never gets symptoms in the right arm or leg or the other side of her body. She does not get incontinence or tongue biting associated with the episode.  She does not have a history of any significant head trauma. She did have significant degenerative changes in her cervical spine and had a C3-C4 fusion a few years ago. As a child, she had episodes of loss of consciousness and was found to have a mitral valve disorder and underwent valvuloplasty surgery at [facility].   She denies a history of meningitis or encephalitis.   She is adopted and does not know her family history. She has never had definite seizures in the past.  She notes insomnia. She will often wake up around 3 AM and then have difficulty falling back asleep. She has not noted any association with the frequency or severity of the episodes and the number of hours that she slept the night before.  She has fibromyalgia but is currently not on any treatment.    A review of her medications does not show anything that might lower the seizure threshold.  REVIEW OF SYSTEMS: Constitutional: No fevers, chills, sweats, or change in appetite.   She has insomnia and occasionally feel sleepy. Eyes: No visual changes, double vision, eye pain Ear, nose and throat: No hearing loss, ear pain, nasal congestion, sore throat  Cardiovascular: No chest pain, palpitations.   History of valve surgery as a child. Respiratory: No shortness of breath at rest or with exertion.   No wheezes GastrointestinaI: No nausea, vomiting, diarrhea, abdominal pain, fecal incontinence Genitourinary: No dysuria, urinary retention or frequency.  No nocturia. Musculoskeletal: She has generalized myalgia.  No neck pain, back pain Integumentary: No rash, pruritus, skin lesions Neurological: as above Psychiatric: No depression at this time.  No  anxiety Endocrine: No palpitations, diaphoresis, change in appetite, change in weigh or increased thirst Hematologic/Lymphatic: No anemia, purpura, petechiae. Allergic/Immunologic: No itchy/runny eyes, nasal congestion, recent allergic reactions, rashes  ALLERGIES: Allergies  Allergen Reactions  . Naproxen Palpitations  . Erythromycin Nausea And Vomiting  . Tramadol Hcl Other (See Comments)    Headaches  . Gabapentin Swelling    "makes me feel detached"  . Percocet [Oxycodone-Acetaminophen] Nausea Only    HOME MEDICATIONS:  Current Outpatient Prescriptions:  .  Calcium Carbonate-Vitamin D3 (CALCIUM 600/VITAMIN D) 600-400 MG-UNIT TABS, Take 1 tablet by mouth daily., Disp: , Rfl:  .  hydrochlorothiazide (HYDRODIURIL) 25 MG tablet, TAKE 1 TABLET BY MOUTH EVERY DAY, Disp: 30 tablet, Rfl: 5 .  ibuprofen (ADVIL,MOTRIN) 200 MG tablet, Take 800 mg by mouth every 4 (four) hours as needed. , Disp: , Rfl:   PAST MEDICAL HISTORY: Past Medical History:  Diagnosis Date  . Arthritis   . Complication of anesthesia    DIFFICULTY WAKING UP , LAST SURGERY NECK 2012 WAS FINE PER PT HERE AT [facility]  . Depression   . Fibromyalgia   . Headache    HX MIGRAINES    . Heart murmur   . Hypertension   . MVP (mitral valve prolapse)    AS INFANT  . Neck pain   . Vision abnormalities     PAST SURGICAL HISTORY: Past Surgical History:  Procedure Laterality Date  . cervical fusiion  2012  . MITRAL VALVE REPAIR     MVP 1970  . ROTATOR CUFF REPAIR Right 2012  . TOTAL HIP ARTHROPLASTY Right 09/14/2015   Procedure: RIGHT TOTAL HIP ARTHROPLASTY ANTERIOR APPROACH;  Surgeon: Leandrew Koyanagi, MD;  Location: [facility];  Service: Orthopedics;  Laterality: Right;  . TUBAL LIGATION  1988    FAMILY HISTORY: Family History  Problem Relation Age of Onset  . Adopted: Yes    SOCIAL HISTORY:  Social History   Social History  . Marital status: Married    Spouse name: N/A  . Number of children: N/A  . Years of  education: N/A   Occupational History  . Not on file.   Social History Main Topics  . Smoking status: Current Some Day Smoker    Packs/day: 0.25    Types: Cigarettes  . Smokeless tobacco: Never Used  . Alcohol use No  . Drug use: No  . Sexual activity: Yes    Birth control/ protection: Post-menopausal   Other Topics Concern  . Not on file   Social History Narrative  . No narrative on file     PHYSICAL EXAM  Vitals:   08/01/16 0914  BP: 128/86  Pulse: 74  Resp: 16  Weight: 222 lb (100.7 kg)  Height: 5\' 4"  (1.626 m)    Body mass index is 38.11 kg/m.   General: The patient is well-developed and well-nourished and in no acute distress  Eyes:  Funduscopic exam shows normal optic discs and retinal vessels.  Neck: The neck is supple, no carotid bruits are noted.  The neck is  nontender.  Cardiovascular: The heart has a regular rate and rhythm with a normal S1 and S2. There were no murmurs, gallops or rubs. Lungs are clear to auscultation.  Skin: Extremities are without significant edema.  Musculoskeletal:  Back is nontender  Neurologic Exam  Mental status: The patient is alert and oriented x 3 at the time of the examination. The patient has apparent normal recent and remote memory, with an apparently normal attention span and concentration ability.   Speech is normal.  Cranial nerves: Extraocular movements are full. Pupils are equal, round, and reactive to light and accomodation.  Visual fields are full.  Facial symmetry is present. There is good facial sensation to soft touch bilaterally.Facial strength is normal.  Trapezius and sternocleidomastoid strength is normal. No dysarthria is noted.  The tongue is midline, and the patient has symmetric elevation of the soft palate. No obvious hearing deficits are noted.  Motor:  Muscle bulk is normal.   Tone is normal. Strength is  5 / 5 in all 4 extremities.   Sensory: Sensory testing is intact to pinprick, soft touch and  vibration sensation in all 4 extremities.  Coordination: Cerebellar testing reveals good finger-nose-finger and heel-to-shin bilaterally.  Gait and station: Station is normal.   Gait is normal. Tandem gait is normal. Romberg is negative.   Reflexes: Deep tendon reflexes are symmetric and normal bilaterally.   Plantar responses are flexor.    DIAGNOSTIC DATA (LABS, IMAGING, TESTING) - I reviewed patient records, labs, notes, testing and imaging myself where available.  Lab Results  Component Value Date   WBC 12.0 (H) 04/20/2016   HGB 14.1 04/20/2016   HCT 41.2 04/20/2016   MCV 87.9 04/20/2016   PLT 294.0 04/20/2016      Component Value Date/Time   NA 135 04/20/2016 1417   K 3.8 04/20/2016 1417   CL 99 04/20/2016 1417   CO2 27 04/20/2016 1417   GLUCOSE 98 04/20/2016 1417   BUN 12 04/20/2016 1417   CREATININE 1.20 04/20/2016 1417   CALCIUM 9.6 04/20/2016 1417   PROT 7.5 04/20/2016 1417   ALBUMIN 4.3 04/20/2016 1417   AST 22 04/20/2016 1417   ALT 19 04/20/2016 1417   ALKPHOS 57 04/20/2016 1417   BILITOT 0.4 04/20/2016 1417   GFRNONAA >60 03/22/2016 1847   GFRAA >60 03/22/2016 1847   Lab Results  Component Value Date   CHOL 172 04/20/2016   HDL 59.10 04/20/2016   LDLCALC 88 04/20/2016   TRIG 127.0 04/20/2016   CHOLHDL 3 04/20/2016   Lab Results  Component Value Date   HGBA1C 5.6 04/20/2016   Lab Results  Component Value Date   VITAMINB12 337 04/20/2016   Lab Results  Component Value Date   TSH 1.32 04/20/2016       ASSESSMENT AND PLAN  Episodic altered awareness - Plan: MR BRAIN W WO CONTRAST, EEG adult  Facial spasm - Plan: MR BRAIN W WO CONTRAST  Right facial numbness - Plan: MR BRAIN W WO CONTRAST  Fibromyalgia  Insomnia, unspecified type   In summary, Deshaunda Hurrle is a 55 year old woman who has had an increasing number of episodes of right facial spasming, right facial numbness and alteration of awareness without loss of consciousness over  the past 7 months. The differential diagnosis includes simple partial seizure, facial nerve dysfunction or psychogenic.   We will check an EEG to determine if there any epileptiform discharges suggestive of simple partial seizure. We will check an MRI of the brain  to rule out a left frontoparietal mass lesion and distortion of the right facial nerve or adjacent brainstem.   If either of these is abnormal, appropriate treatment or referral will be made.   If both studies are normal, I would consider adding an antiepileptic agent as a negative EEG is not too specific.     She also has fibromyalgia and insomnia and I will place her on 5 mg cyclobenzaprine to help with her insomnia and hopefully to help with her pain.  She will return to see me in 6 weeks or sooner if there are new or worsening neurologic symptoms.  Need to see Mrs. Owens Shark for a neurologic consultation. Please let me know if I can be of further assistance with her or other patients in the future.   Lavergne Hiltunen A. Felecia Shelling, MD, PhD [id number], [id number] Certified in Neurology, Clinical Neurophysiology, Sleep Medicine, Pain Medicine and Neuroimaging  [facility] [address] [place], [place] [address] [phone]

## 2016-08-06 ENCOUNTER — Ambulatory Visit
Admission: RE | Admit: 2016-08-06 | Discharge: 2016-08-06 | Disposition: A | Discharge status: Home / Self Care | Payer: BLUE CROSS/BLUE SHIELD | Source: Ambulatory Visit | Attending: Internal Medicine | Admitting: Internal Medicine

## 2016-08-06 DIAGNOSIS — Z1231 Encounter for screening mammogram for malignant neoplasm of breast: Secondary | ICD-10-CM | POA: Diagnosis not present

## 2016-08-07 ENCOUNTER — Encounter: Payer: Self-pay | Admitting: Neurology

## 2016-08-08 ENCOUNTER — Ambulatory Visit (INDEPENDENT_AMBULATORY_CARE_PROVIDER_SITE_OTHER): Payer: BLUE CROSS/BLUE SHIELD | Admitting: Neurology

## 2016-08-08 DIAGNOSIS — G5139 Clonic hemifacial spasm, unspecified: Secondary | ICD-10-CM

## 2016-08-08 DIAGNOSIS — R252 Cramp and spasm: Secondary | ICD-10-CM

## 2016-08-08 DIAGNOSIS — R259 Unspecified abnormal involuntary movements: Secondary | ICD-10-CM | POA: Diagnosis not present

## 2016-08-08 DIAGNOSIS — R404 Transient alteration of awareness: Secondary | ICD-10-CM

## 2016-08-08 NOTE — Progress Notes (Signed)
   [facility]  EEG (ELECTROENCEPHALOGRAM) REPORT   STUDY DATE: 08/08/2016 PATIENT NAME: Stacy [name] DOB: May 25, 1961 MRN: [id number]  ORDERING CLINICIAN: Ragen Laver A. Felecia Shelling, MD. PhD  TECHNOLOGIST: Laretta Alstrom TECHNIQUE: Electroencephalogram was recorded utilizing standard 10-20 system of lead placement and reformatted into average and bipolar montages.  RECORDING TIME: 22 minutes ACTIVATION: photic stimulation and hyperventilation  CLINICAL INFORMATION: 55 year old woman with a sodium of altered consciousness and right facial spasms  FINDINGS:   There was a 10 Hz posterior dominant rhythm that reacted to eye opening and closing. There was intermixed activity more anteriorly. Voltages and frequencies were symmetric.  There were no seizures or spikes. However, there were some sharply contoured waves in the left temporal and parietal region.  Patient became drowsy but did not enter definitive sleep.   Photic stimulation normal driving response. Hyperventilation not change the underlying rhythms.  IMPRESSION: This EEG study by the patient was awake and drowsy shows did not show any seizures but did show left focal sharp waves.   These changes could be associated with a higher chance of seizures.      INTERPRETING PHYSICIAN:   Astoria Condon A. Felecia Shelling, MD, PhD Certified in Neurology, [place] [facility], Sleep Medicine, Pain Medicine and Neuroimaging  [facility] [address] [place], [place] [address] [phone]

## 2016-08-13 ENCOUNTER — Other Ambulatory Visit: Payer: Self-pay | Admitting: Internal Medicine

## 2016-08-13 DIAGNOSIS — R928 Other abnormal and inconclusive findings on diagnostic imaging of breast: Secondary | ICD-10-CM

## 2016-08-16 DIAGNOSIS — R404 Transient alteration of awareness: Secondary | ICD-10-CM

## 2016-08-17 DIAGNOSIS — I10 Essential (primary) hypertension: Secondary | ICD-10-CM | POA: Diagnosis not present

## 2016-08-17 DIAGNOSIS — Z760 Encounter for issue of repeat prescription: Secondary | ICD-10-CM | POA: Diagnosis not present

## 2016-08-20 ENCOUNTER — Ambulatory Visit
Admission: RE | Admit: 2016-08-20 | Discharge: 2016-08-20 | Disposition: A | Discharge status: Home / Self Care | Payer: BLUE CROSS/BLUE SHIELD | Source: Ambulatory Visit | Attending: Internal Medicine | Admitting: Internal Medicine

## 2016-08-20 ENCOUNTER — Other Ambulatory Visit: Payer: Self-pay | Admitting: Internal Medicine

## 2016-08-20 DIAGNOSIS — N6489 Other specified disorders of breast: Secondary | ICD-10-CM

## 2016-08-20 DIAGNOSIS — R928 Other abnormal and inconclusive findings on diagnostic imaging of breast: Secondary | ICD-10-CM

## 2016-08-20 DIAGNOSIS — N6321 Unspecified lump in the left breast, upper outer quadrant: Secondary | ICD-10-CM | POA: Diagnosis not present

## 2016-08-21 ENCOUNTER — Ambulatory Visit
Admission: RE | Admit: 2016-08-21 | Discharge: 2016-08-21 | Disposition: A | Discharge status: Home / Self Care | Payer: BLUE CROSS/BLUE SHIELD | Source: Ambulatory Visit | Attending: Internal Medicine | Admitting: Internal Medicine

## 2016-08-21 DIAGNOSIS — N6489 Other specified disorders of breast: Secondary | ICD-10-CM

## 2016-08-21 DIAGNOSIS — C50412 Malignant neoplasm of upper-outer quadrant of left female breast: Secondary | ICD-10-CM | POA: Diagnosis not present

## 2016-08-21 DIAGNOSIS — N6321 Unspecified lump in the left breast, upper outer quadrant: Secondary | ICD-10-CM | POA: Diagnosis not present

## 2016-08-22 ENCOUNTER — Ambulatory Visit (INDEPENDENT_AMBULATORY_CARE_PROVIDER_SITE_OTHER): Payer: Self-pay

## 2016-08-22 DIAGNOSIS — R404 Transient alteration of awareness: Secondary | ICD-10-CM

## 2016-08-22 DIAGNOSIS — Z0289 Encounter for other administrative examinations: Secondary | ICD-10-CM

## 2016-08-22 DIAGNOSIS — R200 Anesthesia of skin: Secondary | ICD-10-CM

## 2016-08-22 DIAGNOSIS — G5139 Clonic hemifacial spasm, unspecified: Secondary | ICD-10-CM

## 2016-08-23 ENCOUNTER — Telehealth: Payer: Self-pay | Admitting: *Deleted

## 2016-08-23 ENCOUNTER — Encounter: Payer: Self-pay | Admitting: *Deleted

## 2016-08-23 DIAGNOSIS — C50412 Malignant neoplasm of upper-outer quadrant of left female breast: Secondary | ICD-10-CM

## 2016-08-23 DIAGNOSIS — Z853 Personal history of malignant neoplasm of breast: Secondary | ICD-10-CM | POA: Insufficient documentation

## 2016-08-23 HISTORY — DX: Malignant neoplasm of upper-outer quadrant of left female breast: C50.412

## 2016-08-23 NOTE — Telephone Encounter (Signed)
Confirmed [facility] for 08/29/16 at 0815 .  Instructions and contact information given.  

## 2016-08-23 NOTE — Telephone Encounter (Signed)
Mailed [facility] packet to pt. 

## 2016-08-24 ENCOUNTER — Telehealth: Payer: Self-pay | Admitting: *Deleted

## 2016-08-24 NOTE — Telephone Encounter (Signed)
-----   Message from Britt Bottom, MD sent at 08/24/2016  8:27 AM EST ----- Please let her know that the MRI of the brain was normal. She does have some chronic sinusitis. If the sinusitis is symptomatic we can call in a Z-Pak.      If she is still having facial spasms lets's start Keppra500 mg by mouth twice a day.    #60   #5

## 2016-08-24 NOTE — Telephone Encounter (Signed)
LMTC./fim 

## 2016-08-27 ENCOUNTER — Telehealth: Payer: Self-pay | Admitting: *Deleted

## 2016-08-27 NOTE — Telephone Encounter (Signed)
-----   Message from Britt Bottom, MD sent at 08/24/2016  8:27 AM EST ----- Please let her know that the MRI of the brain was normal. She does have some chronic sinusitis. If the sinusitis is symptomatic we can call in a Z-Pak.      If she is still having facial spasms lets's start Keppra500 mg by mouth twice a day.    #60   #5

## 2016-08-27 NOTE — Telephone Encounter (Signed)
I received a call back from [place] while I was with another pt.  LMOM that per RAS, MRI brain is normal.  She has chronic sinusitis, so if she is symptomatic, per RAS, ok for Z-pk.  She should call back if she is still having facial spasms, and he will start her on Keppra/fim

## 2016-08-27 NOTE — Telephone Encounter (Signed)
LMTC for MRI results/fim 

## 2016-08-27 NOTE — Telephone Encounter (Signed)
Patient is returning your call for MRI results.

## 2016-08-27 NOTE — Telephone Encounter (Signed)
See result note/fim 

## 2016-08-29 ENCOUNTER — Other Ambulatory Visit (HOSPITAL_BASED_OUTPATIENT_CLINIC_OR_DEPARTMENT_OTHER): Payer: BLUE CROSS/BLUE SHIELD

## 2016-08-29 ENCOUNTER — Ambulatory Visit
Admission: RE | Admit: 2016-08-29 | Discharge: 2016-08-29 | Disposition: A | Discharge status: Home / Self Care | Payer: BLUE CROSS/BLUE SHIELD | Source: Ambulatory Visit | Attending: Radiation Oncology | Admitting: Radiation Oncology

## 2016-08-29 ENCOUNTER — Encounter: Payer: Self-pay | Admitting: *Deleted

## 2016-08-29 ENCOUNTER — Other Ambulatory Visit: Payer: Self-pay | Admitting: General Surgery

## 2016-08-29 ENCOUNTER — Encounter: Payer: Self-pay | Admitting: Hematology and Oncology

## 2016-08-29 ENCOUNTER — Ambulatory Visit (HOSPITAL_BASED_OUTPATIENT_CLINIC_OR_DEPARTMENT_OTHER): Payer: BLUE CROSS/BLUE SHIELD | Admitting: Hematology and Oncology

## 2016-08-29 DIAGNOSIS — C50412 Malignant neoplasm of upper-outer quadrant of left female breast: Secondary | ICD-10-CM

## 2016-08-29 DIAGNOSIS — Z170 Estrogen receptor positive status [ER+]: Principal | ICD-10-CM

## 2016-08-29 DIAGNOSIS — F17210 Nicotine dependence, cigarettes, uncomplicated: Secondary | ICD-10-CM | POA: Diagnosis not present

## 2016-08-29 LAB — COMPREHENSIVE METABOLIC PANEL
ALT: 21 U/L (ref 0–55)
AST: 19 U/L (ref 5–34)
Albumin: 3.8 g/dL (ref 3.5–5.0)
Alkaline Phosphatase: 72 U/L (ref 40–150)
Anion Gap: 10 mEq/L (ref 3–11)
BILIRUBIN TOTAL: 0.31 mg/dL (ref 0.20–1.20)
BUN: 18.6 mg/dL (ref 7.0–26.0)
CO2: 27 meq/L (ref 22–29)
Calcium: 10.1 mg/dL (ref 8.4–10.4)
Chloride: 105 mEq/L (ref 98–109)
Creatinine: 0.9 mg/dL (ref 0.6–1.1)
EGFR: 74 mL/min/{1.73_m2} — AB (ref >90)
GLUCOSE: 85 mg/dL (ref 70–140)
Potassium: 4.3 mEq/L (ref 3.5–5.1)
SODIUM: 141 meq/L (ref 136–145)
TOTAL PROTEIN: 7.6 g/dL (ref 6.4–8.3)

## 2016-08-29 LAB — CBC WITH DIFFERENTIAL/PLATELET
BASO%: 0.7 % (ref 0.0–2.0)
Basophils Absolute: 0.1 10*3/uL (ref 0.0–0.1)
EOS%: 2.5 % (ref 0.0–7.0)
Eosinophils Absolute: 0.3 10*3/uL (ref 0.0–0.5)
HCT: 44.7 % (ref 34.8–46.6)
HGB: 14.9 g/dL (ref 11.6–15.9)
LYMPH%: 21.9 % (ref 14.0–49.7)
MCH: 29.5 pg (ref 25.1–34.0)
MCHC: 33.4 g/dL (ref 31.5–36.0)
MCV: 88.5 fL (ref 79.5–101.0)
MONO#: 0.9 10*3/uL (ref 0.1–0.9)
MONO%: 8 % (ref 0.0–14.0)
NEUT%: 66.9 % (ref 38.4–76.8)
NEUTROS ABS: 7.8 10*3/uL — AB (ref 1.5–6.5)
Platelets: 272 10*3/uL (ref 145–400)
RBC: 5.05 10*6/uL (ref 3.70–5.45)
RDW: 13.3 % (ref 11.2–14.5)
WBC: 11.7 10*3/uL — AB (ref 3.9–10.3)
lymph#: 2.6 10*3/uL (ref 0.9–3.3)

## 2016-08-29 NOTE — Assessment & Plan Note (Addendum)
08/21/2016: Screening detected left breast mass UOQ at 1:00: 1.2 x 1 x 0.5 cm, axilla negative Left breast biopsy 1:00: IDC with DCIS, grade 1, ER 90%, PR 70%, Ki-67 30%, HER-2 positive ratio 2.43, T1c N0 stage IA clinical stage  Pathology and radiology counseling:Discussed with the patient, the details of pathology including the type of breast cancer,the clinical staging, the significance of ER, PR and HER-2/neu receptors and the implications for treatment. After reviewing the pathology in detail, we proceeded to discuss the different treatment options between surgery, radiation, chemotherapy, antiestrogen therapies.  Recommendations: 1. Breast conserving surgery followed by 2. adjuvant chemotherapy with Herceptin (discussed Taxol and Herceptin weekly 12 followed by Herceptin maintenance to complete 1 year) 3. Adjuvant radiation therapy followed by 4. Adjuvant antiestrogen therapy  I discussed with the patient that the adjuvant chemotherapy plan would be be discussed after her surgery depending on the final pathology report. I discussed risks and benefits of chemotherapy. Taxol-related allergy reactions were also discussed along with neuropathy, nausea, fatigue, hair loss, decreased blood counts. Herceptin related cardiac toxicities were also discussed. Patient would need an echocardiogram every 3 months for 1 year.  Return to clinic after surgery to discuss the final treatment plan

## 2016-08-29 NOTE — Progress Notes (Signed)
Radiation Oncology         [phone] ________________________________  Initial Outpatient Consultation  Name: Stacy [name] MRN: [id number]  Date: 08/29/2016  DOB: [birth year]  WN:IOEVO, REGINA, NP  [name], MD   REFERRING PHYSICIAN: [name], MD  DIAGNOSIS:    ICD-9-CM ICD-10-CM   1. Malignant neoplasm of upper-outer quadrant of left breast in female, estrogen receptor positive ([place]) 174.4 C50.412    V86.0 Z17.0    Clinical Stage 1A Left Breast UOQ Invasive Ductal Carcinoma, ER+ / PR+ / Her2+, Grade 1  CHIEF COMPLAINT: Here to discuss management of left breast cancer  HISTORY OF PRESENT ILLNESS::Stacy [name] is a 55 y.o. female who presented with possible distortions in the left breast on screening mammogram performed on 08/06/16. Ultrasound on 08/20/16 revealed small spiculated mass in the upper outer quadrant of the left breast.  Biopsy on 08/21/16 showed a mass at the 1 o'clock region of the left breast consistent with invasive ductal carcinoma in situ. Receptor status is ER 90%, PR 70%, Her2 +, and Ki67 30%. Patient had an unremarkable brain MRI on 08/23/16.  Patient is positive for weight change, loss of sleep, fatigue, pain, muscle ache, sinus problems, glasses, blurred vision, sleeping on 3 pillows, heart palpitations, urinary incontinence, back pain, joint pain, arthritis, headaches and anxiety.  PREVIOUS RADIATION THERAPY: No  PAST MEDICAL HISTORY:  has a past medical history of Anxiety; Arthritis; Complication of anesthesia; Depression; Fibromyalgia; Headache; Heart murmur; Hypertension; Malignant neoplasm of upper-outer quadrant of left female breast ([place]) (08/23/2016); MVP (mitral valve prolapse); Neck pain; and Vision abnormalities.    PAST SURGICAL HISTORY: Past Surgical History:  Procedure Laterality Date  . cervical fusiion  2012  . MITRAL VALVE REPAIR     MVP 1970  . ROTATOR CUFF REPAIR Right 2012  . TOTAL HIP ARTHROPLASTY Right 09/14/2015   Procedure: RIGHT TOTAL HIP ARTHROPLASTY ANTERIOR APPROACH;  Surgeon: Leandrew Koyanagi, MD;  Location: [facility];  Service: Orthopedics;  Laterality: Right;  . TUBAL LIGATION  1988    FAMILY HISTORY: family history is not on file. She was adopted.  SOCIAL HISTORY:  reports that she has been smoking Cigarettes.  She has been smoking about 0.25 packs per day. She has never used smokeless tobacco. She reports that she does not drink alcohol or use drugs.  ALLERGIES: Naproxen; Erythromycin; Tramadol hcl; Gabapentin; and Percocet [oxycodone-acetaminophen]  MEDICATIONS:  Current Outpatient Prescriptions  Medication Sig Dispense Refill  . Calcium Carbonate-Vitamin D3 (CALCIUM 600/VITAMIN D) 600-400 MG-UNIT TABS Take 1 tablet by mouth daily.    . cyclobenzaprine (FLEXERIL) 5 MG tablet Take 1 tablet (5 mg total) by mouth at bedtime. (Patient not taking: Reported on 08/29/2016) 30 tablet 3  . hydrochlorothiazide (HYDRODIURIL) 25 MG tablet TAKE 1 TABLET BY MOUTH EVERY DAY 30 tablet 5  . ibuprofen (ADVIL,MOTRIN) 200 MG tablet Take 800 mg by mouth every 4 (four) hours as needed.      No current facility-administered medications for this encounter.     REVIEW OF SYSTEMS: A 10+ POINT REVIEW OF SYSTEMS WAS OBTAINED including neurology, dermatology, psychiatry, cardiac, respiratory, lymph, extremities, GI, GU, Musculoskeletal, constitutional, breasts, reproductive, HEENT.  All pertinent positives are noted in the HPI.  All others are negative.   PHYSICAL EXAM:  vitals were not taken for this visit.  Vitals with BMI 08/29/2016  Height   Weight 225 lbs 10 oz  BMI   Systolic 350  Diastolic 87  Pulse 89  Respirations 18  General: Alert and oriented, in no acute distress HEENT: Head is normocephalic. Extraocular movements are intact. Oropharynx is clear. Neck: Neck is supple, no palpable cervical or supraclavicular lymphadenopathy. Heart: Regular in rate and rhythm with no murmurs, rubs, or gallops. Chest:  Clear to auscultation bilaterally, with no rhonchi, wheezes, or rales. Abdomen: Soft, nontender, nondistended, with no rigidity or guarding. Neurologic:  No obvious focalities. Speech is fluent. Coordination is intact. Psychiatric: Judgment and insight are intact. Affect is appropriate. Breasts: Some bruising in UOQ of left breast. No palpable masses appreciated in the breasts or axillae. Patient has a scar along the sternum from prior mitral valve surgery at age 88 or 87.    ECOG = 1   LABORATORY DATA:  Lab Results  Component Value Date   WBC 11.7 (H) 08/29/2016   HGB 14.9 08/29/2016   HCT 44.7 08/29/2016   MCV 88.5 08/29/2016   PLT 272 08/29/2016   CMP     Component Value Date/Time   NA 141 08/29/2016 0831   K 4.3 08/29/2016 0831   CL 99 04/20/2016 1417   CO2 27 08/29/2016 0831   GLUCOSE 85 08/29/2016 0831   BUN 18.6 08/29/2016 0831   CREATININE 0.9 08/29/2016 0831   CALCIUM 10.1 08/29/2016 0831   PROT 7.6 08/29/2016 0831   ALBUMIN 3.8 08/29/2016 0831   AST 19 08/29/2016 0831   ALT 21 08/29/2016 0831   ALKPHOS 72 08/29/2016 0831   BILITOT 0.31 08/29/2016 0831   GFRNONAA >60 03/22/2016 1847   GFRAA >60 03/22/2016 1847         RADIOGRAPHY: Mr Jeri Cos Wo Contrast  Result Date: 08/23/2016  [facility] [address], [place], [place] [address] [phone] NEUROIMAGING REPORT STUDY DATE: 08/16/2016 PATIENT NAME: Stacy [name] DOB: [birth year] MRN: [id number] ORDERING CLINICIAN: Dr Felecia Shelling CLINICAL HISTORY:  32 year patient with episode of altered awareness COMPARISON FILMS: CT head 03/22/2016 EXAM: MRI Brain w/wo TECHNIQUE: MRI of the brain with and without contrast was obtained utilizing 5 mm axial slices with T1, T2, T2 flair, T2 star gradient echo and diffusion weighted views.  T1 sagittal, T2 coronal and postcontrast views in the axial and coronal plane were obtained. CONTRAST: 10 ml iv Gadavist IMAGING SITE: [facility]: The  brain parenchyma shows no abnormal signal intensities. No structural lesion, tumor infarcts are noted. The calvarium shows no abnormalities. The paranasal sinuses show evidence of left frontal and ethmoid sinusitis. Orbits appear unremarkable. Subarachnoid spaces and ventricular system appear normal. Flow voids of the large vessels of endocrine circulation appear to be patent. The visualized portion of the upper cervical spine appears normal. Contrast images do not result in any abnormal areas of enhancement.    Unremarkable MRI scan of the brain with and without contrast. Incidental left frontal and ethmoid sinusitis are noted. INTERPRETING PHYSICIAN: Antony Contras, MD Certified in  Neuroimaging by [employer] and [employer]   US Breast Ltd Uni Left Inc Axilla  Result Date: 08/20/2016 CLINICAL DATA:  Screening recall for a possible left breast distortion. EXAM: 2D DIGITAL DIAGNOSTIC UNILATERAL LEFT MAMMOGRAM WITH CAD AND ADJUNCT TOMO Left breast ultrasound COMPARISON:  Previous exam(s). ACR Breast Density Category b: There are scattered areas of fibroglandular density. FINDINGS: In the upper-outer quadrant of the left breast, posterior depth there is a small spiculated mass. Mammographic images were processed with CAD. No discrete palpable mass is identified in the upper-outer quadrant of the  left breast on physical exam. Ultrasound targeted to the left breast at 1 o'clock, 9 cm from the nipple demonstrates an irregular spiculated shadowing mass measuring 1.2 x 0.5 x 1.0 cm. Ultrasound of the left axilla demonstrates multiple normal-appearing lymph nodes. IMPRESSION: 1. There is a highly suspicious mass in the left breast at 1 o'clock. 2.  No evidence of left axillary lymphadenopathy. RECOMMENDATION: Ultrasound-guided biopsy is recommended for the left breast mass. Will been scheduled for 08/21/2016 at 3:45 p.m. I have discussed the findings and  recommendations with the patient. Results were also provided in writing at the conclusion of the visit. If applicable, a reminder letter will be sent to the patient regarding the next appointment. BI-RADS CATEGORY  5: Highly suggestive of malignancy. Electronically Signed   By: Ammie Ferrier M.D.   On: 08/20/2016 08:20   Mm Diag Breast Tomo Uni Left  Result Date: 08/20/2016 CLINICAL DATA:  Screening recall for a possible left breast distortion. EXAM: 2D DIGITAL DIAGNOSTIC UNILATERAL LEFT MAMMOGRAM WITH CAD AND ADJUNCT TOMO Left breast ultrasound COMPARISON:  Previous exam(s). ACR Breast Density Category b: There are scattered areas of fibroglandular density. FINDINGS: In the upper-outer quadrant of the left breast, posterior depth there is a small spiculated mass. Mammographic images were processed with CAD. No discrete palpable mass is identified in the upper-outer quadrant of the left breast on physical exam. Ultrasound targeted to the left breast at 1 o'clock, 9 cm from the nipple demonstrates an irregular spiculated shadowing mass measuring 1.2 x 0.5 x 1.0 cm. Ultrasound of the left axilla demonstrates multiple normal-appearing lymph nodes. IMPRESSION: 1. There is a highly suspicious mass in the left breast at 1 o'clock. 2.  No evidence of left axillary lymphadenopathy. RECOMMENDATION: Ultrasound-guided biopsy is recommended for the left breast mass. Will been scheduled for 08/21/2016 at 3:45 p.m. I have discussed the findings and recommendations with the patient. Results were also provided in writing at the conclusion of the visit. If applicable, a reminder letter will be sent to the patient regarding the next appointment. BI-RADS CATEGORY  5: Highly suggestive of malignancy. Electronically Signed   By: Ammie Ferrier M.D.   On: 08/20/2016 08:20   Mm Screening Breast Tomo Bilateral  Result Date: 08/11/2016 CLINICAL DATA:  Screening. EXAM: 2D DIGITAL SCREENING BILATERAL MAMMOGRAM WITH CAD AND  ADJUNCT TOMO COMPARISON:  Previous exam(s). ACR Breast Density Category c: The breast tissue is heterogeneously dense, which may obscure small masses. FINDINGS: In the left breast, possible distortion warrants further evaluation. In the right breast, no findings suspicious for malignancy. Images were processed with CAD. IMPRESSION: Further evaluation is suggested for possible distortion in the left breast. RECOMMENDATION: Diagnostic mammogram and possibly ultrasound of the left breast. (Code:[id number]) The patient will be contacted regarding the findings, and additional imaging will be scheduled. BI-RADS CATEGORY  0: Incomplete. Need additional imaging evaluation and/or prior mammograms for comparison. Electronically Signed   By: Dorise Bullion III M.D   On: 08/11/2016 07:15   Mm Clip Placement Left  Result Date: 08/21/2016 CLINICAL DATA:  Left breast 1 o'clock mass. EXAM: DIAGNOSTIC LEFT MAMMOGRAM POST ULTRASOUND BIOPSY COMPARISON:  Previous exam(s). FINDINGS: Mammographic images were obtained following ultrasound guided biopsy of left breast 1 o'clock mass/ architectural distortion. Two-view mammography demonstrates presence of coil shaped marker within the biopsy site in satisfactory radiographic position. IMPRESSION: Successful placement of coil shaped marker post ultrasound guided core needle biopsy of the left breast. Final Assessment: Post Procedure Mammograms for Marker Placement Electronically Signed  By: Fidela Salisbury M.D.   On: 08/21/2016 16:33   [place] Lt Breast Bx W Loc Dev 1st Lesion Img Bx Spec US Guide  Addendum Date: 08/22/2016   ADDENDUM REPORT: 08/22/2016 14:15 ADDENDUM: Pathology revealed GRADE I INVASIVE DUCTAL CARCINOMA, DUCTAL CARCINOMA IN SITU of the Left breast at the 1:00 o'clock location. This was found to be concordant by Dr. Fidela Salisbury. Pathology results were discussed with the patient by telephone. The patient reported doing well after the biopsy with tenderness at  the site. Post biopsy instructions and care were reviewed and questions were answered. The patient was encouraged to call [facility] for any additional concerns. The patient was referred to [facility] on August 29, 2016. Pathology results reported by Terie Purser, RN on 08/22/2016. Electronically Signed   By: Fidela Salisbury M.D.   On: 08/22/2016 14:15   Result Date: 08/22/2016 CLINICAL DATA:  Left breast 1 o'clock mass. EXAM: ULTRASOUND GUIDED LEFT BREAST CORE NEEDLE BIOPSY COMPARISON:  Previous exam(s). FINDINGS: I met with the patient and we discussed the procedure of ultrasound-guided biopsy, including benefits and alternatives. We discussed the high likelihood of a successful procedure. We discussed the risks of the procedure, including infection, bleeding, tissue injury, clip migration, and inadequate sampling. Informed written consent was given. The usual time-out protocol was performed immediately prior to the procedure. Using sterile technique and 1% Lidocaine as local anesthetic, under direct ultrasound visualization, a 14 gauge spring-loaded device was used to perform biopsy of left breast 1 o'clock mass using a lateral approach. At the conclusion of the procedure a coil shaped tissue marker clip was deployed into the biopsy cavity. Follow up 2 view mammogram was performed and dictated separately. IMPRESSION: Ultrasound guided biopsy of left breast 1 o'clock mass. No apparent complications. Electronically Signed: By: Fidela Salisbury M.D. On: 08/21/2016 16:27      IMPRESSION/PLAN:   Clinical stage 1 invasive ductal carcinoma of the left breast. Patient would be a good candidate for lumpectomy, sentinel node, and adjuvant radiation therapy.  Patient does wish to proceed with breast conservation therapy rather than mastectomy.   It was a pleasure meeting the patient today. We discussed  the risks, benefits, and side effects of radiotherapy. I recommend radiotherapy to the left breast to reduce her risk of locoregional recurrence by 2/3.  We discussed that radiation would take approximately 6.5 weeks to complete and that I would give the patient a few weeks to heal following surgery before starting treatment planning.  If chemotherapy were to be given, this would precede radiotherapy. We spoke about acute effects including skin irritation and fatigue as well as much less common late effects including internal organ injury or irritation. We spoke about the latest technology that is used to minimize the risk of late effects for patients undergoing radiotherapy to the breast or chest wall. No guarantees of treatment were given. The patient is enthusiastic about proceeding with treatment. I look forward to participating in the patient's care.  I will await her referral back to me for postoperative follow-up and eventual CT simulation/treatment planning. Patient will undergo left lumpectomy with sentinel lymph node biopsy and a port procedure. She will be seen in radiation oncology after the procedure for external radiation. She will undergo an echocardiogram and a chemotherapy class. She will be prescribed aromatase inhibitor for 5 years.    __________________________________________    This document serves as a  record of services personally performed by Gery Pray, MD. It was created on his behalf by Bethann Humble, a trained medical scribe. The creation of this record is based on the scribe's personal observations and the provider's statements to them. This document has been checked and approved by the attending provider.

## 2016-08-29 NOTE — Progress Notes (Signed)
Nutrition Assessment  Reason for Assessment:  Pt seen in Breast Clinic  ASSESSMENT:   55 year old female with new diagnosis of left breast cancer.  Past medical history of fibromyalgia, HTN  Patient eating normally prior to visit.  Medications:  reviewed  Labs: reviewed  Anthropometrics:   Height: 64 inches Weight: 225 lb BMI: 28   NUTRITION DIAGNOSIS: Food and nutrition related knowledge deficit related to new diagnosis of breast cancer as evidenced by no prior need for nutrition related information.  INTERVENTION:   Discussed and provided packet of information regarding nutritional tips for breast cancer patients.  Questions answered.  Teachback method used.      MONITORING, EVALUATION, and GOAL: Pt will consume a healthy plant based diet to maintain lean body mass throughout treatment.   Juventino Pavone B. Zenia Resides, [place], [place] (pager)

## 2016-08-29 NOTE — Progress Notes (Signed)
[facility] NOTE  Patient Care Team: [name], NP as PCP - General (Internal Medicine) [name], MD as Consulting Physician (General Surgery) Nicholas Lose, MD as Consulting Physician (Hematology and Oncology) Gery Pray, MD as Consulting Physician (Radiation Oncology)  CHIEF COMPLAINTS/PURPOSE OF CONSULTATION:  Newly diagnosed breast cancer  HISTORY OF PRESENTING ILLNESS:  Stacy [name] 55 y.o. female is here because of recent diagnosis of left breast cancer. Patient had a screening mammogram that revealed a left breast mass upper outer quadrant at 1:00 position measuring 1.2 x 1 x 0.5 cm. Axilla was negative. Biopsy of this revealed a grade 1 invasive ductal carcinoma that was ER/PR positive and HER-2 was positive with a Ki-67 of 30%. She was presented this morning at the multidisciplinary tumor board and she is here today to discuss the treatment plan.  I reviewed her records extensively and collaborated the history with the patient.  SUMMARY OF ONCOLOGIC HISTORY:   Malignant neoplasm of upper-outer quadrant of left female breast ([place])   08/21/2016 Initial Diagnosis    Screening detected left breast mass UOQ at 1:00: 1.2 x 1 x 0.5 cm, axilla negative Left breast biopsy 1:00: IDC with DCIS, grade 1, ER 90%, PR 70%, Ki-67 30%, HER-2 positive ratio 2.43, T1c N0 stage IA clinical stage       In terms of breast cancer risk profile:  She menarched at early age of 67 and is perimenopausal  She had 4 pregnancy, her first child was born at age 39  She has not received birth control pills.  She was never exposed to fertility medications or hormone replacement therapy.  She has no family history of Breast/GYN/GI cancer  MEDICAL HISTORY:  Past Medical History:  Diagnosis Date  . Anxiety   . Arthritis   . Complication of anesthesia    DIFFICULTY WAKING UP , LAST SURGERY NECK 2012 WAS FINE PER PT HERE AT [facility]  . Depression   . Fibromyalgia   . Headache    HX MIGRAINES    . Heart murmur   . Hypertension   . Malignant neoplasm of upper-outer quadrant of left female breast ([place]) 08/23/2016  . MVP (mitral valve prolapse)    AS INFANT  . Neck pain   . Vision abnormalities     SURGICAL HISTORY: Past Surgical History:  Procedure Laterality Date  . cervical fusiion  2012  . MITRAL VALVE REPAIR     MVP 1970  . ROTATOR CUFF REPAIR Right 2012  . TOTAL HIP ARTHROPLASTY Right 09/14/2015   Procedure: RIGHT TOTAL HIP ARTHROPLASTY ANTERIOR APPROACH;  Surgeon: Leandrew Koyanagi, MD;  Location: [facility];  Service: Orthopedics;  Laterality: Right;  . TUBAL LIGATION  1988    SOCIAL HISTORY: Social History   Social History  . Marital status: Married    Spouse name: N/A  . Number of children: N/A  . Years of education: N/A   Occupational History  . Not on file.   Social History Main Topics  . Smoking status: Current Some Day Smoker    Packs/day: 0.25    Types: Cigarettes  . Smokeless tobacco: Never Used  . Alcohol use No  . Drug use: No  . Sexual activity: Yes    Birth control/ protection: Post-menopausal   Other Topics Concern  . Not on file   Social History Narrative  . No narrative on file    FAMILY HISTORY: Family History  Problem Relation Age of Onset  . Adopted:  Yes    ALLERGIES:  is allergic to naproxen; erythromycin; tramadol hcl; gabapentin; and percocet [oxycodone-acetaminophen].  MEDICATIONS:  Current Outpatient Prescriptions  Medication Sig Dispense Refill  . hydrochlorothiazide (HYDRODIURIL) 25 MG tablet TAKE 1 TABLET BY MOUTH EVERY DAY 30 tablet 5  . ibuprofen (ADVIL,MOTRIN) 200 MG tablet Take 800 mg by mouth every 4 (four) hours as needed.     . Calcium Carbonate-Vitamin D3 (CALCIUM 600/VITAMIN D) 600-400 MG-UNIT TABS Take 1 tablet by mouth daily.    . cyclobenzaprine (FLEXERIL) 5 MG tablet Take 1 tablet (5 mg total) by mouth at bedtime. (Patient not taking: Reported on 08/29/2016) 30 tablet 3   No current  facility-administered medications for this visit.     REVIEW OF SYSTEMS:   Constitutional: Denies fevers, chills or abnormal night sweats Eyes: Denies blurriness of vision, double vision or watery eyes Ears, nose, mouth, throat, and face: Denies mucositis or sore throat Respiratory: Denies cough, dyspnea or wheezes Cardiovascular: Denies palpitation, chest discomfort or lower extremity swelling Gastrointestinal:  Denies nausea, heartburn or change in bowel habits Skin: Denies abnormal skin rashes Lymphatics: Denies new lymphadenopathy or easy bruising Neurological:Denies numbness, tingling or new weaknesses Behavioral/Psych: Mood is stable, no new changes  Breast:  Denies any palpable lumps or discharge All other systems were reviewed with the patient and are negative.  PHYSICAL EXAMINATION: ECOG PERFORMANCE STATUS: 0 - Asymptomatic  Vitals:   08/29/16 0849  BP: 134/87  Pulse: 89  Resp: 18  Temp: 97.7 F (36.5 C)   Filed Weights   08/29/16 0849  Weight: 225 lb 9.6 oz (102.3 kg)    GENERAL:alert, no distress and comfortable SKIN: skin color, texture, turgor are normal, no rashes or significant lesions EYES: normal, conjunctiva are pink and non-injected, sclera clear OROPHARYNX:no exudate, no erythema and lips, buccal mucosa, and tongue normal  NECK: supple, thyroid normal size, non-tender, without nodularity LYMPH:  no palpable lymphadenopathy in the cervical, axillary or inguinal LUNGS: clear to auscultation and percussion with normal breathing effort HEART: regular rate & rhythm and no murmurs and no lower extremity edema ABDOMEN:abdomen soft, non-tender and normal bowel sounds Musculoskeletal:no cyanosis of digits and no clubbing  PSYCH: alert & oriented x 3 with fluent speech NEURO: no focal motor/sensory deficits BREAST: No palpable nodules in breast. No palpable axillary or supraclavicular lymphadenopathy (exam performed in the presence of a chaperone)   LABORATORY  DATA:  I have reviewed the data as listed Lab Results  Component Value Date   WBC 11.7 (H) 08/29/2016   HGB 14.9 08/29/2016   HCT 44.7 08/29/2016   MCV 88.5 08/29/2016   PLT 272 08/29/2016   Lab Results  Component Value Date   NA 141 08/29/2016   K 4.3 08/29/2016   CL 99 04/20/2016   CO2 27 08/29/2016    RADIOGRAPHIC STUDIES: I have personally reviewed the radiological reports and agreed with the findings in the report.  ASSESSMENT AND PLAN:  Malignant neoplasm of upper-outer quadrant of left female breast ([place]) 08/21/2016: Screening detected left breast mass UOQ at 1:00: 1.2 x 1 x 0.5 cm, axilla negative Left breast biopsy 1:00: IDC with DCIS, grade 1, ER 90%, PR 70%, Ki-67 30%, HER-2 positive ratio 2.43, T1c N0 stage IA clinical stage  Pathology and radiology counseling:Discussed with the patient, the details of pathology including the type of breast cancer,the clinical staging, the significance of ER, PR and HER-2/neu receptors and the implications for treatment. After reviewing the pathology in detail, we proceeded to discuss  the different treatment options between surgery, radiation, chemotherapy, antiestrogen therapies.  Recommendations: 1. Breast conserving surgery followed by 2. adjuvant chemotherapy with Herceptin (discussed Taxol and Herceptin weekly 12 followed by Herceptin maintenance to complete 1 year) 3. Adjuvant radiation therapy followed by 4. Adjuvant antiestrogen therapy  I discussed with the patient that the adjuvant chemotherapy plan would be be discussed after her surgery depending on the final pathology report. I discussed risks and benefits of chemotherapy. Taxol-related allergy reactions were also discussed along with neuropathy, nausea, fatigue, hair loss, decreased blood counts. Herceptin related cardiac toxicities were also discussed. Patient would need an echocardiogram every 3 months for 1 year.  Return to clinic after surgery to discuss the final  treatment plan Patient will need echocardiogram, chemotherapy class, port placement   All questions were answered. The patient knows to call the clinic with any problems, questions or concerns.    Rulon Eisenmenger, MD 08/29/16

## 2016-08-29 NOTE — Progress Notes (Signed)
Clinical Social Work [place] Psychosocial Distress Screening [place]  Patient completed distress screening protocol and scored a 10 on the Psychosocial Distress Thermometer which indicates severe distress. Clinical Social Worker met with patient and patients family in [facility] to assess for distress and other psychosocial needs. Patient stated she was feeling overwhelmed and was tearful, but stated it was helpful meeting with the treatment team and getting more information on her treatment plan. CSW and patient discussed common feeling and emotions when being diagnosed with cancer, and the importance of support during treatment. CSW informed patient of the support team and support services at [facility]. CSW provided contact information and encouraged patient to call with any questions or concerns.  ONCBCN DISTRESS SCREENING 08/29/2016  Screening Type Initial Screening  Distress experienced in past week (1-10) 10  Practical problem type Housing;Work/school  Family Problem type Partner  Emotional problem type Depression;Nervousness/Anxiety;Adjusting to illness  Spiritual/Religous concerns type Facing my mortality  Information Concerns Type Lack of info about diagnosis;Lack of info about treatment  Physical Problem type Pain;Sleep/insomnia  Physician notified of physical symptoms Yes  Referral to clinical social work Yes     Johnnye Lana, MSW, LCSW, OSW-C Clinical Social Worker [place] [phone]

## 2016-09-03 ENCOUNTER — Encounter (HOSPITAL_BASED_OUTPATIENT_CLINIC_OR_DEPARTMENT_OTHER): Payer: Self-pay | Admitting: *Deleted

## 2016-09-03 ENCOUNTER — Telehealth: Payer: Self-pay | Admitting: *Deleted

## 2016-09-03 ENCOUNTER — Other Ambulatory Visit: Payer: Self-pay | Admitting: General Surgery

## 2016-09-03 DIAGNOSIS — C50412 Malignant neoplasm of upper-outer quadrant of left female breast: Secondary | ICD-10-CM

## 2016-09-03 DIAGNOSIS — Z170 Estrogen receptor positive status [ER+]: Principal | ICD-10-CM

## 2016-09-03 NOTE — Telephone Encounter (Signed)
Spoke with patient and confirmed echo appointment for 1/2 at 11am.     Oncology Nurse Navigator Documentation  Navigator Location: [facility] (09/03/16 1400)   )Navigator Encounter Type: Telephone (09/03/16 1400) Telephone: Outgoing Call;Clinic/[facility] Follow-up (09/03/16 1400)                                                  Time Spent with Patient: 15 (09/03/16 1400)

## 2016-09-04 ENCOUNTER — Ambulatory Visit
Admission: RE | Admit: 2016-09-04 | Discharge: 2016-09-04 | Disposition: A | Discharge status: Home / Self Care | Payer: BLUE CROSS/BLUE SHIELD | Source: Ambulatory Visit | Attending: General Surgery | Admitting: General Surgery

## 2016-09-04 DIAGNOSIS — C50912 Malignant neoplasm of unspecified site of left female breast: Secondary | ICD-10-CM | POA: Diagnosis not present

## 2016-09-04 DIAGNOSIS — Z170 Estrogen receptor positive status [ER+]: Principal | ICD-10-CM

## 2016-09-04 DIAGNOSIS — C50412 Malignant neoplasm of upper-outer quadrant of left female breast: Secondary | ICD-10-CM

## 2016-09-05 NOTE — Progress Notes (Signed)
Olive Bass Rn gave husband Boost drink for pt, instructed him to tell her to drink by [phone] day of surgery with teach back method.

## 2016-09-05 NOTE — H&P (Signed)
Stacy [name] 08/29/2016 6:51 AM Location: Stacy [name] Patient #: [id number] DOB: 01-31-1961 Undefined / Language: Stacy [name] / Race: White Female   History of Present Illness Stacy Klein MD; 08/29/2016 12:57 PM) The patient is a 55 year old female who presents with breast cancer. Patient is a 55 yo F who is referred by Dr. Webb [name] for consultation regarding a new diagnosis of left breast cancer. She presented with a screening detected left breast mass. Diagnostic imaging showed a 1.2 cm mass at 1 o'clock. The axilla was negative for concerning lymphadenopathy. Core needle biopsy was performed and showed a grade 1 invasive ductal carcinoma that is ER and PR +, her-2 positive, and Ki 67 was 30%.   She is adopted and does not know family history. She had menarche at age 61. She is still having periods, but they are irregular. She is a G4P4, with first child at age 70. She has not used hormonal contraception. She is accompanied by a son and her husband. She is s/p mitral valve repair at age 72.   dx mammogram/ultrasound FINDINGS: In the upper-outer quadrant of the left breast, posterior depth there is a small spiculated mass.  Mammographic images were processed with CAD.  No discrete palpable mass is identified in the upper-outer quadrant of the left breast on physical exam.  Ultrasound targeted to the left breast at 1 o'clock, 9 cm from the nipple demonstrates an irregular spiculated shadowing mass measuring 1.2 x 0.5 x 1.0 cm.  Ultrasound of the left axilla demonstrates multiple normal-appearing lymph nodes.  IMPRESSION: 1. There is a highly suspicious mass in the left breast at 1 o'clock.  2. No evidence of left axillary lymphadenopathy.  RECOMMENDATION: Ultrasound-guided biopsy is recommended for the left breast mass. Will been scheduled for 08/21/2016 at 3:45 p.m.  Pathology Diagnosis Breast, left, needle core biopsy, 1 o'clock - INVASIVE  DUCTAL CARCINOMA. - DUCTAL CARCINOMA IN SITU. - SEE COMMENT. Microscopic Comment The carcinoma appears grade I.  CMET normal, CBC normal except for WBCs 11.7k.   Past Surgical History Stacy Klein, MD; 08/29/2016 6:51 AM) Breast Biopsy  Left. Hip [name]  Right. Oral [name]  Shoulder [name]  Right. Spinal [name] - Neck   Diagnostic Studies History Stacy Klein, MD; 08/29/2016 6:51 AM) Colonoscopy  never Mammogram  within last year Pap Smear  1-5 years ago  Medication History Stacy Klein, MD; 08/29/2016 12:58 PM) Medications Reconciled  Social History Stacy Klein, MD; 08/29/2016 6:51 AM) Caffeine use  Carbonated beverages, Coffee, Tea. No alcohol use  No drug use  Tobacco use  Current every day smoker.  Family History Stacy Klein, MD; 08/29/2016 6:51 AM) Family history unknown  First Degree Relatives   Pregnancy / Birth History Stacy Klein, MD; 08/29/2016 6:51 AM) Age at menarche  43 years. Gravida  4 Irregular periods  Maternal age  17-20 Para  6  Other Problems Stacy Klein, MD; 08/29/2016 6:51 AM) Anxiety Disorder  Arthritis  Back Pain  Breast Cancer  Chest pain  General anesthesia - complications  Heart murmur  High blood pressure  Lump In Breast  Migraine Headache  Transfusion history     Review of Systems Stacy Klein MD; 08/29/2016 6:51 AM) General Present- Fatigue and Weight Gain. Not Present- Appetite Loss, Chills, Fever, Night Sweats and Weight Loss. Skin Not Present- Change in Wart/Mole, Dryness, Hives, Jaundice, New Lesions, Non-Healing Wounds, Rash and Ulcer. HEENT Present- Wears glasses/contact lenses. Not Present- Earache, Hearing Loss, Hoarseness, Nose Bleed, Oral Ulcers, Ringing  in the Ears, Seasonal Allergies, Sinus Pain, Sore Throat, Visual Disturbances and Yellow Eyes. Respiratory Present- Snoring. Not Present- Bloody sputum, Chronic Cough, Difficulty Breathing and Wheezing. Breast Not  Present- Breast Mass, Breast Pain, Nipple Discharge and Skin Changes. Cardiovascular Present- Difficulty Breathing Lying Down, Palpitations and Rapid Heart Rate. Not Present- Chest Pain, Leg Cramps, Shortness of Breath and Swelling of Extremities. Gastrointestinal Present- Bloating and Gets full quickly at meals. Not Present- Abdominal Pain, Bloody Stool, Change in Bowel Habits, Chronic diarrhea, Constipation, Difficulty Swallowing, Excessive gas, Hemorrhoids, Indigestion, Nausea, Rectal Pain and Vomiting. Female Genitourinary Present- Urgency. Not Present- Frequency, Nocturia, Painful Urination and Pelvic Pain. Musculoskeletal Present- Back Pain, Joint Pain and Joint Stiffness. Not Present- Muscle Pain, Muscle Weakness and Swelling of Extremities. Neurological Present- Headaches and Numbness. Not Present- Decreased Memory, Fainting, Seizures, Tingling, Tremor, Trouble walking and Weakness. Psychiatric Not Present- Anxiety, Bipolar, Change in Sleep Pattern, Depression, Fearful and Frequent crying. Endocrine Present- Cold Intolerance. Not Present- Excessive Hunger, Hair Changes, Heat Intolerance, Hot flashes and New Diabetes. Hematology Not Present- Blood Thinners, Easy Bruising, Excessive bleeding, Gland problems, HIV and Persistent Infections.   Physical Exam Stacy Klein MD; 08/29/2016 12:59 PM) General Mental Status-Alert. General Appearance-Consistent with stated age. Hydration-Well hydrated. Voice-Normal.  Head and Neck Head-normocephalic, atraumatic with no lesions or palpable masses. Trachea-midline. Thyroid Gland Characteristics - normal size and consistency.  Eye Eyeball - Bilateral-Extraocular movements intact. Sclera/Conjunctiva - Bilateral-No scleral icterus.  Chest and Lung Exam Chest and lung exam reveals -quiet, even and easy respiratory effort with no use of accessory muscles and on auscultation, normal breath sounds, no adventitious sounds and normal  vocal resonance. Inspection Chest Wall - Normal. Back - normal.  Breast Note: no palpable masses in either breasts. they are symmetric and ptotic bilaterally. No nipple retraction or skin dimpling. No lymphadenopathy. small hematoma in upper left breast. no nipple discharge.   Cardiovascular Cardiovascular examination reveals -normal heart sounds, regular rate and rhythm with no murmurs and normal pedal pulses bilaterally.  Abdomen Inspection Inspection of the abdomen reveals - No Hernias. Palpation/Percussion Palpation and Percussion of the abdomen reveal - Soft, Non Tender, No Rebound tenderness, No Rigidity (guarding) and No hepatosplenomegaly. Auscultation Auscultation of the abdomen reveals - Bowel sounds normal.  Neurologic Neurologic evaluation reveals -alert and oriented x 3 with no impairment of recent or remote memory. Mental Status-Normal.  Musculoskeletal Global Assessment -Note: no gross deformities.  Normal Exam - Left-Upper Extremity Strength Normal and Lower Extremity Strength Normal. Normal Exam - Right-Upper Extremity Strength Normal and Lower Extremity Strength Normal.  Lymphatic Head & Neck  General Head & Neck Lymphatics: Bilateral - Description - Normal. Axillary  General Axillary Region: Bilateral - Description - Normal. Tenderness - Non Tender. Femoral & Inguinal  Generalized Femoral & Inguinal Lymphatics: Bilateral - Description - No Generalized lymphadenopathy.    Assessment & Plan Stacy Klein MD; 08/29/2016 1:01 PM) MALIGNANT NEOPLASM OF UPPER-OUTER QUADRANT OF LEFT BREAST IN FEMALE, ESTROGEN RECEPTOR POSITIVE (C50.412) Impression: Patient has a new diagnosis of a cT1cN0 left breast cancer.  She is a good candidate for breast conservation. We will do a seed localized lumpectomy with sentinel lymph node biopsy with port placement. This will be followed by chemotherapy, external beam radiation and adjuvant hormonal  chemoprevention.  I reviewed the risks of [name]. I discussed the timeline of [name] with her including placement of seed. I reviewed the pectoral block and sentinel node injection. I discussed that this is an outpatient procedure.  The surgical procedure  was described to the patient. I discussed the incision type and location and that we would need radiology involved on with a wire or seed marker and/or sentinel node.  The risks and benefits of the procedure were described to the patient and she wishes to proceed.  We discussed the risks bleeding, infection, damage to other structures, need for further procedures/surgeries. We discussed the risk of seroma. The patient was advised if the area in the breast in cancer, we may need to go back to [name] for additional tissue to obtain negative margins or for a lymph node biopsy. The patient was advised that these are the most common complications, but that others can occur as well. They were advised against taking aspirin or other anti-inflammatory agents/blood thinners the week before [name].  I reviewed post operative recovery and restrictions. The patient understands and wishes to proceed. Current Plans You are being scheduled for [name]- Our schedulers will call you.  You should hear from our office's scheduling department within 5 working days about the location, date, and time of [name]. We try to make accommodations for patient's preferences in scheduling [name], but sometimes the OR schedule or the surgeon's schedule prevents [place] from making those accommodations.  If you have not heard from our office [phone]) in 5 working days, call the office and ask for your surgeon's nurse.  If you have other questions about your diagnosis, plan, or [name], call the office and ask for your surgeon's nurse.  Pt Education - flb breast cancer [name]: discussed with patient and provided information.   Signed by Stacy Klein, MD (08/29/2016  1:02 PM)

## 2016-09-06 ENCOUNTER — Ambulatory Visit (HOSPITAL_COMMUNITY)
Admission: RE | Admit: 2016-09-06 | Discharge: 2016-09-06 | Disposition: A | Discharge status: Home / Self Care | Payer: BLUE CROSS/BLUE SHIELD | Source: Ambulatory Visit | Attending: General Surgery | Admitting: General Surgery

## 2016-09-06 ENCOUNTER — Ambulatory Visit (HOSPITAL_BASED_OUTPATIENT_CLINIC_OR_DEPARTMENT_OTHER)
Admission: RE | Admit: 2016-09-06 | Discharge: 2016-09-06 | Disposition: A | Discharge status: Home / Self Care | Payer: BLUE CROSS/BLUE SHIELD | Source: Ambulatory Visit | Attending: General Surgery | Admitting: General Surgery

## 2016-09-06 ENCOUNTER — Ambulatory Visit (HOSPITAL_BASED_OUTPATIENT_CLINIC_OR_DEPARTMENT_OTHER): Payer: BLUE CROSS/BLUE SHIELD | Admitting: Anesthesiology

## 2016-09-06 ENCOUNTER — Ambulatory Visit (HOSPITAL_BASED_OUTPATIENT_CLINIC_OR_DEPARTMENT_OTHER)
Admission: RE | Admit: 2016-09-06 | Discharge: 2016-09-06 | Disposition: A | Discharge status: Home / Self Care | Payer: BLUE CROSS/BLUE SHIELD | Source: Ambulatory Visit | Attending: Hematology and Oncology | Admitting: Hematology and Oncology

## 2016-09-06 ENCOUNTER — Encounter (HOSPITAL_BASED_OUTPATIENT_CLINIC_OR_DEPARTMENT_OTHER)
Admission: RE | Disposition: A | Discharge status: Home / Self Care | Payer: Self-pay | Source: Ambulatory Visit | Attending: General Surgery

## 2016-09-06 ENCOUNTER — Telehealth (HOSPITAL_COMMUNITY): Payer: Self-pay | Admitting: Vascular Surgery

## 2016-09-06 ENCOUNTER — Ambulatory Visit (HOSPITAL_COMMUNITY): Payer: BLUE CROSS/BLUE SHIELD

## 2016-09-06 ENCOUNTER — Encounter (HOSPITAL_BASED_OUTPATIENT_CLINIC_OR_DEPARTMENT_OTHER): Payer: Self-pay

## 2016-09-06 ENCOUNTER — Ambulatory Visit
Admission: RE | Admit: 2016-09-06 | Discharge: 2016-09-06 | Disposition: A | Discharge status: Home / Self Care | Payer: BLUE CROSS/BLUE SHIELD | Source: Ambulatory Visit | Attending: General Surgery | Admitting: General Surgery

## 2016-09-06 DIAGNOSIS — R011 Cardiac murmur, unspecified: Secondary | ICD-10-CM | POA: Diagnosis not present

## 2016-09-06 DIAGNOSIS — Z170 Estrogen receptor positive status [ER+]: Principal | ICD-10-CM

## 2016-09-06 DIAGNOSIS — F419 Anxiety disorder, unspecified: Secondary | ICD-10-CM | POA: Insufficient documentation

## 2016-09-06 DIAGNOSIS — Z6837 Body mass index (BMI) 37.0-37.9, adult: Secondary | ICD-10-CM | POA: Diagnosis not present

## 2016-09-06 DIAGNOSIS — M1990 Unspecified osteoarthritis, unspecified site: Secondary | ICD-10-CM | POA: Insufficient documentation

## 2016-09-06 DIAGNOSIS — G8918 Other acute postprocedural pain: Secondary | ICD-10-CM | POA: Diagnosis not present

## 2016-09-06 DIAGNOSIS — Z888 Allergy status to other drugs, medicaments and biological substances status: Secondary | ICD-10-CM | POA: Insufficient documentation

## 2016-09-06 DIAGNOSIS — I10 Essential (primary) hypertension: Secondary | ICD-10-CM | POA: Insufficient documentation

## 2016-09-06 DIAGNOSIS — Z452 Encounter for adjustment and management of vascular access device: Secondary | ICD-10-CM | POA: Diagnosis not present

## 2016-09-06 DIAGNOSIS — Z885 Allergy status to narcotic agent status: Secondary | ICD-10-CM | POA: Diagnosis not present

## 2016-09-06 DIAGNOSIS — C50412 Malignant neoplasm of upper-outer quadrant of left female breast: Secondary | ICD-10-CM | POA: Insufficient documentation

## 2016-09-06 DIAGNOSIS — F329 Major depressive disorder, single episode, unspecified: Secondary | ICD-10-CM | POA: Insufficient documentation

## 2016-09-06 DIAGNOSIS — N6012 Diffuse cystic mastopathy of left breast: Secondary | ICD-10-CM | POA: Diagnosis not present

## 2016-09-06 DIAGNOSIS — C50912 Malignant neoplasm of unspecified site of left female breast: Secondary | ICD-10-CM | POA: Diagnosis not present

## 2016-09-06 DIAGNOSIS — F17210 Nicotine dependence, cigarettes, uncomplicated: Secondary | ICD-10-CM | POA: Insufficient documentation

## 2016-09-06 DIAGNOSIS — E669 Obesity, unspecified: Secondary | ICD-10-CM | POA: Insufficient documentation

## 2016-09-06 DIAGNOSIS — Z886 Allergy status to analgesic agent status: Secondary | ICD-10-CM | POA: Diagnosis not present

## 2016-09-06 DIAGNOSIS — N6022 Fibroadenosis of left breast: Secondary | ICD-10-CM | POA: Insufficient documentation

## 2016-09-06 DIAGNOSIS — M797 Fibromyalgia: Secondary | ICD-10-CM | POA: Diagnosis not present

## 2016-09-06 DIAGNOSIS — D242 Benign neoplasm of left breast: Secondary | ICD-10-CM | POA: Insufficient documentation

## 2016-09-06 DIAGNOSIS — R918 Other nonspecific abnormal finding of lung field: Secondary | ICD-10-CM | POA: Diagnosis not present

## 2016-09-06 DIAGNOSIS — Z95828 Presence of other vascular implants and grafts: Secondary | ICD-10-CM

## 2016-09-06 HISTORY — PX: RADIOACTIVE SEED GUIDED PARTIAL MASTECTOMY WITH AXILLARY SENTINEL LYMPH NODE BIOPSY: SHX6520

## 2016-09-06 HISTORY — PX: PORTACATH PLACEMENT: SHX2246

## 2016-09-06 SURGERY — RADIOACTIVE SEED GUIDED PARTIAL MASTECTOMY WITH AXILLARY SENTINEL LYMPH NODE BIOPSY
Anesthesia: Regional | Site: Chest | Laterality: Right

## 2016-09-06 MED ORDER — MIDAZOLAM HCL 2 MG/2ML IJ SOLN
INTRAMUSCULAR | Status: AC
  Filled 2016-09-06: qty 2

## 2016-09-06 MED ORDER — HEPARIN SOD (PORK) LOCK FLUSH 100 UNIT/ML IV SOLN
INTRAVENOUS | Status: AC
  Filled 2016-09-06: qty 5

## 2016-09-06 MED ORDER — HEPARIN (PORCINE) IN NACL 2-0.9 UNIT/ML-% IJ SOLN
INTRAMUSCULAR | Status: DC | PRN
  Administered 2016-09-06: 1 via INTRAVENOUS

## 2016-09-06 MED ORDER — FENTANYL CITRATE (PF) 100 MCG/2ML IJ SOLN
INTRAMUSCULAR | Status: AC
  Filled 2016-09-06: qty 2

## 2016-09-06 MED ORDER — MEPERIDINE HCL 25 MG/ML IJ SOLN: 6.2500 mg | INTRAMUSCULAR | Status: DC | PRN

## 2016-09-06 MED ORDER — SCOPOLAMINE 1 MG/3DAYS TD PT72: 1.0000 | MEDICATED_PATCH | Freq: Once | TRANSDERMAL | Status: DC | PRN

## 2016-09-06 MED ORDER — PROPOFOL 10 MG/ML IV BOLUS
INTRAVENOUS | Status: DC | PRN
  Administered 2016-09-06: 200 mg via INTRAVENOUS

## 2016-09-06 MED ORDER — DEXAMETHASONE SODIUM PHOSPHATE 10 MG/ML IJ SOLN
INTRAMUSCULAR | Status: AC
  Filled 2016-09-06: qty 1

## 2016-09-06 MED ORDER — HYDROMORPHONE HCL 1 MG/ML IJ SOLN
INTRAMUSCULAR | Status: AC
  Filled 2016-09-06: qty 1

## 2016-09-06 MED ORDER — LACTATED RINGERS IV SOLN
INTRAVENOUS | Status: DC
  Administered 2016-09-06: 16:00:00 via INTRAVENOUS
  Administered 2016-09-06: 10 mL/h via INTRAVENOUS
  Administered 2016-09-06: 14:00:00 via INTRAVENOUS

## 2016-09-06 MED ORDER — ACETAMINOPHEN 500 MG PO TABS
ORAL_TABLET | ORAL | Status: AC
  Filled 2016-09-06: qty 2

## 2016-09-06 MED ORDER — CHLORHEXIDINE GLUCONATE CLOTH 2 % EX PADS: 6.0000 | MEDICATED_PAD | Freq: Once | CUTANEOUS | Status: DC

## 2016-09-06 MED ORDER — BUPIVACAINE-EPINEPHRINE (PF) 0.5% -1:200000 IJ SOLN
INTRAMUSCULAR | Status: DC | PRN
  Administered 2016-09-06: 30 mL via PERINEURAL

## 2016-09-06 MED ORDER — CEFAZOLIN SODIUM-DEXTROSE 2-4 GM/100ML-% IV SOLN
2.0000 g | INTRAVENOUS | Status: AC
  Administered 2016-09-06: 2 g via INTRAVENOUS

## 2016-09-06 MED ORDER — PROPOFOL 10 MG/ML IV BOLUS
INTRAVENOUS | Status: AC
  Filled 2016-09-06: qty 20

## 2016-09-06 MED ORDER — HEPARIN (PORCINE) IN NACL 2-0.9 UNIT/ML-% IJ SOLN
INTRAMUSCULAR | Status: AC
  Filled 2016-09-06: qty 500

## 2016-09-06 MED ORDER — DEXAMETHASONE SODIUM PHOSPHATE 4 MG/ML IJ SOLN
INTRAMUSCULAR | Status: DC | PRN
  Administered 2016-09-06: 10 mg via INTRAVENOUS

## 2016-09-06 MED ORDER — LIDOCAINE HCL (CARDIAC) 20 MG/ML IV SOLN
INTRAVENOUS | Status: DC | PRN
  Administered 2016-09-06: 30 mg via INTRAVENOUS

## 2016-09-06 MED ORDER — ONDANSETRON HCL 4 MG/2ML IJ SOLN
INTRAMUSCULAR | Status: AC
  Filled 2016-09-06: qty 2

## 2016-09-06 MED ORDER — HYDROCODONE-ACETAMINOPHEN 5-325 MG PO TABS: 1.0000 | ORAL_TABLET | ORAL | 0 refills | Status: DC | PRN

## 2016-09-06 MED ORDER — SODIUM CHLORIDE 0.9 % IJ SOLN
INTRAMUSCULAR | Status: AC
  Filled 2016-09-06: qty 10

## 2016-09-06 MED ORDER — EPHEDRINE 5 MG/ML INJ
INTRAVENOUS | Status: AC
  Filled 2016-09-06: qty 10

## 2016-09-06 MED ORDER — ACETAMINOPHEN 500 MG PO TABS
1000.0000 mg | ORAL_TABLET | ORAL | Status: AC
  Administered 2016-09-06: 1000 mg via ORAL

## 2016-09-06 MED ORDER — CEFAZOLIN SODIUM-DEXTROSE 2-4 GM/100ML-% IV SOLN
INTRAVENOUS | Status: AC
  Filled 2016-09-06: qty 100

## 2016-09-06 MED ORDER — METHYLENE BLUE 0.5 % INJ SOLN
INTRAVENOUS | Status: AC
  Filled 2016-09-06: qty 10

## 2016-09-06 MED ORDER — TECHNETIUM TC 99M SULFUR COLLOID FILTERED
1.0000 | Freq: Once | INTRAVENOUS | Status: AC | PRN
  Administered 2016-09-06: 1 via INTRADERMAL

## 2016-09-06 MED ORDER — HYDROMORPHONE HCL 1 MG/ML IJ SOLN
0.2500 mg | INTRAMUSCULAR | Status: DC | PRN
  Administered 2016-09-06 (×4): 0.5 mg via INTRAVENOUS

## 2016-09-06 MED ORDER — METOCLOPRAMIDE HCL 5 MG/ML IJ SOLN: 10.0000 mg | Freq: Once | INTRAMUSCULAR | Status: DC | PRN

## 2016-09-06 MED ORDER — PHENYLEPHRINE 40 MCG/ML (10ML) SYRINGE FOR IV PUSH (FOR BLOOD PRESSURE SUPPORT)
PREFILLED_SYRINGE | INTRAVENOUS | Status: AC
  Filled 2016-09-06: qty 10

## 2016-09-06 MED ORDER — SODIUM CHLORIDE 0.9 % IJ SOLN
INTRAVENOUS | Status: DC | PRN
  Administered 2016-09-06: 5 mL via INTRADERMAL

## 2016-09-06 MED ORDER — FENTANYL CITRATE (PF) 100 MCG/2ML IJ SOLN
50.0000 ug | INTRAMUSCULAR | Status: AC | PRN
  Administered 2016-09-06: 25 ug via INTRAVENOUS
  Administered 2016-09-06: 50 ug via INTRAVENOUS
  Administered 2016-09-06 (×2): 100 ug via INTRAVENOUS
  Administered 2016-09-06 (×3): 25 ug via INTRAVENOUS

## 2016-09-06 MED ORDER — SUCCINYLCHOLINE CHLORIDE 200 MG/10ML IV SOSY
PREFILLED_SYRINGE | INTRAVENOUS | Status: AC
  Filled 2016-09-06: qty 10

## 2016-09-06 MED ORDER — LIDOCAINE-EPINEPHRINE (PF) 1 %-1:200000 IJ SOLN
INTRAMUSCULAR | Status: DC | PRN
  Administered 2016-09-06 (×2): 10 mL via INTRAMUSCULAR

## 2016-09-06 MED ORDER — LIDOCAINE 2% (20 MG/ML) 5 ML SYRINGE
INTRAMUSCULAR | Status: AC
  Filled 2016-09-06: qty 5

## 2016-09-06 MED ORDER — MIDAZOLAM HCL 2 MG/2ML IJ SOLN
1.0000 mg | INTRAMUSCULAR | Status: DC | PRN
  Administered 2016-09-06: 2 mg via INTRAVENOUS
  Administered 2016-09-06: 1 mg via INTRAVENOUS
  Administered 2016-09-06: 2 mg via INTRAVENOUS

## 2016-09-06 SURGICAL SUPPLY — 77 items
ADH SKN CLS APL DERMABOND .7 (GAUZE/BANDAGES/DRESSINGS) ×4
APPLIER CLIP 9.375 MED OPEN (MISCELLANEOUS)
APR CLP MED 9.3 20 MLT OPN (MISCELLANEOUS)
BAG DECANTER FOR FLEXI CONT (MISCELLANEOUS) ×3 IMPLANT
BINDER BREAST LRG (GAUZE/BANDAGES/DRESSINGS) IMPLANT
BINDER BREAST MEDIUM (GAUZE/BANDAGES/DRESSINGS) IMPLANT
BINDER BREAST XLRG (GAUZE/BANDAGES/DRESSINGS) IMPLANT
BINDER BREAST XXLRG (GAUZE/BANDAGES/DRESSINGS) ×1 IMPLANT
BLADE HEX COATED 2.75 (ELECTRODE) ×2 IMPLANT
BLADE SURG 10 STRL SS (BLADE) ×3 IMPLANT
BLADE SURG 11 STRL SS (BLADE) ×3 IMPLANT
BLADE SURG 15 STRL LF DISP TIS (BLADE) ×2 IMPLANT
BLADE SURG 15 STRL SS (BLADE) ×6
BNDG COHESIVE 4X5 TAN STRL (GAUZE/BANDAGES/DRESSINGS) ×3 IMPLANT
CANISTER SUC SOCK COL 7IN (MISCELLANEOUS) IMPLANT
CANISTER SUCT 1200ML W/VALVE (MISCELLANEOUS) ×3 IMPLANT
CHLORAPREP W/TINT 26ML (MISCELLANEOUS) ×3 IMPLANT
CLIP APPLIE 9.375 MED OPEN (MISCELLANEOUS) IMPLANT
CLIP TI LARGE 6 (CLIP) ×3 IMPLANT
CLIP TI MEDIUM 6 (CLIP) ×3 IMPLANT
CLIP TI WIDE RED SMALL 6 (CLIP) IMPLANT
COVER BACK TABLE 60X90IN (DRAPES) ×2 IMPLANT
COVER MAYO STAND STRL (DRAPES) ×3 IMPLANT
COVER PROBE W GEL 5X96 (DRAPES) ×3 IMPLANT
DECANTER SPIKE VIAL GLASS SM (MISCELLANEOUS) IMPLANT
DERMABOND ADVANCED (GAUZE/BANDAGES/DRESSINGS) ×2
DERMABOND ADVANCED .7 DNX12 (GAUZE/BANDAGES/DRESSINGS) ×2 IMPLANT
DEVICE DUBIN W/COMP PLATE 8390 (MISCELLANEOUS) ×4 IMPLANT
DRAPE C-ARM 42X72 X-RAY (DRAPES) ×3 IMPLANT
DRAPE LAPAROTOMY TRNSV 102X78 (DRAPE) ×3 IMPLANT
DRAPE UTILITY XL STRL (DRAPES) ×4 IMPLANT
DRSG PAD ABDOMINAL 8X10 ST (GAUZE/BANDAGES/DRESSINGS) IMPLANT
DRSG TEGADERM 4X4.75 (GAUZE/BANDAGES/DRESSINGS) IMPLANT
ELECT REM PT RETURN 9FT ADLT (ELECTROSURGICAL) ×3
ELECTRODE REM PT RTRN 9FT ADLT (ELECTROSURGICAL) ×2 IMPLANT
GLOVE BIO SURGEON STRL SZ 6 (GLOVE) ×4 IMPLANT
GLOVE BIOGEL PI IND STRL 6.5 (GLOVE) ×2 IMPLANT
GLOVE BIOGEL PI INDICATOR 6.5 (GLOVE) ×5
GOWN STRL REUS W/ TWL LRG LVL3 (GOWN DISPOSABLE) ×2 IMPLANT
GOWN STRL REUS W/TWL 2XL LVL3 (GOWN DISPOSABLE) ×4 IMPLANT
GOWN STRL REUS W/TWL LRG LVL3 (GOWN DISPOSABLE) ×6
ILLUMINATOR WAVEGUIDE N/F (MISCELLANEOUS) IMPLANT
KIT MARKER MARGIN INK (KITS) ×3 IMPLANT
KIT PORT POWER 8FR ISP CVUE (Catheter) ×1 IMPLANT
LIGHT WAVEGUIDE WIDE FLAT (MISCELLANEOUS) ×1 IMPLANT
NDL HYPO 25X1 1.5 SAFETY (NEEDLE) ×2 IMPLANT
NDL SAFETY ECLIPSE 18X1.5 (NEEDLE) IMPLANT
NEEDLE HYPO 18GX1.5 SHARP (NEEDLE) ×3
NEEDLE HYPO 25X1 1.5 SAFETY (NEEDLE) ×6 IMPLANT
NS IRRIG 1000ML POUR BTL (IV SOLUTION) ×1 IMPLANT
PACK BASIN DAY SURGERY FS (CUSTOM PROCEDURE TRAY) ×3 IMPLANT
PACK UNIVERSAL I (CUSTOM PROCEDURE TRAY) ×3 IMPLANT
PENCIL BUTTON HOLSTER BLD 10FT (ELECTRODE) ×3 IMPLANT
SLEEVE SCD COMPRESS KNEE MED (MISCELLANEOUS) ×3 IMPLANT
SPONGE GAUZE 4X4 12PLY STER LF (GAUZE/BANDAGES/DRESSINGS) ×3 IMPLANT
SPONGE LAP 18X18 X RAY DECT (DISPOSABLE) ×4 IMPLANT
STAPLER VISISTAT 35W (STAPLE) IMPLANT
STOCKINETTE IMPERVIOUS LG (DRAPES) ×3 IMPLANT
STRIP CLOSURE SKIN 1/2X4 (GAUZE/BANDAGES/DRESSINGS) ×3 IMPLANT
SUT ETHILON 2 0 FS 18 (SUTURE) IMPLANT
SUT MNCRL AB 4-0 PS2 18 (SUTURE) ×4 IMPLANT
SUT MON AB 5-0 PS2 18 (SUTURE) IMPLANT
SUT PROLENE 2 0 SH DA (SUTURE) ×6 IMPLANT
SUT SILK 2 0 SH (SUTURE) IMPLANT
SUT VIC AB 2-0 SH 27 (SUTURE) ×3
SUT VIC AB 2-0 SH 27XBRD (SUTURE) ×2 IMPLANT
SUT VIC AB 3-0 SH 27 (SUTURE) ×3
SUT VIC AB 3-0 SH 27X BRD (SUTURE) ×2 IMPLANT
SUT VIC AB 5-0 PS2 18 (SUTURE) IMPLANT
SUT VICRYL 3-0 CR8 SH (SUTURE) ×1 IMPLANT
SYR 10ML LL (SYRINGE) ×2 IMPLANT
SYR 5ML LUER SLIP (SYRINGE) ×4 IMPLANT
SYR CONTROL 10ML LL (SYRINGE) ×4 IMPLANT
TOWEL OR 17X24 6PK STRL BLUE (TOWEL DISPOSABLE) ×4 IMPLANT
TOWEL OR NON WOVEN STRL DISP B (DISPOSABLE) ×2 IMPLANT
TUBE CONNECTING 20X1/4 (TUBING) ×3 IMPLANT
YANKAUER SUCT BULB TIP NO VENT (SUCTIONS) ×1 IMPLANT

## 2016-09-06 NOTE — Interval H&P Note (Signed)
History and Physical Interval Note:  09/06/2016 12:57 PM  Stacy [name]  has presented today for surgery, with the diagnosis of LEFT BREAST CANCER  The various methods of treatment have been discussed with the patient and family. After consideration of risks, benefits and other options for treatment, the patient has consented to  Procedure(s): RADIOACTIVE SEED GUIDED LEFT BREAST LUMPECTOMY  WITH SENTINEL LYMPH NODE BIOPSY (Left) INSERTION PORT-A-CATH (N/A) as a surgical intervention .  The patient's history has been reviewed, patient examined, no change in status, stable for surgery.  I have reviewed the patient's chart and labs.  Questions were answered to the patient's satisfaction.     Lonnie Reth

## 2016-09-06 NOTE — Anesthesia Postprocedure Evaluation (Signed)
Anesthesia Post Note  Patient: EMBRI FAZEL  Procedure(s) Performed: Procedure(s) (LRB): RADIOACTIVE SEED GUIDED LEFT BREAST LUMPECTOMY  WITH SENTINEL LYMPH NODE BIOPSY (Left) INSERTION PORT-A-CATH (Right)  Patient location during evaluation: PACU Anesthesia Type: General Level of consciousness: awake and alert and oriented Pain management: pain level controlled Vital Signs Assessment: post-procedure vital signs reviewed and stable Respiratory status: spontaneous breathing, nonlabored ventilation and respiratory function stable Cardiovascular status: blood pressure returned to baseline and stable Postop Assessment: no signs of nausea or vomiting Anesthetic complications: no       Last Vitals:  Vitals:   09/06/16 1645 09/06/16 1700  BP: (!) 164/105 (!) 151/98  Pulse: 89 84  Resp: 18 19  Temp:      Last Pain:  Vitals:   09/06/16 1653  TempSrc:   PainSc: 6                  Richards Pherigo A.

## 2016-09-06 NOTE — Anesthesia Preprocedure Evaluation (Signed)
Anesthesia Evaluation  Patient identified by MRN, date of birth, ID band Patient awake    Reviewed: Allergy & Precautions, NPO status , Patient's Chart, lab work & pertinent test results  History of Anesthesia Complications (+) history of anesthetic complications  Airway Mallampati: III  TM Distance: >3 FB Neck ROM: Full    Dental  (+) Chipped,    Pulmonary Current Smoker,    Pulmonary exam normal breath sounds clear to auscultation       Cardiovascular hypertension, Pt. on medications + Valvular Problems/Murmurs MR  Rhythm:Regular Rate:Normal + Systolic murmurs    Neuro/Psych  Headaches, PSYCHIATRIC DISORDERS Anxiety Depression    GI/Hepatic negative GI ROS, Neg liver ROS,   Endo/Other  Obesity  Renal/GU negative Renal ROS  negative genitourinary   Musculoskeletal  (+) Arthritis , Osteoarthritis,  Fibromyalgia -  Abdominal (+) + obese,   Peds  Hematology   Anesthesia Other Findings   Reproductive/Obstetrics negative OB ROS                             Lab Results  Component Value Date   WBC 11.7 (H) 08/29/2016   HGB 14.9 08/29/2016   HCT 44.7 08/29/2016   MCV 88.5 08/29/2016   PLT 272 08/29/2016     Chemistry      Component Value Date/Time   NA 141 08/29/2016 0831   K 4.3 08/29/2016 0831   CL 99 04/20/2016 1417   CO2 27 08/29/2016 0831   BUN 18.6 08/29/2016 0831   CREATININE 0.9 08/29/2016 0831      Component Value Date/Time   CALCIUM 10.1 08/29/2016 0831   ALKPHOS 72 08/29/2016 0831   AST 19 08/29/2016 0831   ALT 21 08/29/2016 0831   BILITOT 0.31 08/29/2016 0831      Anesthesia Physical Anesthesia Plan  ASA: II  Anesthesia Plan: General and Regional   Post-op Pain Management:  Regional for Post-op pain   Induction: Intravenous  Airway Management Planned: LMA  Additional Equipment:   Intra-op Plan:   Post-operative Plan: Extubation in OR  Informed  Consent: I have reviewed the patients History and Physical, chart, labs and discussed the procedure including the risks, benefits and alternatives for the proposed anesthesia with the patient or authorized representative who has indicated his/her understanding and acceptance.   Dental advisory given  Plan Discussed with: Anesthesiologist, CRNA and Surgeon  Anesthesia Plan Comments:         Anesthesia Quick Evaluation

## 2016-09-06 NOTE — Progress Notes (Signed)
  Echocardiogram 2D Echocardiogram has been performed.  Darlina Sicilian M 09/06/2016, 8:42 AM

## 2016-09-06 NOTE — Op Note (Signed)
Left Breast Radioactive seed localized lumpectomy and sentinel lymph node biopsy, port placement  Indications: This patient presents with history of left breast cancer, [id number], upper outer quadrant, +/+/+  Pre-operative Diagnosis: left breast cancer  Post-operative Diagnosis: Same  Surgeon: [name]   Anesthesia: General endotracheal anesthesia  ASA Class: 2  Procedure Details  The patient was seen in the Holding Room. The risks, benefits, complications, treatment options, and expected outcomes were discussed with the patient. The possibilities of bleeding, infection, the need for additional procedures, failure to diagnose a condition, and creating a complication requiring transfusion or operation were discussed with the patient. The patient concurred with the proposed plan, giving informed consent.  The site of surgery properly noted/marked. The patient was taken to Operating Room # 8, identified, and the procedure verified as Left Breast seed localized Lumpectomy with sentinel lymph node biopsy and port placement. A Time Out was held and the above information confirmed.  The bilateral breast and chest in addition to the left arm were prepped and draped in standard fashion.    Local anesthetic was administered at the angle of the clavicle.  The vein was accessed with 2 passes of the needle. There was good venous return and the wire passed easily with no ectopy.  Fluoroscopy was used to confirm that the wire was in the vena cava.      The patient was placed back level and the area for the pocket was anethetized   with local anesthetic.  A 3-cm transverse incision was made with a #15   blade.  Cautery was used to divide the subcutaneous tissues down to the   pectoralis muscle.  An [employer] retractor was used to elevate the skin   while a pocket was created on top of the pectoralis fascia.  The port   was placed into the pocket to confirm that it was of adequate size.  The   catheter  was preattached to the port.  The port was then secured to the   pectoralis fascia with four 2-0 Prolene sutures.  These were clamped and   not tied down yet.    The catheter was tunneled through to the wire exit   site.  The catheter was placed along the wire to determine what length it should be to be in the SVC.  The catheter was cut at 18.5 cm.  The tunneler sheath and dilator were passed over the wire and the dilator and wire were removed.  The catheter was advanced through the tunneler sheath and the tunneler sheath was pulled away.  Care was taken to keep the catheter in the tunneler sheath as this occurred. This was advanced and the tunneler sheath was removed.  There was good venous  return and easy flush of the catheter.  The Prolene sutures were tied   down to the pectoral fascia.  The skin was reapproximated using 3-0   Vicryl interrupted deep dermal sutures.    Fluoroscopy was used to re-confirm good position of the catheter.  The skin   was then closed using 4-0 Monocryl in a subcuticular fashion.  The port was flushed with concentrated heparin flush as well.  The wounds were then cleaned, dried, and dressed with Dermabond.   The lumpectomy was performed by creating an transverse incision over the upper outer quadrant of the breast over the previously placed radioactive seed.  Dissection was carried down to around the point of maximum signal intensity. The cautery was used to perform the  dissection.  Hemostasis was achieved with cautery. The edges of the cavity were marked with large clips, with one each medial, lateral, inferior and superior, and two clips posteriorly.   The specimen was inked with the margin marker paint kit.    Specimen radiography confirmed inclusion of the mammographic lesion and the seed.  The clip was not in the specimen.  Additional medial, anterior, and lateral margins were taken.  The clip was still not found.  The background signal in the breast was zero.  The  wound was irrigated and closed with 3-0 vicryl in layers and 4-0 monocryl subcuticular suture.    Using a hand-held gamma probe, left axillary sentinel nodes were identified transcutaneously.  An oblique incision was created below the axillary hairline.  Dissection was carried through the clavipectoral fascia.  Two level 2 axillary sentinel nodes were removed.  Counts per second were 180 and 15.    The background count was 0 cps.  The wound was irrigated.  Hemostasis was achieved with cautery.  The axillary incision was closed with a 3-0 vicryl deep dermal interrupted sutures and a 4-0 monocryl subcuticular closure.    Sterile dressings were applied. At the end of the operation, all sponge, instrument, and needle counts were correct.  Findings: grossly clear surgical margins and no adenopathy.  Posterior margin is pectoralis.    Estimated Blood Loss:  min         Specimens: left breast lumpectomy, additional anterior, lateral, and medial margins and two axillary sentinel lymph nodes.             Complications:  None; patient tolerated the procedure well.         Disposition: PACU - hemodynamically stable.         Condition: stable

## 2016-09-06 NOTE — Transfer of Care (Signed)
Immediate Anesthesia Transfer of Care Note  Patient: Stacy [name]  Procedure(s) Performed: Procedure(s): RADIOACTIVE SEED GUIDED LEFT BREAST LUMPECTOMY  WITH SENTINEL LYMPH NODE BIOPSY (Left) INSERTION PORT-A-CATH (Right)  Patient Location: PACU  Anesthesia Type:General and GA combined with regional for post-op pain  Level of Consciousness: awake and alert   Airway & Oxygen Therapy: Patient Spontanous Breathing and Patient connected to face mask oxygen  Post-op Assessment: Report given to RN and Post -op Vital signs reviewed and stable  Post vital signs: Reviewed and stable  Last Vitals:  Vitals:   09/06/16 1255 09/06/16 1300  BP:  117/85  Pulse: 74 79  Resp: 15 14  Temp:      Last Pain:  Vitals:   09/06/16 1129  TempSrc: Oral         Complications: No apparent anesthesia complications

## 2016-09-06 NOTE — Telephone Encounter (Signed)
Left pt message to make NP appt w/ echo

## 2016-09-06 NOTE — Anesthesia Procedure Notes (Signed)
Procedure Name: LMA Insertion Date/Time: 09/06/2016 1:48 PM Performed by: Melynda Ripple D Pre-anesthesia Checklist: Patient identified, Emergency Drugs available, Suction available and Patient being monitored Patient Re-evaluated:Patient Re-evaluated prior to inductionOxygen Delivery Method: Circle system utilized Preoxygenation: Pre-oxygenation with 100% oxygen Intubation Type: IV induction Ventilation: Mask ventilation without difficulty LMA: LMA inserted LMA Size: 4.0 Number of attempts: 1 Airway Equipment and Method: Bite block Placement Confirmation: positive ETCO2 Tube secured with: Tape Dental Injury: Teeth and Oropharynx as per pre-operative assessment

## 2016-09-06 NOTE — Progress Notes (Signed)
Assisted Dr. Royce Macadamia with left, ultrasound guided, pectoralis block. Side rails up, monitors on throughout procedure. See vital signs in flow sheet. Tolerated procedure well

## 2016-09-06 NOTE — Discharge Instructions (Signed)
[facility] °Office Phone Number [phone] ° °BREAST BIOPSY/ PARTIAL MASTECTOMY: POST OP INSTRUCTIONS ° °Always review your discharge instruction sheet given to you by the facility where your surgery was performed. ° °IF YOU HAVE DISABILITY OR FAMILY LEAVE FORMS, YOU MUST BRING THEM TO THE OFFICE FOR PROCESSING.  DO NOT GIVE THEM TO YOUR DOCTOR. ° °1. A prescription for pain medication may be given to you upon discharge.  Take your pain medication as prescribed, if needed.  If narcotic pain medicine is not needed, then you may take acetaminophen (Tylenol) or ibuprofen (Advil) as needed. °2. Take your usually prescribed medications unless otherwise directed °3. If you need a refill on your pain medication, please contact your pharmacy.  They will contact our office to request authorization.  Prescriptions will not be filled after 5pm or on week-ends. °4. You should eat very light the first 24 hours after surgery, such as soup, crackers, pudding, etc.  Resume your normal diet the day after surgery. °5. Most patients will experience some swelling and bruising in the breast.  Ice packs and a good support bra will help.  Swelling and bruising can take several days to resolve.  °6. It is common to experience some constipation if taking pain medication after surgery.  Increasing fluid intake and taking a stool softener will usually help or prevent this problem from occurring.  A mild laxative (Milk of Magnesia or Miralax) should be taken according to package directions if there are no bowel movements after 48 hours. °7. Unless discharge instructions indicate otherwise, you may remove your bandages 48 hours after surgery, and you may shower at that time.  You may have steri-strips (small skin tapes) in place directly over the incision.  These strips should be left on the skin for 7-10 days.   Any sutures or staples will be removed at the office during your follow-up visit. °8. ACTIVITIES:  You may resume  regular daily activities (gradually increasing) beginning the next day.  Wearing a good support bra or sports bra (or the breast binder) minimizes pain and swelling.  You may have sexual intercourse when it is comfortable. °a. You may drive when you no longer are taking prescription pain medication, you can comfortably wear a seatbelt, and you can safely maneuver your car and apply brakes. °b. RETURN TO WORK:  __________1 week_______________ °9. You should see your doctor in the office for a follow-up appointment approximately two weeks after your surgery.  Your doctor’s nurse will typically make your follow-up appointment when she calls you with your pathology report.  Expect your pathology report 2-3 business days after your surgery.  You may call to check if you do not hear from us after three days. ° ° °WHEN TO CALL YOUR DOCTOR: °1. Fever over 101.0 °2. Nausea and/or vomiting. °3. Extreme swelling or bruising. °4. Continued bleeding from incision. °5. Increased pain, redness, or drainage from the incision. ° °The clinic staff is available to answer your questions during regular business hours.  Please don’t hesitate to call and ask to speak to one of the nurses for clinical concerns.  If you have a medical emergency, go to the nearest emergency room or call 911.  A surgeon from [facility] is always on call at the hospital. ° °For further questions, please visit [email or web address]  ° °

## 2016-09-06 NOTE — Anesthesia Procedure Notes (Signed)
Anesthesia Regional Block:  Pectoralis block  Pre-Anesthetic Checklist: ,, timeout performed, Correct Patient, Correct Site, Correct Laterality, Correct Procedure, Correct Position, site marked, Risks and benefits discussed,  Surgical consent,  Pre-op evaluation,  At surgeon's request and post-op pain management  Laterality: Left  Prep: chloraprep       Needles:  Injection technique: Single-shot  Needle Type: Echogenic Stimulator Needle     Needle Length: 9cm 9 cm Needle Gauge: 21 and 21 G  Needle insertion depth: 7 cm   Additional Needles: Pectoralis block Narrative:  Start time: 09/06/2016 12:40 PM End time: 09/06/2016 12:50 PM Injection made incrementally with aspirations every 5 mL.  Performed by: Personally  Anesthesiologist: Josephine Igo  Additional Notes: Patient tolerated procedure well.

## 2016-09-11 NOTE — Progress Notes (Signed)
Cancer is out and margins are negative. LN negative.  We suspect the clip came out during surgery on the sponges, but would like to get mammogram to make sure that if the clip is still present, that it does not have concerning findings at the site of the clip.  Sometimes the clip can migrate.

## 2016-09-12 ENCOUNTER — Other Ambulatory Visit: Payer: Self-pay | Admitting: General Surgery

## 2016-09-12 ENCOUNTER — Encounter: Payer: Self-pay | Admitting: *Deleted

## 2016-09-12 ENCOUNTER — Encounter: Payer: Self-pay | Admitting: Hematology and Oncology

## 2016-09-12 ENCOUNTER — Other Ambulatory Visit: Payer: BLUE CROSS/BLUE SHIELD

## 2016-09-12 DIAGNOSIS — C50412 Malignant neoplasm of upper-outer quadrant of left female breast: Secondary | ICD-10-CM

## 2016-09-12 NOTE — Progress Notes (Signed)
Spoke with patient to advise that FMLA Paperwork was faxed on 09/06/16 to Benefits Administration. Requested that it be faxed again as they stated that they never received it which I did today 09/12/16. Also verified that son, Chirsty Bartmess FMLA paperwork was complete and ready for p/u at receptionist desk

## 2016-09-12 NOTE — Progress Notes (Signed)
Called patient whom has financial concerns regarding rent. Patient will be starting chemo. Advised patient of [employer] grant and what is needed to apply as well as expenses grant can be used for. Patient will contact her landlord to provide detailed information and call me back tomorrow with the information I need. Patient was given my contact name and number. Patient verbalized understanding.

## 2016-09-14 ENCOUNTER — Ambulatory Visit: Payer: BLUE CROSS/BLUE SHIELD | Admitting: Neurology

## 2016-09-17 NOTE — Progress Notes (Signed)
Patient Care Team: [name], NP as PCP - General (Internal Medicine) [name], MD as Consulting Physician (General Surgery) Nicholas Lose, MD as Consulting Physician (Hematology and Oncology) Gery Pray, MD as Consulting Physician (Radiation Oncology)  DIAGNOSIS:  Encounter Diagnosis  Name Primary?  . Malignant neoplasm of upper-outer quadrant of left breast in female, estrogen receptor positive ([name])     SUMMARY OF ONCOLOGIC HISTORY:   Malignant neoplasm of upper-outer quadrant of left female breast ([name])   08/21/2016 Initial Diagnosis    Screening detected left breast mass UOQ at 1:00: 1.2 x 1 x 0.5 cm, axilla negative Left breast biopsy 1:00: IDC with DCIS, grade 1, ER 90%, PR 70%, Ki-67 30%, HER-2 positive ratio 2.43, T1c N0 stage IA clinical stage      09/06/2016 Surgery    Left lumpectomy: IDC grade 2, 3.4 cm, Margins Neg, 0/1 LN neg, ER 90%, PR 70%, Ki-67 30%, HER-2 positive ratio 2.43 T2N0 (stage 2 A)       CHIEF COMPLIANT: Follow up after surgery  INTERVAL HISTORY: Stacy [name] is a 56 yr old with the abpve history of recent lumpectomy for ER and Her 2 Positive Breast cancer. Shes recovering well from surgery. Complaining of cough and sinus discharge  REVIEW OF SYSTEMS:   Constitutional: Denies fevers, chills or abnormal weight loss Eyes: Denies blurriness of vision Ears, nose, mouth, throat, and face: Denies mucositis or sore throat Respiratory: Denies cough, dyspnea or wheezes Cardiovascular: Denies palpitation, chest discomfort Gastrointestinal:  Denies nausea, heartburn or change in bowel habits Skin: Denies abnormal skin rashes Lymphatics: Denies new lymphadenopathy or easy bruising Neurological:Denies numbness, tingling or new weaknesses Behavioral/Psych: Mood is stable, no new changes  Extremities: No lower extremity edema Breast: Rt Lumpectomy All other systems were reviewed with the patient and are negative.  I have reviewed the past  medical history, past surgical history, social history and family history with the patient and they are unchanged from previous note.  ALLERGIES:  is allergic to naproxen; erythromycin; tramadol hcl; and gabapentin.  MEDICATIONS:  Current Outpatient Prescriptions  Medication Sig Dispense Refill  . azithromycin (ZITHROMAX Z-PAK) 250 MG tablet Use as directed (Z-Pak) 6 each 0  . Calcium Carbonate-Vitamin D3 (CALCIUM 600/VITAMIN D) 600-400 MG-UNIT TABS Take 1 tablet by mouth daily.    . hydrochlorothiazide (HYDRODIURIL) 25 MG tablet TAKE 1 TABLET BY MOUTH EVERY DAY 30 tablet 5  . HYDROcodone-homatropine (HYCODAN) 5-1.5 MG/5ML syrup Take 5 mLs by mouth every 6 (six) hours as needed for cough. 120 mL 0  . ibuprofen (ADVIL,MOTRIN) 200 MG tablet Take 800 mg by mouth every 4 (four) hours as needed.      No current facility-administered medications for this visit.     PHYSICAL EXAMINATION: ECOG PERFORMANCE STATUS: 1  Vitals:   09/18/16 1456  BP: (!) 145/93  Pulse: 76  Resp: 18  Temp: 97.4 F (36.3 C)   Filed Weights   09/18/16 1456  Weight: 229 lb 8 oz (104.1 kg)    GENERAL:alert, no distress and comfortable SKIN: skin color, texture, turgor are normal, no rashes or significant lesions EYES: normal, Conjunctiva are pink and non-injected, sclera clear OROPHARYNX:no exudate, no erythema and lips, buccal mucosa, and tongue normal  NECK: supple, thyroid normal size, non-tender, without nodularity LYMPH:  no palpable lymphadenopathy in the cervical, axillary or inguinal LUNGS: clear to auscultation and percussion with normal breathing effort HEART: regular rate & rhythm and no murmurs and no lower extremity edema ABDOMEN:abdomen soft,  non-tender and normal bowel sounds MUSCULOSKELETAL:no cyanosis of digits and no clubbing  NEURO: alert & oriented x 3 with fluent speech, no focal motor/sensory deficits EXTREMITIES: No lower extremity edema  LABORATORY DATA:  I have reviewed the data as  listed   Chemistry      Component Value Date/Time   NA 141 08/29/2016 0831   K 4.3 08/29/2016 0831   CL 99 04/20/2016 1417   CO2 27 08/29/2016 0831   BUN 18.6 08/29/2016 0831   CREATININE 0.9 08/29/2016 0831      Component Value Date/Time   CALCIUM 10.1 08/29/2016 0831   ALKPHOS 72 08/29/2016 0831   AST 19 08/29/2016 0831   ALT 21 08/29/2016 0831   BILITOT 0.31 08/29/2016 0831       Lab Results  Component Value Date   WBC 11.7 (H) 08/29/2016   HGB 14.9 08/29/2016   HCT 44.7 08/29/2016   MCV 88.5 08/29/2016   PLT 272 08/29/2016   NEUTROABS 7.8 (H) 08/29/2016    ASSESSMENT & PLAN:  Malignant neoplasm of upper-outer quadrant of left female breast ([name]) Left lumpectomy 09/06/16: IDC grade 2, 3.4 cm, Margins Neg, 0/1 LN neg, T2N0  ER 90%, PR 70%, Ki-67 30%, HER-2 positive ratio 2.43 (stage 2 A)  Pathology counseling: I discussed the final pathology report of the patient provided  a copy of this report. I discussed the margins as well as lymph node surgeries. We also discussed the final staging along with previously performed ER/PR and HER-2/neu testing.  Treatment Plan: 1. adjuvant chemotherapy with Herceptin (discussed TCHP followed by Herceptin-Perjeta maintenance to complete 1 year. 2. Adjuvant radiation therapy followed by 3. Adjuvant antiestrogen therapy  Chemotherapy Counseling: I discussed the risks and benefits of chemotherapy including the risks of nausea/ vomiting, risk of infection from low WBC count, fatigue due to chemo or anemia, bruising or bleeding due to low platelets, mouth sores, loss/ change in taste and decreased appetite. Liver and kidney function will be monitored through out chemotherapy as abnormalities in liver and kidney function may be a side effect of treatment. Cardiac dysfunction due to Herceptin and Perjeta were discussed in detail. Risk of permanent bone marrow dysfunction and leukemia due to chemo were also discussed. Risk of diarrhea due to  Perjeta and how to take imodium were also discussed  RTC to start chemo  No orders of the defined types were placed in this encounter.  The patient has a good understanding of the overall plan. she agrees with it. she will call with any problems that may develop before the next visit here.   Rulon Eisenmenger, MD 09/18/16

## 2016-09-17 NOTE — Assessment & Plan Note (Signed)
Left lumpectomy 09/06/16: IDC grade 2, 3.4 cm, Margins Neg, 0/1 LN neg, T2N0  ER 90%, PR 70%, Ki-67 30%, HER-2 positive ratio 2.43 (stage 2 A)  Pathology counseling: I discussed the final pathology report of the patient provided  a copy of this report. I discussed the margins as well as lymph node surgeries. We also discussed the final staging along with previously performed ER/PR and HER-2/neu testing.  Treatment Plan: 1. adjuvant chemotherapy with Herceptin (discussed Taxol and Herceptin weekly 12 followed by Herceptin maintenance to complete 1 year) 2. Adjuvant radiation therapy followed by 3. Adjuvant antiestrogen therapy  RTC to start chemo

## 2016-09-18 ENCOUNTER — Ambulatory Visit (HOSPITAL_BASED_OUTPATIENT_CLINIC_OR_DEPARTMENT_OTHER): Payer: BLUE CROSS/BLUE SHIELD | Admitting: Hematology and Oncology

## 2016-09-18 ENCOUNTER — Encounter: Payer: Self-pay | Admitting: Hematology and Oncology

## 2016-09-18 ENCOUNTER — Other Ambulatory Visit (HOSPITAL_COMMUNITY): Payer: BLUE CROSS/BLUE SHIELD

## 2016-09-18 DIAGNOSIS — Z170 Estrogen receptor positive status [ER+]: Secondary | ICD-10-CM | POA: Diagnosis not present

## 2016-09-18 DIAGNOSIS — C50412 Malignant neoplasm of upper-outer quadrant of left female breast: Secondary | ICD-10-CM

## 2016-09-18 MED ORDER — HYDROCODONE-HOMATROPINE 5-1.5 MG/5ML PO SYRP: 5.0000 mL | ORAL_SOLUTION | Freq: Four times a day (QID) | ORAL | 0 refills | Status: DC | PRN

## 2016-09-18 MED ORDER — AZITHROMYCIN 250 MG PO TABS: ORAL_TABLET | ORAL | 0 refills | Status: DC

## 2016-09-18 NOTE — Progress Notes (Signed)
Patient called and states she was able to work everything out with her landlord and does not need rent assistance this month but would still like to apply for grant. Advised patient to bring proof of income and to meet with me today after appointment.

## 2016-09-18 NOTE — Progress Notes (Signed)
Met with patient whom had financial concerns. Patient brought in proof of income. Patient approved for one-time [employer] grant $1,000. Patient has a copy of award letter as well as expenses it covers along with the outpatient pharmacy information.  Asked patient about insurance and co-insurance. Patient had a written copy of her treatment plan from the doctor. Asked patient if she would like to apply for co-pay assistance through [place] for Herceptin and Perjeta. Patient states yes. Completed application online. Patient approved. Gave patient a copy of paper approval and advised one would be sent in mail. Patient approved for $25,000 for Herceptin 09/18/2016-09/17/2017. Patient also approved for $25,000 for Perjeta 09/18/2016-09/18/2017. A copy emailed to billing as well as placed in box for HIM to scan.  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 Patient approved. Advised patient how this card works as well. Faxed signed patient agreement to [employer]. Fax received ok per confirmation sheet.  Patient very appreciative. Gave patient my card for any additional financial questions or concerns.

## 2016-09-19 ENCOUNTER — Ambulatory Visit
Admission: RE | Admit: 2016-09-19 | Discharge: 2016-09-19 | Disposition: A | Discharge status: Home / Self Care | Payer: BLUE CROSS/BLUE SHIELD | Source: Ambulatory Visit | Attending: General Surgery | Admitting: General Surgery

## 2016-09-19 ENCOUNTER — Encounter: Payer: Self-pay | Admitting: Neurology

## 2016-09-19 ENCOUNTER — Other Ambulatory Visit: Payer: Self-pay | Admitting: General Surgery

## 2016-09-19 DIAGNOSIS — C50412 Malignant neoplasm of upper-outer quadrant of left female breast: Secondary | ICD-10-CM

## 2016-09-19 DIAGNOSIS — R928 Other abnormal and inconclusive findings on diagnostic imaging of breast: Secondary | ICD-10-CM | POA: Diagnosis not present

## 2016-09-20 ENCOUNTER — Ambulatory Visit: Payer: BLUE CROSS/BLUE SHIELD | Admitting: Neurology

## 2016-09-25 ENCOUNTER — Encounter: Payer: Self-pay | Admitting: Hematology and Oncology

## 2016-09-25 NOTE — Progress Notes (Signed)
Spoke to patient to advise that FMLA paperwork was faxed to [phone]

## 2016-09-26 ENCOUNTER — Other Ambulatory Visit: Payer: Self-pay | Admitting: Hematology and Oncology

## 2016-09-26 DIAGNOSIS — C50412 Malignant neoplasm of upper-outer quadrant of left female breast: Secondary | ICD-10-CM

## 2016-09-26 DIAGNOSIS — Z170 Estrogen receptor positive status [ER+]: Principal | ICD-10-CM

## 2016-09-26 MED ORDER — ONDANSETRON HCL 8 MG PO TABS: 8.0000 mg | ORAL_TABLET | Freq: Two times a day (BID) | ORAL | 1 refills | Status: DC | PRN

## 2016-09-26 MED ORDER — LORAZEPAM 0.5 MG PO TABS: 0.5000 mg | ORAL_TABLET | Freq: Four times a day (QID) | ORAL | 0 refills | Status: DC | PRN

## 2016-09-26 MED ORDER — PROCHLORPERAZINE MALEATE 10 MG PO TABS: 10.0000 mg | ORAL_TABLET | Freq: Four times a day (QID) | ORAL | 1 refills | Status: DC | PRN

## 2016-09-26 MED ORDER — DEXAMETHASONE 4 MG PO TABS: 4.0000 mg | ORAL_TABLET | Freq: Every day | ORAL | 1 refills | Status: DC

## 2016-09-26 MED ORDER — LIDOCAINE-PRILOCAINE 2.5-2.5 % EX CREA: TOPICAL_CREAM | CUTANEOUS | 3 refills | Status: DC

## 2016-09-26 NOTE — Progress Notes (Signed)
START OFF PATHWAY REGIMEN - Breast  Off Pathway: Docetaxel  + Carboplatin + Trastuzumab + Pertuzumab (TCHP) q21 Days  [id number]:Docetaxel  + Carboplatin + Trastuzumab + Pertuzumab (TCHP) q21 Days:   A cycle is every 21 days:     Pertuzumab (Perjeta(R)) 840 mg flat dose IV in 250 mL NS over 60 minutes as a loading dose cycle 1 only.  See observation note below. Dose Mod: None     Pertuzumab (Perjeta(R)) 420 mg flat dose IV in 250 mL NS over 30 minutes as maintenance dose cycles 2 and beyond. An observation period of 30-60 minutes is recommended after each dose prior to subsequent infusion of trastuzumab or docetaxel. Dose Mod: None     Trastuzumab (Herceptin(R)) 8 mg/kg in 250 mL NS IV over 90 minutes as a loading dose cycle 1 only. Dose Mod: None     Trastuzumab (Herceptin(R)) 6 mg/kg in 250 mL NS IV over 30 minutes as maintenance dose cycle 2 and beyond. Dose Mod: None     Carboplatin (Paraplatin(R)) AUC=6 in a total of 250 mL NS IV over 1 hour. Cited: TRYPHAENA trial gave carboplatin prior to docetaxel. Dose Mod: None     Docetaxel (Taxotere(R)) 75 mg/m2 in 250 mL NS IV over 1 hour.  Docetaxel should be given after pertuzumab and trastuzumab Dose Mod: None Additional Orders: Premedicate with dexamethasone 8 mg PO bid for three days beginning 1 day prior to docetaxel therapy. Recommended monitoring: LVEF baseline and q3-4 months or with the development of cardiac symptoms and regimen changes for metastatic treatment. Schneeweiss, et al. Ann Oncol. 2013 Sep;24(9):2278-84  **Always confirm dose/schedule in your pharmacy ordering system**    Patient Characteristics: Adjuvant Therapy, Node Negative, HER2/neu Positive, ER Positive, Stage IIa and Above AJCC Stage Grouping: IIA Current Disease Status: No Distant Mets or Local Recurrence AJCC M Stage: 0 ER Status: Positive (+) AJCC N Stage: 0 AJCC T Stage: 2 HER2/neu: Positive (+) PR Status: Positive (+) Node Status: Negative  (-)  Intent of Therapy: Curative Intent, Discussed with Patient 

## 2016-09-27 MED FILL — DEXAMETHASONE 4 MG TABLET: 4 | 30 days supply | Qty: 30 | Fill #0

## 2016-09-27 MED FILL — PROCHLORPERAZINE 10 MG TAB: 10 | 8 days supply | Qty: 30 | Fill #0

## 2016-09-27 MED FILL — LORazepam 0.5 MG TABS: 0.5 | 8 days supply | Qty: 30 | Fill #0

## 2016-09-27 MED FILL — LIDOCAINE-PRILOCAINE CREAM: 2.5-2.5 | 20 days supply | Qty: 30 | Fill #0

## 2016-09-27 MED FILL — ONDANSETRON HCL 8 MG TABLET: 8 | 15 days supply | Qty: 30 | Fill #0

## 2016-10-04 ENCOUNTER — Other Ambulatory Visit: Payer: Self-pay | Admitting: Hematology and Oncology

## 2016-10-08 NOTE — Assessment & Plan Note (Signed)
Left lumpectomy 09/06/16: IDC grade 2, 3.4 cm, Margins Neg, 0/1 LN neg, T2N0  ER 90%, PR 70%, Ki-67 30%, HER-2 positive ratio 2.43 (stage 2 A)  Treatment Plan: 1. adjuvant chemotherapy with Herceptin (discussed TCHP followed by Herceptin-Perjeta maintenance to complete 1 year. 2. Adjuvant radiation therapy followed by 3. Adjuvant antiestrogen therapy ----------------------------------------------------------------------------- Current Treatment: TCHP cycle 1 day 1 Antiemetics were reviewed Chemotherapy consent obtained Chemotherapy education completed Echocardiogram 06/29/2016: EF 55-60% Closely monitoring for chemotherapy toxicities. Return to clinic in one week for toxicity check

## 2016-10-09 ENCOUNTER — Ambulatory Visit: Payer: BLUE CROSS/BLUE SHIELD

## 2016-10-09 ENCOUNTER — Encounter: Payer: Self-pay | Admitting: *Deleted

## 2016-10-09 ENCOUNTER — Ambulatory Visit (HOSPITAL_BASED_OUTPATIENT_CLINIC_OR_DEPARTMENT_OTHER): Payer: BLUE CROSS/BLUE SHIELD | Admitting: Hematology and Oncology

## 2016-10-09 ENCOUNTER — Other Ambulatory Visit (HOSPITAL_BASED_OUTPATIENT_CLINIC_OR_DEPARTMENT_OTHER): Payer: BLUE CROSS/BLUE SHIELD

## 2016-10-09 ENCOUNTER — Encounter: Payer: Self-pay | Admitting: Hematology and Oncology

## 2016-10-09 ENCOUNTER — Encounter: Payer: Self-pay | Admitting: Nurse Practitioner

## 2016-10-09 ENCOUNTER — Ambulatory Visit (HOSPITAL_BASED_OUTPATIENT_CLINIC_OR_DEPARTMENT_OTHER): Payer: BLUE CROSS/BLUE SHIELD | Admitting: Nurse Practitioner

## 2016-10-09 ENCOUNTER — Ambulatory Visit (HOSPITAL_BASED_OUTPATIENT_CLINIC_OR_DEPARTMENT_OTHER): Payer: BLUE CROSS/BLUE SHIELD

## 2016-10-09 VITALS — BP 143/93 | HR 80 | Temp 97.7°F | Resp 16

## 2016-10-09 DIAGNOSIS — Z170 Estrogen receptor positive status [ER+]: Principal | ICD-10-CM

## 2016-10-09 DIAGNOSIS — C50412 Malignant neoplasm of upper-outer quadrant of left female breast: Secondary | ICD-10-CM

## 2016-10-09 DIAGNOSIS — K219 Gastro-esophageal reflux disease without esophagitis: Secondary | ICD-10-CM | POA: Insufficient documentation

## 2016-10-09 DIAGNOSIS — Z5112 Encounter for antineoplastic immunotherapy: Secondary | ICD-10-CM

## 2016-10-09 DIAGNOSIS — Z5189 Encounter for other specified aftercare: Secondary | ICD-10-CM | POA: Diagnosis not present

## 2016-10-09 DIAGNOSIS — Z95828 Presence of other vascular implants and grafts: Secondary | ICD-10-CM | POA: Insufficient documentation

## 2016-10-09 DIAGNOSIS — Z5111 Encounter for antineoplastic chemotherapy: Secondary | ICD-10-CM

## 2016-10-09 LAB — COMPREHENSIVE METABOLIC PANEL
ALBUMIN: 3.9 g/dL (ref 3.5–5.0)
ALK PHOS: 67 U/L (ref 40–150)
ALT: 13 U/L (ref 0–55)
AST: 15 U/L (ref 5–34)
Anion Gap: 12 mEq/L — ABNORMAL HIGH (ref 3–11)
BUN: 22.7 mg/dL (ref 7.0–26.0)
CALCIUM: 9.7 mg/dL (ref 8.4–10.4)
CO2: 21 mEq/L — ABNORMAL LOW (ref 22–29)
Chloride: 104 mEq/L (ref 98–109)
Creatinine: 0.9 mg/dL (ref 0.6–1.1)
EGFR: 75 mL/min/{1.73_m2} — AB (ref >90)
GLUCOSE: 98 mg/dL (ref 70–140)
POTASSIUM: 3.4 meq/L — AB (ref 3.5–5.1)
SODIUM: 136 meq/L (ref 136–145)
Total Bilirubin: 0.42 mg/dL (ref 0.20–1.20)
Total Protein: 7.5 g/dL (ref 6.4–8.3)

## 2016-10-09 LAB — CBC WITH DIFFERENTIAL/PLATELET
BASO%: 0.7 % (ref 0.0–2.0)
BASOS ABS: 0.1 10*3/uL (ref 0.0–0.1)
EOS ABS: 0.1 10*3/uL (ref 0.0–0.5)
EOS%: 0.6 % (ref 0.0–7.0)
HCT: 39.4 % (ref 34.8–46.6)
HEMOGLOBIN: 13.6 g/dL (ref 11.6–15.9)
LYMPH%: 16.5 % (ref 14.0–49.7)
MCH: 30.2 pg (ref 25.1–34.0)
MCHC: 34.4 g/dL (ref 31.5–36.0)
MCV: 87.6 fL (ref 79.5–101.0)
MONO#: 1.1 10*3/uL — ABNORMAL HIGH (ref 0.1–0.9)
MONO%: 6.8 % (ref 0.0–14.0)
NEUT#: 12.6 10*3/uL — ABNORMAL HIGH (ref 1.5–6.5)
NEUT%: 75.4 % (ref 38.4–76.8)
Platelets: 312 10*3/uL (ref 145–400)
RBC: 4.5 10*6/uL (ref 3.70–5.45)
RDW: 13.5 % (ref 11.2–14.5)
WBC: 16.7 10*3/uL — ABNORMAL HIGH (ref 3.9–10.3)
lymph#: 2.8 10*3/uL (ref 0.9–3.3)

## 2016-10-09 MED ORDER — DIPHENHYDRAMINE HCL 25 MG PO CAPS
ORAL_CAPSULE | ORAL | Status: AC
  Filled 2016-10-09: qty 2

## 2016-10-09 MED ORDER — SODIUM CHLORIDE 0.9% FLUSH
10.0000 mL | INTRAVENOUS | Status: DC | PRN
  Administered 2016-10-09: 10 mL via INTRAVENOUS
  Filled 2016-10-09: qty 10

## 2016-10-09 MED ORDER — SODIUM CHLORIDE 0.9 % IV SOLN: Freq: Once | INTRAVENOUS | Status: DC

## 2016-10-09 MED ORDER — SODIUM CHLORIDE 0.9 % IV SOLN
Freq: Once | INTRAVENOUS | Status: AC
  Administered 2016-10-09: 10:00:00 via INTRAVENOUS

## 2016-10-09 MED ORDER — SODIUM CHLORIDE 0.9 % IV SOLN
Freq: Once | INTRAVENOUS | Status: AC
  Administered 2016-10-09: 14:00:00 via INTRAVENOUS
  Filled 2016-10-09: qty 5

## 2016-10-09 MED ORDER — PERTUZUMAB CHEMO INJECTION 420 MG/14ML
840.0000 mg | Freq: Once | INTRAVENOUS | Status: AC
  Administered 2016-10-09: 840 mg via INTRAVENOUS
  Filled 2016-10-09: qty 28

## 2016-10-09 MED ORDER — PALONOSETRON HCL INJECTION 0.25 MG/5ML
INTRAVENOUS | Status: AC
  Filled 2016-10-09: qty 5

## 2016-10-09 MED ORDER — PEGFILGRASTIM 6 MG/0.6ML ~~LOC~~ PSKT
6.0000 mg | PREFILLED_SYRINGE | Freq: Once | SUBCUTANEOUS | Status: AC
  Administered 2016-10-09: 6 mg via SUBCUTANEOUS
  Filled 2016-10-09: qty 0.6

## 2016-10-09 MED ORDER — SODIUM CHLORIDE 0.9% FLUSH
10.0000 mL | INTRAVENOUS | Status: DC | PRN
  Administered 2016-10-09: 10 mL
  Filled 2016-10-09: qty 10

## 2016-10-09 MED ORDER — DOCETAXEL CHEMO INJECTION 160 MG/16ML
75.0000 mg/m2 | Freq: Once | INTRAVENOUS | Status: AC
  Administered 2016-10-09: 160 mg via INTRAVENOUS
  Filled 2016-10-09: qty 16

## 2016-10-09 MED ORDER — DIPHENHYDRAMINE HCL 25 MG PO CAPS
50.0000 mg | ORAL_CAPSULE | Freq: Once | ORAL | Status: AC
  Administered 2016-10-09: 50 mg via ORAL

## 2016-10-09 MED ORDER — TRASTUZUMAB CHEMO 150 MG IV SOLR
8.0000 mg/kg | Freq: Once | INTRAVENOUS | Status: AC
  Administered 2016-10-09: 840 mg via INTRAVENOUS
  Filled 2016-10-09: qty 40

## 2016-10-09 MED ORDER — ACETAMINOPHEN 325 MG PO TABS
ORAL_TABLET | ORAL | Status: AC
  Filled 2016-10-09: qty 2

## 2016-10-09 MED ORDER — SODIUM CHLORIDE 0.9 % IV SOLN
750.0000 mg | Freq: Once | INTRAVENOUS | Status: AC
  Administered 2016-10-09: 750 mg via INTRAVENOUS
  Filled 2016-10-09: qty 75

## 2016-10-09 MED ORDER — ACETAMINOPHEN 325 MG PO TABS
650.0000 mg | ORAL_TABLET | Freq: Once | ORAL | Status: AC
  Administered 2016-10-09: 650 mg via ORAL

## 2016-10-09 MED ORDER — HEPARIN SOD (PORK) LOCK FLUSH 100 UNIT/ML IV SOLN
500.0000 [IU] | Freq: Once | INTRAVENOUS | Status: AC | PRN
  Administered 2016-10-09: 500 [IU]
  Filled 2016-10-09: qty 5

## 2016-10-09 MED ORDER — ALUM & MAG HYDROXIDE-SIMETH 200-200-20 MG/5ML PO SUSP
15.0000 mL | Freq: Once | ORAL | Status: AC
  Administered 2016-10-09: 15 mL via ORAL
  Filled 2016-10-09: qty 30

## 2016-10-09 MED ORDER — PALONOSETRON HCL INJECTION 0.25 MG/5ML
0.2500 mg | Freq: Once | INTRAVENOUS | Status: AC
  Administered 2016-10-09: 0.25 mg via INTRAVENOUS

## 2016-10-09 NOTE — Assessment & Plan Note (Signed)
Patient presented to the [facility] today to receive cycle one of her Taxotere/carboplatin/Herceptin/Perjeta chemotherapy therapy regimen.  See further notes for details of today's visit.  Patient is scheduled to return for labs, flush, and a visit on 10/16/2016.

## 2016-10-09 NOTE — Progress Notes (Signed)
Pt started to complain of strong heartburn and began tearful during 1-hour post observation of Perjeta. Selena Lesser, NP notified. Selena Lesser, NP at chairside. Maalox given (see eMAR). Patient reports feeling "much better". Will continue to monitor.

## 2016-10-09 NOTE — Progress Notes (Signed)
SYMPTOM MANAGEMENT CLINIC    Chief Complaint: Reflux  HPI:  Stacy [name] 56 y.o. female diagnosed with breast cancer.  Currently undergoing Taxotere/carboplatinum/Herceptin/Perjeta chemotherapy regimen.     Malignant neoplasm of upper-outer quadrant of left female breast ([place])   08/21/2016 Initial Diagnosis    Screening detected left breast mass UOQ at 1:00: 1.2 x 1 x 0.5 cm, axilla negative Left breast biopsy 1:00: IDC with DCIS, grade 1, ER 90%, PR 70%, Ki-67 30%, HER-2 positive ratio 2.43, T1c N0 stage IA clinical stage      09/06/2016 Surgery    Left lumpectomy: IDC grade 2, 3.4 cm, Margins Neg, 0/1 LN neg, ER 90%, PR 70%, Ki-67 30%, HER-2 positive ratio 2.43 T2N0 (stage 2 A)      10/09/2016 -  Neo-Adjuvant Chemotherapy    TCH P 6 cycles followed by HP maintenance       Review of Systems  Gastrointestinal: Positive for heartburn.  Psychiatric/Behavioral: The patient is nervous/anxious.   All other systems reviewed and are negative.   Past Medical History:  Diagnosis Date  . Anxiety   . Arthritis   . Complication of anesthesia    DIFFICULTY WAKING UP , LAST SURGERY NECK 2012 WAS FINE PER PT HERE AT [facility]  . Depression   . Fibromyalgia   . Headache    HX MIGRAINES    . Heart murmur   . Hypertension   . Malignant neoplasm of upper-outer quadrant of left female breast ([place]) 08/23/2016  . MVP (mitral valve prolapse)    AS INFANT  . Neck pain   . Vision abnormalities     Past Surgical History:  Procedure Laterality Date  . cervical fusiion  2012  . JOINT REPLACEMENT    . MITRAL VALVE REPAIR     MVP 1970  . PORTACATH PLACEMENT Right 09/06/2016   Procedure: INSERTION PORT-A-CATH;  Surgeon: [name], MD;  Location: [place];  Service: General;  Laterality: Right;  . RADIOACTIVE SEED GUIDED MASTECTOMY WITH AXILLARY SENTINEL LYMPH NODE BIOPSY Left 09/06/2016   Procedure: RADIOACTIVE SEED GUIDED LEFT BREAST LUMPECTOMY  WITH SENTINEL LYMPH NODE  BIOPSY;  Surgeon: [name], MD;  Location: [place];  Service: General;  Laterality: Left;  . ROTATOR CUFF REPAIR Right 2012  . TOTAL HIP ARTHROPLASTY Right 09/14/2015   Procedure: RIGHT TOTAL HIP ARTHROPLASTY ANTERIOR APPROACH;  Surgeon: Leandrew Koyanagi, MD;  Location: [place];  Service: Orthopedics;  Laterality: Right;  . TUBAL LIGATION  1988    has Essential hypertension; Fibromyalgia; Insomnia; MITRAL VALVE PROLAPSE, HX OF; Arthritis; Depression; Episodic altered awareness; Facial spasm; Right facial numbness; Malignant neoplasm of upper-outer quadrant of left female breast ([place]); Port catheter in place; and GERD (gastroesophageal reflux disease) on her problem list.    is allergic to naproxen; erythromycin; tramadol hcl; and gabapentin.  Allergies as of 10/09/2016      Reactions   Naproxen Palpitations   Erythromycin Nausea And Vomiting   Tramadol Hcl Other (See Comments)   Headaches   Gabapentin Swelling   "makes me feel detached"      Medication List       Accurate as of 10/09/16  5:50 PM. Always use your most recent med list.          azithromycin 250 MG tablet Commonly known as:  ZITHROMAX Z-PAK Use as directed (Z-Pak)   CALCIUM 600/VITAMIN D 600-400 MG-UNIT Tabs Generic drug:  Calcium Carbonate-Vitamin D3 Take 1 tablet by mouth daily.  dexamethasone 4 MG tablet Commonly known as:  DECADRON Take 1 tablet (4 mg total) by mouth daily. Take 1 tablet daily before chemotherapy and 1 tablet the day after with food.   hydrochlorothiazide 25 MG tablet Commonly known as:  HYDRODIURIL TAKE 1 TABLET BY MOUTH EVERY DAY   HYDROcodone-homatropine 5-1.5 MG/5ML syrup Commonly known as:  HYCODAN Take 5 mLs by mouth every 6 (six) hours as needed for cough.   ibuprofen 200 MG tablet Commonly known as:  ADVIL,MOTRIN Take 800 mg by mouth every 4 (four) hours as needed.   lidocaine-prilocaine cream Commonly known as:  EMLA Apply to affected area once     LORazepam 0.5 MG tablet Commonly known as:  ATIVAN Take 1 tablet (0.5 mg total) by mouth every 6 (six) hours as needed (Nausea or vomiting).   ondansetron 8 MG tablet Commonly known as:  ZOFRAN Take 1 tablet (8 mg total) by mouth 2 (two) times daily as needed for refractory nausea / vomiting. Start on day 3 after chemo.   prochlorperazine 10 MG tablet Commonly known as:  COMPAZINE Take 1 tablet (10 mg total) by mouth every 6 (six) hours as needed (Nausea or vomiting).        PHYSICAL EXAMINATION  Oncology Vitals 10/09/2016 10/09/2016  Height - -  Weight - -  Weight (lbs) - -  BMI (kg/m2) - -  Temp (No Data) 97.7  Pulse 80 85  Resp 16 16  SpO2 96 96  BSA (m2) - -   BP Readings from Last 2 Encounters:  10/09/16 (!) 143/93  10/09/16 (!) 150/66    Physical Exam  Constitutional: She is oriented to person, place, and time and well-developed, well-nourished, and in no distress.  HENT:  Head: Normocephalic and atraumatic.  Eyes: Conjunctivae and EOM are normal. Pupils are equal, round, and reactive to light.  Neck: Normal range of motion.  Pulmonary/Chest: Effort normal. No respiratory distress.  Musculoskeletal: Normal range of motion.  Neurological: She is alert and oriented to person, place, and time.  Skin: Skin is warm and dry.  Psychiatric:  Anxious and slightly tearful  Nursing note and vitals reviewed.   LABORATORY DATA:. Appointment on 10/09/2016  Component Date Value Ref Range Status  . WBC 10/09/2016 16.7* 3.9 - 10.3 10e3/uL Final  . NEUT# 10/09/2016 12.6* 1.5 - 6.5 10e3/uL Final  . HGB 10/09/2016 13.6  11.6 - 15.9 g/dL Final  . HCT 10/09/2016 39.4  34.8 - 46.6 % Final  . Platelets 10/09/2016 312  145 - 400 10e3/uL Final  . MCV 10/09/2016 87.6  79.5 - 101.0 fL Final  . MCH 10/09/2016 30.2  25.1 - 34.0 pg Final  . MCHC 10/09/2016 34.4  31.5 - 36.0 g/dL Final  . RBC 10/09/2016 4.50  3.70 - 5.45 10e6/uL Final  . RDW 10/09/2016 13.5  11.2 - 14.5 % Final  .  lymph# 10/09/2016 2.8  0.9 - 3.3 10e3/uL Final  . MONO# 10/09/2016 1.1* 0.1 - 0.9 10e3/uL Final  . Eosinophils Absolute 10/09/2016 0.1  0.0 - 0.5 10e3/uL Final  . Basophils Absolute 10/09/2016 0.1  0.0 - 0.1 10e3/uL Final  . NEUT% 10/09/2016 75.4  38.4 - 76.8 % Final  . LYMPH% 10/09/2016 16.5  14.0 - 49.7 % Final  . MONO% 10/09/2016 6.8  0.0 - 14.0 % Final  . EOS% 10/09/2016 0.6  0.0 - 7.0 % Final  . BASO% 10/09/2016 0.7  0.0 - 2.0 % Final  . Sodium 10/09/2016 136  136 -  145 mEq/L Final  . Potassium 10/09/2016 3.4* 3.5 - 5.1 mEq/L Final  . Chloride 10/09/2016 104  98 - 109 mEq/L Final  . CO2 10/09/2016 21* 22 - 29 mEq/L Final  . Glucose 10/09/2016 98  70 - 140 mg/dl Final  . BUN 10/09/2016 22.7  7.0 - 26.0 mg/dL Final  . Creatinine 10/09/2016 0.9  0.6 - 1.1 mg/dL Final  . Total Bilirubin 10/09/2016 0.42  0.20 - 1.20 mg/dL Final  . Alkaline Phosphatase 10/09/2016 67  40 - 150 U/L Final  . AST 10/09/2016 15  5 - 34 U/L Final  . ALT 10/09/2016 13  0 - 55 U/L Final  . Total Protein 10/09/2016 7.5  6.4 - 8.3 g/dL Final  . Albumin 10/09/2016 3.9  3.5 - 5.0 g/dL Final  . Calcium 10/09/2016 9.7  8.4 - 10.4 mg/dL Final  . Anion Gap 10/09/2016 12* 3 - 11 mEq/L Final  . EGFR 10/09/2016 75* >90 ml/min/1.73 m2 Final    RADIOGRAPHIC STUDIES: No results found.  ASSESSMENT/PLAN:    Malignant neoplasm of upper-outer quadrant of left female breast [facility]) Patient presented to the [place] today to receive cycle one of her Taxotere/carboplatin/Herceptin/Perjeta chemotherapy therapy regimen.  See further notes for details of today's visit.  Patient is scheduled to return for labs, flush, and a visit on 10/16/2016.  GERD (gastroesophageal reflux disease) Patient was in the midst of receiving her chemotherapy today; when she complained of some heartburn only.  She denied any actual chest pain, chest pressure, shortness breath, or pain with inspiration.  She denied any other new symptoms whatsoever.   She stated that she was very anxious to start her new chemotherapy; was slightly tearful as well.  Patient was ordered Maalox; which resolved her symptoms.  Patient was advised to call/return or go directly to the emergency department for any worsening symptoms whatsoever.   Patient stated understanding of all instructions; and was in agreement with this plan of care. The patient knows to call the clinic with any problems, questions or concerns.   Total time spent with patient was 10 minutes;  with greater than 75 percent of that time spent in face to face counseling regarding patient's symptoms,  and coordination of care and follow up.  Disclaimer:This dictation was prepared with Dragon/digital dictation along with [employer]. Any transcriptional errors that result from this process are unintentional.  Drue Second, NP 10/09/2016

## 2016-10-09 NOTE — Patient Instructions (Signed)
[facility] Discharge Instructions for Patients Receiving Chemotherapy  Today you received the following chemotherapy agents Herceptin, Perjeta, Taxotere and Carboplatin   To help prevent nausea and vomiting after your treatment, we encourage you to take your nausea medication as directed. No Zofran for 3 days. Take Compazine instead.    If you develop nausea and vomiting that is not controlled by your nausea medication, call the clinic.   BELOW ARE SYMPTOMS THAT SHOULD BE REPORTED IMMEDIATELY:  *FEVER GREATER THAN 100.5 F  *CHILLS WITH OR WITHOUT FEVER  NAUSEA AND VOMITING THAT IS NOT CONTROLLED WITH YOUR NAUSEA MEDICATION  *UNUSUAL SHORTNESS OF BREATH  *UNUSUAL BRUISING OR BLEEDING  TENDERNESS IN MOUTH AND THROAT WITH OR WITHOUT PRESENCE OF ULCERS  *URINARY PROBLEMS  *BOWEL PROBLEMS  UNUSUAL RASH Items with * indicate a potential emergency and should be followed up as soon as possible.  Feel free to call the clinic you have any questions or concerns. The clinic phone number is [phone].  Please show the [facility] at check-in to the Emergency Department and triage nurse.  Trastuzumab injection for infusion What is this medicine? TRASTUZUMAB (tras TOO zoo mab) is a monoclonal antibody. It is used to treat breast cancer and stomach cancer. This medicine may be used for other purposes; ask your health care provider or pharmacist if you have questions. COMMON BRAND NAME(S): Herceptin What should I tell my health care provider before I take this medicine? They need to know if you have any of these conditions: -heart disease -heart failure -infection (especially a virus infection such as chickenpox, cold sores, or herpes) -lung or breathing disease, like asthma -recent or ongoing radiation therapy -an unusual or allergic reaction to trastuzumab, benzyl alcohol, or other medications, foods, dyes, or preservatives -pregnant or trying to get  pregnant -breast-feeding How should I use this medicine? This drug is given as an infusion into a vein. It is administered in a hospital or clinic by a specially trained health care professional. Talk to your pediatrician regarding the use of this medicine in children. This medicine is not approved for use in children. Overdosage: If you think you have taken too much of this medicine contact a poison control center or emergency room at once. NOTE: This medicine is only for you. Do not share this medicine with others. What if I miss a dose? It is important not to miss a dose. Call your doctor or health care professional if you are unable to keep an appointment. What may interact with this medicine? -doxorubicin -warfarin This list may not describe all possible interactions. Give your health care provider a list of all the medicines, herbs, non-prescription drugs, or dietary supplements you use. Also tell them if you smoke, drink alcohol, or use illegal drugs. Some items may interact with your medicine. What should I watch for while using this medicine? Visit your doctor for checks on your progress. Report any side effects. Continue your course of treatment even though you feel ill unless your doctor tells you to stop. Call your doctor or health care professional for advice if you get a fever, chills or sore throat, or other symptoms of a cold or flu. Do not treat yourself. Try to avoid being around people who are sick. You may experience fever, chills and shaking during your first infusion. These effects are usually mild and can be treated with other medicines. Report any side effects during the infusion to your health care professional. Fever and  chills usually do not happen with later infusions. Do not become pregnant while taking this medicine or for 7 months after stopping it. Women should inform their doctor if they wish to become pregnant or think they might be pregnant. Women of child-bearing  potential will need to have a negative pregnancy test before starting this medicine. There is a potential for serious side effects to an unborn child. Talk to your health care professional or pharmacist for more information. Do not breast-feed an infant while taking this medicine or for 7 months after stopping it. Women must use effective birth control with this medicine. What side effects may I notice from receiving this medicine? Side effects that you should report to your doctor or health care professional as soon as possible: -breathing difficulties -chest pain or palpitations -cough -dizziness or fainting -fever or chills, sore throat -skin rash, itching or hives -swelling of the legs or ankles -unusually weak or tired Side effects that usually do not require medical attention (report to your doctor or health care professional if they continue or are bothersome): -loss of appetite -headache -muscle aches -nausea This list may not describe all possible side effects. Call your doctor for medical advice about side effects. You may report side effects to FDA at 1-800-FDA-1088. Where should I keep my medicine? This drug is given in a hospital or clinic and will not be stored at home. NOTE: This sheet is a summary. It may not cover all possible information. If you have questions about this medicine, talk to your doctor, pharmacist, or health care provider.  2017 Elsevier/Gold Standard (2015-10-05 17:16:44)  Pertuzumab injection What is this medicine? PERTUZUMAB (per TOOZ ue mab) is a monoclonal antibody. It is used to treat breast cancer. COMMON BRAND NAME(S): PERJETA What should I tell my health care provider before I take this medicine? They need to know if you have any of these conditions: -heart disease -heart failure -high blood pressure -history of irregular heart beat -recent or ongoing radiation therapy -an unusual or allergic reaction to pertuzumab, other medicines, foods,  dyes, or preservatives -pregnant or trying to get pregnant -breast-feeding How should I use this medicine? This medicine is for infusion into a vein. It is given by a health care professional in a hospital or clinic setting. Talk to your pediatrician regarding the use of this medicine in children. Special care may be needed. What if I miss a dose? It is important not to miss your dose. Call your doctor or health care professional if you are unable to keep an appointment. What may interact with this medicine? Interactions are not expected. Give your health care provider a list of all the medicines, herbs, non-prescription drugs, or dietary supplements you use. Also tell them if you smoke, drink alcohol, or use illegal drugs. Some items may interact with your medicine. What should I watch for while using this medicine? Your condition will be monitored carefully while you are receiving this medicine. Report any side effects. Continue your course of treatment even though you feel ill unless your doctor tells you to stop. Do not become pregnant while taking this medicine or for 7 months after stopping it. Women should inform their doctor if they wish to become pregnant or think they might be pregnant. Women of child-bearing potential will need to have a negative pregnancy test before starting this medicine. There is a potential for serious side effects to an unborn child. Talk to your health care professional or pharmacist for more information.  Do not breast-feed an infant while taking this medicine or for 7 months after stopping it. Women must use effective birth control with this medicine. Call your doctor or health care professional for advice if you get a fever, chills or sore throat, or other symptoms of a cold or flu. Do not treat yourself. Try to avoid being around people who are sick. You may experience fever, chills, and headache during the infusion. Report any side effects during the infusion to  your health care professional. What side effects may I notice from receiving this medicine? Side effects that you should report to your doctor or health care professional as soon as possible: -breathing problems -chest pain or palpitations -dizziness -feeling faint or lightheaded -fever or chills -skin rash, itching or hives -sore throat -swelling of the face, lips, or tongue -swelling of the legs or ankles -unusually weak or tired Side effects that usually do not require medical attention (report to your doctor or health care professional if they continue or are bothersome): -diarrhea -hair loss -nausea, vomiting -tiredness Where should I keep my medicine? This drug is given in a hospital or clinic and will not be stored at home.  2017 Elsevier/Gold Standard (2015-10-06 12:08:50)  Docetaxel injection What is this medicine? DOCETAXEL (doe se TAX el) is a chemotherapy drug. It targets fast dividing cells, like cancer cells, and causes these cells to die. This medicine is used to treat many types of cancers like breast cancer, certain stomach cancers, head and neck cancer, lung cancer, and prostate cancer. This medicine may be used for other purposes; ask your health care provider or pharmacist if you have questions. COMMON BRAND NAME(S): Docefrez, Taxotere What should I tell my health care provider before I take this medicine? They need to know if you have any of these conditions: -infection (especially a virus infection such as chickenpox, cold sores, or herpes) -liver disease -low blood counts, like low white cell, platelet, or red cell counts -an unusual or allergic reaction to docetaxel, polysorbate 80, other chemotherapy agents, other medicines, foods, dyes, or preservatives -pregnant or trying to get pregnant -breast-feeding How should I use this medicine? This drug is given as an infusion into a vein. It is administered in a hospital or clinic by a specially trained health  care professional. Talk to your pediatrician regarding the use of this medicine in children. Special care may be needed. Overdosage: If you think you have taken too much of this medicine contact a poison control center or emergency room at once. NOTE: This medicine is only for you. Do not share this medicine with others. What if I miss a dose? It is important not to miss your dose. Call your doctor or health care professional if you are unable to keep an appointment. What may interact with this medicine? -cyclosporine -erythromycin -ketoconazole -medicines to increase blood counts like filgrastim, pegfilgrastim, sargramostim -vaccines Talk to your doctor or health care professional before taking any of these medicines: -acetaminophen -aspirin -ibuprofen -ketoprofen -naproxen This list may not describe all possible interactions. Give your health care provider a list of all the medicines, herbs, non-prescription drugs, or dietary supplements you use. Also tell them if you smoke, drink alcohol, or use illegal drugs. Some items may interact with your medicine. What should I watch for while using this medicine? Your condition will be monitored carefully while you are receiving this medicine. You will need important blood work done while you are taking this medicine. This drug may make you  feel generally unwell. This is not uncommon, as chemotherapy can affect healthy cells as well as cancer cells. Report any side effects. Continue your course of treatment even though you feel ill unless your doctor tells you to stop. In some cases, you may be given additional medicines to help with side effects. Follow all directions for their use. Call your doctor or health care professional for advice if you get a fever, chills or sore throat, or other symptoms of a cold or flu. Do not treat yourself. This drug decreases your body's ability to fight infections. Try to avoid being around people who are sick. This  medicine may increase your risk to bruise or bleed. Call your doctor or health care professional if you notice any unusual bleeding. This medicine may contain alcohol in the product. You may get drowsy or dizzy. Do not drive, use machinery, or do anything that needs mental alertness until you know how this medicine affects you. Do not stand or sit up quickly, especially if you are an older patient. This reduces the risk of dizzy or fainting spells. Avoid alcoholic drinks. Do not become pregnant while taking this medicine. Women should inform their doctor if they wish to become pregnant or think they might be pregnant. There is a potential for serious side effects to an unborn child. Talk to your health care professional or pharmacist for more information. Do not breast-feed an infant while taking this medicine. What side effects may I notice from receiving this medicine? Side effects that you should report to your doctor or health care professional as soon as possible: -allergic reactions like skin rash, itching or hives, swelling of the face, lips, or tongue -low blood counts - This drug may decrease the number of white blood cells, red blood cells and platelets. You may be at increased risk for infections and bleeding. -signs of infection - fever or chills, cough, sore throat, pain or difficulty passing urine -signs of decreased platelets or bleeding - bruising, pinpoint red spots on the skin, black, tarry stools, nosebleeds -signs of decreased red blood cells - unusually weak or tired, fainting spells, lightheadedness -breathing problems -fast or irregular heartbeat -low blood pressure -mouth sores -nausea and vomiting -pain, swelling, redness or irritation at the injection site -pain, tingling, numbness in the hands or feet -swelling of the ankle, feet, hands -weight gain Side effects that usually do not require medical attention (report to your doctor or health care professional if they  continue or are bothersome): -bone pain -complete hair loss including hair on your head, underarms, pubic hair, eyebrows, and eyelashes -diarrhea -excessive tearing -changes in the color of fingernails -loosening of the fingernails -nausea -muscle pain -red flush to skin -sweating -weak or tired This list may not describe all possible side effects. Call your doctor for medical advice about side effects. You may report side effects to FDA at 1-800-FDA-1088. Where should I keep my medicine? This drug is given in a hospital or clinic and will not be stored at home. NOTE: This sheet is a summary. It may not cover all possible information. If you have questions about this medicine, talk to your doctor, pharmacist, or health care provider.  2017 Elsevier/Gold Standard (2015-10-06 12:32:56)  Carboplatin injection What is this medicine? CARBOPLATIN (KAR boe pla tin) is a chemotherapy drug. It targets fast dividing cells, like cancer cells, and causes these cells to die. This medicine is used to treat ovarian cancer and many other cancers. This medicine may be used  for other purposes; ask your health care provider or pharmacist if you have questions. COMMON BRAND NAME(S): Paraplatin What should I tell my health care provider before I take this medicine? They need to know if you have any of these conditions: -blood disorders -hearing problems -kidney disease -recent or ongoing radiation therapy -an unusual or allergic reaction to carboplatin, cisplatin, other chemotherapy, other medicines, foods, dyes, or preservatives -pregnant or trying to get pregnant -breast-feeding How should I use this medicine? This drug is usually given as an infusion into a vein. It is administered in a hospital or clinic by a specially trained health care professional. Talk to your pediatrician regarding the use of this medicine in children. Special care may be needed. Overdosage: If you think you have taken too  much of this medicine contact a poison control center or emergency room at once. NOTE: This medicine is only for you. Do not share this medicine with others. What if I miss a dose? It is important not to miss a dose. Call your doctor or health care professional if you are unable to keep an appointment. What may interact with this medicine? -medicines for seizures -medicines to increase blood counts like filgrastim, pegfilgrastim, sargramostim -some antibiotics like amikacin, gentamicin, neomycin, streptomycin, tobramycin -vaccines Talk to your doctor or health care professional before taking any of these medicines: -acetaminophen -aspirin -ibuprofen -ketoprofen -naproxen This list may not describe all possible interactions. Give your health care provider a list of all the medicines, herbs, non-prescription drugs, or dietary supplements you use. Also tell them if you smoke, drink alcohol, or use illegal drugs. Some items may interact with your medicine. What should I watch for while using this medicine? Your condition will be monitored carefully while you are receiving this medicine. You will need important blood work done while you are taking this medicine. This drug may make you feel generally unwell. This is not uncommon, as chemotherapy can affect healthy cells as well as cancer cells. Report any side effects. Continue your course of treatment even though you feel ill unless your doctor tells you to stop. In some cases, you may be given additional medicines to help with side effects. Follow all directions for their use. Call your doctor or health care professional for advice if you get a fever, chills or sore throat, or other symptoms of a cold or flu. Do not treat yourself. This drug decreases your body's ability to fight infections. Try to avoid being around people who are sick. This medicine may increase your risk to bruise or bleed. Call your doctor or health care professional if you  notice any unusual bleeding. Be careful brushing and flossing your teeth or using a toothpick because you may get an infection or bleed more easily. If you have any dental work done, tell your dentist you are receiving this medicine. Avoid taking products that contain aspirin, acetaminophen, ibuprofen, naproxen, or ketoprofen unless instructed by your doctor. These medicines may hide a fever. Do not become pregnant while taking this medicine. Women should inform their doctor if they wish to become pregnant or think they might be pregnant. There is a potential for serious side effects to an unborn child. Talk to your health care professional or pharmacist for more information. Do not breast-feed an infant while taking this medicine. What side effects may I notice from receiving this medicine? Side effects that you should report to your doctor or health care professional as soon as possible: -allergic reactions like skin rash,  itching or hives, swelling of the face, lips, or tongue -signs of infection - fever or chills, cough, sore throat, pain or difficulty passing urine -signs of decreased platelets or bleeding - bruising, pinpoint red spots on the skin, black, tarry stools, nosebleeds -signs of decreased red blood cells - unusually weak or tired, fainting spells, lightheadedness -breathing problems -changes in hearing -changes in vision -chest pain -high blood pressure -low blood counts - This drug may decrease the number of white blood cells, red blood cells and platelets. You may be at increased risk for infections and bleeding. -nausea and vomiting -pain, swelling, redness or irritation at the injection site -pain, tingling, numbness in the hands or feet -problems with balance, talking, walking -trouble passing urine or change in the amount of urine Side effects that usually do not require medical attention (report to your doctor or health care professional if they continue or are  bothersome): -hair loss -loss of appetite -metallic taste in the mouth or changes in taste This list may not describe all possible side effects. Call your doctor for medical advice about side effects. You may report side effects to FDA at 1-800-FDA-1088. Where should I keep my medicine? This drug is given in a hospital or clinic and will not be stored at home. NOTE: This sheet is a summary. It may not cover all possible information. If you have questions about this medicine, talk to your doctor, pharmacist, or health care provider.  2017 Elsevier/Gold Standard (2007-12-09 14:38:05)

## 2016-10-09 NOTE — Assessment & Plan Note (Signed)
Patient was in the midst of receiving her chemotherapy today; when she complained of some heartburn only.  She denied any actual chest pain, chest pressure, shortness breath, or pain with inspiration.  She denied any other new symptoms whatsoever.  She stated that she was very anxious to start her new chemotherapy; was slightly tearful as well.  Patient was ordered Maalox; which resolved her symptoms.  Patient was advised to call/return or go directly to the emergency department for any worsening symptoms whatsoever.

## 2016-10-09 NOTE — Progress Notes (Signed)
Patient Care Team: [name], NP as PCP - General (Internal Medicine) [name], MD as Consulting Physician (General Surgery) Nicholas Lose, MD as Consulting Physician (Hematology and Oncology) Gery Pray, MD as Consulting Physician (Radiation Oncology)  DIAGNOSIS:  Encounter Diagnosis  Name Primary?  . Malignant neoplasm of upper-outer quadrant of left breast in female, estrogen receptor positive ([place])     SUMMARY OF ONCOLOGIC HISTORY:   Malignant neoplasm of upper-outer quadrant of left female breast ([place])   08/21/2016 Initial Diagnosis    Screening detected left breast mass UOQ at 1:00: 1.2 x 1 x 0.5 cm, axilla negative Left breast biopsy 1:00: IDC with DCIS, grade 1, ER 90%, PR 70%, Ki-67 30%, HER-2 positive ratio 2.43, T1c N0 stage IA clinical stage      09/06/2016 Surgery    Left lumpectomy: IDC grade 2, 3.4 cm, Margins Neg, 0/1 LN neg, ER 90%, PR 70%, Ki-67 30%, HER-2 positive ratio 2.43 T2N0 (stage 2 A)      10/09/2016 -  Neo-Adjuvant Chemotherapy    TCH P 6 cycles followed by HP maintenance       CHIEF COMPLIANT: Cycle 1 day 1 TCHP  INTERVAL HISTORY: Stacy [name] is a 56 year old with above-mentioned history of left breast cancer currently on neoadjuvant chemotherapy. Today is cycle 1 of [place]. Patient is anxious today to get started with treatment. She is here to review her premedications as well as the overall treatment plan. Echocardiogram on 09/06/2016 during EF of 55-60%  REVIEW OF SYSTEMS:   Constitutional: Denies fevers, chills or abnormal weight loss Eyes: Denies blurriness of vision Ears, nose, mouth, throat, and face: Denies mucositis or sore throat Respiratory: Denies cough, dyspnea or wheezes Cardiovascular: Denies palpitation, chest discomfort Gastrointestinal:  Denies nausea, heartburn or change in bowel habits Skin: Denies abnormal skin rashes Lymphatics: Denies new lymphadenopathy or easy bruising Neurological:Denies numbness,  tingling or new weaknesses Behavioral/Psych: Extremities: No lower extremity edema  All other systems were reviewed with the patient and are negative.  I have reviewed the past medical history, past surgical history, social history and family history with the patient and they are unchanged from previous note.  ALLERGIES:  is allergic to naproxen; erythromycin; tramadol hcl; and gabapentin.  MEDICATIONS:  Current Outpatient Prescriptions  Medication Sig Dispense Refill  . azithromycin (ZITHROMAX Z-PAK) 250 MG tablet Use as directed (Z-Pak) 6 each 0  . Calcium Carbonate-Vitamin D3 (CALCIUM 600/VITAMIN D) 600-400 MG-UNIT TABS Take 1 tablet by mouth daily.    Marland Kitchen dexamethasone (DECADRON) 4 MG tablet Take 1 tablet (4 mg total) by mouth daily. Take 1 tablet daily before chemotherapy and 1 tablet the day after with food. 30 tablet 1  . hydrochlorothiazide (HYDRODIURIL) 25 MG tablet TAKE 1 TABLET BY MOUTH EVERY DAY 30 tablet 5  . HYDROcodone-homatropine (HYCODAN) 5-1.5 MG/5ML syrup Take 5 mLs by mouth every 6 (six) hours as needed for cough. 120 mL 0  . ibuprofen (ADVIL,MOTRIN) 200 MG tablet Take 800 mg by mouth every 4 (four) hours as needed.     . lidocaine-prilocaine (EMLA) cream Apply to affected area once 30 g 3  . LORazepam (ATIVAN) 0.5 MG tablet Take 1 tablet (0.5 mg total) by mouth every 6 (six) hours as needed (Nausea or vomiting). 30 tablet 0  . ondansetron (ZOFRAN) 8 MG tablet Take 1 tablet (8 mg total) by mouth 2 (two) times daily as needed for refractory nausea / vomiting. Start on day 3 after chemo. 30 tablet 1  .  prochlorperazine (COMPAZINE) 10 MG tablet Take 1 tablet (10 mg total) by mouth every 6 (six) hours as needed (Nausea or vomiting). 30 tablet 1   No current facility-administered medications for this visit.    Facility-Administered Medications Ordered in Other Visits  Medication Dose Route Frequency Provider Last Rate Last Dose  . sodium chloride flush (NS) 0.9 % injection 10  mL  10 mL Intravenous PRN Nicholas Lose, MD        PHYSICAL EXAMINATION: ECOG PERFORMANCE STATUS: 1 - Symptomatic but completely ambulatory  There were no vitals filed for this visit. There were no vitals filed for this visit.  GENERAL:alert, no distress and comfortable SKIN: skin color, texture, turgor are normal, no rashes or significant lesions EYES: normal, Conjunctiva are pink and non-injected, sclera clear OROPHARYNX:no exudate, no erythema and lips, buccal mucosa, and tongue normal  NECK: supple, thyroid normal size, non-tender, without nodularity LYMPH:  no palpable lymphadenopathy in the cervical, axillary or inguinal LUNGS: clear to auscultation and percussion with normal breathing effort HEART: regular rate & rhythm and no murmurs and no lower extremity edema ABDOMEN:abdomen soft, non-tender and normal bowel sounds MUSCULOSKELETAL:no cyanosis of digits and no clubbing  NEURO: alert & oriented x 3 with fluent speech, no focal motor/sensory deficits EXTREMITIES: No lower extremity edema  LABORATORY DATA:  I have reviewed the data as listed   Chemistry      Component Value Date/Time   NA 141 08/29/2016 0831   K 4.3 08/29/2016 0831   CL 99 04/20/2016 1417   CO2 27 08/29/2016 0831   BUN 18.6 08/29/2016 0831   CREATININE 0.9 08/29/2016 0831      Component Value Date/Time   CALCIUM 10.1 08/29/2016 0831   ALKPHOS 72 08/29/2016 0831   AST 19 08/29/2016 0831   ALT 21 08/29/2016 0831   BILITOT 0.31 08/29/2016 0831       Lab Results  Component Value Date   WBC 11.7 (H) 08/29/2016   HGB 14.9 08/29/2016   HCT 44.7 08/29/2016   MCV 88.5 08/29/2016   PLT 272 08/29/2016   NEUTROABS 7.8 (H) 08/29/2016    ASSESSMENT & PLAN:  Malignant neoplasm of upper-outer quadrant of left female breast ([facility]) Left lumpectomy 09/06/16: IDC grade 2, 3.4 cm, Margins Neg, 0/1 LN neg, T2N0  ER 90%, PR 70%, Ki-67 30%, HER-2 positive ratio 2.43 (stage 2 A)  Treatment Plan: 1. adjuvant  chemotherapy with Herceptin (discussed TCHP followed by Herceptin-Perjeta maintenance to complete 1 year. 2. Adjuvant radiation therapy followed by 3. Adjuvant antiestrogen therapy ----------------------------------------------------------------------------- Current Treatment: TCHP cycle 1 day 1 Antiemetics were reviewed Chemotherapy consent obtained Chemotherapy education completed Echocardiogram 09/12/2016: EF 55-60% Closely monitoring for chemotherapy toxicities. Return to clinic in one week for toxicity check   spent 25 minutes talking to the patient of which more than half was spent in counseling and coordination of care.  No orders of the defined types were placed in this encounter.  The patient has a good understanding of the overall plan. she agrees with it. she will call with any problems that may develop before the next visit here.   Rulon Eisenmenger, MD 10/09/16

## 2016-10-09 NOTE — Addendum Note (Signed)
Addended by: Drue Second R on: 10/09/2016 05:52 PM   Modules accepted: Level of Service

## 2016-10-10 ENCOUNTER — Encounter (HOSPITAL_COMMUNITY): Payer: Self-pay | Admitting: Family Medicine

## 2016-10-10 ENCOUNTER — Emergency Department (HOSPITAL_COMMUNITY)
Admission: EM | Admit: 2016-10-10 | Discharge: 2016-10-11 | Disposition: A | Discharge status: Home / Self Care | Payer: BLUE CROSS/BLUE SHIELD | Attending: Emergency Medicine | Admitting: Emergency Medicine

## 2016-10-10 DIAGNOSIS — F17210 Nicotine dependence, cigarettes, uncomplicated: Secondary | ICD-10-CM | POA: Insufficient documentation

## 2016-10-10 DIAGNOSIS — I10 Essential (primary) hypertension: Secondary | ICD-10-CM | POA: Insufficient documentation

## 2016-10-10 DIAGNOSIS — Z853 Personal history of malignant neoplasm of breast: Secondary | ICD-10-CM | POA: Diagnosis not present

## 2016-10-10 DIAGNOSIS — Z96641 Presence of right artificial hip joint: Secondary | ICD-10-CM | POA: Diagnosis not present

## 2016-10-10 DIAGNOSIS — T7840XA Allergy, unspecified, initial encounter: Secondary | ICD-10-CM | POA: Diagnosis not present

## 2016-10-10 DIAGNOSIS — R05 Cough: Secondary | ICD-10-CM | POA: Diagnosis not present

## 2016-10-10 DIAGNOSIS — R059 Cough, unspecified: Secondary | ICD-10-CM

## 2016-10-10 MED ORDER — DIPHENHYDRAMINE HCL 25 MG PO CAPS
50.0000 mg | ORAL_CAPSULE | Freq: Once | ORAL | Status: AC
  Administered 2016-10-10: 50 mg via ORAL
  Filled 2016-10-10: qty 2

## 2016-10-10 MED ORDER — METHYLPREDNISOLONE SODIUM SUCC 125 MG IJ SOLR
125.0000 mg | Freq: Once | INTRAMUSCULAR | Status: AC
  Administered 2016-10-11: 125 mg via INTRAVENOUS
  Filled 2016-10-10: qty 2

## 2016-10-10 MED ORDER — DEXAMETHASONE 4 MG PO TABS
4.0000 mg | ORAL_TABLET | Freq: Once | ORAL | Status: AC
  Administered 2016-10-10: 4 mg via ORAL
  Filled 2016-10-10: qty 1

## 2016-10-10 MED ORDER — FAMOTIDINE IN NACL 20-0.9 MG/50ML-% IV SOLN
20.0000 mg | Freq: Once | INTRAVENOUS | Status: AC
  Administered 2016-10-10: 20 mg via INTRAVENOUS
  Filled 2016-10-10: qty 50

## 2016-10-10 NOTE — ED Provider Notes (Signed)
[facility] DEPT Provider Note   CSN: [id number] Arrival date & time: 10/10/16  2233  By signing my name below, I, Gwenlyn Fudge, attest that this documentation has been prepared under the direction and in the presence of [address] [place]. Electronically Signed: Gwenlyn Fudge, ED Scribe. 10/10/16. 11:36 PM.  History   Chief Complaint Chief Complaint  Patient presents with  . Allergic Reaction   The history is provided by the patient. No language interpreter was used.  Allergic Reaction  Presenting symptoms: itching   Presenting symptoms: no difficulty swallowing, no rash, no swelling and no wheezing   Severity:  Mild Duration:  3 hours Prior allergic episodes:  No prior episodes Context: medications   Relieved by:  Nothing Worsened by:  Nothing Ineffective treatments:  Antihistamines   HPI Comments: TANYEKA MATHWIG is a 56 y.o. female with L sided Breast Cancer on TCH-P which were started yesterday (under care of Dr. Lindi Adie), who presents to the Emergency Department complaining of an allergic reaction beginning tonight around 8:30 PM. She received her first chemo treatment yesterday, and today received the first dose of Neulasta in her pod. She started Neulasta injection at 8:30 PM and started experiencing burning around the site of the neulasta, as well as generalized redness, tingling sensation of her tongue, throat tightness, generalized itching, and burning sensation of skin "like a sunburn". She took a Claritin before coming to ED with no relief to symptoms. She has not taken her Decadron since yesterday (supposed to take 4mg  day before chemo and day after chemo). No known aggravating factors. She called the on-call provider and she was instructed to come in to the ED for evaluation. She has had a nonproductive cough for a ~2-3 days, and had nausea earlier from the chemo but denies ongoing nausea; otherwise denies any other complaints. She denies drooling, oral lesions, rashes,  trismus, wheezing, difficulty swallowing or breathing, CP, SOB, fever, chills, rhinorrhea, abd pain, ongoing nausea, vomiting, diarrhea, constipation, dysuria, hematuria, numbness, tingling, tongue or lip swelling, weakness, or any other complaints at this time.   Past Medical History:  Diagnosis Date  . Anxiety   . Arthritis   . Complication of anesthesia    DIFFICULTY WAKING UP , LAST SURGERY NECK 2012 WAS FINE PER PT HERE AT [facility]  . Depression   . Fibromyalgia   . Headache    HX MIGRAINES    . Heart murmur   . Hypertension   . Malignant neoplasm of upper-outer quadrant of left female breast ([place]) 08/23/2016  . MVP (mitral valve prolapse)    AS INFANT  . Neck pain   . Vision abnormalities     Patient Active Problem List   Diagnosis Date Noted  . Port catheter in place 10/09/2016  . GERD (gastroesophageal reflux disease) 10/09/2016  . Malignant neoplasm of upper-outer quadrant of left female breast ([place]) 08/23/2016  . Episodic altered awareness 08/01/2016  . Facial spasm 08/01/2016  . Right facial numbness 08/01/2016  . Depression 04/20/2016  . Arthritis 06/27/2015  . Insomnia 10/13/2009  . Essential hypertension 04/19/2009  . Fibromyalgia 02/18/2008  . MITRAL VALVE PROLAPSE, HX OF 02/18/2008    Past Surgical History:  Procedure Laterality Date  . cervical fusiion  2012  . JOINT REPLACEMENT    . MITRAL VALVE REPAIR     MVP 1970  . PORTACATH PLACEMENT Right 09/06/2016   Procedure: INSERTION PORT-A-CATH;  Surgeon: [name], MD;  Location: [facility];  Service: General;  Laterality:  Right;  Marland Kitchen RADIOACTIVE SEED GUIDED MASTECTOMY WITH AXILLARY SENTINEL LYMPH NODE BIOPSY Left 09/06/2016   Procedure: RADIOACTIVE SEED GUIDED LEFT BREAST LUMPECTOMY  WITH SENTINEL LYMPH NODE BIOPSY;  Surgeon: [name], MD;  Location: [facility];  Service: General;  Laterality: Left;  . ROTATOR CUFF REPAIR Right 2012  . TOTAL HIP ARTHROPLASTY Right 09/14/2015     Procedure: RIGHT TOTAL HIP ARTHROPLASTY ANTERIOR APPROACH;  Surgeon: Leandrew Koyanagi, MD;  Location: [facility];  Service: Orthopedics;  Laterality: Right;  . TUBAL LIGATION  1988    OB History    No data available       Home Medications    Prior to Admission medications   Medication Sig Start Date End Date Taking? Authorizing Provider  azithromycin (ZITHROMAX Z-PAK) 250 MG tablet Use as directed (Z-Pak) 09/18/16   Nicholas Lose, MD  Calcium Carbonate-Vitamin D3 (CALCIUM 600/VITAMIN D) 600-400 MG-UNIT TABS Take 1 tablet by mouth daily.    Historical Provider, MD  dexamethasone (DECADRON) 4 MG tablet Take 1 tablet (4 mg total) by mouth daily. Take 1 tablet daily before chemotherapy and 1 tablet the day after with food. 09/26/16   Nicholas Lose, MD  hydrochlorothiazide (HYDRODIURIL) 25 MG tablet TAKE 1 TABLET BY MOUTH EVERY DAY 04/27/16   [name], NP  HYDROcodone-homatropine [facility]) 5-1.5 MG/5ML syrup Take 5 mLs by mouth every 6 (six) hours as needed for cough. 09/18/16   Nicholas Lose, MD  ibuprofen (ADVIL,MOTRIN) 200 MG tablet Take 800 mg by mouth every 4 (four) hours as needed.     Historical Provider, MD  lidocaine-prilocaine (EMLA) cream Apply to affected area once 09/26/16   Nicholas Lose, MD  LORazepam (ATIVAN) 0.5 MG tablet Take 1 tablet (0.5 mg total) by mouth every 6 (six) hours as needed (Nausea or vomiting). 09/26/16   Nicholas Lose, MD  ondansetron (ZOFRAN) 8 MG tablet Take 1 tablet (8 mg total) by mouth 2 (two) times daily as needed for refractory nausea / vomiting. Start on day 3 after chemo. 09/26/16   Nicholas Lose, MD  prochlorperazine (COMPAZINE) 10 MG tablet Take 1 tablet (10 mg total) by mouth every 6 (six) hours as needed (Nausea or vomiting). 09/26/16   Nicholas Lose, MD    Family History Family History  Problem Relation Age of Onset  . Adopted: Yes    Social History Social History  Substance Use Topics  . Smoking status: Current Every Day Smoker    Packs/day: 0.25     Types: Cigarettes  . Smokeless tobacco: Never Used  . Alcohol use No     Allergies   Naproxen; Erythromycin; Tramadol hcl; and Gabapentin   Review of Systems Review of Systems  Constitutional: Negative for fever.  HENT: Negative for drooling, facial swelling, rhinorrhea, sore throat and trouble swallowing.        No trismus  Respiratory: Positive for cough. Negative for shortness of breath and wheezing.   Cardiovascular: Negative for chest pain.  Gastrointestinal: Positive for nausea (from chemo, not ongoing). Negative for abdominal pain, constipation, diarrhea and vomiting.  Genitourinary: Negative for dysuria and hematuria.  Musculoskeletal: Negative for arthralgias and myalgias.  Skin: Positive for color change (red) and itching. Negative for rash.  Allergic/Immunologic: Positive for immunocompromised state (on chemo).  Neurological: Negative for weakness and numbness.  Psychiatric/Behavioral: Negative for confusion.   10 Systems reviewed and are negative for acute change except as noted in the HPI.   Physical Exam Updated Vital Signs BP (!) 132/115 (BP  Location: Left Arm)   Pulse 84   Temp 97.9 F (36.6 C) (Oral)   Resp 18   Ht 5\' 4"  (1.626 m)   Wt 220 lb (99.8 kg)   SpO2 94%   BMI 37.76 kg/m   Physical Exam  Constitutional: She is oriented to person, place, and time. Vital signs are normal. She appears well-developed and well-nourished.  Non-toxic appearance. No distress.  Afebrile, nontoxic, NAD  HENT:  Head: Normocephalic and atraumatic.  Mouth/Throat: Oropharynx is clear and moist and mucous membranes are normal. No oral lesions.  No facial/tongue/lip swelling, handling secretions well, airway patent, oropharynx clear and moist, no oral lesions   Eyes: Conjunctivae and EOM are normal. Right eye exhibits no discharge. Left eye exhibits no discharge.  Neck: Normal range of motion. Neck supple.  Cardiovascular: Normal rate, regular rhythm, normal heart sounds and  intact distal pulses.  Exam reveals no gallop and no friction rub.   No murmur heard. Pulmonary/Chest: Effort normal and breath sounds normal. No respiratory distress. She has no decreased breath sounds. She has no wheezes. She has no rhonchi. She has no rales.  CTAB in all lung fields, no w/r/r, no hypoxia or increased WOB, speaking in full sentences, SpO2 94% on RA   Abdominal: Soft. Normal appearance and bowel sounds are normal. She exhibits no distension. There is no tenderness. There is no rigidity, no rebound, no guarding, no CVA tenderness, no tenderness at McBurney's point and negative Murphy's sign.  Musculoskeletal: Normal range of motion.  Neurological: She is alert and oriented to person, place, and time. She has normal strength. No sensory deficit.  Skin: Skin is warm, dry and intact. No rash noted.  No rashes, but skin appears slightly red; no lesions or skin sloughing  Psychiatric: She has a normal mood and affect.  Nursing note and vitals reviewed.   ED Treatments / Results  DIAGNOSTIC STUDIES: Oxygen Saturation is 94% on RA, adequate by my interpretation.    COORDINATION OF CARE: 11:36 PM Discussed treatment plan with pt at bedside which includes benadryl/decadron/pepcid and solumedrol and pt agreed to plan.  Labs (all labs ordered are listed, but only abnormal results are displayed) Labs Reviewed - No data to display  EKG  EKG Interpretation None       Radiology Dg Chest 2 View  Result Date: 10/11/2016 CLINICAL DATA:  56 year old female with cough. EXAM: CHEST  2 VIEW COMPARISON:  Chest radiograph dated 09/06/2016 FINDINGS: Right pectoral Port-A-Cath with tip over central SVC. The lungs are clear. There is no pleural effusion or pneumothorax. The cardiac silhouette is within normal limits. Median sternotomy wires noted. Multiple surgical clips noted in left chest wall. Lower cervical fixation hardware. No acute osseous pathology. IMPRESSION: No active  cardiopulmonary disease. Electronically Signed   By: Anner Crete M.D.   On: 10/11/2016 00:20    Procedures Procedures (including critical care time)  Medications Ordered in ED Medications  diphenhydrAMINE (BENADRYL) capsule 50 mg (50 mg Oral Given 10/10/16 2341)  famotidine (PEPCID) IVPB 20 mg premix (0 mg Intravenous Stopped 10/11/16 0004)  dexamethasone (DECADRON) tablet 4 mg (4 mg Oral Given 10/10/16 2341)  methylPREDNISolone sodium succinate (SOLU-MEDROL) 125 mg/2 mL injection 125 mg (125 mg Intravenous Given 10/11/16 0004)     Initial Impression / Assessment and Plan / ED Course  I have reviewed the triage vital signs and the nursing notes.  Pertinent labs & imaging results that were available during my care of the patient were reviewed by  me and considered in my medical decision making (see chart for details).     56 y.o. female here with allergic rxn to neulasta; appears red but no s/sx of SJS, no oral lesions or swelling, maintaining airway well. No skin sloughing. Clear lungs although pt reports cough x2-3 days, will obtain CXR. Will give benadryl, pepcid, and home decadron which she missed today; will also discuss with on call oncologist to see if anything different needs to be done. Discussed case with my attending Dr. Randal Buba who agrees with plan. Will reassess shortly.   11:55 PM Dr. Irene Limbo returning page for oncology; states that it doesn't sound like SJS, could be allergic reaction from neulasta or from her chemo yesterday; instructed to take decadron as she was supposed to, and give one dose of solumedrol 125mg  IV, as well as typical allergic reaction meds (pepcid, benadryl); going home would recommend benadryl/H2 blocker and 3-4 days of decadron 4mg  as well as calling Dr. Lindi Adie in the morning to have any further instruction and to see about possible changes to next course of chemo/etc. Otherwise he doesn't feel she needs anything further emergently, unless s/sxs of SJS  occur. Will add on solumedrol, still awaiting CXR and other meds. Will reassess shortly  2:46 AM CXR neg, pt feeling better. No worsening symptoms after ~3hrs since meds given, and ~7hrs since onset of symptoms; will d/c home with 4 days of decadron, benadryl, and zantac; advised avoidance of triggers, tylenol/motrin for pain, OTC itch meds for itching. Call Dr. Lindi Adie in the morning for ongoing instruction and close f/up; strict return precautions advised. I explained the diagnosis and have given explicit precautions to return to the ER including for any other new or worsening symptoms. The patient understands and accepts the medical plan as it's been dictated and I have answered their questions. Discharge instructions concerning home care and prescriptions have been given. The patient is STABLE and is discharged to home in good condition.   I personally performed the services described in this documentation, which was scribed in my presence. The recorded information has been reviewed and is accurate.   Final Clinical Impressions(s) / ED Diagnoses   Final diagnoses:  Allergic reaction, initial encounter  Cough    New Prescriptions New Prescriptions   DEXAMETHASONE (DECADRON) 4 MG TABLET    Take 1 tablet (4 mg total) by mouth daily with breakfast. X 4 days starting 10/11/16   DIPHENHYDRAMINE (BENADRYL) 25 MG TABLET    Take 2 tablets (50 mg total) by mouth every 4 (four) hours as needed for itching.   RANITIDINE (ZANTAC) 150 MG TABLET    Take 1 tablet (150 mg total) by mouth 2 (two) times daily. x4 days       Conan Bowens, PA-C 10/11/16 [id number]    April Palumbo, MD 10/11/16 [phone]

## 2016-10-10 NOTE — ED Triage Notes (Addendum)
Patient has breast cancer and received first chemo treatment yesterday. Tonight, patient adminstered Neulasta. After starting injection, she felt like she was burning, sunburn, itching, and tongue tingles. Pt is able to swallow saliva and speak full sentences. Pt took [place] before coming.

## 2016-10-11 ENCOUNTER — Emergency Department (HOSPITAL_COMMUNITY): Payer: BLUE CROSS/BLUE SHIELD

## 2016-10-11 ENCOUNTER — Telehealth: Payer: Self-pay

## 2016-10-11 DIAGNOSIS — R05 Cough: Secondary | ICD-10-CM | POA: Diagnosis not present

## 2016-10-11 MED ORDER — RANITIDINE HCL 150 MG PO TABS: 150.0000 mg | ORAL_TABLET | Freq: Two times a day (BID) | ORAL | 0 refills | Status: DC

## 2016-10-11 MED ORDER — DIPHENHYDRAMINE HCL 25 MG PO TABS: 50.0000 mg | ORAL_TABLET | ORAL | 0 refills | Status: DC | PRN

## 2016-10-11 MED ORDER — DEXAMETHASONE 4 MG PO TABS: 4.0000 mg | ORAL_TABLET | Freq: Every day | ORAL | 0 refills | Status: AC

## 2016-10-11 NOTE — ED Notes (Signed)
No respiratory or acute distress noted alert and oriented x 3 call light in reach no reaction to medication noted states she feels better no itching noted or voiced visitor at bedside.

## 2016-10-11 NOTE — Discharge Instructions (Addendum)
Take decadron, Benadryl, and zantac as prescribed. Continue your usual home medications. Get plenty of rest and drink plenty of fluids. Avoid any known triggers. Use tylenol and motrin as needed for pain, may consider using calamine lotion or over the counter cortisone cream to help with itching. Call your oncologist in the morning to tell them about today's episode, and for further instruction on any further management of your symptoms and your chemotherapy. Return to the ER for changes or worsening symptoms, including signs/symptoms of Steven's Johnson Syndrome (see handout below)-- if this occurs, call 911 and come immediately to the ER.

## 2016-10-11 NOTE — Telephone Encounter (Signed)
Called pt to check up on her symptoms since being in the ED for allergic reaction from neulasta. Dr.Gudena is aware of her situation and will see her next week to discuss about neulasta and chemo treatment. Pt states that all her symptoms resolved and has been doing fine since she was given rescue medication for allergic reaction in the ED. Pt is doing well with very minimal nausea (taking nausea medication) and is currently taking decadron. Pt states that she is not taking benadryl/pepcid as instructed in the ED because she states that she is no longer having symptoms. Told pt to call if she has any other concerns.

## 2016-10-11 NOTE — ED Notes (Signed)
No respiratory or acute distress noted alert and oriented x 3 no reaction to medication noted visitor at bedside call light in reach. 

## 2016-10-12 ENCOUNTER — Encounter: Payer: Self-pay | Admitting: Hematology and Oncology

## 2016-10-12 NOTE — Progress Notes (Signed)
Faxed disability paperwork on 10/11/16 to [employer] at [phone]

## 2016-10-15 ENCOUNTER — Other Ambulatory Visit: Payer: Self-pay | Admitting: Emergency Medicine

## 2016-10-15 ENCOUNTER — Ambulatory Visit: Payer: BLUE CROSS/BLUE SHIELD | Attending: General Surgery | Admitting: Physical Therapy

## 2016-10-15 DIAGNOSIS — M25612 Stiffness of left shoulder, not elsewhere classified: Secondary | ICD-10-CM | POA: Insufficient documentation

## 2016-10-15 DIAGNOSIS — M25512 Pain in left shoulder: Secondary | ICD-10-CM

## 2016-10-15 DIAGNOSIS — Z483 Aftercare following surgery for neoplasm: Secondary | ICD-10-CM | POA: Insufficient documentation

## 2016-10-15 DIAGNOSIS — R600 Localized edema: Secondary | ICD-10-CM | POA: Diagnosis not present

## 2016-10-15 DIAGNOSIS — C50412 Malignant neoplasm of upper-outer quadrant of left female breast: Secondary | ICD-10-CM

## 2016-10-15 NOTE — Assessment & Plan Note (Signed)
Left lumpectomy 09/06/16: IDC grade 2, 3.4 cm, Margins Neg, 0/1 LN neg, T2N0 ER 90%, PR 70%, Ki-67 30%, HER-2 positive ratio 2.43 (stage 2 A)  Treatment Plan: 1. adjuvant chemotherapy with Herceptin (discussed TCHPfollowed by Herceptin-Perjetamaintenance to complete 1 year. 2. Adjuvant radiation therapy followed by 3. Adjuvant antiestrogen therapy ----------------------------------------------------------------------------- Current Treatment: TCHP cycle 1 day 8 Chemo Toxicities:  RTC with cycle 2

## 2016-10-15 NOTE — Patient Instructions (Addendum)
DO BOTH OF THESE SO THAT YOU FEEL A GENTLE, TOLERABLE STRETCH   Flexors Stick Stretch, Supine    Lie on back, stick in both hands above chest. Extend both arms over head as far as possible. Hold _5__ seconds. Repeat __5_ times per session. Do _3__ sessions per day.  Copyright  VHI. All rights reserved.  Inferior Capsule Stick Stretch I    Lie on your back, stand or sit, dowel in palm of arm to be stretched. Other arm, holding dowel in front of body, pushes outward and upward until arm being stretched is as high to the side as possible. Hold _5__ seconds. Repeat _5__ times per session. Do _3__ sessions per day.  Copyright  VHI. All rights reserved.

## 2016-10-15 NOTE — Therapy (Signed)
[place], [place], [address] Phone: [phone]   Fax:  [phone]  Physical Therapy Evaluation  Patient Details  Name: Stacy [name] MRN: [id number] Date of Birth: December 30, 1960 Referring Provider: Dr. [name]  Encounter Date: 10/15/2016      PT End of Session - 10/15/16 1627    Visit Number 1   Number of Visits 1   PT Start Time [id number]   PT Stop Time 1435   PT Time Calculation (min) 50 min   Activity Tolerance Patient tolerated treatment well   Behavior During Therapy [facility] for tasks assessed/performed  pt. tearful during first part of session      Past Medical History:  Diagnosis Date  . Anxiety   . Arthritis   . Complication of anesthesia    DIFFICULTY WAKING UP , LAST SURGERY NECK 2012 WAS FINE PER PT HERE AT [facility]  . Depression   . Fibromyalgia   . Headache    HX MIGRAINES    . Heart murmur   . Hypertension   . Malignant neoplasm of upper-outer quadrant of left female breast ([place]) 08/23/2016  . MVP (mitral valve prolapse)    AS INFANT  . Neck pain   . Vision abnormalities     Past Surgical History:  Procedure Laterality Date  . cervical fusiion  2012  . JOINT REPLACEMENT    . MITRAL VALVE REPAIR     MVP 1970  . PORTACATH PLACEMENT Right 09/06/2016   Procedure: INSERTION PORT-A-CATH;  Surgeon: [name], MD;  Location: [place];  Service: General;  Laterality: Right;  . RADIOACTIVE SEED GUIDED MASTECTOMY WITH AXILLARY SENTINEL LYMPH NODE BIOPSY Left 09/06/2016   Procedure: RADIOACTIVE SEED GUIDED LEFT BREAST LUMPECTOMY  WITH SENTINEL LYMPH NODE BIOPSY;  Surgeon: [name], MD;  Location: [place];  Service: General;  Laterality: Left;  . ROTATOR CUFF REPAIR Right 2012  . TOTAL HIP ARTHROPLASTY Right 09/14/2015   Procedure: RIGHT TOTAL HIP ARTHROPLASTY ANTERIOR APPROACH;  Surgeon: Leandrew Koyanagi, MD;  Location: [facility];  Service: Orthopedics;   Laterality: Right;  . TUBAL LIGATION  1988    There were no vitals filed for this visit.       Subjective Assessment - 10/15/16 1349    Subjective "Where they took the lymph nodes in my arm is larger than the other, and I can tell in certain shirts.  If I go to lift the arm it burns."   Patient is accompained by: Family member  husband   Pertinent History Pt. speaks in a whisper today, and says it is a reaction to the chemo.  Left breast cancer with lumpectomy, SLNB and Portacath placed 09/06/16.  Started chemo 10/09/16 and is to have six rounds and will have radiation after that.  HTN controlled with meds.  Right total hip replacement 09/14/15 which she reports was anterior approach and has no current restrictions.  Mitral valve prolapse with heart valve repair in 1970 or so; still has a heart murmur.  Neck fusion 2012 by Dr. Carloyn Manner, she thinks C4-5-6. Fibromyalgia--doesn't tolerate meds for this except ibuprofen; affected by stress and weather changes.   Patient Stated Goals To make my arm better.   Currently in Pain? Yes   Pain Score 0-No pain  up 2 when hurting   Pain Location Arm   Pain Orientation Left;Upper   Pain Descriptors / Indicators Burning   Aggravating Factors  raising arm up  Pain Relieving Factors arm at rest   Multiple Pain Sites Yes   Pain Score 5   Pain Location Groin   Pain Orientation Left   Pain Descriptors / Indicators Shooting   Pain Frequency Intermittent   Aggravating Factors  nothing   Pain Relieving Factors nothing            [facility] PT Assessment - 10/15/16 0001      Assessment   Medical Diagnosis left breast cancer, s/p lumpectomy with SLNB   Referring Provider Dr. [name]   Onset Date/Surgical Date 09/06/16   Hand Dominance Right   Prior Therapy none     Precautions   Precautions Other (comment)   Precaution Comments cancer precautions     Restrictions   Weight Bearing Restrictions No     Balance Screen   Has the patient fallen in  the past 6 months No   Has the patient had a decrease in activity level because of a fear of falling?  No   Is the patient reluctant to leave their home because of a fear of falling?  No     Home Environment   Living Environment Private residence   Living Arrangements Spouse/significant other   Type of [place] to enter     Prior Function   Level of Independence Independent   Vocation Full time employment   Vocation Requirements works at a bank, mostly desk work   Leisure no regular exercise     Cognition   Overall Cognitive Status Within Functional Limits for tasks assessed     Observation/Other Assessments   Observations both lumpectomy (upper outer breast) and node incisions are healing well     Posture/Postural Control   Posture/Postural Control Postural limitations   Postural Limitations Forward head     ROM / Strength   AROM / PROM / Strength AROM     AROM   AROM Assessment Site Shoulder   Right/Left Shoulder Right;Left   Right Shoulder Flexion 148 Degrees   Right Shoulder ABduction 180 Degrees   Right Shoulder Internal Rotation --  WFL in supine   Right Shoulder External Rotation 66 Degrees   Left Shoulder Flexion 139 Degrees  pulls   Left Shoulder ABduction 145 Degrees  pulls   Left Shoulder Internal Rotation --  WFL supine   Left Shoulder External Rotation 60 Degrees  supine     Ambulation/Gait   Ambulation/Gait Yes   Ambulation/Gait Assistance 7: Independent  per patient report           LYMPHEDEMA/ONCOLOGY QUESTIONNAIRE - 10/15/16 1411      Type   Cancer Type left breast     Surgeries   Lumpectomy Date 09/06/16   Sentinel Lymph Node Biopsy Date 09/06/16   Number Lymph Nodes Removed 1     Treatment   Active Chemotherapy Treatment Yes   Date 10/09/16   Active Radiation Treatment No  expects to do this later     Lymphedema Assessments   Lymphedema Assessments Upper extremities     Right Upper Extremity  Lymphedema   15 cm Proximal to Olecranon Process 39.1 cm   10 cm Proximal to Olecranon Process 34.8 cm   Olecranon Process 27.7 cm   10 cm Proximal to Ulnar Styloid Process 23.8 cm   Just Proximal to Ulnar Styloid Process 17.4 cm   Across Hand at [employer] 19.8 cm   At [place] of 2nd Digit 6.5 cm  Left Upper Extremity Lymphedema   15 cm Proximal to Olecranon Process 37.9 cm   10 cm Proximal to Olecranon Process 34.9 cm   Olecranon Process 27.7 cm   10 cm Proximal to Ulnar Styloid Process 24.6 cm   Just Proximal to Ulnar Styloid Process 16.9 cm   Across Hand at [employer] 19.4 cm   At [place] of 2nd Digit 6.5 cm                        PT Education - 10/15/16 1626    Education provided Yes   Education Details supine dowel shoulder flexion and abduction stretches; about ABC class; about wearing a bra that provides some compression at upper left flank   Person(s) Educated Patient;Spouse   Methods Explanation;Handout   Comprehension Verbalized understanding;Returned demonstration                [facility] Goals - 10/15/16 1640      CC Long Term Goal  #1   Title Pt. will be knowledgeable about home exercise program for shoulder ROM.   Status Achieved     CC Long Term Goal  #2   Title Pt. will be knowledgeable about lymphedema risk reduction practices   Baseline She will get this information when she attends After Breast Cancer class on 10/22/16.   Status New            Plan - 10/15/16 1628    Clinical Impression Statement Patient is s/p left lumpectomy and SLNB 09/06/16 for left breast cancer; she has just started chemo.  She was a bit tearful during evaluation today.  Her left shoulder AROM is decent for this soon after surgery, but it is not quite full and movement causes pain.  Her left arm circumference measurements are not significantly different from the right.  She feels swelling at her left upper arm, but I suspect this may rather  be swelling at and around her incision sites.  She was not previously shown ROM exercises to do and has not been doing them.  I think her discomfort with moving the arm with improve if she does some stretches (she was shown some today), and feeling of swelling should improve too.  I did encourage her to try to wear a bra that would cover the area better, such as a sports bra, and she agreed to try that.  Eval is moderate complexity because of personal factors (tearfulness) and comorbidities such as h/o neck fusion; evolving because she is still healing from surgery and starting chemo.  I did recommend she come to ABC class to learn more about lymphedema risk reduction and exercise.    Rehab Potential Good   PT Frequency One time visit  and ABC class   PT Treatment/Interventions ADLs/Self Care Home Management;Therapeutic exercise;Patient/family education   PT Next Visit Plan None at this time   PT Home Exercise Plan dowel stretches for shoulder ROM   Recommended Other Services attend free After Breast Cancer class about exercise and lymphedema   Consulted and Agree with Plan of Care Patient      Patient will benefit from skilled therapeutic intervention in order to improve the following deficits and impairments:  Decreased range of motion, Pain, Decreased knowledge of precautions, Decreased knowledge of use of DME  Visit Diagnosis: Stiffness of left shoulder, not elsewhere classified - Plan: PT plan of care cert/re-cert  Acute pain of left shoulder - Plan: PT plan  of care cert/re-cert  Aftercare following surgery for neoplasm - Plan: PT plan of care cert/re-cert  Localized edema - Plan: PT plan of care cert/re-cert     Problem List Patient Active Problem List   Diagnosis Date Noted  . Port catheter in place 10/09/2016  . GERD (gastroesophageal reflux disease) 10/09/2016  . Malignant neoplasm of upper-outer quadrant of left female breast ([place]) 08/23/2016  . Episodic altered awareness  08/01/2016  . Facial spasm 08/01/2016  . Right facial numbness 08/01/2016  . Depression 04/20/2016  . Arthritis 06/27/2015  . Insomnia 10/13/2009  . Essential hypertension 04/19/2009  . Fibromyalgia 02/18/2008  . MITRAL VALVE PROLAPSE, HX OF 02/18/2008    Stacy [name] 10/15/2016, 4:44 PM  [place], [place], [address] Phone: [phone]   Fax:  [phone]  Name: Stacy [name] MRN: [id number] Date of Birth: [birth year]  Serafina Royals, PT 10/15/16 4:44 PM

## 2016-10-16 ENCOUNTER — Encounter: Payer: Self-pay | Admitting: Hematology and Oncology

## 2016-10-16 ENCOUNTER — Ambulatory Visit (HOSPITAL_BASED_OUTPATIENT_CLINIC_OR_DEPARTMENT_OTHER): Payer: BLUE CROSS/BLUE SHIELD | Admitting: Hematology and Oncology

## 2016-10-16 ENCOUNTER — Other Ambulatory Visit (HOSPITAL_BASED_OUTPATIENT_CLINIC_OR_DEPARTMENT_OTHER): Payer: BLUE CROSS/BLUE SHIELD

## 2016-10-16 ENCOUNTER — Ambulatory Visit (HOSPITAL_BASED_OUTPATIENT_CLINIC_OR_DEPARTMENT_OTHER): Payer: BLUE CROSS/BLUE SHIELD

## 2016-10-16 DIAGNOSIS — Z170 Estrogen receptor positive status [ER+]: Secondary | ICD-10-CM | POA: Diagnosis not present

## 2016-10-16 DIAGNOSIS — Z452 Encounter for adjustment and management of vascular access device: Secondary | ICD-10-CM

## 2016-10-16 DIAGNOSIS — C50412 Malignant neoplasm of upper-outer quadrant of left female breast: Secondary | ICD-10-CM

## 2016-10-16 DIAGNOSIS — Z95828 Presence of other vascular implants and grafts: Secondary | ICD-10-CM

## 2016-10-16 LAB — CBC WITH DIFFERENTIAL/PLATELET
BASO%: 0.6 % (ref 0.0–2.0)
BASOS ABS: 0.1 10*3/uL (ref 0.0–0.1)
EOS%: 0.5 % (ref 0.0–7.0)
Eosinophils Absolute: 0.1 10*3/uL (ref 0.0–0.5)
HEMATOCRIT: 42.9 % (ref 34.8–46.6)
HGB: 14.7 g/dL (ref 11.6–15.9)
LYMPH#: 3.1 10*3/uL (ref 0.9–3.3)
LYMPH%: 12 % — ABNORMAL LOW (ref 14.0–49.7)
MCH: 29.7 pg (ref 25.1–34.0)
MCHC: 34.3 g/dL (ref 31.5–36.0)
MCV: 86.6 fL (ref 79.5–101.0)
MONO#: 1.5 10*3/uL — ABNORMAL HIGH (ref 0.1–0.9)
MONO%: 5.6 % (ref 0.0–14.0)
NEUT#: 21.2 10*3/uL — ABNORMAL HIGH (ref 1.5–6.5)
NEUT%: 81.3 % — ABNORMAL HIGH (ref 38.4–76.8)
Platelets: 294 10*3/uL (ref 145–400)
RBC: 4.95 10*6/uL (ref 3.70–5.45)
RDW: 13.2 % (ref 11.2–14.5)
WBC: 26.1 10*3/uL — ABNORMAL HIGH (ref 3.9–10.3)

## 2016-10-16 LAB — COMPREHENSIVE METABOLIC PANEL
ALT: 24 U/L (ref 0–55)
AST: 23 U/L (ref 5–34)
Albumin: 4 g/dL (ref 3.5–5.0)
Alkaline Phosphatase: 113 U/L (ref 40–150)
Anion Gap: 13 mEq/L — ABNORMAL HIGH (ref 3–11)
BUN: 11.9 mg/dL (ref 7.0–26.0)
CALCIUM: 10.3 mg/dL (ref 8.4–10.4)
CHLORIDE: 99 meq/L (ref 98–109)
CO2: 26 mEq/L (ref 22–29)
Creatinine: 1.1 mg/dL (ref 0.6–1.1)
EGFR: 61 mL/min/{1.73_m2} — ABNORMAL LOW (ref >90)
Glucose: 132 mg/dl (ref 70–140)
Potassium: 3.4 mEq/L — ABNORMAL LOW (ref 3.5–5.1)
Sodium: 137 mEq/L (ref 136–145)
Total Bilirubin: 0.29 mg/dL (ref 0.20–1.20)
Total Protein: 7.5 g/dL (ref 6.4–8.3)

## 2016-10-16 MED ORDER — SODIUM CHLORIDE 0.9% FLUSH
10.0000 mL | INTRAVENOUS | Status: DC | PRN
  Administered 2016-10-16: 10 mL via INTRAVENOUS
  Filled 2016-10-16: qty 10

## 2016-10-16 MED ORDER — HEPARIN SOD (PORK) LOCK FLUSH 100 UNIT/ML IV SOLN
500.0000 [IU] | Freq: Once | INTRAVENOUS | Status: AC | PRN
  Administered 2016-10-16: 500 [IU] via INTRAVENOUS
  Filled 2016-10-16: qty 5

## 2016-10-16 MED ORDER — PANTOPRAZOLE SODIUM 40 MG PO TBEC: 40.0000 mg | DELAYED_RELEASE_TABLET | Freq: Every day | ORAL | 3 refills | Status: DC

## 2016-10-16 MED FILL — PANTOPRAZOLE SOD DR 40 MG T: 40 | 30 days supply | Qty: 30 | Fill #0

## 2016-10-16 NOTE — Progress Notes (Signed)
Patient Care Team: [name], NP as PCP - General (Internal Medicine) [name], MD as Consulting Physician (General Surgery) Nicholas Lose, MD as Consulting Physician (Hematology and Oncology) Gery Pray, MD as Consulting Physician (Radiation Oncology)  DIAGNOSIS:  Encounter Diagnosis  Name Primary?  . Malignant neoplasm of upper-outer quadrant of left breast in female, estrogen receptor positive ([place])     SUMMARY OF ONCOLOGIC HISTORY:   Malignant neoplasm of upper-outer quadrant of left female breast ([place])   08/21/2016 Initial Diagnosis    Screening detected left breast mass UOQ at 1:00: 1.2 x 1 x 0.5 cm, axilla negative Left breast biopsy 1:00: IDC with DCIS, grade 1, ER 90%, PR 70%, Ki-67 30%, HER-2 positive ratio 2.43, T1c N0 stage IA clinical stage      09/06/2016 Surgery    Left lumpectomy: IDC grade 2, 3.4 cm, Margins Neg, 0/1 LN neg, ER 90%, PR 70%, Ki-67 30%, HER-2 positive ratio 2.43 T2N0 (stage 2 A)      10/09/2016 -  Chemotherapy    TCH Perjeta 6 cycles followed by Herceptin Perjeta maintenance for 1 year       CHIEF COMPLIANT: Cycle 1 day 8 TCH Perjeta  INTERVAL HISTORY: Stacy [name] is a 56 year old with above-mentioned history of left breast cancer currently on adjuvant chemotherapy with TCH Perjeta. She received first cycle of chemotherapy week ago. She had nausea and vomiting from chemotherapy. When she presented to Neulasta injection on it was infusing she became extremely erythematous and had intense itching throat her body. Joint emergency room and she was given Benadryl. She felt better and was able to be discharged home. She has had chronic cough prior to starting chemotherapy and the cough continues to be a problem. She was previously treated with antibiotics without any benefit. When she took Claritin and appears to have made it worse because it was drying her throat. Because of this cough she lost her voice.  REVIEW OF SYSTEMS:     Constitutional: Denies fevers, chills or abnormal weight loss Eyes: Denies blurriness of vision Ears, nose, mouth, throat, and face: Denies mucositis or sore throat Respiratory: Nonproductive cough Cardiovascular: Denies palpitation, chest discomfort Gastrointestinal:  Acid reflux Skin: Denies abnormal skin rashes Lymphatics: Denies new lymphadenopathy or easy bruising Neurological:Denies numbness, tingling or new weaknesses Behavioral/Psych: Mood is stable, no new changes  Extremities: No lower extremity edema  All other systems were reviewed with the patient and are negative.  I have reviewed the past medical history, past surgical history, social history and family history with the patient and they are unchanged from previous note.  ALLERGIES:  is allergic to naproxen; erythromycin; tramadol hcl; and gabapentin.  MEDICATIONS:  Current Outpatient Prescriptions  Medication Sig Dispense Refill  . Calcium Carbonate-Vitamin D3 (CALCIUM 600/VITAMIN D) 600-400 MG-UNIT TABS Take 1 tablet by mouth daily.    Marland Kitchen dexamethasone (DECADRON) 4 MG tablet Take 1 tablet (4 mg total) by mouth daily. Take 1 tablet daily before chemotherapy and 1 tablet the day after with food. 30 tablet 1  . diphenhydrAMINE (BENADRYL) 25 MG tablet Take 2 tablets (50 mg total) by mouth every 4 (four) hours as needed for itching. (Patient not taking: Reported on 10/15/2016) 20 tablet 0  . hydrochlorothiazide (HYDRODIURIL) 25 MG tablet TAKE 1 TABLET BY MOUTH EVERY DAY 30 tablet 5  . HYDROcodone-homatropine (HYCODAN) 5-1.5 MG/5ML syrup Take 5 mLs by mouth every 6 (six) hours as needed for cough. (Patient not taking: Reported on 10/15/2016) 120 mL 0  .  ibuprofen (ADVIL,MOTRIN) 200 MG tablet Take 800 mg by mouth every 4 (four) hours as needed.     . lidocaine-prilocaine (EMLA) cream Apply to affected area once 30 g 3  . LORazepam (ATIVAN) 0.5 MG tablet Take 1 tablet (0.5 mg total) by mouth every 6 (six) hours as needed (Nausea or  vomiting). 30 tablet 0  . ondansetron (ZOFRAN) 8 MG tablet Take 1 tablet (8 mg total) by mouth 2 (two) times daily as needed for refractory nausea / vomiting. Start on day 3 after chemo. 30 tablet 1  . pantoprazole (PROTONIX) 40 MG tablet Take 1 tablet (40 mg total) by mouth daily. 30 tablet 3  . prochlorperazine (COMPAZINE) 10 MG tablet Take 1 tablet (10 mg total) by mouth every 6 (six) hours as needed (Nausea or vomiting). 30 tablet 1  . ranitidine (ZANTAC) 150 MG tablet Take 1 tablet (150 mg total) by mouth 2 (two) times daily. x4 days (Patient not taking: Reported on 10/15/2016) 8 tablet 0   No current facility-administered medications for this visit.     PHYSICAL EXAMINATION: ECOG PERFORMANCE STATUS: 1 - Symptomatic but completely ambulatory  Vitals:   10/16/16 1313  BP: (!) 152/94  Pulse: (!) 103  Resp: 18  Temp: 98 F (36.7 C)   Filed Weights   10/16/16 1313  Weight: 220 lb 14.4 oz (100.2 kg)    GENERAL:alert, no distress and comfortable SKIN: skin color, texture, turgor are normal, no rashes or significant lesions EYES: normal, Conjunctiva are pink and non-injected, sclera clear OROPHARYNX:no exudate, no erythema and lips, buccal mucosa, and tongue normal  NECK: supple, thyroid normal size, non-tender, without nodularity LYMPH:  no palpable lymphadenopathy in the cervical, axillary or inguinal LUNGS: clear to auscultation and percussion with normal breathing effort HEART: regular rate & rhythm and no murmurs and no lower extremity edema ABDOMEN:abdomen soft, non-tender and normal bowel sounds MUSCULOSKELETAL:no cyanosis of digits and no clubbing  NEURO: alert & oriented x 3 with fluent speech, no focal motor/sensory deficits EXTREMITIES: No lower extremity edema   LABORATORY DATA:  I have reviewed the data as listed   Chemistry      Component Value Date/Time   NA 137 10/16/2016 1241   K 3.4 (L) 10/16/2016 1241   CL 99 04/20/2016 1417   CO2 26 10/16/2016 1241    BUN 11.9 10/16/2016 1241   CREATININE 1.1 10/16/2016 1241      Component Value Date/Time   CALCIUM 10.3 10/16/2016 1241   ALKPHOS 113 10/16/2016 1241   AST 23 10/16/2016 1241   ALT 24 10/16/2016 1241   BILITOT 0.29 10/16/2016 1241       Lab Results  Component Value Date   WBC 26.1 (H) 10/16/2016   HGB 14.7 10/16/2016   HCT 42.9 10/16/2016   MCV 86.6 10/16/2016   PLT 294 10/16/2016   NEUTROABS 21.2 (H) 10/16/2016    ASSESSMENT & PLAN:  Malignant neoplasm of upper-outer quadrant of left female breast ([facility]) Left lumpectomy 09/06/16: IDC grade 2, 3.4 cm, Margins Neg, 0/1 LN neg, T2N0 ER 90%, PR 70%, Ki-67 30%, HER-2 positive ratio 2.43 (stage 2 A)  Treatment Plan: 1. adjuvant chemotherapy with Herceptin (discussed TCHPfollowed by Herceptin-Perjetamaintenance to complete 1 year. 2. Adjuvant radiation therapy followed by 3. Adjuvant antiestrogen therapy ----------------------------------------------------------------------------- Current Treatment: TCHP cycle 1 day 8 Chemo Toxicities: 1. Severe cough: I encouraged her to take Mucinex. This could be related to acid reflux. I sent a prescription for protonix. 2. nausea and vomiting: Due  to chemotherapy, I will reduce the dosage of Cycle of chemotherapy. 3. Allergy to Neulasta: We will discontinue further Neulasta treatments. We will see if she can maintain her counts with the lower dosage of chemotherapy from cycle 2. 4. Profound lack of taste and discomfort in the mouth 5. Generalized fatigue  Plan: Reduce the dosage with cycle 2 chemotherapy RTC with cycle 2   I spent 25 minutes talking to the patient of which more than half was spent in counseling and coordination of care.  No orders of the defined types were placed in this encounter.  The patient has a good understanding of the overall plan. she agrees with it. she will call with any problems that may develop before the next visit here.   Rulon Eisenmenger,  MD 10/16/16

## 2016-10-16 NOTE — Patient Instructions (Signed)

## 2016-10-18 ENCOUNTER — Ambulatory Visit (INDEPENDENT_AMBULATORY_CARE_PROVIDER_SITE_OTHER): Payer: BLUE CROSS/BLUE SHIELD | Admitting: Neurology

## 2016-10-18 ENCOUNTER — Encounter: Payer: Self-pay | Admitting: Neurology

## 2016-10-18 VITALS — BP 108/84 | HR 76 | Ht 64.0 in | Wt 220.0 lb

## 2016-10-18 DIAGNOSIS — R404 Transient alteration of awareness: Secondary | ICD-10-CM | POA: Diagnosis not present

## 2016-10-18 DIAGNOSIS — F32A Depression, unspecified: Secondary | ICD-10-CM

## 2016-10-18 DIAGNOSIS — G40109 Localization-related (focal) (partial) symptomatic epilepsy and epileptic syndromes with simple partial seizures, not intractable, without status epilepticus: Secondary | ICD-10-CM | POA: Insufficient documentation

## 2016-10-18 DIAGNOSIS — G5139 Clonic hemifacial spasm, unspecified: Secondary | ICD-10-CM

## 2016-10-18 DIAGNOSIS — F329 Major depressive disorder, single episode, unspecified: Secondary | ICD-10-CM | POA: Diagnosis not present

## 2016-10-18 DIAGNOSIS — M797 Fibromyalgia: Secondary | ICD-10-CM | POA: Diagnosis not present

## 2016-10-18 DIAGNOSIS — G513 Clonic hemifacial spasm: Secondary | ICD-10-CM

## 2016-10-18 NOTE — Progress Notes (Signed)
[facility]  PATIENT: Stacy [name] DOB: 07/26/1961  REFERRING DOCTOR OR PCP:  Webb Silversmith SOURCE: patient, records from Ms. Baity, imaging and lab reports, CT scan on the PACS.  _________________________________   HISTORICAL  CHIEF COMPLAINT:  Chief Complaint  Patient presents with  . Numbness    Sts. right sided facial numbness/tingling/spasms are less frequent, but she is now having similar episodes left side of her face, onset one month ago/fim  . Facial Spasms    HISTORY OF PRESENT ILLNESS:  Stacy [name] is a 56 year old woman who has had episodes of right facial spasming for the past year..    Compared to last visit, she feels the frequency is better.  These last about a minute or two and occur about 3-4 a week.   They occur on her right.    They can be triggered by changing position.      She has shown me a video of her spasm as he was uncertain. I'll obtain an EEG. It was mildly abnormal. There were no seizures or spikes or sharp waves. However, she did have a little slowing on the left consistent with a higher susceptibility for seizures.   We prescribed Keppra but she was in the midst of all of her breast cancer issues and she opted not to start. She is reluctant to add any more medications as she is already taking several.  She also was diagnosed with Stage 2 breast cancer (Lymph nodes clear by her report)and was placed on herceptin, taxotere and another medication (Dr. Lindi Adie / Elvina Sidle)  A typical episode will last 5 minutes but they can last anywhere from 2-10 minutes. Before an episode she feels lightheadedness for a short period of time. Then she starts to feel numbness that starts around the lips on the right and spreads up to the rest of the face. Then, the face has tonic spasms with her right eye closed and the corner of the right side of her mouth up. This can be a little bit painful for her when it occurs. After a few minutes the spasm begins  to improve and a couple minutes later the numbness gets better. During the episodes, she feels strange does not lose consciousness and she is able to understand what people are saying he can form sentences though she feels her voice is altered because of the spasm.   Spasms are not associated with loss of consciousness and she never gets symptoms in the right arm or leg or the other side of her body. She does not get incontinence or tongue biting associated with the episode.  She has fibromyalgia but is currently not on any treatment.    A review of her medications does not show anything that might lower the seizure threshold.  REVIEW OF SYSTEMS: Constitutional: No fevers, chills, sweats, or change in appetite.   She has insomnia and occasionally feel sleepy. Eyes: No visual changes, double vision, eye pain Ear, nose and throat: No hearing loss, ear pain, nasal congestion, sore throat Cardiovascular: No chest pain, palpitations.   History of valve surgery as a child. Respiratory: No shortness of breath at rest or with exertion.   No wheezes GastrointestinaI: No nausea, vomiting, diarrhea, abdominal pain, fecal incontinence Genitourinary: No dysuria, urinary retention or frequency.  No nocturia. Musculoskeletal: She has generalized myalgia.  No neck pain, back pain Integumentary: She was diagnosed with breast ca and is on chemo.   No rash, pruritus, skin lesions Neurological:  as above Psychiatric: No depression at this time.  No anxiety Endocrine: No palpitations, diaphoresis, change in appetite, change in weigh or increased thirst Hematologic/Lymphatic: No anemia, purpura, petechiae. Allergic/Immunologic: No itchy/runny eyes, nasal congestion, recent allergic reactions, rashes  ALLERGIES: Allergies  Allergen Reactions  . Naproxen Palpitations  . Erythromycin Nausea And Vomiting  . Tramadol Hcl Other (See Comments)    Headaches  . Gabapentin Swelling    "makes me feel detached"     HOME MEDICATIONS:  Current Outpatient Prescriptions:  .  dexamethasone (DECADRON) 4 MG tablet, Take 1 tablet (4 mg total) by mouth daily. Take 1 tablet daily before chemotherapy and 1 tablet the day after with food., Disp: 30 tablet, Rfl: 1 .  hydrochlorothiazide (HYDRODIURIL) 25 MG tablet, TAKE 1 TABLET BY MOUTH EVERY DAY, Disp: 30 tablet, Rfl: 5 .  ibuprofen (ADVIL,MOTRIN) 200 MG tablet, Take 800 mg by mouth every 4 (four) hours as needed. , Disp: , Rfl:  .  lidocaine-prilocaine (EMLA) cream, Apply to affected area once, Disp: 30 g, Rfl: 3 .  LORazepam (ATIVAN) 0.5 MG tablet, Take 1 tablet (0.5 mg total) by mouth every 6 (six) hours as needed (Nausea or vomiting)., Disp: 30 tablet, Rfl: 0 .  ondansetron (ZOFRAN) 8 MG tablet, Take 1 tablet (8 mg total) by mouth 2 (two) times daily as needed for refractory nausea / vomiting. Start on day 3 after chemo., Disp: 30 tablet, Rfl: 1 .  pantoprazole (PROTONIX) 40 MG tablet, Take 1 tablet (40 mg total) by mouth daily., Disp: 30 tablet, Rfl: 3 .  prochlorperazine (COMPAZINE) 10 MG tablet, Take 1 tablet (10 mg total) by mouth every 6 (six) hours as needed (Nausea or vomiting)., Disp: 30 tablet, Rfl: 1 .  ranitidine (ZANTAC) 150 MG tablet, Take 1 tablet (150 mg total) by mouth 2 (two) times daily. x4 days (Patient not taking: Reported on 10/15/2016), Disp: 8 tablet, Rfl: 0  PAST MEDICAL HISTORY: Past Medical History:  Diagnosis Date  . Anxiety   . Arthritis   . Complication of anesthesia    DIFFICULTY WAKING UP , LAST SURGERY NECK 2012 WAS FINE PER PT HERE AT [facility]  . Depression   . Fibromyalgia   . Headache    HX MIGRAINES    . Heart murmur   . Hypertension   . Malignant neoplasm of upper-outer quadrant of left female breast ([place]) 08/23/2016  . MVP (mitral valve prolapse)    AS INFANT  . Neck pain   . Vision abnormalities     PAST SURGICAL HISTORY: Past Surgical History:  Procedure Laterality Date  . cervical fusiion  2012  . JOINT  REPLACEMENT    . MITRAL VALVE REPAIR     MVP 1970  . PORTACATH PLACEMENT Right 09/06/2016   Procedure: INSERTION PORT-A-CATH;  Surgeon: [name], MD;  Location: [place];  Service: General;  Laterality: Right;  . RADIOACTIVE SEED GUIDED MASTECTOMY WITH AXILLARY SENTINEL LYMPH NODE BIOPSY Left 09/06/2016   Procedure: RADIOACTIVE SEED GUIDED LEFT BREAST LUMPECTOMY  WITH SENTINEL LYMPH NODE BIOPSY;  Surgeon: [name], MD;  Location: [place];  Service: General;  Laterality: Left;  . ROTATOR CUFF REPAIR Right 2012  . TOTAL HIP ARTHROPLASTY Right 09/14/2015   Procedure: RIGHT TOTAL HIP ARTHROPLASTY ANTERIOR APPROACH;  Surgeon: Leandrew Koyanagi, MD;  Location: [place];  Service: Orthopedics;  Laterality: Right;  . TUBAL LIGATION  1988    FAMILY HISTORY: Family History  Problem Relation Age of Onset  .  Adopted: Yes    SOCIAL HISTORY:  Social History   Social History  . Marital status: Married    Spouse name: N/A  . Number of children: N/A  . Years of education: N/A   Occupational History  . Not on file.   Social History Main Topics  . Smoking status: Current Every Day Smoker    Packs/day: 0.25    Types: Cigarettes  . Smokeless tobacco: Never Used  . Alcohol use No  . Drug use: No  . Sexual activity: Yes    Birth control/ protection: Post-menopausal   Other Topics Concern  . Not on file   Social History Narrative  . No narrative on file     PHYSICAL EXAM  Vitals:   10/18/16 1552  BP: 108/84  Pulse: 76  Weight: 220 lb (99.8 kg)  Height: 5\' 4"  (1.626 m)    Body mass index is 37.76 kg/m.   General: The patient is well-developed and well-nourished and in no acute distress   Neurologic Exam  Mental status: The patient is alert and oriented x 3 at the time of the examination. The patient has apparent normal recent and remote memory, with an apparently normal attention span and concentration ability.   Speech is  normal.  Cranial nerves: Extraocular movements are full. Pupils are equal, round, and reactive to light and accomodation.  Visual fields are full.  Facial symmetry is present. There is good facial sensation to soft touch bilaterally.Facial strength is normal.  Trapezius and sternocleidomastoid strength is normal. No dysarthria is noted.  The tongue is midline, and the patient has symmetric elevation of the soft palate. No obvious hearing deficits are noted.  Motor:  Muscle bulk is normal.   Tone is normal. Strength is  5 / 5 in all 4 extremities.   Sensory: Sensory testing is intact to vibration and touch sensation in all 4 extremities.  Coordination: Cerebellar testing reveals good finger-nose-finger and heel-to-shin bilaterally.  Gait and station: Station is normal.   Gait is normal. Tandem gait is normal. Romberg is negative.   Reflexes: Deep tendon reflexes are symmetric and normal bilaterally.        DIAGNOSTIC DATA (LABS, IMAGING, TESTING) - I reviewed patient records, labs, notes, testing and imaging myself where available.  Lab Results  Component Value Date   WBC 26.1 (H) 10/16/2016   HGB 14.7 10/16/2016   HCT 42.9 10/16/2016   MCV 86.6 10/16/2016   PLT 294 10/16/2016      Component Value Date/Time   NA 137 10/16/2016 1241   K 3.4 (L) 10/16/2016 1241   CL 99 04/20/2016 1417   CO2 26 10/16/2016 1241   GLUCOSE 132 10/16/2016 1241   BUN 11.9 10/16/2016 1241   CREATININE 1.1 10/16/2016 1241   CALCIUM 10.3 10/16/2016 1241   PROT 7.5 10/16/2016 1241   ALBUMIN 4.0 10/16/2016 1241   AST 23 10/16/2016 1241   ALT 24 10/16/2016 1241   ALKPHOS 113 10/16/2016 1241   BILITOT 0.29 10/16/2016 1241   GFRNONAA >60 03/22/2016 1847   GFRAA >60 03/22/2016 1847   Lab Results  Component Value Date   CHOL 172 04/20/2016   HDL 59.10 04/20/2016   LDLCALC 88 04/20/2016   TRIG 127.0 04/20/2016   CHOLHDL 3 04/20/2016   Lab Results  Component Value Date   HGBA1C 5.6 04/20/2016    Lab Results  Component Value Date   VITAMINB12 337 04/20/2016   Lab Results  Component Value Date   TSH  1.32 04/20/2016       ASSESSMENT AND PLAN  Fibromyalgia  Depression, unspecified depression type  Episodic altered awareness  Facial spasm   1.   We discussed starting Keppra for the spasms that might be simple partial epilepsy but she would prefer not to start another medication at this time and I will hold off at this time.   However, if they worsen or if any of them generalizes, we will start Keppra 500 mg po bid and titrate up if necessary.   2.   She will return to see me in 6 months or sooner if there are new or worsening neurologic symptoms.   Shortly advised her to call us if she thinks her seizure/spasm spell  frequency has increased or if the spells have changed.  Richard A. Felecia Shelling, MD, PhD [id number], [id number] PM Certified in Neurology, Clinical Neurophysiology, Sleep Medicine, Pain Medicine and Neuroimaging  [facility] [address], [place], [place] [address] [phone]

## 2016-10-29 ENCOUNTER — Other Ambulatory Visit: Payer: Self-pay | Admitting: Emergency Medicine

## 2016-10-29 DIAGNOSIS — Z170 Estrogen receptor positive status [ER+]: Principal | ICD-10-CM

## 2016-10-29 DIAGNOSIS — C50412 Malignant neoplasm of upper-outer quadrant of left female breast: Secondary | ICD-10-CM

## 2016-10-30 ENCOUNTER — Ambulatory Visit (HOSPITAL_BASED_OUTPATIENT_CLINIC_OR_DEPARTMENT_OTHER): Payer: BLUE CROSS/BLUE SHIELD

## 2016-10-30 ENCOUNTER — Other Ambulatory Visit (HOSPITAL_BASED_OUTPATIENT_CLINIC_OR_DEPARTMENT_OTHER): Payer: BLUE CROSS/BLUE SHIELD

## 2016-10-30 ENCOUNTER — Encounter: Payer: Self-pay | Admitting: Hematology and Oncology

## 2016-10-30 ENCOUNTER — Ambulatory Visit (HOSPITAL_BASED_OUTPATIENT_CLINIC_OR_DEPARTMENT_OTHER): Payer: BLUE CROSS/BLUE SHIELD | Admitting: Hematology and Oncology

## 2016-10-30 ENCOUNTER — Ambulatory Visit: Payer: BLUE CROSS/BLUE SHIELD

## 2016-10-30 DIAGNOSIS — C50412 Malignant neoplasm of upper-outer quadrant of left female breast: Secondary | ICD-10-CM

## 2016-10-30 DIAGNOSIS — Z170 Estrogen receptor positive status [ER+]: Principal | ICD-10-CM

## 2016-10-30 DIAGNOSIS — Z95828 Presence of other vascular implants and grafts: Secondary | ICD-10-CM

## 2016-10-30 DIAGNOSIS — Z5112 Encounter for antineoplastic immunotherapy: Secondary | ICD-10-CM

## 2016-10-30 DIAGNOSIS — Z5111 Encounter for antineoplastic chemotherapy: Secondary | ICD-10-CM | POA: Diagnosis not present

## 2016-10-30 LAB — CBC WITH DIFFERENTIAL/PLATELET
BASO%: 0.8 % (ref 0.0–2.0)
BASOS ABS: 0.1 10*3/uL (ref 0.0–0.1)
EOS ABS: 0 10*3/uL (ref 0.0–0.5)
EOS%: 0.2 % (ref 0.0–7.0)
HEMATOCRIT: 34.9 % (ref 34.8–46.6)
HEMOGLOBIN: 11.9 g/dL (ref 11.6–15.9)
LYMPH#: 2.3 10*3/uL (ref 0.9–3.3)
LYMPH%: 17.9 % (ref 14.0–49.7)
MCH: 30.3 pg (ref 25.1–34.0)
MCHC: 34.1 g/dL (ref 31.5–36.0)
MCV: 88.9 fL (ref 79.5–101.0)
MONO#: 0.9 10*3/uL (ref 0.1–0.9)
MONO%: 6.8 % (ref 0.0–14.0)
NEUT#: 9.7 10*3/uL — ABNORMAL HIGH (ref 1.5–6.5)
NEUT%: 74.3 % (ref 38.4–76.8)
PLATELETS: 369 10*3/uL (ref 145–400)
RBC: 3.93 10*6/uL (ref 3.70–5.45)
RDW: 14.2 % (ref 11.2–14.5)
WBC: 13.1 10*3/uL — ABNORMAL HIGH (ref 3.9–10.3)

## 2016-10-30 LAB — COMPREHENSIVE METABOLIC PANEL
ALT: 12 U/L (ref 0–55)
AST: 12 U/L (ref 5–34)
Albumin: 3.7 g/dL (ref 3.5–5.0)
Alkaline Phosphatase: 69 U/L (ref 40–150)
Anion Gap: 11 mEq/L (ref 3–11)
BILIRUBIN TOTAL: 0.41 mg/dL (ref 0.20–1.20)
BUN: 17.5 mg/dL (ref 7.0–26.0)
CHLORIDE: 108 meq/L (ref 98–109)
CO2: 22 meq/L (ref 22–29)
Calcium: 9.6 mg/dL (ref 8.4–10.4)
Creatinine: 1 mg/dL (ref 0.6–1.1)
EGFR: 65 mL/min/{1.73_m2} — AB (ref >90)
GLUCOSE: 116 mg/dL (ref 70–140)
POTASSIUM: 3.6 meq/L (ref 3.5–5.1)
SODIUM: 141 meq/L (ref 136–145)
TOTAL PROTEIN: 7.2 g/dL (ref 6.4–8.3)

## 2016-10-30 MED ORDER — CARBOPLATIN CHEMO INJECTION 600 MG/60ML
530.0000 mg | Freq: Once | INTRAVENOUS | Status: AC
  Administered 2016-10-30: 530 mg via INTRAVENOUS
  Filled 2016-10-30: qty 53

## 2016-10-30 MED ORDER — SODIUM CHLORIDE 0.9 % IV SOLN
Freq: Once | INTRAVENOUS | Status: AC
  Administered 2016-10-30: 12:00:00 via INTRAVENOUS

## 2016-10-30 MED ORDER — SODIUM CHLORIDE 0.9% FLUSH
10.0000 mL | INTRAVENOUS | Status: DC | PRN
  Administered 2016-10-30: 10 mL
  Filled 2016-10-30: qty 10

## 2016-10-30 MED ORDER — PALONOSETRON HCL INJECTION 0.25 MG/5ML
0.2500 mg | Freq: Once | INTRAVENOUS | Status: AC
  Administered 2016-10-30: 0.25 mg via INTRAVENOUS

## 2016-10-30 MED ORDER — DOCETAXEL CHEMO INJECTION 160 MG/16ML
65.0000 mg/m2 | Freq: Once | INTRAVENOUS | Status: AC
  Administered 2016-10-30: 140 mg via INTRAVENOUS
  Filled 2016-10-30: qty 14

## 2016-10-30 MED ORDER — DIPHENHYDRAMINE HCL 25 MG PO CAPS
ORAL_CAPSULE | ORAL | Status: AC
  Filled 2016-10-30: qty 2

## 2016-10-30 MED ORDER — ACETAMINOPHEN 325 MG PO TABS
650.0000 mg | ORAL_TABLET | Freq: Once | ORAL | Status: AC
  Administered 2016-10-30: 650 mg via ORAL

## 2016-10-30 MED ORDER — TRASTUZUMAB CHEMO 150 MG IV SOLR
6.0000 mg/kg | Freq: Once | INTRAVENOUS | Status: AC
  Administered 2016-10-30: 630 mg via INTRAVENOUS
  Filled 2016-10-30: qty 30

## 2016-10-30 MED ORDER — SODIUM CHLORIDE 0.9 % IV SOLN
Freq: Once | INTRAVENOUS | Status: AC
  Administered 2016-10-30: 12:00:00 via INTRAVENOUS
  Filled 2016-10-30: qty 5

## 2016-10-30 MED ORDER — SODIUM CHLORIDE 0.9 % IV SOLN
420.0000 mg | Freq: Once | INTRAVENOUS | Status: AC
  Administered 2016-10-30: 420 mg via INTRAVENOUS
  Filled 2016-10-30: qty 14

## 2016-10-30 MED ORDER — SODIUM CHLORIDE 0.9% FLUSH
10.0000 mL | INTRAVENOUS | Status: DC | PRN
  Administered 2016-10-30: 10 mL via INTRAVENOUS
  Filled 2016-10-30: qty 10

## 2016-10-30 MED ORDER — ALUM & MAG HYDROXIDE-SIMETH 200-200-20 MG/5ML PO SUSP
15.0000 mL | Freq: Once | ORAL | Status: AC
  Administered 2016-10-30: 15 mL via ORAL
  Filled 2016-10-30: qty 30

## 2016-10-30 MED ORDER — HEPARIN SOD (PORK) LOCK FLUSH 100 UNIT/ML IV SOLN
500.0000 [IU] | Freq: Once | INTRAVENOUS | Status: AC | PRN
  Administered 2016-10-30: 500 [IU]
  Filled 2016-10-30: qty 5

## 2016-10-30 MED ORDER — ACETAMINOPHEN 325 MG PO TABS
ORAL_TABLET | ORAL | Status: AC
  Filled 2016-10-30: qty 2

## 2016-10-30 MED ORDER — DIPHENHYDRAMINE HCL 25 MG PO CAPS
50.0000 mg | ORAL_CAPSULE | Freq: Once | ORAL | Status: AC
  Administered 2016-10-30: 50 mg via ORAL

## 2016-10-30 MED ORDER — PALONOSETRON HCL INJECTION 0.25 MG/5ML
INTRAVENOUS | Status: AC
Start: 2016-10-30 — End: 2016-10-30
  Filled 2016-10-30: qty 5

## 2016-10-30 MED FILL — ONDANSETRON HCL 8 MG TABLET: 8 | 15 days supply | Qty: 30 | Fill #1

## 2016-10-30 NOTE — Patient Instructions (Addendum)
[facility] Discharge Instructions for Patients Receiving Chemotherapy  Today you received the following chemotherapy agents Herceptin/Perjeta/Taxotere/Carboplatin  To help prevent nausea and vomiting after your treatment, we encourage you to take your nausea medication as prescribed.  If you develop nausea and vomiting that is not controlled by your nausea medication, call the clinic.   BELOW ARE SYMPTOMS THAT SHOULD BE REPORTED IMMEDIATELY:  *FEVER GREATER THAN 100.5 F  *CHILLS WITH OR WITHOUT FEVER  NAUSEA AND VOMITING THAT IS NOT CONTROLLED WITH YOUR NAUSEA MEDICATION  *UNUSUAL SHORTNESS OF BREATH  *UNUSUAL BRUISING OR BLEEDING  TENDERNESS IN MOUTH AND THROAT WITH OR WITHOUT PRESENCE OF ULCERS  *URINARY PROBLEMS  *BOWEL PROBLEMS  UNUSUAL RASH Items with * indicate a potential emergency and should be followed up as soon as possible.  Feel free to call the clinic you have any questions or concerns. The clinic phone number is [phone].  Please show the CHEMO ALERT CARD at check-in to the Emergency Department and triage nurse.   

## 2016-10-30 NOTE — Progress Notes (Signed)
Patient Care Team: [name], NP as PCP - General (Internal Medicine) [name], MD as Consulting Physician (General Surgery) Nicholas Lose, MD as Consulting Physician (Hematology and Oncology) Gery Pray, MD as Consulting Physician (Radiation Oncology)  DIAGNOSIS:  Encounter Diagnosis  Name Primary?  . Malignant neoplasm of upper-outer quadrant of left breast in female, estrogen receptor positive ([place])     SUMMARY OF ONCOLOGIC HISTORY:   Malignant neoplasm of upper-outer quadrant of left female breast ([place])   08/21/2016 Initial Diagnosis    Screening detected left breast mass UOQ at 1:00: 1.2 x 1 x 0.5 cm, axilla negative Left breast biopsy 1:00: IDC with DCIS, grade 1, ER 90%, PR 70%, Ki-67 30%, HER-2 positive ratio 2.43, T1c N0 stage IA clinical stage      09/06/2016 Surgery    Left lumpectomy: IDC grade 2, 3.4 cm, Margins Neg, 0/1 LN neg, ER 90%, PR 70%, Ki-67 30%, HER-2 positive ratio 2.43 T2N0 (stage 2 A)      10/09/2016 -  Chemotherapy    TCH Perjeta 6 cycles followed by Herceptin Perjeta maintenance for 1 year       CHIEF COMPLIANT: Cycle 2 TCH Perjeta  INTERVAL HISTORY: Stacy [name] is a 56 year old with above-mentioned history left breast cancer treated with lumpectomy and is currently on adjuvant chemotherapy with [place]. Today is cycle 2 of treatment. After cycle 1 she had profound fatigue nausea vomiting and decreased appetite and loss of taste. All of the symptoms finally improved about a week ago. She also had a reaction to Neulasta. Because of this she is unable to receive any further Neulasta. We will also reduce the dosage of chemotherapy today.  REVIEW OF SYSTEMS:   Constitutional: Denies fevers, chills or abnormal weight loss, complains of fatigue Eyes: Denies blurriness of vision Ears, nose, mouth, throat, and face: Denies mucositis or sore throat Respiratory: Denies cough, dyspnea or wheezes Cardiovascular: Denies palpitation, chest  discomfort Gastrointestinal: Nausea has improved Skin: Denies abnormal skin rashes Lymphatics: Denies new lymphadenopathy or easy bruising Neurological:Denies numbness, tingling or new weaknesses Behavioral/Psych: Mood is stable, no new changes  Extremities: No lower extremity edema Breast:  denies any pain or lumps or nodules in either breasts All other systems were reviewed with the patient and are negative.  I have reviewed the past medical history, past surgical history, social history and family history with the patient and they are unchanged from previous note.  ALLERGIES:  is allergic to naproxen; erythromycin; tramadol hcl; and gabapentin.  MEDICATIONS:  Current Outpatient Prescriptions  Medication Sig Dispense Refill  . dexamethasone (DECADRON) 4 MG tablet Take 1 tablet (4 mg total) by mouth daily. Take 1 tablet daily before chemotherapy and 1 tablet the day after with food. 30 tablet 1  . hydrochlorothiazide (HYDRODIURIL) 25 MG tablet TAKE 1 TABLET BY MOUTH EVERY DAY 30 tablet 5  . ibuprofen (ADVIL,MOTRIN) 200 MG tablet Take 800 mg by mouth every 4 (four) hours as needed.     . lidocaine-prilocaine (EMLA) cream Apply to affected area once 30 g 3  . LORazepam (ATIVAN) 0.5 MG tablet Take 1 tablet (0.5 mg total) by mouth every 6 (six) hours as needed (Nausea or vomiting). 30 tablet 0  . ondansetron (ZOFRAN) 8 MG tablet Take 1 tablet (8 mg total) by mouth 2 (two) times daily as needed for refractory nausea / vomiting. Start on day 3 after chemo. 30 tablet 1  . pantoprazole (PROTONIX) 40 MG tablet Take 1 tablet (40 mg  total) by mouth daily. 30 tablet 3  . prochlorperazine (COMPAZINE) 10 MG tablet Take 1 tablet (10 mg total) by mouth every 6 (six) hours as needed (Nausea or vomiting). 30 tablet 1  . ranitidine (ZANTAC) 150 MG tablet Take 1 tablet (150 mg total) by mouth 2 (two) times daily. x4 days (Patient not taking: Reported on 10/15/2016) 8 tablet 0   No current  facility-administered medications for this visit.     PHYSICAL EXAMINATION: ECOG PERFORMANCE STATUS: 1 - Symptomatic but completely ambulatory  Vitals:   10/30/16 1029  BP: 133/76  Pulse: 71  Resp: 18  Temp: 97.6 F (36.4 C)   Filed Weights   10/30/16 1029  Weight: 227 lb 1.6 oz (103 kg)    GENERAL:alert, no distress and comfortable SKIN: skin color, texture, turgor are normal, no rashes or significant lesions EYES: normal, Conjunctiva are pink and non-injected, sclera clear OROPHARYNX:no exudate, no erythema and lips, buccal mucosa, and tongue normal  NECK: supple, thyroid normal size, non-tender, without nodularity LYMPH:  no palpable lymphadenopathy in the cervical, axillary or inguinal LUNGS: clear to auscultation and percussion with normal breathing effort HEART: regular rate & rhythm and no murmurs and no lower extremity edema ABDOMEN:abdomen soft, non-tender and normal bowel sounds MUSCULOSKELETAL:no cyanosis of digits and no clubbing  NEURO: alert & oriented x 3 with fluent speech, no focal motor/sensory deficits EXTREMITIES: No lower extremity edema  LABORATORY DATA:  I have reviewed the data as listed   Chemistry      Component Value Date/Time   NA 141 10/30/2016 0958   K 3.6 10/30/2016 0958   CL 99 04/20/2016 1417   CO2 22 10/30/2016 0958   BUN 17.5 10/30/2016 0958   CREATININE 1.0 10/30/2016 0958      Component Value Date/Time   CALCIUM 9.6 10/30/2016 0958   ALKPHOS 69 10/30/2016 0958   AST 12 10/30/2016 0958   ALT 12 10/30/2016 0958   BILITOT 0.41 10/30/2016 0958       Lab Results  Component Value Date   WBC 13.1 (H) 10/30/2016   HGB 11.9 10/30/2016   HCT 34.9 10/30/2016   MCV 88.9 10/30/2016   PLT 369 10/30/2016   NEUTROABS 9.7 (H) 10/30/2016    ASSESSMENT & PLAN:  Malignant neoplasm of upper-outer quadrant of left female breast ([facility]) Left lumpectomy 09/06/16: IDC grade 2, 3.4 cm, Margins Neg, 0/1 LN neg, T2N0 ER 90%, PR 70%, Ki-67 30%,  HER-2 positive ratio 2.43 (stage 2 A)  Treatment Plan: 1. adjuvant chemotherapy with Herceptin (discussed TCHPfollowed by Herceptin-Perjetamaintenance to complete 1 year. 2. Adjuvant radiation therapy followed by 3. Adjuvant antiestrogen therapy ----------------------------------------------------------------------------- Current Treatment: TCHP cycle 2  Chemo Toxicities: 1. Severe cough: Could be acid reflux. On protonix. 2. nausea and vomiting: Due to chemotherapy, We reduced the dosage of Cycle 2 of chemotherapy. 3. Allergy to Neulasta: We discontinued Neulasta  4. Profound lack of taste and discomfort in the mouth 5. Generalized fatigue  Plan: Reduced the dosage with cycle 2 chemotherapy RTC with cycle 3 and we will see if she can hold up her blood counts without the Neulasta.   I spent 25 minutes talking to the patient of which more than half was spent in counseling and coordination of care.  No orders of the defined types were placed in this encounter.  The patient has a good understanding of the overall plan. she agrees with it. she will call with any problems that may develop before the next visit here.  Rulon Eisenmenger, MD 10/30/16

## 2016-10-30 NOTE — Assessment & Plan Note (Signed)
Left lumpectomy 09/06/16: IDC grade 2, 3.4 cm, Margins Neg, 0/1 LN neg, T2N0 ER 90%, PR 70%, Ki-67 30%, HER-2 positive ratio 2.43 (stage 2 A)  Treatment Plan: 1. adjuvant chemotherapy with Herceptin (discussed TCHPfollowed by Herceptin-Perjetamaintenance to complete 1 year. 2. Adjuvant radiation therapy followed by 3. Adjuvant antiestrogen therapy ----------------------------------------------------------------------------- Current Treatment: TCHP cycle 2  Chemo Toxicities: 1. Severe cough: Could be acid reflux. On protonix. 2. nausea and vomiting: Due to chemotherapy, We reduced the dosage of Cycle 2 of chemotherapy. 3. Allergy to Neulasta: We discontinued Neulasta  4. Profound lack of taste and discomfort in the mouth 5. Generalized fatigue  Plan: Reduced the dosage with cycle 2 chemotherapy RTC with cycle 3 and we will see if she can hold up her blood counts without the Neulasta.

## 2016-11-06 ENCOUNTER — Telehealth: Payer: Self-pay

## 2016-11-06 ENCOUNTER — Other Ambulatory Visit: Payer: Self-pay

## 2016-11-06 DIAGNOSIS — E860 Dehydration: Secondary | ICD-10-CM

## 2016-11-06 MED ORDER — DIPHENOXYLATE-ATROPINE 2.5-0.025 MG PO TABS: 1.0000 | ORAL_TABLET | Freq: Three times a day (TID) | ORAL | 0 refills | Status: DC | PRN

## 2016-11-06 MED ORDER — DIPHENOXYLATE-ATROPINE 2.5-0.025 MG PO TABS: 1.0000 | ORAL_TABLET | Freq: Four times a day (QID) | ORAL | 0 refills | Status: DC | PRN

## 2016-11-06 NOTE — Telephone Encounter (Signed)
Received call from pt stating that she is not feeling very well. She is s/p 1 week of reduced dose of tchp. Pt feels really fatigued, nausea, light headed and having diarrhea 4-6x per day. Pt states that she has not been eating and drinking much because she has no appetite or taste for food. Denies any fever at this time and no sob or cp. Pt states that she is taking her imodium, but has not helped. Advised pt to go to ER if symptoms worsen tonight. Discussed pt symptoms with Dr.Gudena and obtained prescription for lomotil to take. MD would like pt to come in tomorrow to get evaluated and recheck lab work and possible need for hydration infusion. Pt agreeable and would like to come in tomorrow to get checked in. Pt verbalized understanding and will be here tomorrow to see our NP.

## 2016-11-07 ENCOUNTER — Ambulatory Visit (HOSPITAL_BASED_OUTPATIENT_CLINIC_OR_DEPARTMENT_OTHER): Payer: BLUE CROSS/BLUE SHIELD | Admitting: Adult Health

## 2016-11-07 ENCOUNTER — Ambulatory Visit (HOSPITAL_BASED_OUTPATIENT_CLINIC_OR_DEPARTMENT_OTHER): Payer: BLUE CROSS/BLUE SHIELD | Admitting: Nurse Practitioner

## 2016-11-07 ENCOUNTER — Other Ambulatory Visit (HOSPITAL_BASED_OUTPATIENT_CLINIC_OR_DEPARTMENT_OTHER): Payer: BLUE CROSS/BLUE SHIELD

## 2016-11-07 ENCOUNTER — Ambulatory Visit (HOSPITAL_COMMUNITY)
Admission: RE | Admit: 2016-11-07 | Discharge: 2016-11-07 | Disposition: A | Discharge status: Home / Self Care | Payer: BLUE CROSS/BLUE SHIELD | Source: Ambulatory Visit | Attending: Adult Health | Admitting: Adult Health

## 2016-11-07 ENCOUNTER — Encounter: Payer: Self-pay | Admitting: Adult Health

## 2016-11-07 ENCOUNTER — Ambulatory Visit: Payer: BLUE CROSS/BLUE SHIELD

## 2016-11-07 ENCOUNTER — Ambulatory Visit (HOSPITAL_BASED_OUTPATIENT_CLINIC_OR_DEPARTMENT_OTHER): Payer: BLUE CROSS/BLUE SHIELD

## 2016-11-07 VITALS — BP 121/101 | HR 95 | Temp 97.9°F | Resp 20 | Wt 215.7 lb

## 2016-11-07 VITALS — BP 103/50 | HR 75 | Temp 98.6°F | Resp 18

## 2016-11-07 DIAGNOSIS — R197 Diarrhea, unspecified: Secondary | ICD-10-CM

## 2016-11-07 DIAGNOSIS — R05 Cough: Secondary | ICD-10-CM | POA: Insufficient documentation

## 2016-11-07 DIAGNOSIS — E860 Dehydration: Secondary | ICD-10-CM

## 2016-11-07 DIAGNOSIS — C50412 Malignant neoplasm of upper-outer quadrant of left female breast: Secondary | ICD-10-CM

## 2016-11-07 DIAGNOSIS — Z170 Estrogen receptor positive status [ER+]: Secondary | ICD-10-CM | POA: Insufficient documentation

## 2016-11-07 DIAGNOSIS — D709 Neutropenia, unspecified: Secondary | ICD-10-CM

## 2016-11-07 DIAGNOSIS — R112 Nausea with vomiting, unspecified: Secondary | ICD-10-CM

## 2016-11-07 DIAGNOSIS — Z95828 Presence of other vascular implants and grafts: Secondary | ICD-10-CM

## 2016-11-07 LAB — COMPREHENSIVE METABOLIC PANEL
ALBUMIN: 3.8 g/dL (ref 3.5–5.0)
ALK PHOS: 65 U/L (ref 40–150)
ALT: 20 U/L (ref 0–55)
AST: 19 U/L (ref 5–34)
Anion Gap: 8 mEq/L (ref 3–11)
BUN: 8.8 mg/dL (ref 7.0–26.0)
CHLORIDE: 102 meq/L (ref 98–109)
CO2: 28 mEq/L (ref 22–29)
Calcium: 9.8 mg/dL (ref 8.4–10.4)
Creatinine: 0.9 mg/dL (ref 0.6–1.1)
EGFR: 69 mL/min/{1.73_m2} — ABNORMAL LOW (ref >90)
GLUCOSE: 134 mg/dL (ref 70–140)
POTASSIUM: 3.3 meq/L — AB (ref 3.5–5.1)
SODIUM: 139 meq/L (ref 136–145)
Total Bilirubin: 0.46 mg/dL (ref 0.20–1.20)
Total Protein: 7.2 g/dL (ref 6.4–8.3)

## 2016-11-07 LAB — CBC WITH DIFFERENTIAL/PLATELET
BASO%: 1.9 % (ref 0.0–2.0)
BASOS ABS: 0 10*3/uL (ref 0.0–0.1)
EOS%: 0.6 % (ref 0.0–7.0)
Eosinophils Absolute: 0 10*3/uL (ref 0.0–0.5)
HCT: 35.7 % (ref 34.8–46.6)
HEMOGLOBIN: 12.4 g/dL (ref 11.6–15.9)
LYMPH#: 0.9 10*3/uL (ref 0.9–3.3)
LYMPH%: 57.7 % — ABNORMAL HIGH (ref 14.0–49.7)
MCH: 29.8 pg (ref 25.1–34.0)
MCHC: 34.7 g/dL (ref 31.5–36.0)
MCV: 85.8 fL (ref 79.5–101.0)
MONO#: 0.2 10*3/uL (ref 0.1–0.9)
MONO%: 15.4 % — AB (ref 0.0–14.0)
NEUT#: 0.4 10*3/uL — CL (ref 1.5–6.5)
NEUT%: 24.4 % — ABNORMAL LOW (ref 38.4–76.8)
NRBC: 0 % (ref 0–0)
PLATELETS: 324 10*3/uL (ref 145–400)
RBC: 4.16 10*6/uL (ref 3.70–5.45)
RDW: 13.6 % (ref 11.2–14.5)
WBC: 1.6 10*3/uL — ABNORMAL LOW (ref 3.9–10.3)

## 2016-11-07 LAB — URINALYSIS, MICROSCOPIC - CHCC
Bilirubin (Urine): NEGATIVE
Blood: NEGATIVE
GLUCOSE UR CHCC: NEGATIVE mg/dL
Ketones: NEGATIVE mg/dL
Leukocyte Esterase: NEGATIVE
Nitrite: NEGATIVE
PH: 6 (ref 4.6–8.0)
PROTEIN: NEGATIVE mg/dL
RBC / HPF: NEGATIVE (ref 0–2)
SPECIFIC GRAVITY, URINE: 1.005 (ref 1.003–1.035)
UROBILINOGEN UR: 0.2 mg/dL (ref 0.2–1)

## 2016-11-07 LAB — MAGNESIUM: Magnesium: 2.1 mg/dl (ref 1.5–2.5)

## 2016-11-07 MED ORDER — SODIUM CHLORIDE 0.9% FLUSH
10.0000 mL | Freq: Once | INTRAVENOUS | Status: AC
  Administered 2016-11-07: 10 mL via INTRAVENOUS
  Filled 2016-11-07: qty 10

## 2016-11-07 MED ORDER — AMOXICILLIN-POT CLAVULANATE 875-125 MG PO TABS: 1.0000 | ORAL_TABLET | Freq: Two times a day (BID) | ORAL | 0 refills | Status: DC

## 2016-11-07 MED ORDER — HEPARIN SOD (PORK) LOCK FLUSH 100 UNIT/ML IV SOLN
500.0000 [IU] | Freq: Once | INTRAVENOUS | Status: AC
  Administered 2016-11-07: 500 [IU] via INTRAVENOUS
  Filled 2016-11-07: qty 5

## 2016-11-07 MED ORDER — SODIUM CHLORIDE 0.9 % IV SOLN
Freq: Once | INTRAVENOUS | Status: AC
  Administered 2016-11-07: 11:00:00 via INTRAVENOUS
  Filled 2016-11-07: qty 1000

## 2016-11-07 MED ORDER — CIPROFLOXACIN HCL 500 MG PO TABS: 500.0000 mg | ORAL_TABLET | Freq: Two times a day (BID) | ORAL | 0 refills | Status: DC

## 2016-11-07 MED ORDER — PROCHLORPERAZINE EDISYLATE 5 MG/ML IJ SOLN
10.0000 mg | Freq: Once | INTRAMUSCULAR | Status: AC
  Administered 2016-11-07: 10 mg via INTRAVENOUS

## 2016-11-07 MED ORDER — SODIUM CHLORIDE 0.9 % IV SOLN
INTRAVENOUS | Status: DC
  Administered 2016-11-07: 12:00:00 via INTRAVENOUS

## 2016-11-07 MED ORDER — SODIUM CHLORIDE 0.9% FLUSH
10.0000 mL | INTRAVENOUS | Status: DC | PRN
  Administered 2016-11-07: 10 mL via INTRAVENOUS
  Filled 2016-11-07: qty 10

## 2016-11-07 MED ORDER — DEXTROSE 5 % IV SOLN
2.0000 g | Freq: Once | INTRAVENOUS | Status: AC
  Administered 2016-11-07: 2 g via INTRAVENOUS
  Filled 2016-11-07: qty 2

## 2016-11-07 MED ORDER — PROCHLORPERAZINE EDISYLATE 5 MG/ML IJ SOLN
INTRAMUSCULAR | Status: AC
  Filled 2016-11-07: qty 2

## 2016-11-07 MED FILL — AMOX TR-K CLV 875-125 MG TA: 875-125 | 7 days supply | Qty: 14 | Fill #0

## 2016-11-07 NOTE — Patient Instructions (Signed)
Neutropenia Introduction Neutropenia is a condition that occurs when you have a lower-than-normal level of a type of white blood cell (neutrophil) in your body. Neutrophils are made in the spongy center of large bones (bone marrow) and they fight infections. Neutrophils are your body's main defense against bacterial and fungal infections. The fewer neutrophils you have and the longer your body remains without them, the greater your risk of getting a severe infection. What are the causes? This condition can occur if your body uses up or destroys neutrophils faster than your bone marrow can make them. This problem may happen because of:  Bacterial or fungal infection.  Allergic disorders.  Reactions to some medicines.  Autoimmune disease.  An enlarged spleen. This condition can also occur if your bone marrow does not produce enough neutrophils. This problem may be caused by:  Cancer.  Cancer treatments, such as radiation or chemotherapy.  Viral infections.  Medicines, such as phenytoin.  Vitamin B12 deficiency.  Diseases of the bone marrow.  Environmental toxins, such as insecticides. What are the signs or symptoms? This condition does not usually cause symptoms. If symptoms are present, they are usually caused by an underlying infection. Symptoms of an infection may include:  Fever.  Chills.  Swollen glands.  Oral or anal ulcers.  Cough and shortness of breath.  Rash.  Skin infection.  Fatigue. How is this diagnosed? Your health care provider may suspect neutropenia if you have:  A condition that may cause neutropenia.  Symptoms of infection, especially fever.  Frequent and unusual infections. You will have a medical history and physical exam. Tests will also be done, such as:  A complete blood count (CBC).  A procedure to collect a sample of bone marrow for examination (bone marrow biopsy).  A chest X-ray.  A urine culture.  A blood culture. How is  this treated? Treatment depends on the underlying cause and severity of your condition. Mild neutropenia may not require treatment. Treatment may include medicines, such as:  Antibiotic medicine given through an IV tube.  Antiviral medicines.  Antifungal medicines.  A medicine to increase neutrophil production (colony-stimulating factor). You may get this drug through an IV tube or by injection.  Steroids given through an IV tube. If an underlying condition is causing neutropenia, you may need treatment for that condition. If medicines you are taking are causing neutropenia, your health care provider may have you stop taking those medicines. Follow these instructions at home: Medicines  Take over-the-counter and prescription medicines only as told by your health care provider.  Get a seasonal flu shot (influenza vaccine). Lifestyle  Do not eat unpasteurized foods.Do not eat unwashed raw fruits or vegetables.  Avoid exposure to groups of people or children.  Avoid being around people who are sick.  Avoid being around dirt or dust, such as in construction areas or gardens.  Do not provide direct care for pets. Avoid animal droppings. Do not clean litter boxes and bird cages. Hygiene   Bathe daily.  Clean the area between the genitals and the anus (perineal area) after you urinate or have a bowel movement. If you are female, wipe from front to back.  Brush your teeth with a soft toothbrush before and after meals.  Do not use a razor that has a blade. Use an electric razor to remove hair.  Wash your hands often. Make sure others who come in contact with you also wash their hands. If soap and water are not available, use hand   sanitizer. General instructions  Do not have sex unless your health care provider has approved.  Take actions to avoid cuts and burns. For example:  Be cautious when you use knives. Always cut away from yourself.  Keep knives in protective sheaths or  guards when not in use.  Use oven mitts when you cook with a hot stove, oven, or grill.  Stand a safe distance away from open fires.  Avoid people who received a vaccine in the past 30 days if that vaccine contained a live version of the germ (live vaccine). You should not get a live vaccine. Common live vaccines are varicella, measles, mumps, and rubella.  Do not share food utensils.  Do not use tampons, enemas, or rectal suppositories unless your health care provider has approved.  Keep all appointments as told by your health care provider. This is important. Contact a health care provider if:  You have a fever.  You have chills or you start to shake.  You have:  A sore throat.  A warm, red, or tender area on your skin.  A cough.  Frequent or painful urination.  Vaginal discharge or itching.  You develop:  Sores in your mouth or anus.  Swollen lymph nodes.  Red streaks on the skin.  A rash.  You feel:  Nauseous or you vomit.  Very fatigued.  Short of breath. This information is not intended to replace advice given to you by your health care provider. Make sure you discuss any questions you have with your health care provider. Document Released: 02/23/2002 Document Revised: 02/09/2016 Document Reviewed: 03/16/2015  2017 Elsevier    Dehydration, Adult Dehydration is a condition in which there is not enough fluid or water in the body. This happens when you lose more fluids than you take in. Important organs, such as the kidneys, brain, and heart, cannot function without a proper amount of fluids. Any loss of fluids from the body can lead to dehydration. Dehydration can range from mild to severe. This condition should be treated right away to prevent it from becoming severe. What are the causes? This condition may be caused by:  Vomiting.  Diarrhea.  Excessive sweating, such as from heat exposure or exercise.  Not drinking enough fluid,  especially:  When ill.  While doing activity that requires a lot of energy.  Excessive urination.  Fever.  Infection.  Certain medicines, such as medicines that cause the body to lose excess fluid (diuretics).  Inability to access safe drinking water.  Reduced physical ability to get adequate water and food. What increases the risk? This condition is more likely to develop in people:  Who have a poorly controlled long-term (chronic) illness, such as diabetes, heart disease, or kidney disease.  Who are age 27 or older.  Who are disabled.  Who live in a place with high altitude.  Who play endurance sports. What are the signs or symptoms? Symptoms of mild dehydration may include:  Thirst.  Dry lips.  Slightly dry mouth.  Dry, warm skin.  Dizziness. Symptoms of moderate dehydration may include:  Very dry mouth.  Muscle cramps.  Dark urine. Urine may be the color of tea.  Decreased urine production.  Decreased tear production.  Heartbeat that is irregular or faster than normal (palpitations).  Headache.  Light-headedness, especially when you stand up from a sitting position.  Fainting (syncope). Symptoms of severe dehydration may include:  Changes in skin, such as:  Cold and clammy skin.  Blotchy (mottled)  or pale skin.  Skin that does not quickly return to normal after being lightly pinched and released (poor skin turgor).  Changes in body fluids, such as:  Extreme thirst.  No tear production.  Inability to sweat when body temperature is high, such as in hot weather.  Very little urine production.  Changes in vital signs, such as:  Weak pulse.  Pulse that is more than 100 beats a minute when sitting still.  Rapid breathing.  Low blood pressure.  Other changes, such as:  Sunken eyes.  Cold hands and feet.  Confusion.  Lack of energy (lethargy).  Difficulty waking up from sleep.  Short-term weight  loss.  Unconsciousness. How is this diagnosed? This condition is diagnosed based on your symptoms and a physical exam. Blood and urine tests may be done to help confirm the diagnosis. How is this treated? Treatment for this condition depends on the severity. Mild or moderate dehydration can often be treated at home. Treatment should be started right away. Do not wait until dehydration becomes severe. Severe dehydration is an emergency and it needs to be treated in a hospital. Treatment for mild dehydration may include:  Drinking more fluids.  Replacing salts and minerals in your blood (electrolytes) that you may have lost. Treatment for moderate dehydration may include:  Drinking an oral rehydration solution (ORS). This is a drink that helps you replace fluids and electrolytes (rehydrate). It can be found at pharmacies and retail stores. Treatment for severe dehydration may include:  Receiving fluids through an IV tube.  Receiving an electrolyte solution through a feeding tube that is passed through your nose and into your stomach (nasogastric tube, or NG tube).  Correcting any abnormalities in electrolytes.  Treating the underlying cause of dehydration. Follow these instructions at home:  If directed by your health care provider, drink an ORS:  Make an ORS by following instructions on the package.  Start by drinking small amounts, about  cup (120 mL) every 5-10 minutes.  Slowly increase how much you drink until you have taken the amount recommended by your health care provider.  Drink enough clear fluid to keep your urine clear or pale yellow. If you were told to drink an ORS, finish the ORS first, then start slowly drinking other clear fluids. Drink fluids such as:  Water. Do not drink only water. Doing that can lead to having too little salt (sodium) in the body (hyponatremia).  Ice chips.  Fruit juice that you have added water to (diluted fruit juice).  Low-calorie  sports drinks.  Avoid:  Alcohol.  Drinks that contain a lot of sugar. These include high-calorie sports drinks, fruit juice that is not diluted, and soda.  Caffeine.  Foods that are greasy or contain a lot of fat or sugar.  Take over-the-counter and prescription medicines only as told by your health care provider.  Do not take sodium tablets. This can lead to having too much sodium in the body (hypernatremia).  Eat foods that contain a healthy balance of electrolytes, such as bananas, oranges, potatoes, tomatoes, and spinach.  Keep all follow-up visits as told by your health care provider. This is important. Contact a health care provider if:  You have abdominal pain that:  Gets worse.  Stays in one area (localizes).  You have a rash.  You have a stiff neck.  You are more irritable than usual.  You are sleepier or more difficult to wake up than usual.  You feel weak or dizzy.  You feel very thirsty.  You have urinated only a small amount of very dark urine over 6-8 hours. Get help right away if:  You have symptoms of severe dehydration.  You cannot drink fluids without vomiting.  Your symptoms get worse with treatment.  You have a fever.  You have a severe headache.  You have vomiting or diarrhea that:  Gets worse.  Does not go away.  You have blood or green matter (bile) in your vomit.  You have blood in your stool. This may cause stool to look black and tarry.  You have not urinated in 6-8 hours.  You faint.  Your heart rate while sitting still is over 100 beats a minute.  You have trouble breathing. This information is not intended to replace advice given to you by your health care provider. Make sure you discuss any questions you have with your health care provider. Document Released: 09/03/2005 Document Revised: 03/30/2016 Document Reviewed: 10/28/2015 Elsevier Interactive Patient Education  2017 [employer].

## 2016-11-07 NOTE — Progress Notes (Signed)
RN visit for IV fluids.  POt initially with shaking chills, requiring numerous blankets. Sleeping after IV compazine for nausea.  1445  Pt now febrile @ 99.1 BP has improved.  Pt c/o feeling cold though does not have chills at this time.  Has received IV Rocephin Sarita Haver, NP made aware. She will discuss with Dr. Lindi Adie. Pt up to bathroom to try and give [place] urine specimen for u/a and culture.  Urine specimen obtained.  Ok to discharge patient home per Dr. Lindi Adie. Reviewed neutropenic precautions with pt. She verbalizes understanding to go to ED if temp>100.5.  Pt to return in am for repeat labs and visit with Sarita Haver, NP.  Pt aware.

## 2016-11-07 NOTE — Progress Notes (Signed)
Patient Care Team: [name], NP as PCP - General (Internal Medicine) [name], MD as Consulting Physician (General Surgery) Nicholas Lose, MD as Consulting Physician (Hematology and Oncology) Gery Pray, MD as Consulting Physician (Radiation Oncology) Minette Headland, NP as Nurse Practitioner (Hematology and Oncology)  DIAGNOSIS:  Encounter Diagnosis  Name Primary?  . Malignant neoplasm of upper-outer quadrant of left breast in female, estrogen receptor positive (Stacy [name]) Yes    SUMMARY OF ONCOLOGIC HISTORY:   Malignant neoplasm of upper-outer quadrant of left female breast (Stacy [name])   08/21/2016 Initial Diagnosis    Screening detected left breast mass UOQ at 1:00: 1.2 x 1 x 0.5 cm, axilla negative Left breast biopsy 1:00: IDC with DCIS, grade 1, ER 90%, PR 70%, Ki-67 30%, HER-2 positive ratio 2.43, T1c N0 stage IA clinical stage      09/06/2016 Surgery    Left lumpectomy: IDC grade 2, 3.4 cm, Margins Neg, 0/1 LN neg, ER 90%, PR 70%, Ki-67 30%, HER-2 positive ratio 2.43 T2N0 (stage 2 A)      10/09/2016 -  Chemotherapy    TCH Perjeta 6 cycles followed by Herceptin Perjeta maintenance for 1 year       CHIEF COMPLIANT: Cycle 2 TCH Perjeta  INTERVAL HISTORY: Stacy [name] is a 56 year old with above-mentioned history left breast cancer treated with lumpectomy and is currently on adjuvant chemotherapy with [place]. Today is cycle 2 day 9 of her treatment.  [name] is here today after developing 5-6 episodes of diarrhea yesterday.  She has taken 3-4 Imodium without relief.  She was prescribed Lomotil by Dr. Lindi Adie yesterday, but she hasn't had the opportunity to pick the prescription up from the pharmacy.    She has had minimal PO intake, getting in 2 bottles of water yesterday, and 2 fruit bars.  She is nauseated and this started last Thursday, and vomiting started on Monday.  Today she has had 1/2 cup of coffee and a sip of water.  She is taking her Compazine 1 tab every 6  hours.  She starts this the day after chemotherapy.  She says this medicaiton helps her the best.  She also takes Ondansetron on an as needed basis, maybe 5 times per week.  She doesn't think the Ondansetron helps as much as the Compazine.  She took Lorazepam last night.    She is very cold today.  She denies any fevers, chills, shaking chills.  She says that her feet hurt, but denies any redness to the soles of her feet, or cracking of the skin.  She has watery eyes.  She says her eyes tear frequently and this started after her first cycle of chemotherapy and she's noted it worsening.  She's noted mild nasal drainage and cough the past couple of days.    Of note, after her first cycle of chemotherapy she experienced an allergic reaction to the Neulasta.  She did not receive Neulasta after the second cycle of chemotherapy.  She is neutropenic today.    REVIEW OF SYSTEMS:     I have reviewed the past medical history, past surgical history, social history and family history with the patient and they are unchanged from previous note.  ALLERGIES:  is allergic to naproxen; erythromycin; tramadol hcl; gabapentin; and neulasta [pegfilgrastim].  MEDICATIONS:  Current Outpatient Prescriptions  Medication Sig Dispense Refill  . dexamethasone (DECADRON) 4 MG tablet Take 1 tablet (4 mg total) by mouth daily. Take 1 tablet daily before chemotherapy and 1  tablet the day after with food. 30 tablet 1  . hydrochlorothiazide (HYDRODIURIL) 25 MG tablet TAKE 1 TABLET BY MOUTH EVERY DAY 30 tablet 5  . LORazepam (ATIVAN) 0.5 MG tablet Take 1 tablet (0.5 mg total) by mouth every 6 (six) hours as needed (Nausea or vomiting). 30 tablet 0  . ondansetron (ZOFRAN) 8 MG tablet Take 1 tablet (8 mg total) by mouth 2 (two) times daily as needed for refractory nausea / vomiting. Start on day 3 after chemo. 30 tablet 1  . pantoprazole (PROTONIX) 40 MG tablet Take 1 tablet (40 mg total) by mouth daily. 30 tablet 3  .  amoxicillin-clavulanate (AUGMENTIN) 875-125 MG tablet Take 1 tablet by mouth 2 (two) times daily. 14 tablet 0  . diphenoxylate-atropine (LOMOTIL) 2.5-0.025 MG tablet Take 1 tablet by mouth every 8 (eight) hours as needed for diarrhea or loose stools. (Patient not taking: Reported on 11/07/2016) 30 tablet 0  . ibuprofen (ADVIL,MOTRIN) 200 MG tablet Take 800 mg by mouth every 4 (four) hours as needed.     . lidocaine-prilocaine (EMLA) cream Apply to affected area once 30 g 3  . prochlorperazine (COMPAZINE) 10 MG tablet Take 1 tablet (10 mg total) by mouth every 6 (six) hours as needed (Nausea or vomiting). (Patient not taking: Reported on 11/07/2016) 30 tablet 1   Current Facility-Administered Medications  Medication Dose Route Frequency Provider Last Rate Last Dose  . cefTRIAXone (ROCEPHIN) 2 g in dextrose 5 % 50 mL IVPB  2 g Intravenous Once Minette Headland, NP        PHYSICAL EXAMINATION: ECOG PERFORMANCE STATUS: 1 - Symptomatic but completely ambulatory  Vitals:   11/07/16 0945  BP: (!) 121/101  Pulse: 95  Resp: 20  Temp: 97.9 F (36.6 C)   Filed Weights   11/07/16 0945  Weight: 215 lb 11.2 oz (97.8 kg)  GENERAL: Patient is a woman who appears to feel unwell, non toxic HEENT:  Sclerae anicteric.  Oropharynx clear and moist. No ulcerations or evidence of oropharyngeal candidiasis. Neck is supple.  NODES:  No cervical, supraclavicular, or axillary lymphadenopathy palpated.  BREAST EXAM:  Deferred. LUNGS:  Clear to auscultation bilaterally.  No wheezes or rhonchi. HEART:  Regular rate and rhythm. No murmur appreciated. ABDOMEN:  Soft, nontender.  Positive, normoactive bowel sounds. No organomegaly palpated. MSK:  No focal spinal tenderness to palpation.  EXTREMITIES:  No peripheral edema.   SKIN:  Clear with no obvious rashes or skin changes. No nail dyscrasia. NEURO:  Nonfocal. Well oriented.  Appropriate affect.     LABORATORY DATA:  I have reviewed the data as listed    Chemistry      Component Value Date/Time   NA 139 11/07/2016 0922   K 3.3 (L) 11/07/2016 0922   CL 99 04/20/2016 1417   CO2 28 11/07/2016 0922   BUN 8.8 11/07/2016 0922   CREATININE 0.9 11/07/2016 0922      Component Value Date/Time   CALCIUM 9.8 11/07/2016 0922   ALKPHOS 65 11/07/2016 0922   AST 19 11/07/2016 0922   ALT 20 11/07/2016 0922   BILITOT 0.46 11/07/2016 0922       Lab Results  Component Value Date   WBC 1.6 (L) 11/07/2016   HGB 12.4 11/07/2016   HCT 35.7 11/07/2016   MCV 85.8 11/07/2016   PLT 324 11/07/2016   NEUTROABS 0.4 (LL) 11/07/2016    ASSESSMENT & PLAN:   1. Left breast cancer: She is cycle 2 day 9 of treatment  with Taxotere, Carboplatin, Herceptin, Perjeta.  2. Nausea/Vomiting/Diarrhea/Dehydration: Patient to receive IV compazine today, 1 L IV fluids, with potassium (K is 3.3), to pick up Lomotil from her pharmacy.  Encouraged increased PO intake.  She will also need to be evaluated tomorrow and receive IV fluids.    3. Neutropenia:  Her ANC is 400.  I reviewed this with her and what it means.  Although she is afebrile, and doesn't have any chills, or shaking chills, I am concerned about how cold she is (although the room is slightly chilly).  We will get blood cultures, chest xray, urinalysis and urine culture today.  I will start her on 7 days of cipro 583m po bid.  I reviewed neutropenic instructs with her in detail which include to call [place] for fever, chills, shaking chills.    4. Increased tearing: Recommended she get systane eye gtts and start using them.  May need restasis or ophthalmology eval in future if it worsens.    Dispo:  --Return to cancer center tomorrow for labs, IV fluids.    Addendum: While patient was receiving IV fluids, Beth, RN in symptom management clinic noted shaking chills.  Patient had already undergone FFWU.  Patient chest xray normal and blood cultures pending.  Unable to leave urine specimen.  I reviewed this  with Dr. GLindi Adieand asked if he wanted to admit the patient.  He instructed me to order Rocephin IV now and change her PO antibiotics from Cipro to Augmentin.  I did those things and [facility].  Patient will be evaluated by me tomorrow as well in addition to labs, IV fluids.  Should she worsen in any way shape or form she needs to go to the ER.    A total of (50) minutes of face-to-face time was spent with this patient with greater than 50% of that time in counseling and care-coordination.     LCharlestine Massed NP 11/07/16

## 2016-11-08 ENCOUNTER — Encounter: Payer: Self-pay | Admitting: Adult Health

## 2016-11-08 ENCOUNTER — Ambulatory Visit (HOSPITAL_BASED_OUTPATIENT_CLINIC_OR_DEPARTMENT_OTHER): Payer: BLUE CROSS/BLUE SHIELD | Admitting: Nurse Practitioner

## 2016-11-08 ENCOUNTER — Other Ambulatory Visit (HOSPITAL_BASED_OUTPATIENT_CLINIC_OR_DEPARTMENT_OTHER): Payer: BLUE CROSS/BLUE SHIELD

## 2016-11-08 ENCOUNTER — Ambulatory Visit (HOSPITAL_BASED_OUTPATIENT_CLINIC_OR_DEPARTMENT_OTHER): Payer: BLUE CROSS/BLUE SHIELD | Admitting: Adult Health

## 2016-11-08 VITALS — BP 130/91 | HR 86 | Temp 98.2°F | Resp 18 | Ht 64.0 in | Wt 220.7 lb

## 2016-11-08 VITALS — BP 131/86 | HR 86 | Temp 98.2°F | Resp 18 | Ht 64.0 in | Wt 220.0 lb

## 2016-11-08 DIAGNOSIS — E860 Dehydration: Secondary | ICD-10-CM

## 2016-11-08 DIAGNOSIS — C50412 Malignant neoplasm of upper-outer quadrant of left female breast: Secondary | ICD-10-CM

## 2016-11-08 DIAGNOSIS — D709 Neutropenia, unspecified: Secondary | ICD-10-CM

## 2016-11-08 DIAGNOSIS — Z170 Estrogen receptor positive status [ER+]: Principal | ICD-10-CM

## 2016-11-08 DIAGNOSIS — Z95828 Presence of other vascular implants and grafts: Secondary | ICD-10-CM

## 2016-11-08 DIAGNOSIS — D702 Other drug-induced agranulocytosis: Secondary | ICD-10-CM

## 2016-11-08 LAB — CBC WITH DIFFERENTIAL/PLATELET
BASO%: 1.1 % (ref 0.0–2.0)
Basophils Absolute: 0 10*3/uL (ref 0.0–0.1)
EOS%: 0.6 % (ref 0.0–7.0)
Eosinophils Absolute: 0 10*3/uL (ref 0.0–0.5)
HCT: 33.1 % — ABNORMAL LOW (ref 34.8–46.6)
HGB: 11.3 g/dL — ABNORMAL LOW (ref 11.6–15.9)
LYMPH%: 58.9 % — AB (ref 14.0–49.7)
MCH: 29.8 pg (ref 25.1–34.0)
MCHC: 34.1 g/dL (ref 31.5–36.0)
MCV: 87.3 fL (ref 79.5–101.0)
MONO#: 0.4 10*3/uL (ref 0.1–0.9)
MONO%: 21.1 % — AB (ref 0.0–14.0)
NEUT%: 18.3 % — ABNORMAL LOW (ref 38.4–76.8)
NEUTROS ABS: 0.3 10*3/uL — AB (ref 1.5–6.5)
Platelets: 303 10*3/uL (ref 145–400)
RBC: 3.79 10*6/uL (ref 3.70–5.45)
RDW: 13.9 % (ref 11.2–14.5)
WBC: 1.8 10*3/uL — AB (ref 3.9–10.3)
lymph#: 1.1 10*3/uL (ref 0.9–3.3)

## 2016-11-08 LAB — BASIC METABOLIC PANEL
Anion Gap: 9 mEq/L (ref 3–11)
BUN: 6.7 mg/dL — ABNORMAL LOW (ref 7.0–26.0)
CHLORIDE: 104 meq/L (ref 98–109)
CO2: 27 mEq/L (ref 22–29)
Calcium: 9.3 mg/dL (ref 8.4–10.4)
Creatinine: 0.8 mg/dL (ref 0.6–1.1)
EGFR: 82 mL/min/{1.73_m2} — AB (ref >90)
Glucose: 101 mg/dl (ref 70–140)
POTASSIUM: 3.6 meq/L (ref 3.5–5.1)
SODIUM: 139 meq/L (ref 136–145)

## 2016-11-08 LAB — URINE CULTURE

## 2016-11-08 MED ORDER — SODIUM CHLORIDE 0.9 % IV SOLN
Freq: Once | INTRAVENOUS | Status: AC
  Administered 2016-11-08: 10:00:00 via INTRAVENOUS

## 2016-11-08 MED ORDER — SODIUM CHLORIDE 0.9% FLUSH
10.0000 mL | INTRAVENOUS | Status: DC | PRN
  Administered 2016-11-08 (×2): 10 mL via INTRAVENOUS
  Filled 2016-11-08: qty 10

## 2016-11-08 MED ORDER — HEPARIN SOD (PORK) LOCK FLUSH 100 UNIT/ML IV SOLN
500.0000 [IU] | Freq: Once | INTRAVENOUS | Status: AC | PRN
  Administered 2016-11-08: 500 [IU] via INTRAVENOUS
  Filled 2016-11-08: qty 5

## 2016-11-08 MED ORDER — DEXTROSE 5 % IV SOLN
2.0000 g | Freq: Once | INTRAVENOUS | Status: AC
  Administered 2016-11-08: 2 g via INTRAVENOUS
  Filled 2016-11-08: qty 2

## 2016-11-08 NOTE — Patient Instructions (Signed)
Neutropenia Introduction Neutropenia is a condition that occurs when you have a lower-than-normal level of a type of white blood cell (neutrophil) in your body. Neutrophils are made in the spongy center of large bones (bone marrow) and they fight infections. Neutrophils are your body's main defense against bacterial and fungal infections. The fewer neutrophils you have and the longer your body remains without them, the greater your risk of getting a severe infection. What are the causes? This condition can occur if your body uses up or destroys neutrophils faster than your bone marrow can make them. This problem may happen because of:  Bacterial or fungal infection.  Allergic disorders.  Reactions to some medicines.  Autoimmune disease.  An enlarged spleen. This condition can also occur if your bone marrow does not produce enough neutrophils. This problem may be caused by:  Cancer.  Cancer treatments, such as radiation or chemotherapy.  Viral infections.  Medicines, such as phenytoin.  Vitamin B12 deficiency.  Diseases of the bone marrow.  Environmental toxins, such as insecticides. What are the signs or symptoms? This condition does not usually cause symptoms. If symptoms are present, they are usually caused by an underlying infection. Symptoms of an infection may include:  Fever.  Chills.  Swollen glands.  Oral or anal ulcers.  Cough and shortness of breath.  Rash.  Skin infection.  Fatigue. How is this diagnosed? Your health care provider may suspect neutropenia if you have:  A condition that may cause neutropenia.  Symptoms of infection, especially fever.  Frequent and unusual infections. You will have a medical history and physical exam. Tests will also be done, such as:  A complete blood count (CBC).  A procedure to collect a sample of bone marrow for examination (bone marrow biopsy).  A chest X-ray.  A urine culture.  A blood culture. How is  this treated? Treatment depends on the underlying cause and severity of your condition. Mild neutropenia may not require treatment. Treatment may include medicines, such as:  Antibiotic medicine given through an IV tube.  Antiviral medicines.  Antifungal medicines.  A medicine to increase neutrophil production (colony-stimulating factor). You may get this drug through an IV tube or by injection.  Steroids given through an IV tube. If an underlying condition is causing neutropenia, you may need treatment for that condition. If medicines you are taking are causing neutropenia, your health care provider may have you stop taking those medicines. Follow these instructions at home: Medicines  Take over-the-counter and prescription medicines only as told by your health care provider.  Get a seasonal flu shot (influenza vaccine). Lifestyle  Do not eat unpasteurized foods.Do not eat unwashed raw fruits or vegetables.  Avoid exposure to groups of people or children.  Avoid being around people who are sick.  Avoid being around dirt or dust, such as in construction areas or gardens.  Do not provide direct care for pets. Avoid animal droppings. Do not clean litter boxes and bird cages. Hygiene   Bathe daily.  Clean the area between the genitals and the anus (perineal area) after you urinate or have a bowel movement. If you are female, wipe from front to back.  Brush your teeth with a soft toothbrush before and after meals.  Do not use a razor that has a blade. Use an electric razor to remove hair.  Wash your hands often. Make sure others who come in contact with you also wash their hands. If soap and water are not available, use hand   sanitizer. General instructions  Do not have sex unless your health care provider has approved.  Take actions to avoid cuts and burns. For example:  Be cautious when you use knives. Always cut away from yourself.  Keep knives in protective sheaths or  guards when not in use.  Use oven mitts when you cook with a hot stove, oven, or grill.  Stand a safe distance away from open fires.  Avoid people who received a vaccine in the past 30 days if that vaccine contained a live version of the germ (live vaccine). You should not get a live vaccine. Common live vaccines are varicella, measles, mumps, and rubella.  Do not share food utensils.  Do not use tampons, enemas, or rectal suppositories unless your health care provider has approved.  Keep all appointments as told by your health care provider. This is important. Contact a health care provider if:  You have a fever.  You have chills or you start to shake.  You have:  A sore throat.  A warm, red, or tender area on your skin.  A cough.  Frequent or painful urination.  Vaginal discharge or itching.  You develop:  Sores in your mouth or anus.  Swollen lymph nodes.  Red streaks on the skin.  A rash.  You feel:  Nauseous or you vomit.  Very fatigued.  Short of breath. This information is not intended to replace advice given to you by your health care provider. Make sure you discuss any questions you have with your health care provider. Document Released: 02/23/2002 Document Revised: 02/09/2016 Document Reviewed: 03/16/2015  2017 Elsevier

## 2016-11-08 NOTE — Progress Notes (Signed)
Patient Care Team: [name], NP as PCP - General (Internal Medicine) [name], MD as Consulting Physician (General Surgery) Nicholas Lose, MD as Consulting Physician (Hematology and Oncology) Gery Pray, MD as Consulting Physician (Radiation Oncology) Minette Headland, NP as Nurse Practitioner (Hematology and Oncology)  DIAGNOSIS:  Encounter Diagnoses  Name Primary?  . Malignant neoplasm of upper-outer quadrant of left breast in female, estrogen receptor positive ([place]) Yes  . Port catheter in place     SUMMARY OF ONCOLOGIC HISTORY:   Malignant neoplasm of upper-outer quadrant of left female breast ([place])   08/21/2016 Initial Diagnosis    Screening detected left breast mass UOQ at 1:00: 1.2 x 1 x 0.5 cm, axilla negative Left breast biopsy 1:00: IDC with DCIS, grade 1, ER 90%, PR 70%, Ki-67 30%, HER-2 positive ratio 2.43, T1c N0 stage IA clinical stage      09/06/2016 Surgery    Left lumpectomy: IDC grade 2, 3.4 cm, Margins Neg, 0/1 LN neg, ER 90%, PR 70%, Ki-67 30%, HER-2 positive ratio 2.43 T2N0 (stage 2 A)      10/09/2016 -  Chemotherapy    TCH Perjeta 6 cycles followed by Herceptin Perjeta maintenance for 1 year       CHIEF COMPLIANT: Cycle 2 TCH Perjeta  INTERVAL HISTORY: Stacy [name] is a 56 year old with above-mentioned history left breast cancer treated with lumpectomy and is currently on adjuvant chemotherapy with [place]. Today is cycle 2 day 10 of her treatment.  She was here yesterday and was very cold and dehydrated.  She had rigors over in [facility] and temp of 99.1.  She had already undergone FFWU with blood cultures x 2, chest xray that was normal, and urinalysis that was normal.  She was given rocephin iv and prescribed Augmentin and Lomotil.    [place] says she feels much better today.  She has not experienced any further diarrhea episodes or any further rigors.  She has not picked up the Augmentin or lomotil yet however.  She has picked back up with her  oral intake.    REVIEW OF SYSTEMS:   Review of Systems  Constitutional: Positive for malaise/fatigue. Negative for chills and fever.  HENT: Positive for congestion.   Eyes: Negative for blurred vision and double vision.  Respiratory: Negative for cough and shortness of breath.   Cardiovascular: Negative for chest pain, palpitations and leg swelling.  Gastrointestinal: Negative for abdominal pain, blood in stool, constipation, diarrhea, heartburn, melena, nausea and vomiting.  Genitourinary: Negative for dysuria.  Neurological: Negative for dizziness and headaches.     I have reviewed the past medical history, past surgical history, social history and family history with the patient and they are unchanged from previous note.  ALLERGIES:  is allergic to naproxen; erythromycin; tramadol hcl; gabapentin; and neulasta [pegfilgrastim].  MEDICATIONS:  Current Outpatient Prescriptions  Medication Sig Dispense Refill  . dexamethasone (DECADRON) 4 MG tablet Take 1 tablet (4 mg total) by mouth daily. Take 1 tablet daily before chemotherapy and 1 tablet the day after with food. 30 tablet 1  . hydrochlorothiazide (HYDRODIURIL) 25 MG tablet TAKE 1 TABLET BY MOUTH EVERY DAY 30 tablet 5  . ibuprofen (ADVIL,MOTRIN) 200 MG tablet Take 800 mg by mouth every 4 (four) hours as needed.     . lidocaine-prilocaine (EMLA) cream Apply to affected area once 30 g 3  . LORazepam (ATIVAN) 0.5 MG tablet Take 1 tablet (0.5 mg total) by mouth every 6 (six) hours as needed (  Nausea or vomiting). 30 tablet 0  . ondansetron (ZOFRAN) 8 MG tablet Take 1 tablet (8 mg total) by mouth 2 (two) times daily as needed for refractory nausea / vomiting. Start on day 3 after chemo. 30 tablet 1  . pantoprazole (PROTONIX) 40 MG tablet Take 1 tablet (40 mg total) by mouth daily. 30 tablet 3  . prochlorperazine (COMPAZINE) 10 MG tablet Take 1 tablet (10 mg total) by mouth every 6 (six) hours as needed (Nausea or vomiting). 30 tablet 1  .  amoxicillin-clavulanate (AUGMENTIN) 875-125 MG tablet Take 1 tablet by mouth 2 (two) times daily. (Patient not taking: Reported on 11/08/2016) 14 tablet 0  . diphenoxylate-atropine (LOMOTIL) 2.5-0.025 MG tablet Take 1 tablet by mouth every 8 (eight) hours as needed for diarrhea or loose stools. (Patient not taking: Reported on 11/07/2016) 30 tablet 0   Current Facility-Administered Medications  Medication Dose Route Frequency Provider Last Rate Last Dose  . sodium chloride flush (NS) 0.9 % injection 10 mL  10 mL Intravenous PRN Nicholas Lose, MD   10 mL at 11/08/16 1148    PHYSICAL EXAMINATION: ECOG PERFORMANCE STATUS: 1 - Symptomatic but completely ambulatory  Vitals:   11/08/16 0831  BP: (!) 130/91  Pulse: 86  Resp: 18  Temp: 98.2 F (36.8 C)   Filed Weights   11/08/16 0831  Weight: 220 lb 11.2 oz (100.1 kg)  GENERAL: Well appearing woman, no acute distress HEENT:  Sclerae anicteric.  Oropharynx clear and moist. No ulcerations or evidence of oropharyngeal candidiasis. Neck is supple.  NODES:  No cervical, supraclavicular, infraclavicular, or axillary lymphadenopathy palpated.  BREAST EXAM:  Deferred. LUNGS:  Clear to auscultation bilaterally.  No wheezes or rhonchi. HEART:  Regular rate and rhythm. No murmur appreciated. ABDOMEN:  Soft, nontender.  Positive, normoactive bowel sounds. No organomegaly palpated. MSK:  No focal spinal tenderness to palpation.  EXTREMITIES:  No peripheral edema.   SKIN:  Clear with no obvious rashes or skin changes. No nail dyscrasia. NEURO:  Nonfocal. Well oriented.  Appropriate affect.     LABORATORY DATA:  I have reviewed the data as listed   Chemistry      Component Value Date/Time   NA 139 11/08/2016 0827   K 3.6 11/08/2016 0827   CL 99 04/20/2016 1417   CO2 27 11/08/2016 0827   BUN 6.7 (L) 11/08/2016 0827   CREATININE 0.8 11/08/2016 0827      Component Value Date/Time   CALCIUM 9.3 11/08/2016 0827   ALKPHOS 65 11/07/2016 0922   AST  19 11/07/2016 0922   ALT 20 11/07/2016 0922   BILITOT 0.46 11/07/2016 0922       Lab Results  Component Value Date   WBC 1.8 (L) 11/08/2016   HGB 11.3 (L) 11/08/2016   HCT 33.1 (L) 11/08/2016   MCV 87.3 11/08/2016   PLT 303 11/08/2016   NEUTROABS 0.3 (LL) 11/08/2016    ASSESSMENT & PLAN:   1. Left breast cancer: She is cycle 2 day 10 of treatment with Taxotere, Carboplatin, Herceptin, Perjeta.  2. Dehydration: Her diarrhea has resolved.  I recommended she pick up Lomotil to keep on hand.  She will receive 554m NS today in symptom management clinic.  Her potassium is 3.6 today.    3. Neutropenia: I conveyed to patient that I am concerned as to why she hasn't yet started Augmentin.  Her anc is now 300 and she had an episode of rigors yesterday.  Due to her not starting the antibiotics,  I will give her another dose of IV rocephin today to cover her.  I recommended she pick up the antibiotics and take them as prescribed.  Neutropenic precautions were reviewed with her in detail.    Patient verbalized understanding of above instructions, and knows to call us for any questions or concerns.    Dispo:  --Return to cancer center in one week for lab only  A total of (30) minutes of face-to-face time was spent with this patient with greater than 50% of that time in counseling and care-coordination.    Charlestine Massed, NP 11/08/16

## 2016-11-08 NOTE — Progress Notes (Signed)
RN visit only for IVF.  Pt verbalizes understanding of neutropenia precautions, what to do if temp >100.5 and/shaking chills.  Encouraged to keep drinking fluids and to take oral antibiotics as directed.

## 2016-11-09 ENCOUNTER — Emergency Department (HOSPITAL_COMMUNITY)
Admission: EM | Admit: 2016-11-09 | Discharge: 2016-11-10 | Disposition: A | Discharge status: Home / Self Care | Payer: BLUE CROSS/BLUE SHIELD | Attending: Emergency Medicine | Admitting: Emergency Medicine

## 2016-11-09 ENCOUNTER — Emergency Department (HOSPITAL_COMMUNITY): Payer: BLUE CROSS/BLUE SHIELD

## 2016-11-09 ENCOUNTER — Other Ambulatory Visit: Payer: Self-pay | Admitting: Adult Health

## 2016-11-09 ENCOUNTER — Encounter (HOSPITAL_COMMUNITY): Payer: Self-pay | Admitting: Emergency Medicine

## 2016-11-09 DIAGNOSIS — R05 Cough: Secondary | ICD-10-CM | POA: Diagnosis present

## 2016-11-09 DIAGNOSIS — Z96641 Presence of right artificial hip joint: Secondary | ICD-10-CM | POA: Diagnosis not present

## 2016-11-09 DIAGNOSIS — R791 Abnormal coagulation profile: Secondary | ICD-10-CM | POA: Diagnosis not present

## 2016-11-09 DIAGNOSIS — R6883 Chills (without fever): Secondary | ICD-10-CM | POA: Diagnosis not present

## 2016-11-09 DIAGNOSIS — I10 Essential (primary) hypertension: Secondary | ICD-10-CM | POA: Insufficient documentation

## 2016-11-09 DIAGNOSIS — Z79899 Other long term (current) drug therapy: Secondary | ICD-10-CM | POA: Diagnosis not present

## 2016-11-09 DIAGNOSIS — Z853 Personal history of malignant neoplasm of breast: Secondary | ICD-10-CM | POA: Insufficient documentation

## 2016-11-09 DIAGNOSIS — F17210 Nicotine dependence, cigarettes, uncomplicated: Secondary | ICD-10-CM | POA: Diagnosis not present

## 2016-11-09 DIAGNOSIS — C50412 Malignant neoplasm of upper-outer quadrant of left female breast: Secondary | ICD-10-CM

## 2016-11-09 DIAGNOSIS — Z170 Estrogen receptor positive status [ER+]: Principal | ICD-10-CM

## 2016-11-09 DIAGNOSIS — D72829 Elevated white blood cell count, unspecified: Secondary | ICD-10-CM | POA: Diagnosis not present

## 2016-11-09 DIAGNOSIS — J3489 Other specified disorders of nose and nasal sinuses: Secondary | ICD-10-CM | POA: Insufficient documentation

## 2016-11-09 LAB — COMPREHENSIVE METABOLIC PANEL
ALBUMIN: 3.9 g/dL (ref 3.5–5.0)
ALK PHOS: 58 U/L (ref 38–126)
ALT: 18 U/L (ref 14–54)
ANION GAP: 8 (ref 5–15)
AST: 22 U/L (ref 15–41)
BILIRUBIN TOTAL: 0.5 mg/dL (ref 0.3–1.2)
BUN: 9 mg/dL (ref 6–20)
CALCIUM: 9.2 mg/dL (ref 8.9–10.3)
CO2: 27 mmol/L (ref 22–32)
Chloride: 105 mmol/L (ref 101–111)
Creatinine, Ser: 1.08 mg/dL — ABNORMAL HIGH (ref 0.44–1.00)
GFR calc non Af Amer: 58 mL/min — ABNORMAL LOW (ref >60)
Glucose, Bld: 111 mg/dL — ABNORMAL HIGH (ref 65–99)
POTASSIUM: 3.1 mmol/L — AB (ref 3.5–5.1)
SODIUM: 140 mmol/L (ref 135–145)
TOTAL PROTEIN: 7.2 g/dL (ref 6.5–8.1)

## 2016-11-09 LAB — PROTIME-INR
INR: 0.91
Prothrombin Time: 12.2 seconds (ref 11.4–15.2)

## 2016-11-09 LAB — I-STAT CG4 LACTIC ACID, ED: Lactic Acid, Venous: 0.97 mmol/L (ref 0.5–1.9)

## 2016-11-09 MED ORDER — ACETAMINOPHEN 500 MG PO TABS
1000.0000 mg | ORAL_TABLET | Freq: Once | ORAL | Status: AC
  Administered 2016-11-09: 1000 mg via ORAL
  Filled 2016-11-09: qty 2

## 2016-11-09 NOTE — ED Provider Notes (Signed)
[facility] DEPT Provider Note   CSN: [id number] Arrival date & time: 11/09/16  2247     History   Chief Complaint Chief Complaint  Patient presents with  . Chills    HPI Stacy [name] is a 57 y.o. female.  56 yo  F with a chief complaint of cough congestion and chills. This been going on for the past week. She's been seen multiple times by her oncologist for the same. Denies any fevers. Just feels cold all the time. Has been neutropenic throughout the week as well. She has been started on Augmentin without improvement. Continue to have chills today and called her oncologist who suggested that she come to the emergency department.   The history is provided by the patient.  Illness  This is a new problem. The current episode started more than 2 days ago. The problem occurs constantly. The problem has not changed since onset.Pertinent negatives include no chest pain, no headaches and no shortness of breath. Nothing aggravates the symptoms. Nothing relieves the symptoms. She has tried nothing for the symptoms. The treatment provided no relief.    Past Medical History:  Diagnosis Date  . Anxiety   . Arthritis   . Complication of anesthesia    DIFFICULTY WAKING UP , LAST SURGERY NECK 2012 WAS FINE PER PT HERE AT [facility]  . Depression   . Fibromyalgia   . Headache    HX MIGRAINES    . Heart murmur   . Hypertension   . Malignant neoplasm of upper-outer quadrant of left female breast ([place]) 08/23/2016  . MVP (mitral valve prolapse)    AS INFANT  . Neck pain   . Vision abnormalities     Patient Active Problem List   Diagnosis Date Noted  . Simple partial seizure disorder ([place]) 10/18/2016  . Port catheter in place 10/09/2016  . GERD (gastroesophageal reflux disease) 10/09/2016  . Malignant neoplasm of upper-outer quadrant of left female breast ([place]) 08/23/2016  . Episodic altered awareness 08/01/2016  . Facial spasm 08/01/2016  . Right facial numbness 08/01/2016  . Depression  04/20/2016  . Arthritis 06/27/2015  . Insomnia 10/13/2009  . Essential hypertension 04/19/2009  . Fibromyalgia 02/18/2008  . MITRAL VALVE PROLAPSE, HX OF 02/18/2008    Past Surgical History:  Procedure Laterality Date  . cervical fusiion  2012  . JOINT REPLACEMENT    . MITRAL VALVE REPAIR     MVP 1970  . PORTACATH PLACEMENT Right 09/06/2016   Procedure: INSERTION PORT-A-CATH;  Surgeon: [name], MD;  Location: [place];  Service: General;  Laterality: Right;  . RADIOACTIVE SEED GUIDED MASTECTOMY WITH AXILLARY SENTINEL LYMPH NODE BIOPSY Left 09/06/2016   Procedure: RADIOACTIVE SEED GUIDED LEFT BREAST LUMPECTOMY  WITH SENTINEL LYMPH NODE BIOPSY;  Surgeon: [name], MD;  Location: [place];  Service: General;  Laterality: Left;  . ROTATOR CUFF REPAIR Right 2012  . TOTAL HIP ARTHROPLASTY Right 09/14/2015   Procedure: RIGHT TOTAL HIP ARTHROPLASTY ANTERIOR APPROACH;  Surgeon: Leandrew Koyanagi, MD;  Location: [place];  Service: Orthopedics;  Laterality: Right;  . TUBAL LIGATION  1988    OB History    No data available       Home Medications    Prior to Admission medications   Medication Sig Start Date End Date Taking? Authorizing Provider  amoxicillin-clavulanate (AUGMENTIN) 875-125 MG tablet Take 1 tablet by mouth 2 (two) times daily. 11/07/16  Yes Minette Headland, NP  dexamethasone (DECADRON) 4  MG tablet Take 1 tablet (4 mg total) by mouth daily. Take 1 tablet daily before chemotherapy and 1 tablet the day after with food. 09/26/16  Yes Nicholas Lose, MD  hydrochlorothiazide (HYDRODIURIL) 25 MG tablet TAKE 1 TABLET BY MOUTH EVERY DAY Patient taking differently: TAKE 25 MG BY MOUTH EACH MORNING 04/27/16  Yes [name], NP  ibuprofen (ADVIL,MOTRIN) 200 MG tablet Take 400 mg by mouth every 4 (four) hours as needed for headache, mild pain or moderate pain.    Yes Historical Provider, MD  lidocaine-prilocaine (EMLA) cream Apply to affected area  once 09/26/16  Yes Nicholas Lose, MD  LORazepam (ATIVAN) 0.5 MG tablet Take 1 tablet (0.5 mg total) by mouth every 6 (six) hours as needed (Nausea or vomiting). 09/26/16  Yes Nicholas Lose, MD  ondansetron (ZOFRAN) 8 MG tablet Take 1 tablet (8 mg total) by mouth 2 (two) times daily as needed for refractory nausea / vomiting. Start on day 3 after chemo. 09/26/16  Yes Nicholas Lose, MD  pantoprazole (PROTONIX) 40 MG tablet Take 1 tablet (40 mg total) by mouth daily. Patient taking differently: Take 40 mg by mouth every morning.  10/16/16  Yes Nicholas Lose, MD  prochlorperazine (COMPAZINE) 10 MG tablet Take 1 tablet (10 mg total) by mouth every 6 (six) hours as needed (Nausea or vomiting). 09/26/16  Yes Nicholas Lose, MD  diphenoxylate-atropine (LOMOTIL) 2.5-0.025 MG tablet Take 1 tablet by mouth every 8 (eight) hours as needed for diarrhea or loose stools. Patient not taking: Reported on 11/07/2016 11/06/16   Nicholas Lose, MD    Family History Family History  Problem Relation Age of Onset  . Adopted: Yes    Social History Social History  Substance Use Topics  . Smoking status: Current Every Day Smoker    Packs/day: 0.25    Types: Cigarettes  . Smokeless tobacco: Never Used  . Alcohol use No     Allergies   Naproxen; Erythromycin; Tramadol hcl; Gabapentin; and Neulasta [pegfilgrastim]   Review of Systems Review of Systems  Constitutional: Positive for chills. Negative for fever.  HENT: Positive for congestion. Negative for rhinorrhea.   Eyes: Negative for redness and visual disturbance.  Respiratory: Positive for cough. Negative for shortness of breath and wheezing.   Cardiovascular: Negative for chest pain and palpitations.  Gastrointestinal: Negative for nausea and vomiting.  Genitourinary: Negative for dysuria and urgency.  Musculoskeletal: Negative for arthralgias and myalgias.  Skin: Negative for pallor and wound.  Neurological: Negative for dizziness and headaches.     Physical  Exam Updated Vital Signs BP 150/92   Pulse 97   Temp 98.2 F (36.8 C) (Oral)   Resp 20   Ht 5\' 4"  (1.626 m)   Wt 220 lb (99.8 kg)   LMP 11/07/2016   SpO2 97%   BMI 37.76 kg/m   Physical Exam  Constitutional: She is oriented to person, place, and time. She appears well-developed and well-nourished. No distress.  HENT:  Head: Normocephalic and atraumatic.  Rhinorrhea, posterior nasal drip.   Eyes: EOM are normal. Pupils are equal, round, and reactive to light.  Neck: Normal range of motion. Neck supple.  Cardiovascular: Normal rate and regular rhythm.  Exam reveals no gallop and no friction rub.   No murmur heard. Pulmonary/Chest: Effort normal. She has no wheezes. She has no rales.  Abdominal: Soft. She exhibits no distension and no mass. There is no tenderness. There is no guarding.  Musculoskeletal: She exhibits no edema or tenderness.  Neurological:  She is alert and oriented to person, place, and time.  Skin: Skin is warm and dry. She is not diaphoretic.  Psychiatric: She has a normal mood and affect. Her behavior is normal.  Nursing note and vitals reviewed.    ED Treatments / Results  Labs (all labs ordered are listed, but only abnormal results are displayed) Labs Reviewed  COMPREHENSIVE METABOLIC PANEL - Abnormal; Notable for the following:       Result Value   Potassium 3.1 (*)    Glucose, Bld 111 (*)    Creatinine, Ser 1.08 (*)    GFR calc non Af Amer 58 (*)    All other components within normal limits  CBC WITH DIFFERENTIAL/PLATELET - Abnormal; Notable for the following:    WBC 2.3 (*)    RBC 3.55 (*)    Hemoglobin 10.5 (*)    HCT 30.1 (*)    Neutro Abs 0.3 (*)    All other components within normal limits  URINALYSIS, ROUTINE W REFLEX MICROSCOPIC - Abnormal; Notable for the following:    Color, Urine STRAW (*)    Specific Gravity, Urine 1.003 (*)    All other components within normal limits  CULTURE, BLOOD (ROUTINE X 2)  CULTURE, BLOOD (ROUTINE X 2)    PROTIME-INR  I-STAT CG4 LACTIC ACID, ED    EKG  EKG Interpretation None       Radiology Dg Chest 2 View  Result Date: 11/09/2016 CLINICAL DATA:  Acute onset of chills and leukocytosis. Recent chemotherapy for left breast cancer. Initial encounter. EXAM: CHEST  2 VIEW COMPARISON:  Chest radiograph performed 11/07/2016 FINDINGS: The lungs are well-aerated and clear. There is no evidence of focal opacification, pleural effusion or pneumothorax. The heart is normal in size; the mediastinal contour is within normal limits. No acute osseous abnormalities are seen. A right-sided chest port is noted ending about the mid SVC. Cervical spinal fusion hardware is noted. Scattered clips are noted at the left axilla. IMPRESSION: No acute cardiopulmonary process seen. Electronically Signed   By: Garald Balding M.D.   On: 11/09/2016 23:37    Procedures Procedures (including critical care time)  Medications Ordered in ED Medications  acetaminophen (TYLENOL) tablet 1,000 mg (1,000 mg Oral Given 11/09/16 2340)     Initial Impression / Assessment and Plan / ED Course  I have reviewed the triage vital signs and the nursing notes.  Pertinent labs & imaging results that were available during my care of the patient were reviewed by me and considered in my medical decision making (see chart for details).     56 yo F with a chief complaint of chills. Patient has previously been neutropenic and has already been started on Augmentin. No fevers and afebrile here. Will check a chest x-ray and a UA.  UA negative. Chest x-ray negative for pneumonia. Patient has a normal lactate labs with continued neutropenia consistent with prior lab draws. Patient would prefer to go home. She is not toxic appearing. With no significant change of her lab values and no fever here I'll have her follow-up with her oncologist tomorrow.  12:54 AM:  I have discussed the diagnosis/risks/treatment options with the patient and believe  the pt to be eligible for discharge home to follow-up with Oncology. We also discussed returning to the ED immediately if new or worsening sx occur. We discussed the sx which are most concerning (e.g., sudden worsening pain, fever, inability to tolerate by mouth) that necessitate immediate return. Medications administered to the  patient during their visit and any new prescriptions provided to the patient are listed below.  Medications given during this visit Medications  acetaminophen (TYLENOL) tablet 1,000 mg (1,000 mg Oral Given 11/09/16 2340)     The patient appears reasonably screen and/or stabilized for discharge and I doubt any other medical condition or other [facility] requiring further screening, evaluation, or treatment in the ED at this time prior to discharge.    Final Clinical Impressions(s) / ED Diagnoses   Final diagnoses:  Chills  Rhinorrhea    New Prescriptions New Prescriptions   No medications on file     Deno Etienne, DO 11/10/16 [id number]

## 2016-11-09 NOTE — ED Notes (Signed)
Pt aware of a urine sample but is unable to provide one at this time.

## 2016-11-09 NOTE — ED Triage Notes (Signed)
Patient complaining of having chills. Patient called dr office and they told her to come her if she have chills because her white count was low yesterday at the dr office. Patient has cancer and has had her second treatment.

## 2016-11-10 LAB — CBC WITH DIFFERENTIAL/PLATELET
Basophils Absolute: 0 10*3/uL (ref 0.0–0.1)
Basophils Relative: 1 %
EOS ABS: 0 10*3/uL (ref 0.0–0.7)
Eosinophils Relative: 0 %
HEMATOCRIT: 30.1 % — AB (ref 36.0–46.0)
HEMOGLOBIN: 10.5 g/dL — AB (ref 12.0–15.0)
LYMPHS PCT: 55 %
Lymphs Abs: 1.3 10*3/uL (ref 0.7–4.0)
MCH: 29.6 pg (ref 26.0–34.0)
MCHC: 34.9 g/dL (ref 30.0–36.0)
MCV: 84.8 fL (ref 78.0–100.0)
MONO ABS: 0.7 10*3/uL (ref 0.1–1.0)
Monocytes Relative: 31 %
NEUTROS PCT: 13 %
Neutro Abs: 0.3 10*3/uL — ABNORMAL LOW (ref 1.7–7.7)
Platelets: 308 10*3/uL (ref 150–400)
RBC: 3.55 MIL/uL — AB (ref 3.87–5.11)
RDW: 14 % (ref 11.5–15.5)
WBC: 2.3 10*3/uL — AB (ref 4.0–10.5)

## 2016-11-10 LAB — URINALYSIS, ROUTINE W REFLEX MICROSCOPIC
BILIRUBIN URINE: NEGATIVE
GLUCOSE, UA: NEGATIVE mg/dL
HGB URINE DIPSTICK: NEGATIVE
KETONES UR: NEGATIVE mg/dL
Leukocytes, UA: NEGATIVE
Nitrite: NEGATIVE
PH: 5 (ref 5.0–8.0)
Protein, ur: NEGATIVE mg/dL
SPECIFIC GRAVITY, URINE: 1.003 — AB (ref 1.005–1.030)

## 2016-11-10 NOTE — Discharge Instructions (Signed)
Follow up with your oncologist.     Return for worsening symptoms.

## 2016-11-12 ENCOUNTER — Telehealth: Payer: Self-pay

## 2016-11-12 NOTE — Telephone Encounter (Signed)
Called pt to follow up on her ED visit regarding her symptoms. Pt states that she is feeling much better today. She still has mild nausea, but had been taking her nausea medications and it has helped a lot. Pt states that she is eating and drinking as much as she can. Pt coming in tomorrow for labs and nutrition counseling. Pt appreciates the follow up call.Told pt to continue to monitor for worsening of symptoms and to call with any concerns. Pt verbalized understanding.

## 2016-11-13 ENCOUNTER — Other Ambulatory Visit (HOSPITAL_BASED_OUTPATIENT_CLINIC_OR_DEPARTMENT_OTHER): Payer: BLUE CROSS/BLUE SHIELD

## 2016-11-13 ENCOUNTER — Ambulatory Visit: Payer: BLUE CROSS/BLUE SHIELD

## 2016-11-13 DIAGNOSIS — Z170 Estrogen receptor positive status [ER+]: Principal | ICD-10-CM

## 2016-11-13 DIAGNOSIS — C50412 Malignant neoplasm of upper-outer quadrant of left female breast: Secondary | ICD-10-CM

## 2016-11-13 LAB — CBC WITH DIFFERENTIAL/PLATELET
BASO%: 0.9 % (ref 0.0–2.0)
Basophils Absolute: 0 10*3/uL (ref 0.0–0.1)
EOS%: 0 % (ref 0.0–7.0)
Eosinophils Absolute: 0 10*3/uL (ref 0.0–0.5)
HCT: 34.9 % (ref 34.8–46.6)
HGB: 11.9 g/dL (ref 11.6–15.9)
LYMPH#: 1.9 10*3/uL (ref 0.9–3.3)
LYMPH%: 41.6 % (ref 14.0–49.7)
MCH: 29.9 pg (ref 25.1–34.0)
MCHC: 34.1 g/dL (ref 31.5–36.0)
MCV: 87.7 fL (ref 79.5–101.0)
MONO#: 0.8 10*3/uL (ref 0.1–0.9)
MONO%: 16.7 % — ABNORMAL HIGH (ref 0.0–14.0)
NEUT%: 40.8 % (ref 38.4–76.8)
NEUTROS ABS: 1.9 10*3/uL (ref 1.5–6.5)
PLATELETS: 222 10*3/uL (ref 145–400)
RBC: 3.98 10*6/uL (ref 3.70–5.45)
RDW: 14.5 % (ref 11.2–14.5)
WBC: 4.6 10*3/uL (ref 3.9–10.3)

## 2016-11-13 LAB — CULTURE, BLOOD (SINGLE)

## 2016-11-13 LAB — COMPREHENSIVE METABOLIC PANEL
ALT: 23 U/L (ref 0–55)
ANION GAP: 7 meq/L (ref 3–11)
AST: 19 U/L (ref 5–34)
Albumin: 3.7 g/dL (ref 3.5–5.0)
Alkaline Phosphatase: 73 U/L (ref 40–150)
BILIRUBIN TOTAL: 0.34 mg/dL (ref 0.20–1.20)
BUN: 8.4 mg/dL (ref 7.0–26.0)
CHLORIDE: 105 meq/L (ref 98–109)
CO2: 28 meq/L (ref 22–29)
CREATININE: 1.2 mg/dL — AB (ref 0.6–1.1)
Calcium: 9.6 mg/dL (ref 8.4–10.4)
EGFR: 54 mL/min/{1.73_m2} — ABNORMAL LOW (ref >90)
GLUCOSE: 90 mg/dL (ref 70–140)
Potassium: 3.8 mEq/L (ref 3.5–5.1)
Sodium: 141 mEq/L (ref 136–145)
TOTAL PROTEIN: 7.1 g/dL (ref 6.4–8.3)

## 2016-11-13 LAB — TECHNOLOGIST REVIEW: Technologist Review: 1

## 2016-11-13 NOTE — Progress Notes (Signed)
Nutrition Assessment    ASSESSMENT:  56 year old female with left breast cancer s/p lumpectomy and currently on chemotherapy.  Past medical history depression, HTN, fibromyalgia, heart murmur, GERD.  Patient reports for about 1 week following chemotherapy decreased appetite due to nausea, vomiting.  Reports that currently she is like a bear getting ready for hibernation, eating well.  Tries to eat small frequent meals, likes most foods.  Reports taste changes but has found that lemonade and lemon in water taste better.  Has switched to using plastic utensils due to metallic taste in mouth.     Nutrition Focused Physical Exam: deferred  Medications: zofran, compazine (reports takes nausea medication everyday), ativan, protonix, decadron (with chemo)  Labs: K 3.1, glucose 111, creatinine 1.08  Anthropometrics:   Height: 64 inches Weight: 220 pounds Patient reports may loose a few pounds during week of chemotherapy but then gains it back BMI: 37    NUTRITION DIAGNOSIS: Inadequate oral intake related to cancer related treatments as evidenced by nausea, vomiting during the week of chemotherapy and poor appetite   MALNUTRITION DIAGNOSIS: none at this time   INTERVENTION:   Discussed strategies to continue good nutrition during times of nausea and vomiting.  Fact sheet given.  Teach back used. Discussed strategies to improve taste changes. Fact sheet given.   Encouraged small frequent meals. Fact sheet given regarding soy foods as patient unsure if she can eat these foods.       MONITORING, EVALUATION, GOAL: Patient will consume adequate calories and protein to maintain lean muscle mass.   NEXT VISIT: as needed. Contact information provided and patient knows to call with questions or concerns  Shelden Raborn B. Zenia Resides, Willodene Stallings, [place] Registered Dietitian [phone] (pager)

## 2016-11-15 LAB — CULTURE, BLOOD (ROUTINE X 2)
CULTURE: NO GROWTH
CULTURE: NO GROWTH

## 2016-11-20 ENCOUNTER — Other Ambulatory Visit (HOSPITAL_BASED_OUTPATIENT_CLINIC_OR_DEPARTMENT_OTHER): Payer: BLUE CROSS/BLUE SHIELD

## 2016-11-20 ENCOUNTER — Ambulatory Visit (HOSPITAL_BASED_OUTPATIENT_CLINIC_OR_DEPARTMENT_OTHER): Payer: BLUE CROSS/BLUE SHIELD | Admitting: Hematology and Oncology

## 2016-11-20 ENCOUNTER — Ambulatory Visit (HOSPITAL_BASED_OUTPATIENT_CLINIC_OR_DEPARTMENT_OTHER): Payer: BLUE CROSS/BLUE SHIELD

## 2016-11-20 ENCOUNTER — Ambulatory Visit: Payer: BLUE CROSS/BLUE SHIELD

## 2016-11-20 DIAGNOSIS — C50412 Malignant neoplasm of upper-outer quadrant of left female breast: Secondary | ICD-10-CM

## 2016-11-20 DIAGNOSIS — D701 Agranulocytosis secondary to cancer chemotherapy: Secondary | ICD-10-CM | POA: Diagnosis not present

## 2016-11-20 DIAGNOSIS — Z5112 Encounter for antineoplastic immunotherapy: Secondary | ICD-10-CM

## 2016-11-20 DIAGNOSIS — Z95828 Presence of other vascular implants and grafts: Secondary | ICD-10-CM

## 2016-11-20 DIAGNOSIS — Z170 Estrogen receptor positive status [ER+]: Secondary | ICD-10-CM | POA: Diagnosis not present

## 2016-11-20 DIAGNOSIS — Z5111 Encounter for antineoplastic chemotherapy: Secondary | ICD-10-CM | POA: Diagnosis not present

## 2016-11-20 LAB — CBC WITH DIFFERENTIAL/PLATELET
BASO%: 0.5 % (ref 0.0–2.0)
BASOS ABS: 0.1 10*3/uL (ref 0.0–0.1)
EOS ABS: 0 10*3/uL (ref 0.0–0.5)
EOS%: 0.3 % (ref 0.0–7.0)
HCT: 34.9 % (ref 34.8–46.6)
HGB: 12 g/dL (ref 11.6–15.9)
LYMPH%: 17.6 % (ref 14.0–49.7)
MCH: 30.4 pg (ref 25.1–34.0)
MCHC: 34.3 g/dL (ref 31.5–36.0)
MCV: 88.4 fL (ref 79.5–101.0)
MONO#: 0.5 10*3/uL (ref 0.1–0.9)
MONO%: 5.4 % (ref 0.0–14.0)
NEUT#: 7.1 10*3/uL — ABNORMAL HIGH (ref 1.5–6.5)
NEUT%: 76.2 % (ref 38.4–76.8)
PLATELETS: 216 10*3/uL (ref 145–400)
RBC: 3.95 10*6/uL (ref 3.70–5.45)
RDW: 15.3 % — ABNORMAL HIGH (ref 11.2–14.5)
WBC: 9.3 10*3/uL (ref 3.9–10.3)
lymph#: 1.6 10*3/uL (ref 0.9–3.3)

## 2016-11-20 LAB — COMPREHENSIVE METABOLIC PANEL
ALBUMIN: 3.8 g/dL (ref 3.5–5.0)
ALK PHOS: 70 U/L (ref 40–150)
ALT: 16 U/L (ref 0–55)
AST: 15 U/L (ref 5–34)
Anion Gap: 10 mEq/L (ref 3–11)
BUN: 13 mg/dL (ref 7.0–26.0)
CO2: 23 mEq/L (ref 22–29)
Calcium: 9.4 mg/dL (ref 8.4–10.4)
Chloride: 105 mEq/L (ref 98–109)
Creatinine: 0.9 mg/dL (ref 0.6–1.1)
EGFR: 74 mL/min/{1.73_m2} — ABNORMAL LOW (ref >90)
GLUCOSE: 96 mg/dL (ref 70–140)
POTASSIUM: 4 meq/L (ref 3.5–5.1)
SODIUM: 138 meq/L (ref 136–145)
TOTAL PROTEIN: 7 g/dL (ref 6.4–8.3)
Total Bilirubin: 0.52 mg/dL (ref 0.20–1.20)

## 2016-11-20 MED ORDER — SODIUM CHLORIDE 0.9 % IV SOLN
420.0000 mg | Freq: Once | INTRAVENOUS | Status: AC
  Administered 2016-11-20: 420 mg via INTRAVENOUS
  Filled 2016-11-20: qty 14

## 2016-11-20 MED ORDER — TRASTUZUMAB CHEMO 150 MG IV SOLR
6.0000 mg/kg | Freq: Once | INTRAVENOUS | Status: AC
  Administered 2016-11-20: 630 mg via INTRAVENOUS
  Filled 2016-11-20: qty 30

## 2016-11-20 MED ORDER — SODIUM CHLORIDE 0.9% FLUSH
10.0000 mL | INTRAVENOUS | Status: DC | PRN
  Administered 2016-11-20: 10 mL via INTRAVENOUS
  Filled 2016-11-20: qty 10

## 2016-11-20 MED ORDER — SODIUM CHLORIDE 0.9 % IV SOLN
Freq: Once | INTRAVENOUS | Status: AC
  Administered 2016-11-20: 12:00:00 via INTRAVENOUS
  Filled 2016-11-20: qty 5

## 2016-11-20 MED ORDER — DOCETAXEL CHEMO INJECTION 160 MG/16ML
65.0000 mg/m2 | Freq: Once | INTRAVENOUS | Status: AC
  Administered 2016-11-20: 140 mg via INTRAVENOUS
  Filled 2016-11-20: qty 14

## 2016-11-20 MED ORDER — SODIUM CHLORIDE 0.9% FLUSH
10.0000 mL | INTRAVENOUS | Status: DC | PRN
  Administered 2016-11-20: 10 mL
  Filled 2016-11-20: qty 10

## 2016-11-20 MED ORDER — HEPARIN SOD (PORK) LOCK FLUSH 100 UNIT/ML IV SOLN
500.0000 [IU] | Freq: Once | INTRAVENOUS | Status: AC | PRN
  Administered 2016-11-20: 500 [IU]
  Filled 2016-11-20: qty 5

## 2016-11-20 MED ORDER — DIPHENHYDRAMINE HCL 25 MG PO CAPS
ORAL_CAPSULE | ORAL | Status: AC
  Filled 2016-11-20: qty 2

## 2016-11-20 MED ORDER — PALONOSETRON HCL INJECTION 0.25 MG/5ML
0.2500 mg | Freq: Once | INTRAVENOUS | Status: AC
  Administered 2016-11-20: 0.25 mg via INTRAVENOUS

## 2016-11-20 MED ORDER — ACETAMINOPHEN 325 MG PO TABS
650.0000 mg | ORAL_TABLET | Freq: Once | ORAL | Status: AC
  Administered 2016-11-20: 650 mg via ORAL

## 2016-11-20 MED ORDER — ACETAMINOPHEN 325 MG PO TABS
ORAL_TABLET | ORAL | Status: AC
Start: 2016-11-20 — End: 2016-11-20
  Filled 2016-11-20: qty 2

## 2016-11-20 MED ORDER — PALONOSETRON HCL INJECTION 0.25 MG/5ML
INTRAVENOUS | Status: AC
  Filled 2016-11-20: qty 5

## 2016-11-20 MED ORDER — CARBOPLATIN CHEMO INJECTION 600 MG/60ML
600.0000 mg | Freq: Once | INTRAVENOUS | Status: AC
  Administered 2016-11-20: 600 mg via INTRAVENOUS
  Filled 2016-11-20: qty 60

## 2016-11-20 MED ORDER — DIPHENHYDRAMINE HCL 25 MG PO CAPS
50.0000 mg | ORAL_CAPSULE | Freq: Once | ORAL | Status: AC
  Administered 2016-11-20: 50 mg via ORAL

## 2016-11-20 MED ORDER — SODIUM CHLORIDE 0.9 % IV SOLN
Freq: Once | INTRAVENOUS | Status: AC
  Administered 2016-11-20: 12:00:00 via INTRAVENOUS

## 2016-11-20 NOTE — Patient Instructions (Signed)
[facility] Discharge Instructions for Patients Receiving Chemotherapy  Today you received the following chemotherapy agents Herceptin/Perjeta/Taxotere/Carboplatin  To help prevent nausea and vomiting after your treatment, we encourage you to take your nausea medication as prescribed.  If you develop nausea and vomiting that is not controlled by your nausea medication, call the clinic.   BELOW ARE SYMPTOMS THAT SHOULD BE REPORTED IMMEDIATELY:  *FEVER GREATER THAN 100.5 F  *CHILLS WITH OR WITHOUT FEVER  NAUSEA AND VOMITING THAT IS NOT CONTROLLED WITH YOUR NAUSEA MEDICATION  *UNUSUAL SHORTNESS OF BREATH  *UNUSUAL BRUISING OR BLEEDING  TENDERNESS IN MOUTH AND THROAT WITH OR WITHOUT PRESENCE OF ULCERS  *URINARY PROBLEMS  *BOWEL PROBLEMS  UNUSUAL RASH Items with * indicate a potential emergency and should be followed up as soon as possible.  Feel free to call the clinic you have any questions or concerns. The clinic phone number is [phone].  Please show the CHEMO ALERT CARD at check-in to the Emergency Department and triage nurse.   

## 2016-11-20 NOTE — Progress Notes (Signed)
Patient Care Team: [name], NP as PCP - General (Internal Medicine) [name], MD as Consulting Physician (General Surgery) Nicholas Lose, MD as Consulting Physician (Hematology and Oncology) Gery Pray, MD as Consulting Physician (Radiation Oncology) Minette Headland, NP as Nurse Practitioner (Hematology and Oncology)  DIAGNOSIS:  Encounter Diagnosis  Name Primary?  . Malignant neoplasm of upper-outer quadrant of left breast in female, estrogen receptor positive (Stacy [name])     SUMMARY OF ONCOLOGIC HISTORY:   Malignant neoplasm of upper-outer quadrant of left female breast (Stacy [name])   08/21/2016 Initial Diagnosis    Screening detected left breast mass UOQ at 1:00: 1.2 x 1 x 0.5 cm, axilla negative Left breast biopsy 1:00: IDC with DCIS, grade 1, ER 90%, PR 70%, Ki-67 30%, HER-2 positive ratio 2.43, T1c N0 stage IA clinical stage      09/06/2016 Surgery    Left lumpectomy: IDC grade 2, 3.4 cm, Margins Neg, 0/1 LN neg, ER 90%, PR 70%, Ki-67 30%, HER-2 positive ratio 2.43 T2N0 (stage 2 A)      10/09/2016 -  Chemotherapy    TCH Perjeta 6 cycles followed by Herceptin Perjeta maintenance for 1 year       CHIEF COMPLIANT: Cycle 3 TCH Perjeta  INTERVAL HISTORY: Stacy [name] is a 56 year old with above-mentioned history left breast cancer treated with lumpectomy and is currently on adjuvant chemotherapy with [place]. She's had a tough time with chemotherapy. She had profound diarrhea that lasted 2 days and accompanied by hypotension and feeling very weak and fatigued. She had to be on fluids 2 days medical and after that she felt better. She did not have any further diarrhea. Energy levels have recovered. Taste and appetite has also improved. Nausea and vomiting was much better than cycle 1. We have reduced the dosage of cycle 2 because of nausea as well as since she had a reaction to Neulasta.  REVIEW OF SYSTEMS:   Constitutional: Denies fevers, chills or abnormal weight  loss Eyes: Denies blurriness of vision Ears, nose, mouth, throat, and face: Denies mucositis or sore throat Respiratory: Denies cough, dyspnea or wheezes Cardiovascular: Denies palpitation, chest discomfort Gastrointestinal:  Denies nausea, heartburn or change in bowel habits Skin: Denies abnormal skin rashes Lymphatics: Denies new lymphadenopathy or easy bruising Neurological:Denies numbness, tingling or new weaknesses Behavioral/Psych: Mood is stable, no new changes  Extremities: No lower extremity edema Breast:  denies any pain or lumps or nodules in either breasts All other systems were reviewed with the patient and are negative.  I have reviewed the past medical history, past surgical history, social history and family history with the patient and they are unchanged from previous note.  ALLERGIES:  is allergic to naproxen; erythromycin; tramadol hcl; gabapentin; and neulasta [pegfilgrastim].  MEDICATIONS:  Current Outpatient Prescriptions  Medication Sig Dispense Refill  . amoxicillin-clavulanate (AUGMENTIN) 875-125 MG tablet Take 1 tablet by mouth 2 (two) times daily. 14 tablet 0  . dexamethasone (DECADRON) 4 MG tablet Take 1 tablet (4 mg total) by mouth daily. Take 1 tablet daily before chemotherapy and 1 tablet the day after with food. 30 tablet 1  . diphenoxylate-atropine (LOMOTIL) 2.5-0.025 MG tablet Take 1 tablet by mouth every 8 (eight) hours as needed for diarrhea or loose stools. (Patient not taking: Reported on 11/07/2016) 30 tablet 0  . hydrochlorothiazide (HYDRODIURIL) 25 MG tablet TAKE 1 TABLET BY MOUTH EVERY DAY (Patient taking differently: TAKE 25 MG BY MOUTH EACH MORNING) 30 tablet 5  . ibuprofen (ADVIL,MOTRIN)  200 MG tablet Take 400 mg by mouth every 4 (four) hours as needed for headache, mild pain or moderate pain.     Marland Kitchen lidocaine-prilocaine (EMLA) cream Apply to affected area once 30 g 3  . LORazepam (ATIVAN) 0.5 MG tablet Take 1 tablet (0.5 mg total) by mouth every  6 (six) hours as needed (Nausea or vomiting). 30 tablet 0  . ondansetron (ZOFRAN) 8 MG tablet Take 1 tablet (8 mg total) by mouth 2 (two) times daily as needed for refractory nausea / vomiting. Start on day 3 after chemo. 30 tablet 1  . pantoprazole (PROTONIX) 40 MG tablet Take 1 tablet (40 mg total) by mouth daily. (Patient taking differently: Take 40 mg by mouth every morning. ) 30 tablet 3  . prochlorperazine (COMPAZINE) 10 MG tablet Take 1 tablet (10 mg total) by mouth every 6 (six) hours as needed (Nausea or vomiting). 30 tablet 1   No current facility-administered medications for this visit.     PHYSICAL EXAMINATION: ECOG PERFORMANCE STATUS: 1 - Symptomatic but completely ambulatory  Vitals:   11/20/16 1111  BP: (!) 157/107  Pulse: 85  Resp: 18  Temp: 98.2 F (36.8 C)   Filed Weights   11/20/16 1111  Weight: 224 lb 11.2 oz (101.9 kg)    GENERAL:alert, no distress and comfortable SKIN: skin color, texture, turgor are normal, no rashes or significant lesions EYES: normal, Conjunctiva are pink and non-injected, sclera clear OROPHARYNX:no exudate, no erythema and lips, buccal mucosa, and tongue normal  NECK: supple, thyroid normal size, non-tender, without nodularity LYMPH:  no palpable lymphadenopathy in the cervical, axillary or inguinal LUNGS: clear to auscultation and percussion with normal breathing effort HEART: regular rate & rhythm and no murmurs and no lower extremity edema ABDOMEN:abdomen soft, non-tender and normal bowel sounds MUSCULOSKELETAL:no cyanosis of digits and no clubbing  NEURO: alert & oriented x 3 with fluent speech, no focal motor/sensory deficits EXTREMITIES: No lower extremity edema   LABORATORY DATA:  I have reviewed the data as listed   Chemistry      Component Value Date/Time   NA 141 11/13/2016 1101   K 3.8 11/13/2016 1101   CL 105 11/09/2016 2307   CO2 28 11/13/2016 1101   BUN 8.4 11/13/2016 1101   CREATININE 1.2 (H) 11/13/2016 1101       Component Value Date/Time   CALCIUM 9.6 11/13/2016 1101   ALKPHOS 73 11/13/2016 1101   AST 19 11/13/2016 1101   ALT 23 11/13/2016 1101   BILITOT 0.34 11/13/2016 1101       Lab Results  Component Value Date   WBC 9.3 11/20/2016   HGB 12.0 11/20/2016   HCT 34.9 11/20/2016   MCV 88.4 11/20/2016   PLT 216 11/20/2016   NEUTROABS 7.1 (H) 11/20/2016    ASSESSMENT & PLAN:  Malignant neoplasm of upper-outer quadrant of left female breast (HCC) Left lumpectomy 09/06/16: IDC grade 2, 3.4 cm, Margins Neg, 0/1 LN neg, T2N0 ER 90%, PR 70%, Ki-67 30%, HER-2 positive ratio 2.43 (stage 2 A)  Treatment Plan: 1. adjuvant chemotherapy with Herceptin (discussed TCHPfollowed by Herceptin-Perjetamaintenance to complete 1 year. 2. Adjuvant radiation therapy followed by 3. Adjuvant antiestrogen therapy ----------------------------------------------------------------------------- Current Treatment: TCHP cycle 3  Chemo Toxicities: 1. Severe cough: Could be acid reflux. On protonix. 2. nausea and vomiting: Due to chemotherapy, We reduced the dosage of Cycle 2 of chemotherapy. 3. Allergy to Neulasta: We discontinued Neulasta  4. Profound lack of taste and discomfort in the mouth 5.  Generalized fatigue 6. Neutropenia with slight fevers after cycle 2: No evidence of infection but patient not treated with fluids, IV antibiotics 7. Diarrhea with hypokalemia: Required IV fluids and potassium replacement, now on Imodium and Lomotil  Plan: Reduced the dosage with cycle 3 chemotherapy RTC with cycle 4  I spent 25 minutes talking to the patient of which more than half was spent in counseling and coordination of care.  No orders of the defined types were placed in this encounter.  The patient has a good understanding of the overall plan. she agrees with it. she will call with any problems that may develop before the next visit here.   Rulon Eisenmenger, MD 11/20/16

## 2016-11-20 NOTE — Assessment & Plan Note (Signed)
Left lumpectomy 09/06/16: IDC grade 2, 3.4 cm, Margins Neg, 0/1 LN neg, T2N0 ER 90%, PR 70%, Ki-67 30%, HER-2 positive ratio 2.43 (stage 2 A)  Treatment Plan: 1. adjuvant chemotherapy with Herceptin (discussed TCHPfollowed by Herceptin-Perjetamaintenance to complete 1 year. 2. Adjuvant radiation therapy followed by 3. Adjuvant antiestrogen therapy ----------------------------------------------------------------------------- Current Treatment: TCHP cycle 2  Chemo Toxicities: 1. Severe cough: Could be acid reflux. On protonix. 2. nausea and vomiting: Due to chemotherapy, We reduced the dosage of Cycle 2 of chemotherapy. 3. Allergy to Neulasta: We discontinued Neulasta  4. Profound lack of taste and discomfort in the mouth 5. Generalized fatigue 6. Neutropenia with slight fevers after cycle 2: No evidence of infection but patient not treated with fluids, IV antibiotics 7. Diarrhea with hypokalemia: Required IV fluids and potassium replacement, now on Imodium and Lomotil  Plan: Reduced the dosage with cycle 3 chemotherapy RTC with cycle 4 and we will see if she can hold up her blood counts without the Neulasta. 

## 2016-11-27 ENCOUNTER — Other Ambulatory Visit: Payer: Self-pay

## 2016-11-27 ENCOUNTER — Ambulatory Visit (HOSPITAL_BASED_OUTPATIENT_CLINIC_OR_DEPARTMENT_OTHER): Payer: BLUE CROSS/BLUE SHIELD | Admitting: Nurse Practitioner

## 2016-11-27 VITALS — BP 130/76 | HR 89 | Temp 97.6°F | Resp 19 | Wt 215.9 lb

## 2016-11-27 DIAGNOSIS — E860 Dehydration: Secondary | ICD-10-CM

## 2016-11-27 DIAGNOSIS — C50412 Malignant neoplasm of upper-outer quadrant of left female breast: Secondary | ICD-10-CM

## 2016-11-27 DIAGNOSIS — Z170 Estrogen receptor positive status [ER+]: Principal | ICD-10-CM

## 2016-11-27 MED ORDER — HEPARIN SOD (PORK) LOCK FLUSH 100 UNIT/ML IV SOLN
500.0000 [IU] | Freq: Once | INTRAVENOUS | Status: AC
  Administered 2016-11-27: 500 [IU] via INTRAVENOUS
  Filled 2016-11-27: qty 5

## 2016-11-27 MED ORDER — SODIUM CHLORIDE 0.9 % IV SOLN
Freq: Once | INTRAVENOUS | Status: AC
  Administered 2016-11-27: 11:00:00 via INTRAVENOUS

## 2016-11-27 MED ORDER — ONDANSETRON HCL 40 MG/20ML IJ SOLN: 8.0000 mg | Freq: Once | INTRAMUSCULAR | Status: DC

## 2016-11-27 MED ORDER — ONDANSETRON HCL 4 MG/2ML IJ SOLN
INTRAMUSCULAR | Status: AC
  Filled 2016-11-27: qty 4

## 2016-11-27 MED ORDER — ONDANSETRON HCL 4 MG/2ML IJ SOLN
8.0000 mg | Freq: Once | INTRAMUSCULAR | Status: AC
  Administered 2016-11-27: 8 mg via INTRAVENOUS

## 2016-11-27 MED ORDER — SODIUM CHLORIDE 0.9% FLUSH
10.0000 mL | Freq: Once | INTRAVENOUS | Status: AC
  Administered 2016-11-27: 10 mL via INTRAVENOUS
  Filled 2016-11-27: qty 10

## 2016-11-27 NOTE — Progress Notes (Signed)
RN visit for IV Fluids

## 2016-11-27 NOTE — Patient Instructions (Signed)
Dehydration, Adult Dehydration is a condition in which there is not enough fluid or water in the body. This happens when you lose more fluids than you take in. Important organs, such as the kidneys, brain, and heart, cannot function without a proper amount of fluids. Any loss of fluids from the body can lead to dehydration. Dehydration can range from mild to severe. This condition should be treated right away to prevent it from becoming severe. What are the causes? This condition may be caused by:  Vomiting.  Diarrhea.  Excessive sweating, such as from heat exposure or exercise.  Not drinking enough fluid, especially:  When ill.  While doing activity that requires a lot of energy.  Excessive urination.  Fever.  Infection.  Certain medicines, such as medicines that cause the body to lose excess fluid (diuretics).  Inability to access safe drinking water.  Reduced physical ability to get adequate water and food. What increases the risk? This condition is more likely to develop in people:  Who have a poorly controlled long-term (chronic) illness, such as diabetes, heart disease, or kidney disease.  Who are age 65 or older.  Who are disabled.  Who live in a place with high altitude.  Who play endurance sports. What are the signs or symptoms? Symptoms of mild dehydration may include:   Thirst.  Dry lips.  Slightly dry mouth.  Dry, warm skin.  Dizziness. Symptoms of moderate dehydration may include:   Very dry mouth.  Muscle cramps.  Dark urine. Urine may be the color of tea.  Decreased urine production.  Decreased tear production.  Heartbeat that is irregular or faster than normal (palpitations).  Headache.  Light-headedness, especially when you stand up from a sitting position.  Fainting (syncope). Symptoms of severe dehydration may include:   Changes in skin, such as:  Cold and clammy skin.  Blotchy (mottled) or pale skin.  Skin that does  not quickly return to normal after being lightly pinched and released (poor skin turgor).  Changes in body fluids, such as:  Extreme thirst.  No tear production.  Inability to sweat when body temperature is high, such as in hot weather.  Very little urine production.  Changes in vital signs, such as:  Weak pulse.  Pulse that is more than 100 beats a minute when sitting still.  Rapid breathing.  Low blood pressure.  Other changes, such as:  Sunken eyes.  Cold hands and feet.  Confusion.  Lack of energy (lethargy).  Difficulty waking up from sleep.  Short-term weight loss.  Unconsciousness. How is this diagnosed? This condition is diagnosed based on your symptoms and a physical exam. Blood and urine tests may be done to help confirm the diagnosis. How is this treated? Treatment for this condition depends on the severity. Mild or moderate dehydration can often be treated at home. Treatment should be started right away. Do not wait until dehydration becomes severe. Severe dehydration is an emergency and it needs to be treated in a hospital. Treatment for mild dehydration may include:   Drinking more fluids.  Replacing salts and minerals in your blood (electrolytes) that you may have lost. Treatment for moderate dehydration may include:   Drinking an oral rehydration solution (ORS). This is a drink that helps you replace fluids and electrolytes (rehydrate). It can be found at pharmacies and retail stores. Treatment for severe dehydration may include:   Receiving fluids through an IV tube.  Receiving an electrolyte solution through a feeding tube that is   passed through your nose and into your stomach (nasogastric tube, or NG tube).  Correcting any abnormalities in electrolytes.  Treating the underlying cause of dehydration. Follow these instructions at home:  If directed by your health care provider, drink an ORS:  Make an ORS by following instructions on the  package.  Start by drinking small amounts, about  cup (120 mL) every 5-10 minutes.  Slowly increase how much you drink until you have taken the amount recommended by your health care provider.  Drink enough clear fluid to keep your urine clear or pale yellow. If you were told to drink an ORS, finish the ORS first, then start slowly drinking other clear fluids. Drink fluids such as:  Water. Do not drink only water. Doing that can lead to having too little salt (sodium) in the body (hyponatremia).  Ice chips.  Fruit juice that you have added water to (diluted fruit juice).  Low-calorie sports drinks.  Avoid:  Alcohol.  Drinks that contain a lot of sugar. These include high-calorie sports drinks, fruit juice that is not diluted, and soda.  Caffeine.  Foods that are greasy or contain a lot of fat or sugar.  Take over-the-counter and prescription medicines only as told by your health care provider.  Do not take sodium tablets. This can lead to having too much sodium in the body (hypernatremia).  Eat foods that contain a healthy balance of electrolytes, such as bananas, oranges, potatoes, tomatoes, and spinach.  Keep all follow-up visits as told by your health care provider. This is important. Contact a health care provider if:  You have abdominal pain that:  Gets worse.  Stays in one area (localizes).  You have a rash.  You have a stiff neck.  You are more irritable than usual.  You are sleepier or more difficult to wake up than usual.  You feel weak or dizzy.  You feel very thirsty.  You have urinated only a small amount of very dark urine over 6-8 hours. Get help right away if:  You have symptoms of severe dehydration.  You cannot drink fluids without vomiting.  Your symptoms get worse with treatment.  You have a fever.  You have a severe headache.  You have vomiting or diarrhea that:  Gets worse.  Does not go away.  You have blood or green matter  (bile) in your vomit.  You have blood in your stool. This may cause stool to look black and tarry.  You have not urinated in 6-8 hours.  You faint.  Your heart rate while sitting still is over 100 beats a minute.  You have trouble breathing. This information is not intended to replace advice given to you by your health care provider. Make sure you discuss any questions you have with your health care provider. Document Released: 09/03/2005 Document Revised: 03/30/2016 Document Reviewed: 10/28/2015 Elsevier Interactive Patient Education  2017 [employer]  

## 2016-11-27 NOTE — Addendum Note (Signed)
Addended by: Margaret Pyle on: 11/27/2016 10:55 AM   Modules accepted: Orders

## 2016-11-28 ENCOUNTER — Other Ambulatory Visit: Payer: Self-pay | Admitting: Hematology and Oncology

## 2016-11-28 DIAGNOSIS — Z170 Estrogen receptor positive status [ER+]: Principal | ICD-10-CM

## 2016-11-28 DIAGNOSIS — C50412 Malignant neoplasm of upper-outer quadrant of left female breast: Secondary | ICD-10-CM

## 2016-11-29 MED FILL — ONDANSETRON HCL 8 MG TABLET: 8 | 15 days supply | Qty: 30 | Fill #0

## 2016-11-30 ENCOUNTER — Ambulatory Visit (HOSPITAL_BASED_OUTPATIENT_CLINIC_OR_DEPARTMENT_OTHER)
Admission: RE | Admit: 2016-11-30 | Discharge: 2016-11-30 | Disposition: A | Discharge status: Home / Self Care | Payer: BLUE CROSS/BLUE SHIELD | Source: Ambulatory Visit | Attending: Cardiology | Admitting: Cardiology

## 2016-11-30 ENCOUNTER — Other Ambulatory Visit (HOSPITAL_COMMUNITY): Payer: Self-pay

## 2016-11-30 ENCOUNTER — Encounter (HOSPITAL_COMMUNITY): Payer: Self-pay

## 2016-11-30 ENCOUNTER — Ambulatory Visit (HOSPITAL_COMMUNITY)
Admission: RE | Admit: 2016-11-30 | Discharge: 2016-11-30 | Disposition: A | Discharge status: Home / Self Care | Payer: BLUE CROSS/BLUE SHIELD | Source: Ambulatory Visit | Attending: Internal Medicine | Admitting: Internal Medicine

## 2016-11-30 VITALS — BP 125/93 | HR 88 | Wt 220.8 lb

## 2016-11-30 DIAGNOSIS — Z9889 Other specified postprocedural states: Secondary | ICD-10-CM | POA: Diagnosis not present

## 2016-11-30 DIAGNOSIS — I080 Rheumatic disorders of both mitral and aortic valves: Secondary | ICD-10-CM | POA: Insufficient documentation

## 2016-11-30 DIAGNOSIS — I10 Essential (primary) hypertension: Secondary | ICD-10-CM | POA: Insufficient documentation

## 2016-11-30 DIAGNOSIS — C50919 Malignant neoplasm of unspecified site of unspecified female breast: Secondary | ICD-10-CM

## 2016-11-30 DIAGNOSIS — C50412 Malignant neoplasm of upper-outer quadrant of left female breast: Secondary | ICD-10-CM | POA: Diagnosis not present

## 2016-11-30 DIAGNOSIS — Z170 Estrogen receptor positive status [ER+]: Secondary | ICD-10-CM

## 2016-11-30 NOTE — Progress Notes (Signed)
  Echocardiogram 2D Echocardiogram has been performed.  Dirk Vanaman L Androw 11/30/2016, 9:51 AM

## 2016-11-30 NOTE — Patient Instructions (Signed)
Follow up 3 months with echocardiogram and Dr. Aundra Dubin.

## 2016-12-02 NOTE — Progress Notes (Signed)
Oncology: Dr. Lindi Adie  56 yo with history of mitral valve repair in childhood, HTN, and breast cancer presents for cardio-oncology appointment.  Patient had mitral valve repair for mitral regurgitation at age 36, ?cleft mitral valve.  Recent echoes have appeared to show a normal mitral valve.  She was diagnosed with breast cancer in 12/17.  ER+/PR+/HER2+.  She had left lumpectomy.  Chemotherapy was started in 1/18 with taxol/carboplatin/Herceptin/Perjeta.  She will get 6 cyles of this then complete 1 year of Herceptin/Perjeta.   Symptomatically doing ok. No exertional dyspnea or chest pain.    PMH: 1. Depression.  2. HTN 3. Fibromyalgia 4. S/p mitral valve repair 1971: Patient was 56 yrs old.  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  5. Breast cancer: Diagnosis in 12/17.  ER+/PR+/HER2+.  She had left lumpectomy.  Chemotherapy was started in 1/18 with taxol/carboplatin/Herceptin/Perjeta.  She will get 6 cyles of this then complete 1 year of Herceptin/Perjeta.  - Echo (12/17): EF 55-60%, moderate LVH, GLS -17%, stable mitral valve repair with no MR.  - Echo (3/18): EF 60-65%, GLS -19.2%, normal mitral valve with no MR, normal RV size and systolic function.   Social History   Social History  . Marital status: Married    Spouse name: N/A  . Number of children: N/A  . Years of education: N/A   Occupational History  . Not on file.   Social History Main Topics  . Smoking status: Current Every Day Smoker    Packs/day: 0.25    Types: Cigarettes  . Smokeless tobacco: Never Used  . Alcohol use No  . Drug use: No  . Sexual activity: Yes    Birth control/ protection: Post-menopausal   Other Topics Concern  . Not on file   Social History Narrative  . No narrative on file   Family History  Problem Relation Age of Onset  . Adopted: Yes   ROS: All systems reviewed and negative except as per HPI.   Current Outpatient Prescriptions  Medication Sig Dispense Refill  . dexamethasone  (DECADRON) 4 MG tablet Take 1 tablet (4 mg total) by mouth daily. Take 1 tablet daily before chemotherapy and 1 tablet the day after with food. 30 tablet 1  . hydrochlorothiazide (HYDRODIURIL) 25 MG tablet TAKE 1 TABLET BY MOUTH EVERY DAY 30 tablet 5  . ibuprofen (ADVIL,MOTRIN) 200 MG tablet Take 400 mg by mouth every 4 (four) hours as needed for headache, mild pain or moderate pain.     Marland Kitchen lidocaine-prilocaine (EMLA) cream Apply to affected area once 30 g 3  . pantoprazole (PROTONIX) 40 MG tablet Take 1 tablet (40 mg total) by mouth daily. 30 tablet 3  . prochlorperazine (COMPAZINE) 10 MG tablet Take 1 tablet (10 mg total) by mouth every 6 (six) hours as needed (Nausea or vomiting). 30 tablet 1  . diphenoxylate-atropine (LOMOTIL) 2.5-0.025 MG tablet Take 1 tablet by mouth every 8 (eight) hours as needed for diarrhea or loose stools. (Patient not taking: Reported on 11/07/2016) 30 tablet 0  . LORazepam (ATIVAN) 0.5 MG tablet Take 1 tablet (0.5 mg total) by mouth every 6 (six) hours as needed (Nausea or vomiting). (Patient not taking: Reported on 11/27/2016) 30 tablet 0  . ondansetron (ZOFRAN) 8 MG tablet TAKE 1 TABLET BY MOUTH TWICE DAILY AS NEEDED FOR REFRACTORY NAUSEA AND VOMITING. START ON DAY 3 AFTER CHEMO (Patient not taking: Reported on 11/30/2016) 30 tablet 1   No current facility-administered medications for this encounter.    BP Marland Kitchen)  125/93   Pulse 88   Wt 220 lb 12 oz (100.1 kg)   LMP 11/07/2016   SpO2 98%   BMI 37.89 kg/m  General: NAD Neck: No JVD, no thyromegaly or thyroid nodule.  Lungs: Clear to auscultation bilaterally with normal respiratory effort. CV: Nondisplaced PMI.  Heart regular S1/S2, no S3/S4, no murmur.  No peripheral edema.  No carotid bruit.  Normal pedal pulses.  Abdomen: Soft, nontender, no hepatosplenomegaly, no distention.  Skin: Intact without lesions or rashes.  Neurologic: Alert and oriented x 3.  Psych: Normal affect. Extremities: No clubbing or cyanosis.   HEENT: Normal.   Assessment/Plan: 1. Breast cancer: Patient is getting a Herceptin-based treatment regimen.  We discussed the risk of cardiac toxicity with Herceptin and the rationale for echo screening.  I reviewed her echo from 12/17 and the echo from today.  EF and global longitudinal strain are stable.   - Repeat echo with office visit in 3 months.  2. HTN: BP stable on HCTZ.  3. S/p mitral valve repair: This was done in childhood for mitral regurgitation.  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  Echo today showed no mitral regurgitation.  There was no obvious mitral valve abnormality.   Loralie Champagne 12/02/2016

## 2016-12-11 ENCOUNTER — Ambulatory Visit: Payer: BLUE CROSS/BLUE SHIELD

## 2016-12-11 ENCOUNTER — Other Ambulatory Visit (HOSPITAL_BASED_OUTPATIENT_CLINIC_OR_DEPARTMENT_OTHER): Payer: BLUE CROSS/BLUE SHIELD

## 2016-12-11 ENCOUNTER — Ambulatory Visit (HOSPITAL_BASED_OUTPATIENT_CLINIC_OR_DEPARTMENT_OTHER): Payer: BLUE CROSS/BLUE SHIELD | Admitting: Hematology and Oncology

## 2016-12-11 ENCOUNTER — Encounter: Payer: Self-pay | Admitting: Hematology and Oncology

## 2016-12-11 ENCOUNTER — Ambulatory Visit: Payer: BLUE CROSS/BLUE SHIELD | Admitting: Nurse Practitioner

## 2016-12-11 ENCOUNTER — Ambulatory Visit (HOSPITAL_BASED_OUTPATIENT_CLINIC_OR_DEPARTMENT_OTHER): Payer: BLUE CROSS/BLUE SHIELD

## 2016-12-11 DIAGNOSIS — Z170 Estrogen receptor positive status [ER+]: Secondary | ICD-10-CM

## 2016-12-11 DIAGNOSIS — Z95828 Presence of other vascular implants and grafts: Secondary | ICD-10-CM

## 2016-12-11 DIAGNOSIS — D701 Agranulocytosis secondary to cancer chemotherapy: Secondary | ICD-10-CM

## 2016-12-11 DIAGNOSIS — E876 Hypokalemia: Secondary | ICD-10-CM

## 2016-12-11 DIAGNOSIS — Z5112 Encounter for antineoplastic immunotherapy: Secondary | ICD-10-CM

## 2016-12-11 DIAGNOSIS — C50412 Malignant neoplasm of upper-outer quadrant of left female breast: Secondary | ICD-10-CM

## 2016-12-11 DIAGNOSIS — R197 Diarrhea, unspecified: Secondary | ICD-10-CM

## 2016-12-11 DIAGNOSIS — Z5111 Encounter for antineoplastic chemotherapy: Secondary | ICD-10-CM | POA: Diagnosis not present

## 2016-12-11 LAB — CBC WITH DIFFERENTIAL/PLATELET
BASO%: 0.6 % (ref 0.0–2.0)
Basophils Absolute: 0 10*3/uL (ref 0.0–0.1)
EOS%: 0.2 % (ref 0.0–7.0)
Eosinophils Absolute: 0 10*3/uL (ref 0.0–0.5)
HEMATOCRIT: 33.7 % — AB (ref 34.8–46.6)
HEMOGLOBIN: 11.8 g/dL (ref 11.6–15.9)
LYMPH#: 1.5 10*3/uL (ref 0.9–3.3)
LYMPH%: 20.2 % (ref 14.0–49.7)
MCH: 31.3 pg (ref 25.1–34.0)
MCHC: 35 g/dL (ref 31.5–36.0)
MCV: 89.2 fL (ref 79.5–101.0)
MONO#: 0.6 10*3/uL (ref 0.1–0.9)
MONO%: 7.4 % (ref 0.0–14.0)
NEUT#: 5.4 10*3/uL (ref 1.5–6.5)
NEUT%: 71.6 % (ref 38.4–76.8)
Platelets: 362 10*3/uL (ref 145–400)
RBC: 3.78 10*6/uL (ref 3.70–5.45)
RDW: 17.4 % — AB (ref 11.2–14.5)
WBC: 7.5 10*3/uL (ref 3.9–10.3)

## 2016-12-11 LAB — COMPREHENSIVE METABOLIC PANEL
ALBUMIN: 3.7 g/dL (ref 3.5–5.0)
ALK PHOS: 66 U/L (ref 40–150)
ALT: 14 U/L (ref 0–55)
AST: 16 U/L (ref 5–34)
Anion Gap: 10 mEq/L (ref 3–11)
BILIRUBIN TOTAL: 0.44 mg/dL (ref 0.20–1.20)
BUN: 10.5 mg/dL (ref 7.0–26.0)
CO2: 23 mEq/L (ref 22–29)
CREATININE: 0.9 mg/dL (ref 0.6–1.1)
Calcium: 9.6 mg/dL (ref 8.4–10.4)
Chloride: 105 mEq/L (ref 98–109)
EGFR: 77 mL/min/{1.73_m2} — ABNORMAL LOW (ref >90)
Glucose: 95 mg/dl (ref 70–140)
Potassium: 3.7 mEq/L (ref 3.5–5.1)
Sodium: 138 mEq/L (ref 136–145)
Total Protein: 7 g/dL (ref 6.4–8.3)

## 2016-12-11 MED ORDER — FOSAPREPITANT DIMEGLUMINE INJECTION 150 MG
Freq: Once | INTRAVENOUS | Status: AC
  Administered 2016-12-11: 11:00:00 via INTRAVENOUS
  Filled 2016-12-11: qty 5

## 2016-12-11 MED ORDER — HEPARIN SOD (PORK) LOCK FLUSH 100 UNIT/ML IV SOLN
500.0000 [IU] | Freq: Once | INTRAVENOUS | Status: AC | PRN
  Administered 2016-12-11: 500 [IU]
  Filled 2016-12-11: qty 5

## 2016-12-11 MED ORDER — DIPHENHYDRAMINE HCL 25 MG PO CAPS
50.0000 mg | ORAL_CAPSULE | Freq: Once | ORAL | Status: AC
  Administered 2016-12-11: 50 mg via ORAL

## 2016-12-11 MED ORDER — DIPHENHYDRAMINE HCL 25 MG PO CAPS
ORAL_CAPSULE | ORAL | Status: AC
  Filled 2016-12-11: qty 2

## 2016-12-11 MED ORDER — SODIUM CHLORIDE 0.9% FLUSH
10.0000 mL | INTRAVENOUS | Status: DC | PRN
  Administered 2016-12-11: 10 mL
  Filled 2016-12-11: qty 10

## 2016-12-11 MED ORDER — SODIUM CHLORIDE 0.9 % IV SOLN
600.0000 mg | Freq: Once | INTRAVENOUS | Status: AC
  Administered 2016-12-11: 600 mg via INTRAVENOUS
  Filled 2016-12-11: qty 60

## 2016-12-11 MED ORDER — SODIUM CHLORIDE 0.9 % IV SOLN
420.0000 mg | Freq: Once | INTRAVENOUS | Status: AC
  Administered 2016-12-11: 420 mg via INTRAVENOUS
  Filled 2016-12-11: qty 14

## 2016-12-11 MED ORDER — PALONOSETRON HCL INJECTION 0.25 MG/5ML
0.2500 mg | Freq: Once | INTRAVENOUS | Status: AC
  Administered 2016-12-11: 0.25 mg via INTRAVENOUS

## 2016-12-11 MED ORDER — PALONOSETRON HCL INJECTION 0.25 MG/5ML
INTRAVENOUS | Status: AC
  Filled 2016-12-11: qty 5

## 2016-12-11 MED ORDER — TRASTUZUMAB CHEMO 150 MG IV SOLR
6.0000 mg/kg | Freq: Once | INTRAVENOUS | Status: AC
  Administered 2016-12-11: 630 mg via INTRAVENOUS
  Filled 2016-12-11: qty 30

## 2016-12-11 MED ORDER — DEXTROSE 5 % IV SOLN
50.0000 mg/m2 | Freq: Once | INTRAVENOUS | Status: AC
  Administered 2016-12-11: 110 mg via INTRAVENOUS
  Filled 2016-12-11: qty 11

## 2016-12-11 MED ORDER — ACETAMINOPHEN 325 MG PO TABS
ORAL_TABLET | ORAL | Status: AC
Start: 2016-12-11 — End: 2016-12-11
  Filled 2016-12-11: qty 2

## 2016-12-11 MED ORDER — SODIUM CHLORIDE 0.9% FLUSH
10.0000 mL | INTRAVENOUS | Status: DC | PRN
  Administered 2016-12-11: 10 mL via INTRAVENOUS
  Filled 2016-12-11: qty 10

## 2016-12-11 MED ORDER — ACETAMINOPHEN 325 MG PO TABS
650.0000 mg | ORAL_TABLET | Freq: Once | ORAL | Status: AC
Start: 2016-12-11 — End: 2016-12-11
  Administered 2016-12-11: 650 mg via ORAL

## 2016-12-11 MED ORDER — SODIUM CHLORIDE 0.9 % IV SOLN
Freq: Once | INTRAVENOUS | Status: AC
  Administered 2016-12-11: 11:00:00 via INTRAVENOUS

## 2016-12-11 NOTE — Assessment & Plan Note (Signed)
Left lumpectomy 09/06/16: IDC grade 2, 3.4 cm, Margins Neg, 0/1 LN neg, T2N0 ER 90%, PR 70%, Ki-67 30%, HER-2 positive ratio 2.43 (stage 2 A)  Treatment Plan: 1. adjuvant chemotherapy with Herceptin (discussed TCHPfollowed by Herceptin-Perjetamaintenance to complete 1 year. 2. Adjuvant radiation therapy followed by 3. Adjuvant antiestrogen therapy ----------------------------------------------------------------------------- Current Treatment: TCHP cycle 4 Chemo Toxicities: 1. Severe cough: Could beacid reflux. Onprotonix. 2. nausea and vomiting: Due to chemotherapy, Wereducedthe dosage of Cycle 2 of chemotherapy. 3. Allergy to Neulasta: We discontinued Neulasta  4. Profound lack of taste and discomfort in the mouth 5. Generalized fatigue 6. Neutropenia with slight fevers after cycle 2: No evidence of infection but patient not treated with fluids, IV antibiotics 7. Diarrhea with hypokalemia: Required IV fluids and potassium replacement, now on Imodium and Lomotil  Plan: Reducedthe dosage with cycle 3 chemotherapy RTC with cycle 5

## 2016-12-11 NOTE — Patient Instructions (Signed)
Implanted Port Home Guide An implanted port is a type of central line that is placed under the skin. Central lines are used to provide IV access when treatment or nutrition needs to be given through a person's veins. Implanted ports are used for long-term IV access. An implanted port may be placed because:  You need IV medicine that would be irritating to the small veins in your hands or arms.  You need long-term IV medicines, such as antibiotics.  You need IV nutrition for a long period.  You need frequent blood draws for lab tests.  You need dialysis.  Implanted ports are usually placed in the chest area, but they can also be placed in the upper arm, the abdomen, or the leg. An implanted port has two main parts:  Reservoir. The reservoir is round and will appear as a small, raised area under your skin. The reservoir is the part where a needle is inserted to give medicines or draw blood.  Catheter. The catheter is a thin, flexible tube that extends from the reservoir. The catheter is placed into a large vein. Medicine that is inserted into the reservoir goes into the catheter and then into the vein.  How will I care for my incision site? Do not get the incision site wet. Bathe or shower as directed by your health care provider. How is my port accessed? Special steps must be taken to access the port:  Before the port is accessed, a numbing cream can be placed on the skin. This helps numb the skin over the port site.  Your health care provider uses a sterile technique to access the port. ? Your health care provider must put on a mask and sterile gloves. ? The skin over your port is cleaned carefully with an antiseptic and allowed to dry. ? The port is gently pinched between sterile gloves, and a needle is inserted into the port.  Only "non-coring" port needles should be used to access the port. Once the port is accessed, a blood return should be checked. This helps ensure that the port  is in the vein and is not clogged.  If your port needs to remain accessed for a constant infusion, a clear (transparent) bandage will be placed over the needle site. The bandage and needle will need to be changed every week, or as directed by your health care provider.  Keep the bandage covering the needle clean and dry. Do not get it wet. Follow your health care provider's instructions on how to take a shower or bath while the port is accessed.  If your port does not need to stay accessed, no bandage is needed over the port.  What is flushing? Flushing helps keep the port from getting clogged. Follow your health care provider's instructions on how and when to flush the port. Ports are usually flushed with saline solution or a medicine called heparin. The need for flushing will depend on how the port is used.  If the port is used for intermittent medicines or blood draws, the port will need to be flushed: ? After medicines have been given. ? After blood has been drawn. ? As part of routine maintenance.  If a constant infusion is running, the port may not need to be flushed.  How long will my port stay implanted? The port can stay in for as long as your health care provider thinks it is needed. When it is time for the port to come out, surgery will be   done to remove it. The procedure is similar to the one performed when the port was put in. When should I seek immediate medical care? When you have an implanted port, you should seek immediate medical care if:  You notice a bad smell coming from the incision site.  You have swelling, redness, or drainage at the incision site.  You have more swelling or pain at the port site or the surrounding area.  You have a fever that is not controlled with medicine.  This information is not intended to replace advice given to you by your health care provider. Make sure you discuss any questions you have with your health care provider. Document  Released: 09/03/2005 Document Revised: 02/09/2016 Document Reviewed: 05/11/2013 Elsevier Interactive Patient Education  2017 [employer]vier Inc.  

## 2016-12-11 NOTE — Patient Instructions (Signed)
[facility] Discharge Instructions for Patients Receiving Chemotherapy  Today you received the following chemotherapy agents Herceptin/Perjeta/Taxotere/Carboplatin  To help prevent nausea and vomiting after your treatment, we encourage you to take your nausea medication as prescribed.  If you develop nausea and vomiting that is not controlled by your nausea medication, call the clinic.   BELOW ARE SYMPTOMS THAT SHOULD BE REPORTED IMMEDIATELY:  *FEVER GREATER THAN 100.5 F  *CHILLS WITH OR WITHOUT FEVER  NAUSEA AND VOMITING THAT IS NOT CONTROLLED WITH YOUR NAUSEA MEDICATION  *UNUSUAL SHORTNESS OF BREATH  *UNUSUAL BRUISING OR BLEEDING  TENDERNESS IN MOUTH AND THROAT WITH OR WITHOUT PRESENCE OF ULCERS  *URINARY PROBLEMS  *BOWEL PROBLEMS  UNUSUAL RASH Items with * indicate a potential emergency and should be followed up as soon as possible.  Feel free to call the clinic you have any questions or concerns. The clinic phone number is [phone].  Please show the CHEMO ALERT CARD at check-in to the Emergency Department and triage nurse.   

## 2016-12-11 NOTE — Progress Notes (Signed)
Patient Care Team: [name], NP as PCP - General (Internal Medicine) [name], MD as Consulting Physician (General Surgery) Nicholas Lose, MD as Consulting Physician (Hematology and Oncology) Gery Pray, MD as Consulting Physician (Radiation Oncology) Gardenia Phlegm, NP as Nurse Practitioner (Hematology and Oncology)  DIAGNOSIS:  Encounter Diagnosis  Name Primary?  . Malignant neoplasm of upper-outer quadrant of left breast in female, estrogen receptor positive ([place])     SUMMARY OF ONCOLOGIC HISTORY:   Malignant neoplasm of upper-outer quadrant of left female breast ([place])   08/21/2016 Initial Diagnosis    Screening detected left breast mass UOQ at 1:00: 1.2 x 1 x 0.5 cm, axilla negative Left breast biopsy 1:00: IDC with DCIS, grade 1, ER 90%, PR 70%, Ki-67 30%, HER-2 positive ratio 2.43, T1c N0 stage IA clinical stage      09/06/2016 Surgery    Left lumpectomy: IDC grade 2, 3.4 cm, Margins Neg, 0/1 LN neg, ER 90%, PR 70%, Ki-67 30%, HER-2 positive ratio 2.43 T2N0 (stage 2 A)      10/09/2016 -  Chemotherapy    TCH [name] 6 cycles followed by Herceptin [name] maintenance for 1 year       CHIEF COMPLIANT: Stacy [name] cycle 4  INTERVAL HISTORY: Stacy [name] is a 56 year old with above-mentioned history left breast cancer currently on neoadjuvant chemotherapy with TCH [name]. Today's cycle 4 of treatment. She is complaining that the legs and arms are hurting. She does not describe classic neuropathy symptoms like tingling or numbness but she describes it as a pain in the bone. She does complain of nausea that lasts 3-4 days usually has no lasting more than 7-10 days. Diarrhea is under good control.  REVIEW OF SYSTEMS:   Constitutional: Denies fevers, chills or abnormal weight loss Eyes: Denies blurriness of vision Ears, nose, mouth, throat, and face: Denies mucositis or sore throat Respiratory: Denies cough, dyspnea or wheezes Cardiovascular: Denies  palpitation, chest discomfort Gastrointestinal:  Diarrhea Skin: Denies abnormal skin rashes Lymphatics: Denies new lymphadenopathy or easy bruising Neurological:Denies numbness, tingling or new weaknesses Behavioral/Psych: Mood is stable, no new changes  Extremities: No lower extremity edema  All other systems were reviewed with the patient and are negative.  I have reviewed the past medical history, past surgical history, social history and family history with the patient and they are unchanged from previous note.  ALLERGIES:  is allergic to naproxen; erythromycin; tramadol hcl; gabapentin; and neulasta [pegfilgrastim].  MEDICATIONS:  Current Outpatient Prescriptions  Medication Sig Dispense Refill  . dexamethasone (DECADRON) 4 MG tablet Take 1 tablet (4 mg total) by mouth daily. Take 1 tablet daily before chemotherapy and 1 tablet the day after with food. 30 tablet 1  . diphenoxylate-atropine (LOMOTIL) 2.5-0.025 MG tablet Take 1 tablet by mouth every 8 (eight) hours as needed for diarrhea or loose stools. (Patient not taking: Reported on 11/07/2016) 30 tablet 0  . hydrochlorothiazide (HYDRODIURIL) 25 MG tablet TAKE 1 TABLET BY MOUTH EVERY DAY 30 tablet 5  . ibuprofen (ADVIL,MOTRIN) 200 MG tablet Take 400 mg by mouth every 4 (four) hours as needed for headache, mild pain or moderate pain.     Marland Kitchen lidocaine-prilocaine (EMLA) cream Apply to affected area once 30 g 3  . LORazepam (ATIVAN) 0.5 MG tablet Take 1 tablet (0.5 mg total) by mouth every 6 (six) hours as needed (Nausea or vomiting). (Patient not taking: Reported on 11/27/2016) 30 tablet 0  . ondansetron (ZOFRAN) 8 MG tablet TAKE 1 TABLET BY  MOUTH TWICE DAILY AS NEEDED FOR REFRACTORY NAUSEA AND VOMITING. START ON DAY 3 AFTER CHEMO (Patient not taking: Reported on 11/30/2016) 30 tablet 1  . pantoprazole (PROTONIX) 40 MG tablet Take 1 tablet (40 mg total) by mouth daily. 30 tablet 3  . prochlorperazine (COMPAZINE) 10 MG tablet Take 1 tablet  (10 mg total) by mouth every 6 (six) hours as needed (Nausea or vomiting). 30 tablet 1   No current facility-administered medications for this visit.     PHYSICAL EXAMINATION: ECOG PERFORMANCE STATUS: 1 - Symptomatic but completely ambulatory  Vitals:   12/11/16 0946  BP: (!) 159/81  Pulse: 94  Resp: 19  Temp: 97.8 F (36.6 C)   Filed Weights   12/11/16 0946  Weight: 222 lb 9.6 oz (101 kg)    GENERAL:alert, no distress and comfortable, Alopecia SKIN: skin color, texture, turgor are normal, no rashes or significant lesions EYES: normal, Conjunctiva are pink and non-injected, sclera clear OROPHARYNX:no exudate, no erythema and lips, buccal mucosa, and tongue normal  NECK: supple, thyroid normal size, non-tender, without nodularity LYMPH:  no palpable lymphadenopathy in the cervical, axillary or inguinal LUNGS: clear to auscultation and percussion with normal breathing effort HEART: regular rate & rhythm and no murmurs and no lower extremity edema ABDOMEN:abdomen soft, non-tender and normal bowel sounds MUSCULOSKELETAL:no cyanosis of digits and no clubbing  NEURO: alert & oriented x 3 with fluent speech, no focal motor/sensory deficits EXTREMITIES: No lower extremity edema  LABORATORY DATA:  I have reviewed the data as listed   Chemistry      Component Value Date/Time   NA 138 11/20/2016 1045   K 4.0 11/20/2016 1045   CL 105 11/09/2016 2307   CO2 23 11/20/2016 1045   BUN 13.0 11/20/2016 1045   CREATININE 0.9 11/20/2016 1045      Component Value Date/Time   CALCIUM 9.4 11/20/2016 1045   ALKPHOS 70 11/20/2016 1045   AST 15 11/20/2016 1045   ALT 16 11/20/2016 1045   BILITOT 0.52 11/20/2016 1045       Lab Results  Component Value Date   WBC 7.5 12/11/2016   HGB 11.8 12/11/2016   HCT 33.7 (L) 12/11/2016   MCV 89.2 12/11/2016   PLT 362 12/11/2016   NEUTROABS 5.4 12/11/2016    ASSESSMENT & PLAN:  Malignant neoplasm of upper-outer quadrant of left female breast  (HCC) Left lumpectomy 09/06/16: IDC grade 2, 3.4 cm, Margins Neg, 0/1 LN neg, T2N0 ER 90%, PR 70%, Ki-67 30%, HER-2 positive ratio 2.43 (stage 2 A)  Treatment Plan: 1. adjuvant chemotherapy with Herceptin (discussed TCHPfollowed by Herceptin-Perjetamaintenance to complete 1 year. 2. Adjuvant radiation therapy followed by 3. Adjuvant antiestrogen therapy ----------------------------------------------------------------------------- Current Treatment: TCHP cycle 4 Chemo Toxicities: 1. Severe cough: Could beacid reflux. Onprotonix. 2. nausea and vomiting: Due to chemotherapy, Wereducedthe dosage of Cycle 2 of chemotherapy and further reduced on cycle 4. 3. Allergy to Neulasta: We discontinued Neulasta  4. Profound lack of taste and discomfort in the mouth 5. Generalized fatigue 6. Neutropenia with slight fevers after cycle 2: No evidence of infection but patient not treated with fluids, IV antibiotics 7. Diarrhea with hypokalemia: Required IV fluids and potassium replacement, now on Imodium and Lomotil  Plan: Reducedthe dosage with cycle 3 and cycle 4 chemotherapy RTC with cycle 5  I spent 25 minutes talking to the patient of which more than half was spent in counseling and coordination of care.  No orders of the defined types were placed in this  encounter.  The patient has a good understanding of the overall plan. she agrees with it. she will call with any problems that may develop before the next visit here.   Rulon Eisenmenger, MD 12/11/16

## 2016-12-12 ENCOUNTER — Ambulatory Visit: Payer: BLUE CROSS/BLUE SHIELD | Admitting: Nurse Practitioner

## 2016-12-12 ENCOUNTER — Other Ambulatory Visit: Payer: BLUE CROSS/BLUE SHIELD

## 2016-12-12 ENCOUNTER — Ambulatory Visit (HOSPITAL_BASED_OUTPATIENT_CLINIC_OR_DEPARTMENT_OTHER): Payer: BLUE CROSS/BLUE SHIELD | Admitting: Genetics

## 2016-12-12 ENCOUNTER — Encounter: Payer: Self-pay | Admitting: Genetics

## 2016-12-12 DIAGNOSIS — C50412 Malignant neoplasm of upper-outer quadrant of left female breast: Secondary | ICD-10-CM

## 2016-12-12 DIAGNOSIS — Z7183 Encounter for nonprocreative genetic counseling: Secondary | ICD-10-CM

## 2016-12-12 DIAGNOSIS — Z170 Estrogen receptor positive status [ER+]: Principal | ICD-10-CM

## 2016-12-12 DIAGNOSIS — Z803 Family history of malignant neoplasm of breast: Secondary | ICD-10-CM

## 2016-12-12 NOTE — Progress Notes (Signed)
REFERRING PROVIDER: [name], NP [place], [place] [address]  Dr. Lindi [name]  PRIMARY PROVIDER:  Webb Silversmith, NP  PRIMARY REASON FOR VISIT:  1. Malignant neoplasm of upper-outer quadrant of left breast in female, estrogen receptor positive ([place])   2. Family history of breast cancer in female     HISTORY OF PRESENT ILLNESS:   [name], a 56 y.o. female, was seen for a [place] cancer genetics consultation at the request of Dr. Lindi [name] due to a personal and family history of cancer.  Stacy [name] presents to clinic today to discuss the possibility of a hereditary predisposition to cancer, genetic testing, and to further clarify her future cancer risks, as well as potential cancer risks for family members.   In December 2017, at the age of 24, Stacy [name] was diagnosed with triple positive invasive ductal carcinoma of her left breast. This was treated with lumpectomy. She is currently undergoing chemotherapy treatments.   CANCER HISTORY:    Malignant neoplasm of upper-outer quadrant of left female breast ([place])   08/21/2016 Initial Diagnosis    Screening detected left breast mass UOQ at 1:00: 1.2 x 1 x 0.5 cm, axilla negative Left breast biopsy 1:00: IDC with DCIS, grade 1, ER 90%, PR 70%, Ki-67 30%, HER-2 positive ratio 2.43, T1c N0 stage IA clinical stage      09/06/2016 Surgery    Left lumpectomy: IDC grade 2, 3.4 cm, Margins Neg, 0/1 LN neg, ER 90%, PR 70%, Ki-67 30%, HER-2 positive ratio 2.43 T2N0 (stage 2 A)      10/09/2016 -  Chemotherapy    TCH Perjeta 6 cycles followed by Herceptin Perjeta maintenance for 1 year        HORMONAL RISK FACTORS:  Menarche was at age 61.  First live birth at age 68.  OCP use for approximately 0 years.  Ovaries intact: yes.  Hysterectomy: no.  Menopausal status: perimenopausal.  HRT use: 0 years. Colonoscopy: no; not examined. Mammogram within the last year: yes. Number of breast biopsies: 1. Up to date with pelvic  exams:  yes. Any excessive radiation exposure in the past:  no  Past Medical History:  Diagnosis Date  . Anxiety   . Arthritis   . Complication of anesthesia    DIFFICULTY WAKING UP , LAST SURGERY NECK 2012 WAS FINE PER PT HERE AT [facility]  . Depression   . Fibromyalgia   . Headache    HX MIGRAINES    . Heart murmur   . Hypertension   . Malignant neoplasm of upper-outer quadrant of left female breast ([place]) 08/23/2016  . MVP (mitral valve prolapse)    AS INFANT  . Neck pain   . Vision abnormalities     Past Surgical History:  Procedure Laterality Date  . cervical fusiion  2012  . JOINT REPLACEMENT    . MITRAL VALVE REPAIR     MVP 1970  . PORTACATH PLACEMENT Right 09/06/2016   Procedure: INSERTION PORT-A-CATH;  Surgeon: Stacy Klein, MD;  Location: [place];  Service: General;  Laterality: Right;  . RADIOACTIVE SEED GUIDED MASTECTOMY WITH AXILLARY SENTINEL LYMPH NODE BIOPSY Left 09/06/2016   Procedure: RADIOACTIVE SEED GUIDED LEFT BREAST LUMPECTOMY  WITH SENTINEL LYMPH NODE BIOPSY;  Surgeon: Stacy Klein, MD;  Location: [place];  Service: General;  Laterality: Left;  . ROTATOR CUFF REPAIR Right 2012  . TOTAL HIP ARTHROPLASTY Right 09/14/2015   Procedure: RIGHT TOTAL HIP ARTHROPLASTY ANTERIOR APPROACH;  Surgeon: Leandrew Koyanagi, MD;  Location: [facility];  Service: Orthopedics;  Laterality: Right;  . TUBAL LIGATION  1988    Social History   Social History  . Marital status: Married    Spouse name: N/A  . Number of children: N/A  . Years of education: N/A   Social History Main Topics  . Smoking status: Current Every Day Smoker    Packs/day: 0.25    Types: Cigarettes  . Smokeless tobacco: Never Used  . Alcohol use No  . Drug use: No  . Sexual activity: Yes    Birth control/ protection: Post-menopausal   Other Topics Concern  . Not on file   Social History Narrative  . No narrative on file     FAMILY HISTORY:  We obtained a detailed,  4-generation family history.  Significant diagnoses are listed below: Family History  Problem Relation Age of Onset  . Adopted: Yes  . Breast cancer Brother 28    Maternal half-brother. Currently 59.   Stacy [name] has 4 sons (ages 54, 30, 10, and 68). None have cancer histories. She also has 4 grandsons and one granddaughter. Though Stacy [name] was adopted in infancy, she recently connected with some of her biological siblings and has obtained the following information: She has two (likely) full-sisters, Stacy [name] and Stacy [name]. Stacy [name] is 64 without cancers. Stacy [name] is 29 without cancers, but has a breast biopsy pending. [name] has 3 maternal-half siblings. Her maternal half-brother, Stacy [name], is currently 60 and had breast cancer around age 84. Her maternal-half sister, Stacy [name], is 50 without cancers. Another maternal half-brother, Stacy [name], is 49, but she has no other information about Stacy [name]. [name] biological mother died by suicide at age 73. The man she thinks is her biological father died in his late-50s from an overdose.  [name] is unaware of previous family history of genetic testing for hereditary cancer risks. Ancestry-based genetic testing indicated that Stacy [name] is of general European/Caucasian/German/Irish descent. There is no reported Ashkenazi Jewish ancestry. There is no known consanguinity.  GENETIC COUNSELING ASSESSMENT: Stacy [name] is a 56 y.o. female with a personal and family history of breast cancer which is somewhat suggestive of hereditary cancer syndromes and predisposition to cancer. We, therefore, discussed and recommended the following at today's visit.   DISCUSSION: We reviewed the characteristics, features and inheritance patterns of hereditary cancer syndromes. We also discussed genetic testing, including the appropriate family members to test, the process of testing, insurance coverage and turn-around-time for results. We discussed the implications of a negative, positive  and/or variant of uncertain significant result. We recommended Stacy [name] pursue genetic testing for the 46-gene Common Hereditary Cancers Panel offered by [facility].   Based on Stacy [name]'s personal and family history of cancer, she meets medical criteria for genetic testing. Despite that she meets criteria, she may still have an out of pocket cost. We discussed that if her out of pocket cost for testing is over $100, the laboratory will call and confirm whether she wants to proceed with testing.  If the out of pocket cost of testing is less than $100 she will be billed by the genetic testing laboratory.   We reviewed the characteristics, features and inheritance patterns of hereditary cancer syndromes. We also discussed genetic testing, including the appropriate family members to test, the process of testing, insurance coverage and turn-around-time for results. We discussed the implications of a negative, positive and/or variant of uncertain significant result. Stacy [name] elected to pursue testing  through [facility]'s 46-gene Common Hereditary Cancers Panel. Because Stacy [name] has limited information regarding the health of biological family members, she was offered a more extensive panel. However, she declined a more extensive panel in attempt to maximize yield, but minimize risk of uncertain results.   PLAN: After considering the risks, benefits, and limitations, Stacy [name]  provided informed consent to pursue genetic testing and the blood sample was sent to [facility] for analysis of the 46-gene Common Hereditary Cancers Panel. Results should be available within approximately 3 weeks' time, at which point they will be disclosed by telephone to Stacy [name], as will any additional recommendations warranted by these results. Ms. Cragin will receive a summary of her genetic counseling visit and a copy of her results once available. This information will also be available in Epic. We encouraged Ms. Matusik to  remain in contact with cancer genetics annually so that we can continuously update the family history and inform her of any changes in cancer genetics and testing that may be of benefit for her family. Ms. Bradwell questions were answered to her satisfaction today. Our contact information was provided should additional questions or concerns arise.  Lastly, we encouraged Ms. Ranum to remain in contact with cancer genetics annually so that we can continuously update the family history and inform her of any changes in cancer genetics and testing that may be of benefit for this family.   Ms.  Wall questions were answered to her satisfaction today. Our contact information was provided should additional questions or concerns arise. Thank you for the referral and allowing [place] to share in the care of your patient.   Mal Misty, MS, [facility] Certified Naval [email or web address] phone: [phone]  The patient was seen for a total of 30 minutes in face-to-face genetic counseling.  This patient was discussed with Drs. Magrinat, Stacy [name] and/or Burr Medico who agrees with the above.    _______________________________________________________________________ For Office Staff:  Number of people involved in session: 1 Was an Intern/ student involved with case: no

## 2016-12-13 MED FILL — PANTOPRAZOLE SOD DR 40 MG T: 40 | 30 days supply | Qty: 30 | Fill #1

## 2016-12-18 ENCOUNTER — Ambulatory Visit (HOSPITAL_BASED_OUTPATIENT_CLINIC_OR_DEPARTMENT_OTHER): Payer: BLUE CROSS/BLUE SHIELD | Admitting: Nurse Practitioner

## 2016-12-18 VITALS — BP 124/92 | HR 96 | Temp 97.9°F | Resp 20 | Ht 64.0 in | Wt 217.3 lb

## 2016-12-18 DIAGNOSIS — C50412 Malignant neoplasm of upper-outer quadrant of left female breast: Secondary | ICD-10-CM | POA: Diagnosis not present

## 2016-12-18 DIAGNOSIS — E860 Dehydration: Secondary | ICD-10-CM

## 2016-12-18 DIAGNOSIS — Z95828 Presence of other vascular implants and grafts: Secondary | ICD-10-CM

## 2016-12-18 MED ORDER — PROCHLORPERAZINE MALEATE 10 MG PO TABS
10.0000 mg | ORAL_TABLET | Freq: Once | ORAL | Status: AC
  Administered 2016-12-18: 10 mg via ORAL

## 2016-12-18 MED ORDER — PROCHLORPERAZINE MALEATE 10 MG PO TABS
ORAL_TABLET | ORAL | Status: AC
  Filled 2016-12-18: qty 1

## 2016-12-18 MED ORDER — SODIUM CHLORIDE 0.9 % IV SOLN
Freq: Once | INTRAVENOUS | Status: AC
  Administered 2016-12-18: 10:00:00 via INTRAVENOUS

## 2016-12-18 MED ORDER — HEPARIN SOD (PORK) LOCK FLUSH 100 UNIT/ML IV SOLN
500.0000 [IU] | Freq: Once | INTRAVENOUS | Status: AC | PRN
  Administered 2016-12-18: 500 [IU] via INTRAVENOUS
  Filled 2016-12-18: qty 5

## 2016-12-18 MED ORDER — SODIUM CHLORIDE 0.9% FLUSH
10.0000 mL | INTRAVENOUS | Status: DC | PRN
  Administered 2016-12-18: 10 mL via INTRAVENOUS
  Filled 2016-12-18: qty 10

## 2016-12-18 MED FILL — PROCHLORPERAZINE 10 MG TAB: 10 | 8 days supply | Qty: 30 | Fill #1

## 2016-12-18 NOTE — Progress Notes (Signed)
RN visit for IV fluids. 

## 2016-12-18 NOTE — Patient Instructions (Signed)
Dehydration, Adult Dehydration is a condition in which there is not enough fluid or water in the body. This happens when you lose more fluids than you take in. Important organs, such as the kidneys, brain, and heart, cannot function without a proper amount of fluids. Any loss of fluids from the body can lead to dehydration. Dehydration can range from mild to severe. This condition should be treated right away to prevent it from becoming severe. What are the causes? This condition may be caused by:  Vomiting.  Diarrhea.  Excessive sweating, such as from heat exposure or exercise.  Not drinking enough fluid, especially:  When ill.  While doing activity that requires a lot of energy.  Excessive urination.  Fever.  Infection.  Certain medicines, such as medicines that cause the body to lose excess fluid (diuretics).  Inability to access safe drinking water.  Reduced physical ability to get adequate water and food. What increases the risk? This condition is more likely to develop in people:  Who have a poorly controlled long-term (chronic) illness, such as diabetes, heart disease, or kidney disease.  Who are age 65 or older.  Who are disabled.  Who live in a place with high altitude.  Who play endurance sports. What are the signs or symptoms? Symptoms of mild dehydration may include:   Thirst.  Dry lips.  Slightly dry mouth.  Dry, warm skin.  Dizziness. Symptoms of moderate dehydration may include:   Very dry mouth.  Muscle cramps.  Dark urine. Urine may be the color of tea.  Decreased urine production.  Decreased tear production.  Heartbeat that is irregular or faster than normal (palpitations).  Headache.  Light-headedness, especially when you stand up from a sitting position.  Fainting (syncope). Symptoms of severe dehydration may include:   Changes in skin, such as:  Cold and clammy skin.  Blotchy (mottled) or pale skin.  Skin that does  not quickly return to normal after being lightly pinched and released (poor skin turgor).  Changes in body fluids, such as:  Extreme thirst.  No tear production.  Inability to sweat when body temperature is high, such as in hot weather.  Very little urine production.  Changes in vital signs, such as:  Weak pulse.  Pulse that is more than 100 beats a minute when sitting still.  Rapid breathing.  Low blood pressure.  Other changes, such as:  Sunken eyes.  Cold hands and feet.  Confusion.  Lack of energy (lethargy).  Difficulty waking up from sleep.  Short-term weight loss.  Unconsciousness. How is this diagnosed? This condition is diagnosed based on your symptoms and a physical exam. Blood and urine tests may be done to help confirm the diagnosis. How is this treated? Treatment for this condition depends on the severity. Mild or moderate dehydration can often be treated at home. Treatment should be started right away. Do not wait until dehydration becomes severe. Severe dehydration is an emergency and it needs to be treated in a hospital. Treatment for mild dehydration may include:   Drinking more fluids.  Replacing salts and minerals in your blood (electrolytes) that you may have lost. Treatment for moderate dehydration may include:   Drinking an oral rehydration solution (ORS). This is a drink that helps you replace fluids and electrolytes (rehydrate). It can be found at pharmacies and retail stores. Treatment for severe dehydration may include:   Receiving fluids through an IV tube.  Receiving an electrolyte solution through a feeding tube that is   passed through your nose and into your stomach (nasogastric tube, or NG tube).  Correcting any abnormalities in electrolytes.  Treating the underlying cause of dehydration. Follow these instructions at home:  If directed by your health care provider, drink an ORS:  Make an ORS by following instructions on the  package.  Start by drinking small amounts, about  cup (120 mL) every 5-10 minutes.  Slowly increase how much you drink until you have taken the amount recommended by your health care provider.  Drink enough clear fluid to keep your urine clear or pale yellow. If you were told to drink an ORS, finish the ORS first, then start slowly drinking other clear fluids. Drink fluids such as:  Water. Do not drink only water. Doing that can lead to having too little salt (sodium) in the body (hyponatremia).  Ice chips.  Fruit juice that you have added water to (diluted fruit juice).  Low-calorie sports drinks.  Avoid:  Alcohol.  Drinks that contain a lot of sugar. These include high-calorie sports drinks, fruit juice that is not diluted, and soda.  Caffeine.  Foods that are greasy or contain a lot of fat or sugar.  Take over-the-counter and prescription medicines only as told by your health care provider.  Do not take sodium tablets. This can lead to having too much sodium in the body (hypernatremia).  Eat foods that contain a healthy balance of electrolytes, such as bananas, oranges, potatoes, tomatoes, and spinach.  Keep all follow-up visits as told by your health care provider. This is important. Contact a health care provider if:  You have abdominal pain that:  Gets worse.  Stays in one area (localizes).  You have a rash.  You have a stiff neck.  You are more irritable than usual.  You are sleepier or more difficult to wake up than usual.  You feel weak or dizzy.  You feel very thirsty.  You have urinated only a small amount of very dark urine over 6-8 hours. Get help right away if:  You have symptoms of severe dehydration.  You cannot drink fluids without vomiting.  Your symptoms get worse with treatment.  You have a fever.  You have a severe headache.  You have vomiting or diarrhea that:  Gets worse.  Does not go away.  You have blood or green matter  (bile) in your vomit.  You have blood in your stool. This may cause stool to look black and tarry.  You have not urinated in 6-8 hours.  You faint.  Your heart rate while sitting still is over 100 beats a minute.  You have trouble breathing. This information is not intended to replace advice given to you by your health care provider. Make sure you discuss any questions you have with your health care provider. Document Released: 09/03/2005 Document Revised: 03/30/2016 Document Reviewed: 10/28/2015 Elsevier Interactive Patient Education  2017 [employer]   Nausea, Adult Nausea is the feeling of an upset stomach or having to vomit. Nausea on its own is not usually a serious concern, but it may be an early sign of a more serious medical problem. As nausea gets worse, it can lead to vomiting. If vomiting develops, or if you are not able to drink enough fluids, you are at risk of becoming dehydrated. Dehydration can make you tired and thirsty, cause you to have a dry mouth, and decrease how often you urinate. Older adults and people with other diseases or a weak immune system are  at higher risk for dehydration. The main goals of treating your nausea are:  To limit repeated nausea episodes.  To prevent vomiting and dehydration. Follow these instructions at home: Follow instructions from your health care provider about how to care for yourself at home. Eating and drinking  Follow these recommendations as told by your health care provider:  Take an oral rehydration solution (ORS). This is a drink that is sold at pharmacies and retail stores.  Drink clear fluids in small amounts as you are able. Clear fluids include water, ice chips, diluted fruit juice, and low-calorie sports drinks.  Eat bland, easy-to-digest foods in small amounts as you are able. These foods include bananas, applesauce, rice, lean meats, toast, and crackers.  Avoid drinking fluids that contain a lot of sugar or  caffeine, such as energy drinks, sports drinks, and soda.  Avoid alcohol.  Avoid spicy or fatty foods. General instructions   Drink enough fluid to keep your urine clear or pale yellow.  Wash your hands often. If soap and water are not available, use hand sanitizer.  Make sure that all people in your household wash their hands well and often.  Rest at home while you recover.  Take over-the-counter and prescription medicines only as told by your health care provider.  Breathe slowly and deeply when you feel nauseous.  Watch your condition for any changes.  Keep all follow-up visits as told by your health care provider. This is important. Contact a health care provider if:  You have a headache.  You have new symptoms.  Your nausea gets worse.  You have a fever.  You feel light-headed or dizzy.  You vomit.  You cannot keep fluids down. Get help right away if:  You have pain in your chest, neck, arm, or jaw.  You feel extremely weak or you faint.  You have vomit that is bright red or looks like coffee grounds.  You have bloody or black stools or stools that look like tar.  You have a severe headache, a stiff neck, or both.  You have severe pain, cramping, or bloating in your abdomen.  You have a rash.  You have difficulty breathing or are breathing very quickly.  Your heart is beating very quickly.  Your skin feels cold and clammy.  You feel confused.  You have pain when you urinate.  You have signs of dehydration, such as:  Dark urine, very little, or no urine.  Cracked lips.  Dry mouth.  Sunken eyes.  Sleepiness.  Weakness. These symptoms may represent a serious problem that is an emergency. Do not wait to see if the symptoms will go away. Get medical help right away. Call your local emergency services (911 in the [place] Do not drive yourself to the hospital. This information is not intended to replace advice given to you by your health care  provider. Make sure you discuss any questions you have with your health care provider. Document Released: 10/11/2004 Document Revised: 02/06/2016 Document Reviewed: 05/10/2015 Elsevier Interactive Patient Education  2017 [employer].

## 2016-12-20 ENCOUNTER — Ambulatory Visit: Payer: BLUE CROSS/BLUE SHIELD | Admitting: Nurse Practitioner

## 2016-12-21 ENCOUNTER — Ambulatory Visit: Payer: Self-pay | Admitting: Genetics

## 2016-12-21 ENCOUNTER — Telehealth: Payer: Self-pay | Admitting: Genetics

## 2016-12-21 ENCOUNTER — Encounter: Payer: Self-pay | Admitting: Genetics

## 2016-12-21 DIAGNOSIS — Z1379 Encounter for other screening for genetic and chromosomal anomalies: Secondary | ICD-10-CM

## 2016-12-21 HISTORY — DX: Encounter for other screening for genetic and chromosomal anomalies: Z13.79

## 2016-12-21 NOTE — Telephone Encounter (Deleted)
-----   Message from Mal Misty sent at 12/12/2016  3:58 PM EDT ----- Regarding: Call Results Recommend testing for maternal half-brother.

## 2016-12-21 NOTE — Telephone Encounter (Signed)
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 Discussed that we do not know why she has breast cancer or why there is cancer in the family. It could be due to a different gene that we are not testing, or maybe our current technology may not be able to pick detect all mutations.  It will be important for her to keep in contact with genetics to keep up with whether additional testing may be needed. Genetic testing was recommended for her maternal half-brother with breast cancer. 

## 2016-12-21 NOTE — Progress Notes (Signed)
HPI: Stacy [name] was previously seen in the [facility] due to a personal and family history of cancer and concerns regarding a hereditary predisposition to cancer. Please refer to our prior cancer genetics clinic note for more information regarding Stacy [name]'s medical, social and family histories, and our assessment and recommendations, at the time. Stacy [name] recent genetic test results were disclosed to her, as were recommendations warranted by these results. These results and recommendations are discussed in more detail below.  CANCER HISTORY:    Malignant neoplasm of upper-outer quadrant of left female breast ([place])   08/21/2016 Initial Diagnosis    Screening detected left breast mass UOQ at 1:00: 1.2 x 1 x 0.5 cm, axilla negative Left breast biopsy 1:00: IDC with DCIS, grade 1, ER 90%, PR 70%, Ki-67 30%, HER-2 positive ratio 2.43, T1c N0 stage IA clinical stage      09/06/2016 Surgery    Left lumpectomy: IDC grade 2, 3.4 cm, Margins Neg, 0/1 LN neg, ER 90%, PR 70%, Ki-67 30%, HER-2 positive ratio 2.43 T2N0 (stage 2 A)      10/09/2016 -  Chemotherapy    TCH Perjeta 6 cycles followed by Herceptin Perjeta maintenance for 1 year       FAMILY HISTORY:  We obtained a detailed, 4-generation family history.  Significant diagnoses are listed below: Family History  Problem Relation Age of Onset  . Adopted: Yes  . Breast cancer Brother 77    Maternal half-brother. Currently 24.    GENETIC TEST RESULTS: Genetic testing reported out on 12/20/2016 through [facility]'s 46-gene Common Hereditary Cancers panel found no deleterious mutations. The following 46 genes were analyzed: APC, ATM, AXIN2, BARD1, BMPR1A, BRCA1, BRCA2, BRIP1, CDH1, CDKN2A, CHEK2, CTNNA1, DICER1, EPCAM, GREM1, KIT, MEN1, MLH1, MSH2, MSH3, MSH6, MUTYH, NBN, NF1, NTHL1, PALB2, PDGFRA, PMS2, POLD1, POLE, PTEN, RAD50, RAD51C, RAD51D, SDHA, SDHB, SDHC, SDHD, SMAD4, SMARCA4, STK11, TP53, TSC1, TSC2, and VHL.   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 At this time, it is unknown if this variant is associated with increased cancer risk or if this is a normal finding, but most variants such as this get reclassified to being inconsequential. It should not be used to make medical management decisions. With time, we suspect the lab will determine the significance of this variant, if any. If we do learn more about it, we will try to contact Stacy [name] to discuss it further. However, it is important to stay in touch with [place] periodically and keep the address and phone number up to date. The test report has will be scanned into EPIC and will be located under the Molecular Pathology section of the Results Review tab.A portion of the result report is included below for reference.    We discussed with Stacy [name] that since the current genetic testing is not perfect, it is possible there may be a gene mutation in one of these genes that current testing cannot detect, but that chance is small. We also discussed, that it is possible that another gene that has not yet been discovered, or that we have not yet tested, is responsible for the cancer diagnoses in the family, and it is, therefore, important to remain in touch with cancer genetics in the future so that we can continue to offer Stacy [name] the most up to date genetic testing.   CANCER SCREENING RECOMMENDATIONS: Though we still do not have an explanation for why Stacy [name].  [name] and her maternal half-brother have had breast cancer, these negative results do not indicate an increased risk for other cancer types. Therefore, based on her genetic testing results, we have no reason at this time to suspect that Stacy [name] risk for other types of cancer is anything other than average. Stacy [name] is recommended to continue cancer screenings and treatments as recommended by her physicians. Her genetic testing does not warrant a change in  her care at this time.  RECOMMENDATIONS FOR FAMILY MEMBERS: Women and men in this family might be at some increased risk of developing cancer, over the general population risk, simply due to the family history of cancer. We recommended women in this family have a yearly mammogram beginning at age 43, or 28 years younger than the earliest onset of cancer, an annual clinical breast exam, and perform monthly breast self-exams. Women in this family should also have a gynecological exam as recommended by their primary provider. Men in this family should consult their physicians regarding self and clinical breast exams. All family members should have a colonoscopy by age 15.  Based on Stacy [name]'s family history, we recommended her maternal half-brother, Stacy [name], who was diagnosed with breast at age 34, have genetic counseling and testing. Stacy [name] will let us know if we can be of any assistance in coordinating genetic counseling and/or testing for this family member. If Stacy [name] undergoes genetic testing in the future, we recommend Stacy [name] inform us of the results as this could impact her risk assessment.  FOLLOW-UP: Lastly, we discussed with Stacy [name] that cancer genetics is a rapidly advancing field and it is possible that new genetic tests will be appropriate for her and/or her family members in the future. We encouraged her to remain in contact with cancer genetics on an annual basis so we can update her personal and family histories and let her know of advances in cancer genetics that may benefit this family.   Our contact number was provided. Stacy [name] questions were answered to her satisfaction, and she knows she is welcome to call us at anytime with additional questions or concerns.   Stacy Misty, Stacy [name], [facility] Certified [employer] [email or web address]

## 2017-01-01 ENCOUNTER — Ambulatory Visit: Payer: BLUE CROSS/BLUE SHIELD | Admitting: Nurse Practitioner

## 2017-01-01 ENCOUNTER — Ambulatory Visit (HOSPITAL_BASED_OUTPATIENT_CLINIC_OR_DEPARTMENT_OTHER): Payer: BLUE CROSS/BLUE SHIELD | Admitting: Hematology and Oncology

## 2017-01-01 ENCOUNTER — Encounter: Payer: Self-pay | Admitting: Hematology and Oncology

## 2017-01-01 ENCOUNTER — Ambulatory Visit: Payer: BLUE CROSS/BLUE SHIELD

## 2017-01-01 ENCOUNTER — Other Ambulatory Visit (HOSPITAL_BASED_OUTPATIENT_CLINIC_OR_DEPARTMENT_OTHER): Payer: BLUE CROSS/BLUE SHIELD

## 2017-01-01 ENCOUNTER — Ambulatory Visit (HOSPITAL_BASED_OUTPATIENT_CLINIC_OR_DEPARTMENT_OTHER): Payer: BLUE CROSS/BLUE SHIELD

## 2017-01-01 ENCOUNTER — Encounter: Payer: Self-pay | Admitting: *Deleted

## 2017-01-01 DIAGNOSIS — Z5111 Encounter for antineoplastic chemotherapy: Secondary | ICD-10-CM | POA: Diagnosis not present

## 2017-01-01 DIAGNOSIS — Z170 Estrogen receptor positive status [ER+]: Secondary | ICD-10-CM

## 2017-01-01 DIAGNOSIS — C50412 Malignant neoplasm of upper-outer quadrant of left female breast: Secondary | ICD-10-CM | POA: Diagnosis not present

## 2017-01-01 DIAGNOSIS — R110 Nausea: Secondary | ICD-10-CM | POA: Diagnosis not present

## 2017-01-01 DIAGNOSIS — R5383 Other fatigue: Secondary | ICD-10-CM | POA: Diagnosis not present

## 2017-01-01 DIAGNOSIS — Z95828 Presence of other vascular implants and grafts: Secondary | ICD-10-CM

## 2017-01-01 DIAGNOSIS — Z5112 Encounter for antineoplastic immunotherapy: Secondary | ICD-10-CM

## 2017-01-01 LAB — COMPREHENSIVE METABOLIC PANEL
ALK PHOS: 64 U/L (ref 40–150)
ALT: 13 U/L (ref 0–55)
AST: 16 U/L (ref 5–34)
Albumin: 3.7 g/dL (ref 3.5–5.0)
Anion Gap: 12 mEq/L — ABNORMAL HIGH (ref 3–11)
BUN: 12.7 mg/dL (ref 7.0–26.0)
CHLORIDE: 107 meq/L (ref 98–109)
CO2: 24 mEq/L (ref 22–29)
Calcium: 9.8 mg/dL (ref 8.4–10.4)
Creatinine: 1 mg/dL (ref 0.6–1.1)
EGFR: 62 mL/min/{1.73_m2} — AB (ref >90)
GLUCOSE: 105 mg/dL (ref 70–140)
POTASSIUM: 3.8 meq/L (ref 3.5–5.1)
SODIUM: 143 meq/L (ref 136–145)
Total Bilirubin: 0.41 mg/dL (ref 0.20–1.20)
Total Protein: 7 g/dL (ref 6.4–8.3)

## 2017-01-01 LAB — CBC WITH DIFFERENTIAL/PLATELET
BASO%: 0.2 % (ref 0.0–2.0)
Basophils Absolute: 0 10*3/uL (ref 0.0–0.1)
EOS%: 0.1 % (ref 0.0–7.0)
Eosinophils Absolute: 0 10*3/uL (ref 0.0–0.5)
HCT: 32.6 % — ABNORMAL LOW (ref 34.8–46.6)
HGB: 10.8 g/dL — ABNORMAL LOW (ref 11.6–15.9)
LYMPH#: 1.4 10*3/uL (ref 0.9–3.3)
LYMPH%: 16.1 % (ref 14.0–49.7)
MCH: 30.7 pg (ref 25.1–34.0)
MCHC: 33.1 g/dL (ref 31.5–36.0)
MCV: 92.6 fL (ref 79.5–101.0)
MONO#: 0.7 10*3/uL (ref 0.1–0.9)
MONO%: 7.6 % (ref 0.0–14.0)
NEUT%: 76 % (ref 38.4–76.8)
NEUTROS ABS: 6.5 10*3/uL (ref 1.5–6.5)
Platelets: 231 10*3/uL (ref 145–400)
RBC: 3.52 10*6/uL — AB (ref 3.70–5.45)
RDW: 17.7 % — ABNORMAL HIGH (ref 11.2–14.5)
WBC: 8.5 10*3/uL (ref 3.9–10.3)

## 2017-01-01 MED ORDER — FOSAPREPITANT DIMEGLUMINE INJECTION 150 MG
Freq: Once | INTRAVENOUS | Status: AC
  Administered 2017-01-01: 11:00:00 via INTRAVENOUS
  Filled 2017-01-01: qty 5

## 2017-01-01 MED ORDER — SODIUM CHLORIDE 0.9% FLUSH
10.0000 mL | INTRAVENOUS | Status: DC | PRN
  Administered 2017-01-01: 10 mL
  Filled 2017-01-01: qty 10

## 2017-01-01 MED ORDER — SODIUM CHLORIDE 0.9 % IV SOLN: Freq: Once | INTRAVENOUS | Status: DC

## 2017-01-01 MED ORDER — SODIUM CHLORIDE 0.9 % IV SOLN
600.0000 mg | Freq: Once | INTRAVENOUS | Status: AC
  Administered 2017-01-01: 600 mg via INTRAVENOUS
  Filled 2017-01-01: qty 60

## 2017-01-01 MED ORDER — ACETAMINOPHEN 325 MG PO TABS
650.0000 mg | ORAL_TABLET | Freq: Once | ORAL | Status: AC
  Administered 2017-01-01: 650 mg via ORAL

## 2017-01-01 MED ORDER — DEXTROSE 5 % IV SOLN
50.0000 mg/m2 | Freq: Once | INTRAVENOUS | Status: AC
  Administered 2017-01-01: 110 mg via INTRAVENOUS
  Filled 2017-01-01: qty 11

## 2017-01-01 MED ORDER — HEPARIN SOD (PORK) LOCK FLUSH 100 UNIT/ML IV SOLN
500.0000 [IU] | Freq: Once | INTRAVENOUS | Status: AC | PRN
  Administered 2017-01-01: 500 [IU]
  Filled 2017-01-01: qty 5

## 2017-01-01 MED ORDER — SODIUM CHLORIDE 0.9 % IV SOLN
420.0000 mg | Freq: Once | INTRAVENOUS | Status: AC
  Administered 2017-01-01: 420 mg via INTRAVENOUS
  Filled 2017-01-01: qty 14

## 2017-01-01 MED ORDER — HEPARIN SOD (PORK) LOCK FLUSH 100 UNIT/ML IV SOLN
500.0000 [IU] | Freq: Once | INTRAVENOUS | Status: DC | PRN
  Filled 2017-01-01: qty 5

## 2017-01-01 MED ORDER — TRASTUZUMAB CHEMO 150 MG IV SOLR
6.0000 mg/kg | Freq: Once | INTRAVENOUS | Status: AC
  Administered 2017-01-01: 630 mg via INTRAVENOUS
  Filled 2017-01-01: qty 30

## 2017-01-01 MED ORDER — SODIUM CHLORIDE 0.9% FLUSH
10.0000 mL | INTRAVENOUS | Status: DC | PRN
  Administered 2017-01-01: 10 mL via INTRAVENOUS
  Filled 2017-01-01: qty 10

## 2017-01-01 MED ORDER — DIPHENHYDRAMINE HCL 25 MG PO CAPS
50.0000 mg | ORAL_CAPSULE | Freq: Once | ORAL | Status: AC
  Administered 2017-01-01: 50 mg via ORAL

## 2017-01-01 MED ORDER — PALONOSETRON HCL INJECTION 0.25 MG/5ML
0.2500 mg | Freq: Once | INTRAVENOUS | Status: AC
  Administered 2017-01-01: 0.25 mg via INTRAVENOUS

## 2017-01-01 MED ORDER — SODIUM CHLORIDE 0.9 % IV SOLN
Freq: Once | INTRAVENOUS | Status: AC
  Administered 2017-01-01: 11:00:00 via INTRAVENOUS

## 2017-01-01 NOTE — Patient Instructions (Signed)

## 2017-01-01 NOTE — Progress Notes (Signed)
Patient Care Team: [name], NP as PCP - General (Internal Medicine) [name], MD as Consulting Physician (General Surgery) Nicholas Lose, MD as Consulting Physician (Hematology and Oncology) Gery Pray, MD as Consulting Physician (Radiation Oncology) Gardenia Phlegm, NP as Nurse Practitioner (Hematology and Oncology)  DIAGNOSIS:  Encounter Diagnosis  Name Primary?  . Malignant neoplasm of upper-outer quadrant of left breast in female, estrogen receptor positive ([place])     SUMMARY OF ONCOLOGIC HISTORY:   Malignant neoplasm of upper-outer quadrant of left female breast ([place])   08/21/2016 Initial Diagnosis    Screening detected left breast mass UOQ at 1:00: 1.2 x 1 x 0.5 cm, axilla negative Left breast biopsy 1:00: IDC with DCIS, grade 1, ER 90%, PR 70%, Ki-67 30%, HER-2 positive ratio 2.43, T1c N0 stage IA clinical stage      09/06/2016 Surgery    Left lumpectomy: IDC grade 2, 3.4 cm, Margins Neg, 0/1 LN neg, ER 90%, PR 70%, Ki-67 30%, HER-2 positive ratio 2.43 T2N0 (stage 2 A)      10/09/2016 -  Chemotherapy    TCH Perjeta 6 cycles followed by Herceptin Perjeta maintenance for 1 year      12/20/2016 Genetic Testing    Genetic counseling and testing for hereditary cancer syndromes performed on 11/22/2016. Results are negative for pathogenic mutations in 46 genes analyzed by [facility]'s Common Hereditary Cancers Panel. Results are dated 12/20/2016. Genes tested: APC, ATM, AXIN2, BARD1, BMPR1A, BRCA1, BRCA2, BRIP1, CDH1, CDKN2A, CHEK2, CTNNA1, DICER1, EPCAM, GREM1, KIT, MEN1, MLH1, MSH2, MSH3, MSH6, MUTYH, NBN, NF1, NTHL1, PALB2, PDGFRA, PMS2, POLD1, POLE, PTEN, RAD50, RAD51C, RAD51D, SDHA, SDHB, SDHC, SDHD, SMAD4, SMARCA4, STK11, TP53, TSC1, TSC2, and VHL.  A variant of uncertain significance (not clinically actionable) was noted in the KIT gene called c.2601C>G (p.Ser867Arg). At this time, it is unknown if this variant is associated with increased cancer risk or if this  is a normal finding. It should not be used to make medical management decisions.       CHIEF COMPLIANT: Cycle 5 TCHP  INTERVAL HISTORY: Stacy [name] is a 56 year old with above-mentioned history of left breast cancer currently on adjuvant chemotherapy with TCH Perjeta. Stacy [name] is tolerating it moderately well. With adjustment of the dosage Stacy [name] is now able to tolerate it better. Stacy [name] continues to have mild nausea. Stacy [name] has fatigue as well. Diarrhea is under good control. Stacy [name] is eating and drinking well.  REVIEW OF SYSTEMS:   Constitutional: Denies fevers, chills or abnormal weight loss Eyes: Denies blurriness of vision Ears, nose, mouth, throat, and face: Denies mucositis or sore throat Respiratory: Denies cough, dyspnea or wheezes Cardiovascular: Denies palpitation, chest discomfort Gastrointestinal:  Denies nausea, heartburn or change in bowel habits Skin: Denies abnormal skin rashes Lymphatics: Denies new lymphadenopathy or easy bruising Neurological:Denies numbness, tingling or new weaknesses Behavioral/Psych: Mood is stable, no new changes  Extremities: No lower extremity edema All other systems were reviewed with the patient and are negative.  I have reviewed the past medical history, past surgical history, social history and family history with the patient and they are unchanged from previous note.  ALLERGIES:  is allergic to naproxen; erythromycin; tramadol hcl; gabapentin; and neulasta [pegfilgrastim].  MEDICATIONS:  Current Outpatient Prescriptions  Medication Sig Dispense Refill  . dexamethasone (DECADRON) 4 MG tablet Take 1 tablet (4 mg total) by mouth daily. Take 1 tablet daily before chemotherapy and 1 tablet the day after with food. 30 tablet 1  . diphenoxylate-atropine (LOMOTIL) 2.5-0.025  MG tablet Take 1 tablet by mouth every 8 (eight) hours as needed for diarrhea or loose stools. (Patient not taking: Reported on 11/07/2016) 30 tablet 0  . hydrochlorothiazide (HYDRODIURIL)  25 MG tablet TAKE 1 TABLET BY MOUTH EVERY DAY 30 tablet 5  . ibuprofen (ADVIL,MOTRIN) 200 MG tablet Take 400 mg by mouth every 4 (four) hours as needed for headache, mild pain or moderate pain.     Marland Kitchen lidocaine-prilocaine (EMLA) cream Apply to affected area once 30 g 3  . LORazepam (ATIVAN) 0.5 MG tablet Take 1 tablet (0.5 mg total) by mouth every 6 (six) hours as needed (Nausea or vomiting). (Patient not taking: Reported on 11/27/2016) 30 tablet 0  . ondansetron (ZOFRAN) 8 MG tablet TAKE 1 TABLET BY MOUTH TWICE DAILY AS NEEDED FOR REFRACTORY NAUSEA AND VOMITING. START ON DAY 3 AFTER CHEMO (Patient not taking: Reported on 11/30/2016) 30 tablet 1  . pantoprazole (PROTONIX) 40 MG tablet Take 1 tablet (40 mg total) by mouth daily. 30 tablet 3  . prochlorperazine (COMPAZINE) 10 MG tablet Take 1 tablet (10 mg total) by mouth every 6 (six) hours as needed (Nausea or vomiting). 30 tablet 1   No current facility-administered medications for this visit.    Facility-Administered Medications Ordered in Other Visits  Medication Dose Route Frequency Provider Last Rate Last Dose  . 0.9 %  sodium chloride infusion   Intravenous Once Nicholas Lose, MD      . CARBOplatin (PARAPLATIN) 600 mg in sodium chloride 0.9 % 250 mL chemo infusion  600 mg Intravenous Once Nicholas Lose, MD      . heparin lock flush 100 unit/mL  500 Units Intracatheter Once PRN Nicholas Lose, MD      . sodium chloride flush (NS) 0.9 % injection 10 mL  10 mL Intracatheter PRN Nicholas Lose, MD        PHYSICAL EXAMINATION: ECOG PERFORMANCE STATUS: 1 - Symptomatic but completely ambulatory  Vitals:   01/01/17 1010  BP: 139/83  Pulse: 80  Resp: 18  Temp: 98.3 F (36.8 C)   Filed Weights   01/01/17 1010  Weight: 223 lb 6.4 oz (101.3 kg)    GENERAL:alert, no distress and comfortable SKIN: skin color, texture, turgor are normal, no rashes or significant lesions EYES: normal, Conjunctiva are pink and non-injected, sclera  clear OROPHARYNX:no exudate, no erythema and lips, buccal mucosa, and tongue normal  NECK: supple, thyroid normal size, non-tender, without nodularity LYMPH:  no palpable lymphadenopathy in the cervical, axillary or inguinal LUNGS: clear to auscultation and percussion with normal breathing effort HEART: regular rate & rhythm and no murmurs and no lower extremity edema ABDOMEN:abdomen soft, non-tender and normal bowel sounds MUSCULOSKELETAL:no cyanosis of digits and no clubbing  NEURO: alert & oriented x 3 with fluent speech, no focal motor/sensory deficits EXTREMITIES: No lower extremity edema BREAST: No palpable masses or nodules in either right or left breasts. No palpable axillary supraclavicular or infraclavicular adenopathy no breast tenderness or nipple discharge. (exam performed in the presence of a chaperone)  LABORATORY DATA:  I have reviewed the data as listed   Chemistry      Component Value Date/Time   NA 143 01/01/2017 0853   K 3.8 01/01/2017 0853   CL 105 11/09/2016 2307   CO2 24 01/01/2017 0853   BUN 12.7 01/01/2017 0853   CREATININE 1.0 01/01/2017 0853      Component Value Date/Time   CALCIUM 9.8 01/01/2017 0853   ALKPHOS 64 01/01/2017 0853   AST  16 01/01/2017 0853   ALT 13 01/01/2017 0853   BILITOT 0.41 01/01/2017 0853       Lab Results  Component Value Date   WBC 8.5 01/01/2017   HGB 10.8 (L) 01/01/2017   HCT 32.6 (L) 01/01/2017   MCV 92.6 01/01/2017   PLT 231 01/01/2017   NEUTROABS 6.5 01/01/2017    ASSESSMENT & PLAN:  Malignant neoplasm of upper-outer quadrant of left female breast (HCC) Left lumpectomy 09/06/16: IDC grade 2, 3.4 cm, Margins Neg, 0/1 LN neg, T2N0 ER 90%, PR 70%, Ki-67 30%, HER-2 positive ratio 2.43 (stage 2 A)  Treatment Plan: 1. adjuvant chemotherapy TCHPfollowed by Herceptin-Perjetamaintenance to complete 1 year. 2. Adjuvant radiation therapy followed by 3. Adjuvant antiestrogen  therapy ----------------------------------------------------------------------------- Current Treatment: TCHP cycle 5 Chemo Toxicities: 1. Severe cough: Could beacid reflux. Onprotonix. 2. nausea and vomiting: Due to chemotherapy, Wereducedthe dosage of Cycle 2 of chemotherapy and further reduced on cycle 4. 3. Allergy to Neulasta: We discontinued Neulasta  4. Profound lack of taste and discomfort in the mouth 5. Generalized fatigue 6. Neutropenia with slight fevers after cycle 2: No evidence of infection but patient not treated with fluids, IV antibiotics 7. Diarrhea with hypokalemia: Required IV fluids and potassium replacement, now on Imodium and Lomotil  Plan: Reducedthe dosage with cycle 3 and cycle 4chemotherapy RTC with cycle 6   I spent 25 minutes talking to the patient of which more than half was spent in counseling and coordination of care.  No orders of the defined types were placed in this encounter.  The patient has a good understanding of the overall plan. Stacy [name] agrees with it. Stacy [name] will call with any problems that may develop before the next visit here.   Rulon Eisenmenger, MD 01/01/17

## 2017-01-01 NOTE — Assessment & Plan Note (Signed)
Left lumpectomy 09/06/16: IDC grade 2, 3.4 cm, Margins Neg, 0/1 LN neg, T2N0 ER 90%, PR 70%, Ki-67 30%, HER-2 positive ratio 2.43 (stage 2 A)  Treatment Plan: 1. adjuvant chemotherapy TCHPfollowed by Herceptin-Perjetamaintenance to complete 1 year. 2. Adjuvant radiation therapy followed by 3. Adjuvant antiestrogen therapy ----------------------------------------------------------------------------- Current Treatment: TCHP cycle 5 Chemo Toxicities: 1. Severe cough: Could beacid reflux. Onprotonix. 2. nausea and vomiting: Due to chemotherapy, Wereducedthe dosage of Cycle 2 of chemotherapy and further reduced on cycle 4. 3. Allergy to Neulasta: We discontinued Neulasta  4. Profound lack of taste and discomfort in the mouth 5. Generalized fatigue 6. Neutropenia with slight fevers after cycle 2: No evidence of infection but patient not treated with fluids, IV antibiotics 7. Diarrhea with hypokalemia: Required IV fluids and potassium replacement, now on Imodium and Lomotil  Plan: Reducedthe dosage with cycle 3 and cycle 4chemotherapy RTC with cycle 6

## 2017-01-02 ENCOUNTER — Ambulatory Visit: Payer: BLUE CROSS/BLUE SHIELD | Admitting: Nurse Practitioner

## 2017-01-08 ENCOUNTER — Ambulatory Visit (HOSPITAL_BASED_OUTPATIENT_CLINIC_OR_DEPARTMENT_OTHER): Payer: BLUE CROSS/BLUE SHIELD

## 2017-01-08 VITALS — BP 123/90 | HR 106 | Temp 98.1°F | Resp 17 | Wt 217.3 lb

## 2017-01-08 DIAGNOSIS — E860 Dehydration: Secondary | ICD-10-CM

## 2017-01-08 DIAGNOSIS — C50412 Malignant neoplasm of upper-outer quadrant of left female breast: Secondary | ICD-10-CM | POA: Diagnosis not present

## 2017-01-08 DIAGNOSIS — Z95828 Presence of other vascular implants and grafts: Secondary | ICD-10-CM

## 2017-01-08 MED ORDER — SODIUM CHLORIDE 0.9 % IV SOLN
Freq: Once | INTRAVENOUS | Status: AC
  Administered 2017-01-08: 11:00:00 via INTRAVENOUS

## 2017-01-08 MED ORDER — HEPARIN SOD (PORK) LOCK FLUSH 100 UNIT/ML IV SOLN
500.0000 [IU] | Freq: Once | INTRAVENOUS | Status: AC | PRN
  Administered 2017-01-08: 500 [IU] via INTRAVENOUS
  Filled 2017-01-08: qty 5

## 2017-01-08 MED ORDER — SODIUM CHLORIDE 0.9% FLUSH
10.0000 mL | INTRAVENOUS | Status: DC | PRN
  Administered 2017-01-08: 10 mL via INTRAVENOUS
  Filled 2017-01-08: qty 10

## 2017-01-08 NOTE — Patient Instructions (Signed)
Dehydration, Adult Dehydration is a condition in which there is not enough fluid or water in the body. This happens when you lose more fluids than you take in. Important organs, such as the kidneys, brain, and heart, cannot function without a proper amount of fluids. Any loss of fluids from the body can lead to dehydration. Dehydration can range from mild to severe. This condition should be treated right away to prevent it from becoming severe. What are the causes? This condition may be caused by:  Vomiting.  Diarrhea.  Excessive sweating, such as from heat exposure or exercise.  Not drinking enough fluid, especially:  When ill.  While doing activity that requires a lot of energy.  Excessive urination.  Fever.  Infection.  Certain medicines, such as medicines that cause the body to lose excess fluid (diuretics).  Inability to access safe drinking water.  Reduced physical ability to get adequate water and food. What increases the risk? This condition is more likely to develop in people:  Who have a poorly controlled long-term (chronic) illness, such as diabetes, heart disease, or kidney disease.  Who are age 65 or older.  Who are disabled.  Who live in a place with high altitude.  Who play endurance sports. What are the signs or symptoms? Symptoms of mild dehydration may include:   Thirst.  Dry lips.  Slightly dry mouth.  Dry, warm skin.  Dizziness. Symptoms of moderate dehydration may include:   Very dry mouth.  Muscle cramps.  Dark urine. Urine may be the color of tea.  Decreased urine production.  Decreased tear production.  Heartbeat that is irregular or faster than normal (palpitations).  Headache.  Light-headedness, especially when you stand up from a sitting position.  Fainting (syncope). Symptoms of severe dehydration may include:   Changes in skin, such as:  Cold and clammy skin.  Blotchy (mottled) or pale skin.  Skin that does  not quickly return to normal after being lightly pinched and released (poor skin turgor).  Changes in body fluids, such as:  Extreme thirst.  No tear production.  Inability to sweat when body temperature is high, such as in hot weather.  Very little urine production.  Changes in vital signs, such as:  Weak pulse.  Pulse that is more than 100 beats a minute when sitting still.  Rapid breathing.  Low blood pressure.  Other changes, such as:  Sunken eyes.  Cold hands and feet.  Confusion.  Lack of energy (lethargy).  Difficulty waking up from sleep.  Short-term weight loss.  Unconsciousness. How is this diagnosed? This condition is diagnosed based on your symptoms and a physical exam. Blood and urine tests may be done to help confirm the diagnosis. How is this treated? Treatment for this condition depends on the severity. Mild or moderate dehydration can often be treated at home. Treatment should be started right away. Do not wait until dehydration becomes severe. Severe dehydration is an emergency and it needs to be treated in a hospital. Treatment for mild dehydration may include:   Drinking more fluids.  Replacing salts and minerals in your blood (electrolytes) that you may have lost. Treatment for moderate dehydration may include:   Drinking an oral rehydration solution (ORS). This is a drink that helps you replace fluids and electrolytes (rehydrate). It can be found at pharmacies and retail stores. Treatment for severe dehydration may include:   Receiving fluids through an IV tube.  Receiving an electrolyte solution through a feeding tube that is   passed through your nose and into your stomach (nasogastric tube, or NG tube).  Correcting any abnormalities in electrolytes.  Treating the underlying cause of dehydration. Follow these instructions at home:  If directed by your health care provider, drink an ORS:  Make an ORS by following instructions on the  package.  Start by drinking small amounts, about  cup (120 mL) every 5-10 minutes.  Slowly increase how much you drink until you have taken the amount recommended by your health care provider.  Drink enough clear fluid to keep your urine clear or pale yellow. If you were told to drink an ORS, finish the ORS first, then start slowly drinking other clear fluids. Drink fluids such as:  Water. Do not drink only water. Doing that can lead to having too little salt (sodium) in the body (hyponatremia).  Ice chips.  Fruit juice that you have added water to (diluted fruit juice).  Low-calorie sports drinks.  Avoid:  Alcohol.  Drinks that contain a lot of sugar. These include high-calorie sports drinks, fruit juice that is not diluted, and soda.  Caffeine.  Foods that are greasy or contain a lot of fat or sugar.  Take over-the-counter and prescription medicines only as told by your health care provider.  Do not take sodium tablets. This can lead to having too much sodium in the body (hypernatremia).  Eat foods that contain a healthy balance of electrolytes, such as bananas, oranges, potatoes, tomatoes, and spinach.  Keep all follow-up visits as told by your health care provider. This is important. Contact a health care provider if:  You have abdominal pain that:  Gets worse.  Stays in one area (localizes).  You have a rash.  You have a stiff neck.  You are more irritable than usual.  You are sleepier or more difficult to wake up than usual.  You feel weak or dizzy.  You feel very thirsty.  You have urinated only a small amount of very dark urine over 6-8 hours. Get help right away if:  You have symptoms of severe dehydration.  You cannot drink fluids without vomiting.  Your symptoms get worse with treatment.  You have a fever.  You have a severe headache.  You have vomiting or diarrhea that:  Gets worse.  Does not go away.  You have blood or green matter  (bile) in your vomit.  You have blood in your stool. This may cause stool to look black and tarry.  You have not urinated in 6-8 hours.  You faint.  Your heart rate while sitting still is over 100 beats a minute.  You have trouble breathing. This information is not intended to replace advice given to you by your health care provider. Make sure you discuss any questions you have with your health care provider. Document Released: 09/03/2005 Document Revised: 03/30/2016 Document Reviewed: 10/28/2015 Elsevier Interactive Patient Education  2017 [employer]  

## 2017-01-08 NOTE — Progress Notes (Signed)
RN visit for IV fluids. 

## 2017-01-09 ENCOUNTER — Telehealth: Payer: Self-pay | Admitting: Hematology and Oncology

## 2017-01-09 NOTE — Telephone Encounter (Signed)
Spoke with patient in regards to her request to move her 5/9 appointments to 5/8. Decided to keep appointments on 5/9.

## 2017-01-10 DIAGNOSIS — I10 Essential (primary) hypertension: Secondary | ICD-10-CM | POA: Diagnosis not present

## 2017-01-14 DIAGNOSIS — R42 Dizziness and giddiness: Secondary | ICD-10-CM | POA: Diagnosis not present

## 2017-01-22 ENCOUNTER — Other Ambulatory Visit: Payer: Self-pay | Admitting: Hematology and Oncology

## 2017-01-22 DIAGNOSIS — Z170 Estrogen receptor positive status [ER+]: Principal | ICD-10-CM

## 2017-01-22 DIAGNOSIS — C50412 Malignant neoplasm of upper-outer quadrant of left female breast: Secondary | ICD-10-CM

## 2017-01-23 ENCOUNTER — Ambulatory Visit (HOSPITAL_BASED_OUTPATIENT_CLINIC_OR_DEPARTMENT_OTHER): Payer: BLUE CROSS/BLUE SHIELD

## 2017-01-23 ENCOUNTER — Other Ambulatory Visit (HOSPITAL_BASED_OUTPATIENT_CLINIC_OR_DEPARTMENT_OTHER): Payer: BLUE CROSS/BLUE SHIELD

## 2017-01-23 ENCOUNTER — Other Ambulatory Visit: Payer: Self-pay | Admitting: *Deleted

## 2017-01-23 ENCOUNTER — Ambulatory Visit: Payer: BLUE CROSS/BLUE SHIELD

## 2017-01-23 ENCOUNTER — Ambulatory Visit (HOSPITAL_BASED_OUTPATIENT_CLINIC_OR_DEPARTMENT_OTHER): Payer: BLUE CROSS/BLUE SHIELD | Admitting: Adult Health

## 2017-01-23 ENCOUNTER — Encounter: Payer: Self-pay | Admitting: *Deleted

## 2017-01-23 VITALS — BP 129/86 | HR 86 | Temp 97.7°F | Ht 64.0 in | Wt 216.4 lb

## 2017-01-23 VITALS — Resp 16

## 2017-01-23 DIAGNOSIS — C50412 Malignant neoplasm of upper-outer quadrant of left female breast: Secondary | ICD-10-CM

## 2017-01-23 DIAGNOSIS — Z170 Estrogen receptor positive status [ER+]: Principal | ICD-10-CM

## 2017-01-23 DIAGNOSIS — E876 Hypokalemia: Secondary | ICD-10-CM | POA: Diagnosis not present

## 2017-01-23 DIAGNOSIS — D701 Agranulocytosis secondary to cancer chemotherapy: Secondary | ICD-10-CM | POA: Diagnosis not present

## 2017-01-23 DIAGNOSIS — Z95828 Presence of other vascular implants and grafts: Secondary | ICD-10-CM

## 2017-01-23 DIAGNOSIS — Z5111 Encounter for antineoplastic chemotherapy: Secondary | ICD-10-CM | POA: Diagnosis not present

## 2017-01-23 DIAGNOSIS — Z5112 Encounter for antineoplastic immunotherapy: Secondary | ICD-10-CM

## 2017-01-23 LAB — COMPREHENSIVE METABOLIC PANEL
ALBUMIN: 3.8 g/dL (ref 3.5–5.0)
ALK PHOS: 69 U/L (ref 40–150)
ALT: 16 U/L (ref 0–55)
ANION GAP: 11 meq/L (ref 3–11)
AST: 16 U/L (ref 5–34)
BILIRUBIN TOTAL: 0.61 mg/dL (ref 0.20–1.20)
BUN: 12.3 mg/dL (ref 7.0–26.0)
CO2: 24 meq/L (ref 22–29)
CREATININE: 0.9 mg/dL (ref 0.6–1.1)
Calcium: 9.8 mg/dL (ref 8.4–10.4)
Chloride: 101 mEq/L (ref 98–109)
EGFR: 77 mL/min/{1.73_m2} — AB (ref >90)
Glucose: 100 mg/dl (ref 70–140)
Potassium: 4 mEq/L (ref 3.5–5.1)
Sodium: 136 mEq/L (ref 136–145)
TOTAL PROTEIN: 7.2 g/dL (ref 6.4–8.3)

## 2017-01-23 LAB — CBC WITH DIFFERENTIAL/PLATELET
BASO%: 0.1 % (ref 0.0–2.0)
Basophils Absolute: 0 10*3/uL (ref 0.0–0.1)
EOS%: 0.1 % (ref 0.0–7.0)
Eosinophils Absolute: 0 10*3/uL (ref 0.0–0.5)
HCT: 32.8 % — ABNORMAL LOW (ref 34.8–46.6)
HEMOGLOBIN: 10.8 g/dL — AB (ref 11.6–15.9)
LYMPH#: 1.2 10*3/uL (ref 0.9–3.3)
LYMPH%: 18.1 % (ref 14.0–49.7)
MCH: 31 pg (ref 25.1–34.0)
MCHC: 32.9 g/dL (ref 31.5–36.0)
MCV: 94.3 fL (ref 79.5–101.0)
MONO#: 0.4 10*3/uL (ref 0.1–0.9)
MONO%: 6.6 % (ref 0.0–14.0)
NEUT%: 75.1 % (ref 38.4–76.8)
NEUTROS ABS: 5 10*3/uL (ref 1.5–6.5)
PLATELETS: 193 10*3/uL (ref 145–400)
RBC: 3.48 10*6/uL — AB (ref 3.70–5.45)
RDW: 17.1 % — AB (ref 11.2–14.5)
WBC: 6.7 10*3/uL (ref 3.9–10.3)

## 2017-01-23 MED ORDER — ACETAMINOPHEN 325 MG PO TABS
650.0000 mg | ORAL_TABLET | Freq: Once | ORAL | Status: AC
  Administered 2017-01-23: 650 mg via ORAL

## 2017-01-23 MED ORDER — PALONOSETRON HCL INJECTION 0.25 MG/5ML
0.2500 mg | Freq: Once | INTRAVENOUS | Status: AC
Start: 2017-01-23 — End: 2017-01-23
  Administered 2017-01-23: 0.25 mg via INTRAVENOUS

## 2017-01-23 MED ORDER — SODIUM CHLORIDE 0.9% FLUSH
10.0000 mL | INTRAVENOUS | Status: DC | PRN
  Administered 2017-01-23: 10 mL via INTRAVENOUS
  Filled 2017-01-23: qty 10

## 2017-01-23 MED ORDER — PROCHLORPERAZINE MALEATE 10 MG PO TABS: 10.0000 mg | ORAL_TABLET | Freq: Four times a day (QID) | ORAL | 1 refills | Status: DC | PRN

## 2017-01-23 MED ORDER — HEPARIN SOD (PORK) LOCK FLUSH 100 UNIT/ML IV SOLN
500.0000 [IU] | Freq: Once | INTRAVENOUS | Status: AC | PRN
  Administered 2017-01-23: 500 [IU]
  Filled 2017-01-23: qty 5

## 2017-01-23 MED ORDER — SODIUM CHLORIDE 0.9 % IV SOLN
420.0000 mg | Freq: Once | INTRAVENOUS | Status: AC
  Administered 2017-01-23: 420 mg via INTRAVENOUS
  Filled 2017-01-23: qty 14

## 2017-01-23 MED ORDER — DIPHENHYDRAMINE HCL 25 MG PO CAPS
ORAL_CAPSULE | ORAL | Status: AC
  Filled 2017-01-23: qty 2

## 2017-01-23 MED ORDER — PALONOSETRON HCL INJECTION 0.25 MG/5ML
INTRAVENOUS | Status: AC
Start: 2017-01-23 — End: 2017-01-23
  Filled 2017-01-23: qty 5

## 2017-01-23 MED ORDER — SODIUM CHLORIDE 0.9% FLUSH
10.0000 mL | INTRAVENOUS | Status: DC | PRN
  Administered 2017-01-23: 10 mL
  Filled 2017-01-23: qty 10

## 2017-01-23 MED ORDER — SODIUM CHLORIDE 0.9 % IV SOLN
600.0000 mg | Freq: Once | INTRAVENOUS | Status: AC
  Administered 2017-01-23: 600 mg via INTRAVENOUS
  Filled 2017-01-23: qty 60

## 2017-01-23 MED ORDER — SODIUM CHLORIDE 0.9 % IV SOLN
Freq: Once | INTRAVENOUS | Status: AC
  Administered 2017-01-23: 11:00:00 via INTRAVENOUS
  Filled 2017-01-23: qty 5

## 2017-01-23 MED ORDER — SODIUM CHLORIDE 0.9 % IV SOLN
Freq: Once | INTRAVENOUS | Status: AC
  Administered 2017-01-23: 11:00:00 via INTRAVENOUS

## 2017-01-23 MED ORDER — DIPHENHYDRAMINE HCL 25 MG PO CAPS
50.0000 mg | ORAL_CAPSULE | Freq: Once | ORAL | Status: AC
Start: 2017-01-23 — End: 2017-01-23
  Administered 2017-01-23: 50 mg via ORAL

## 2017-01-23 MED ORDER — ONDANSETRON HCL 8 MG PO TABS: ORAL_TABLET | ORAL | 1 refills | Status: DC

## 2017-01-23 MED ORDER — ACETAMINOPHEN 325 MG PO TABS
ORAL_TABLET | ORAL | Status: AC
  Filled 2017-01-23: qty 2

## 2017-01-23 MED ORDER — SODIUM CHLORIDE 0.9 % IV SOLN
6.0000 mg/kg | Freq: Once | INTRAVENOUS | Status: AC
  Administered 2017-01-23: 630 mg via INTRAVENOUS
  Filled 2017-01-23: qty 30

## 2017-01-23 NOTE — Progress Notes (Signed)
[facility] Follow up:    Stacy Fenton, NP [place] [address]   DIAGNOSIS: Cancer Staging Malignant neoplasm of upper-outer quadrant of left female breast [facility]) Staging form: Breast, AJCC 7th Edition - Clinical: No stage assigned - Unsigned - Pathologic stage from 08/29/2016: Stage IIA (T2, N0, cM0) - Unsigned   SUMMARY OF ONCOLOGIC HISTORY:   Malignant neoplasm of upper-outer quadrant of left female breast ([place])   08/21/2016 Initial Diagnosis    Screening detected left breast mass UOQ at 1:00: 1.2 x 1 x 0.5 cm, axilla negative Left breast biopsy 1:00: IDC with DCIS, grade 1, ER 90%, PR 70%, Ki-67 30%, HER-2 positive ratio 2.43, T1c N0 stage IA clinical stage      09/06/2016 Surgery    Left lumpectomy: IDC grade 2, 3.4 cm, Margins Neg, 0/1 LN neg, ER 90%, PR 70%, Ki-67 30%, HER-2 positive ratio 2.43 T2N0 (stage 2 A)      10/09/2016 -  Chemotherapy    TCH Perjeta 6 cycles followed by Herceptin Perjeta maintenance for 1 year      12/20/2016 Genetic Testing    Genetic counseling and testing for hereditary cancer syndromes performed on 11/22/2016. Results are negative for pathogenic mutations in 46 genes analyzed by [facility]'s Common Hereditary Cancers Panel. Results are dated 12/20/2016. Genes tested: APC, ATM, AXIN2, BARD1, BMPR1A, BRCA1, BRCA2, BRIP1, CDH1, CDKN2A, CHEK2, CTNNA1, DICER1, EPCAM, GREM1, KIT, MEN1, MLH1, MSH2, MSH3, MSH6, MUTYH, NBN, NF1, NTHL1, PALB2, PDGFRA, PMS2, POLD1, POLE, PTEN, RAD50, RAD51C, RAD51D, SDHA, SDHB, SDHC, SDHD, SMAD4, SMARCA4, STK11, TP53, TSC1, TSC2, and VHL.  A variant of uncertain significance (not clinically actionable) was noted in the KIT gene called c.2601C>G (p.Ser867Arg). At this time, it is unknown if this variant is associated with increased cancer risk or if this is a normal finding. It should not be used to make medical management decisions.       CURRENT THERAPY: TCHP cycle 6 day  1  INTERVAL HISTORY: Stacy [name] 56 y.o. female returns for evaluation prior to receiving her sixth cycle of TCHP.  She is having a lot of tearing of her eyes.  She has not used any eye gtts as of yet.  She continues to have peripheral neuropathy.  This neuropathy has been present since cycle 3 and subsequent dose reductions of the Taxotere have been done.  She describes this as tingling in her feet and her fingertips.  It encompasses to the first DIP joint.  The tingling in her feet extends up into her lower calf.  She does have difficulty walking long distances.  She does have difficulty picking up change, and tends to drop things more.  She says the neuropathy is unchanged from prior.  She does have some aching in her bones.  Diarrhea continues to be manageable with loperamide and lomotil, and nausea is managed with her anti emetics.  She denies fevers, chills, or further concerns.     Patient Active Problem List   Diagnosis Date Noted  . Genetic testing 12/21/2016  . Simple partial seizure disorder ([place]) 10/18/2016  . Port catheter in place 10/09/2016  . GERD (gastroesophageal reflux disease) 10/09/2016  . Malignant neoplasm of upper-outer quadrant of left female breast ([place]) 08/23/2016  . Episodic altered awareness 08/01/2016  . Facial spasm 08/01/2016  . Right facial numbness 08/01/2016  . Depression 04/20/2016  . Arthritis 06/27/2015  . Insomnia 10/13/2009  . Essential hypertension 04/19/2009  . Fibromyalgia 02/18/2008  .  MITRAL VALVE PROLAPSE, HX OF 02/18/2008    is allergic to gabapentin; naproxen; neulasta [pegfilgrastim]; erythromycin; and tramadol hcl.  MEDICAL HISTORY: Past Medical History:  Diagnosis Date  . Anxiety   . Arthritis   . Complication of anesthesia    DIFFICULTY WAKING UP , LAST SURGERY NECK 2012 WAS FINE PER PT HERE AT [facility]  . Depression   . Fibromyalgia   . Genetic testing 12/21/2016   Genetic testing reported out on 12/20/2016 through [facility]'s 46-gene  Common Hereditary Cancers panel found no deleterious mutations. The following 46 genes were analyzed: APC, ATM, AXIN2, BARD1, BMPR1A, BRCA1, BRCA2, BRIP1, CDH1, CDKN2A, CHEK2, CTNNA1, DICER1, EPCAM, GREM1, KIT, MEN1, MLH1, MSH2, MSH3, MSH6, MUTYH, NBN, NF1, NTHL1, PALB2, PDGFRA, PMS2, POLD1, POLE, PTEN, RAD50, RAD51C, RAD51D, SDH  . Headache    HX MIGRAINES    . Heart murmur   . Hypertension   . Malignant neoplasm of upper-outer quadrant of left female breast ([place]) 08/23/2016  . MVP (mitral valve prolapse)    AS INFANT  . Neck pain   . Vision abnormalities     SURGICAL HISTORY: Past Surgical History:  Procedure Laterality Date  . cervical fusiion  2012  . JOINT REPLACEMENT    . MITRAL VALVE REPAIR     MVP 1970  . PORTACATH PLACEMENT Right 09/06/2016   Procedure: INSERTION PORT-A-CATH;  Surgeon: [name], MD;  Location: [place];  Service: General;  Laterality: Right;  . RADIOACTIVE SEED GUIDED MASTECTOMY WITH AXILLARY SENTINEL LYMPH NODE BIOPSY Left 09/06/2016   Procedure: RADIOACTIVE SEED GUIDED LEFT BREAST LUMPECTOMY  WITH SENTINEL LYMPH NODE BIOPSY;  Surgeon: [name], MD;  Location: [place];  Service: General;  Laterality: Left;  . ROTATOR CUFF REPAIR Right 2012  . TOTAL HIP ARTHROPLASTY Right 09/14/2015   Procedure: RIGHT TOTAL HIP ARTHROPLASTY ANTERIOR APPROACH;  Surgeon: Leandrew Koyanagi, MD;  Location: [place];  Service: Orthopedics;  Laterality: Right;  . TUBAL LIGATION  1988    SOCIAL HISTORY: Social History   Social History  . Marital status: Married    Spouse name: N/A  . Number of children: N/A  . Years of education: N/A   Occupational History  . Not on file.   Social History Main Topics  . Smoking status: Current Every Day Smoker    Packs/day: 0.25    Types: Cigarettes  . Smokeless tobacco: Never Used  . Alcohol use No  . Drug use: No  . Sexual activity: Yes    Birth control/ protection: Post-menopausal   Other  Topics Concern  . Not on file   Social History Narrative  . No narrative on file    FAMILY HISTORY: Family History  Problem Relation Age of Onset  . Adopted: Yes  . Breast cancer Brother 32    Maternal half-brother. Currently 42.    Review of Systems  Constitutional: Negative for appetite change, chills, fatigue, fever and unexpected weight change.  HENT:   Negative for hearing loss and lump/mass.   Eyes: Negative for eye problems and icterus.  Respiratory: Negative for chest tightness, cough and shortness of breath.   Cardiovascular: Negative for chest pain, leg swelling and palpitations.  Gastrointestinal: Negative for abdominal distention and abdominal pain.  Endocrine: Negative for hot flashes.  Skin: Negative for itching and rash.  Neurological: Positive for numbness. Negative for dizziness, headaches and light-headedness.  Hematological: Negative for adenopathy. Does not bruise/bleed easily.  Psychiatric/Behavioral: Negative for depression. The patient is not nervous/anxious.  PHYSICAL EXAMINATION  ECOG PERFORMANCE STATUS: 1 - Symptomatic but completely ambulatory  Vitals:   01/23/17 0934  BP: 129/86  Pulse: 86  Temp: 97.7 F (36.5 C)    Physical Exam  Constitutional: She is oriented to person, place, and time and well-developed, well-nourished, and in no distress.  HENT:  Head: Normocephalic and atraumatic.  Mouth/Throat: Oropharynx is clear and moist. No oropharyngeal exudate.  Eyes: Pupils are equal, round, and reactive to light. No scleral icterus.  Neck: Neck supple.  Cardiovascular: Normal rate, regular rhythm, normal heart sounds and intact distal pulses.  Exam reveals no gallop and no friction rub.   No murmur heard. Pulmonary/Chest: Effort normal and breath sounds normal. No respiratory distress. She has no wheezes.  Abdominal: Soft. Bowel sounds are normal. She exhibits no distension and no mass. There is no tenderness.  Musculoskeletal: She  exhibits no edema.  Lymphadenopathy:    She has no cervical adenopathy.  Neurological: She is alert and oriented to person, place, and time.  Skin: Skin is warm and dry. No rash noted.  Psychiatric: Mood and affect normal.    LABORATORY DATA:  CBC    Component Value Date/Time   WBC 6.7 01/23/2017 0818   WBC 2.3 (L) 11/09/2016 2307   RBC 3.48 (L) 01/23/2017 0818   RBC 3.55 (L) 11/09/2016 2307   HGB 10.8 (L) 01/23/2017 0818   HCT 32.8 (L) 01/23/2017 0818   PLT 193 01/23/2017 0818   MCV 94.3 01/23/2017 0818   MCH 31.0 01/23/2017 0818   MCH 29.6 11/09/2016 2307   MCHC 32.9 01/23/2017 0818   MCHC 34.9 11/09/2016 2307   RDW 17.1 (H) 01/23/2017 0818   LYMPHSABS 1.2 01/23/2017 0818   MONOABS 0.4 01/23/2017 0818   EOSABS 0.0 01/23/2017 0818   BASOSABS 0.0 01/23/2017 0818    CMP     Component Value Date/Time   NA 136 01/23/2017 0819   K 4.0 01/23/2017 0819   CL 105 11/09/2016 2307   CO2 24 01/23/2017 0819   GLUCOSE 100 01/23/2017 0819   BUN 12.3 01/23/2017 0819   CREATININE 0.9 01/23/2017 0819   CALCIUM 9.8 01/23/2017 0819   PROT 7.2 01/23/2017 0819   ALBUMIN 3.8 01/23/2017 0819   AST 16 01/23/2017 0819   ALT 16 01/23/2017 0819   ALKPHOS 69 01/23/2017 0819   BILITOT 0.61 01/23/2017 0819   GFRNONAA 58 (L) 11/09/2016 2307   GFRAA >60 11/09/2016 2307    ASSESSMENT and PLAN:   Malignant neoplasm of upper-outer quadrant of left female breast ([place]) Left lumpectomy 09/06/16: IDC grade 2, 3.4 cm, Margins Neg, 0/1 LN neg, T2N0 ER 90%, PR 70%, Ki-67 30%, HER-2 positive ratio 2.43 (stage 2 A)  Treatment Plan: 1. adjuvant chemotherapy TCHPfollowed by Herceptin-Perjetamaintenance to complete 1 year. 2. Adjuvant radiation therapy followed by 3. Adjuvant antiestrogen therapy ----------------------------------------------------------------------------- Current Treatment: TCHP cycle 5 Chemo Toxicities: 1. Severe cough: Could beacid reflux. Onprotonix. 2. nausea and  vomiting: Due to chemotherapy, Wereducedthe dosage of Cycle 2 of chemotherapy and further reduced on cycle 4. 3. Allergy to Neulasta: We discontinued Neulasta  4. Profound lack of taste and discomfort in the mouth 5. Generalized fatigue 6. Neutropenia with slight fevers after cycle 2: No evidence of infection but patient not treated with fluids, IV antibiotics 7. Diarrhea with hypokalemia: Required IV fluids and potassium replacement, now on Imodium and Lomotil   All questions were answered. The patient knows to call the clinic with any problems, questions or concerns. We can  certainly see the patient much sooner if necessary.  A total of (30) minutes of face-to-face time was spent with this patient with greater than 50% of that time in counseling and care-coordination.   Gardenia Phlegm, NP 01/23/2017   Attending Note  I personally saw and examined Stacy [name]. The plan of care was discussed with her. I agree with the assessment and plan as documented above. Based on grade 2-3 neuropathy especially in the legs where it is numb up front of the ankle on both sides, I recommended discontinuation of Taxotere. I discussed with her that the risks far outweigh any benefits of continuing with the Taxotere. We also discussed that the neuropathy can continue to get worse even after the treatment is complete. She will get an appointment with radiation oncology. I will follow her through adjuvant Herceptin and Perjeta treatment. Signed Rulon Eisenmenger, MD

## 2017-01-23 NOTE — Progress Notes (Signed)
Per Tammi RN, per Charlestine Massed, NP, No Taxotere today

## 2017-01-23 NOTE — Patient Instructions (Signed)
[facility] Discharge Instructions for Patients Receiving Chemotherapy  Today you received the following chemotherapy agents Herceptin, Perjeta and Carboplatin   To help prevent nausea and vomiting after your treatment, we encourage you to take your nausea medication as directed.    If you develop nausea and vomiting that is not controlled by your nausea medication, call the clinic.   BELOW ARE SYMPTOMS THAT SHOULD BE REPORTED IMMEDIATELY:  *FEVER GREATER THAN 100.5 F  *CHILLS WITH OR WITHOUT FEVER  NAUSEA AND VOMITING THAT IS NOT CONTROLLED WITH YOUR NAUSEA MEDICATION  *UNUSUAL SHORTNESS OF BREATH  *UNUSUAL BRUISING OR BLEEDING  TENDERNESS IN MOUTH AND THROAT WITH OR WITHOUT PRESENCE OF ULCERS  *URINARY PROBLEMS  *BOWEL PROBLEMS  UNUSUAL RASH Items with * indicate a potential emergency and should be followed up as soon as possible.  Feel free to call the clinic you have any questions or concerns. The clinic phone number is [phone].  Please show the [facility] at check-in to the Emergency Department and triage nurse.

## 2017-01-23 NOTE — Assessment & Plan Note (Addendum)
Left lumpectomy 09/06/16: IDC grade 2, 3.4 cm, Margins Neg, 0/1 LN neg, T2N0 ER 90%, PR 70%, Ki-67 30%, HER-2 positive ratio 2.43 (stage 2 A)  Treatment Plan: 1. adjuvant chemotherapy TCHPfollowed by Herceptin-Perjetamaintenance to complete 1 year. 2. Adjuvant radiation therapy followed by 3. Adjuvant antiestrogen therapy ----------------------------------------------------------------------------- Current Treatment: TCHP cycle 6 Chemo Toxicities: 1. Severe cough: Could beacid reflux. Onprotonix. 2. nausea and vomiting: Due to chemotherapy, Dosage reduced of Cycle 2 of chemotherapy and further reduced on cycle 4. 3. Allergy to Neulasta: Neulasta has been discontinued  4. Profound lack of taste and discomfort in the mouth 5. Generalized fatigue 6. Neutropenia with slight fevers after cycle 2: No evidence of infection but patient not treated with fluids, IV antibiotics 7. Diarrhea with hypokalemia: Required IV fluids and potassium replacement, now on Imodium and Lomotil 8. Peripheral neuropathy: Taxotere will not be administered with cycle 6 9. Tearing of eyes: recommended systane eye gtts four times a day, if worsens or continues would recommend eval to ophthalmology to r/o lacrimal duct stenosis  Requested re-consult with Dr. Sondra Come for eval for radiation, patient will return in 3 weeks for Hercpetin/Perjeta, and in 6 weeks for labs, evaluation by NP, and Herceptin/Perjeta.  She has repeat echo and cardio-oncology appointment scheduled on 03/06/2017.

## 2017-01-24 DIAGNOSIS — R110 Nausea: Secondary | ICD-10-CM | POA: Diagnosis not present

## 2017-01-24 NOTE — Progress Notes (Signed)
Location of Breast Cancer: upper-outer quadrant of left breast   Histology per Pathology Report:   09/06/16 Diagnosis 1. Breast, lumpectomy, Left - INVASIVE DUCTAL CARCINOMA, GRADE I/III, SPANNING 3.4 CM. - DUCTAL CARCINOMA IN SITU, INTERMEDIATE GRADE. - INVASIVE CARCINOMA IS BROADLY PRESENT AT THE LATERAL MARGIN OF SPECIMEN #1. - DUCTAL CARCINOMA IN SITU IS BROADLY LESS THAN 0.1 CM TO THE LATERAL MARGIN OF SPECIMEN #1. - SEE ONCOLOGY TABLE BELOW. 2. Lymph node, sentinel, biopsy, Left axilla #1 - THERE IS NO EVIDENCE OF CARCINOMA IN 1 OF 1 LYMPH NODE (0/1). 3. Breast, excision, Left additional lateral margin - FIBROCYSTIC CHANGES WITH ADENOSIS AND CALCIFICATIONS. - THERE IS NO EVIDENCE OF MALIGNANCY. - SEE COMMENT. 4. Breast, excision, Left additional anterior margin - FIBROCYSTIC CHANGES WITH ADENOSIS AND CALCIFICATIONS. - FIBROADENOMA. - THERE IS NO EVIDENCE OF MALIGNANCY. - SEE COMMENT. 5. Breast, excision, Left additional medial margin - FIBROCYSTIC CHANGES WITH ADENOSIS AND CALCIFICATIONS. - RADIAL SCAR. - THERE IS NO EVIDENCE OF MALIGNANCY. - SEE COMMENT. 6. Lymph node, sentinel, biopsy, Left axilla #2 - THERE IS NO EVIDENCE OF CARCINOMA OF 1 OF 1 LYMPH NODE (0/1).  08/21/16 Diagnosis Breast, left, needle core biopsy, 1 o'clock - INVASIVE DUCTAL CARCINOMA. - DUCTAL CARCINOMA IN SITU. - SEE COMMENT.  Receptor Status: ER(90%), PR (70%), Her2-neu (postive), Ki-(30%)  Did patient present with symptoms (if so, please note symptoms) or was this found on screening mammography?: screening mammogram  Past/Anticipated interventions by surgeon, if any: 09/06/16 - Procedure: RADIOACTIVE SEED GUIDED LEFT BREAST LUMPECTOMY  WITH SENTINEL LYMPH NODE BIOPSY;  Surgeon: [name], MD  Past/Anticipated interventions by medical oncology, if any: adjuvant chemotherapy TCHPfollowed by Herceptin-Perjetamaintenance to complete 1 year. Taxotere stopped 01/23/17 due to  neuropathy.  Lymphedema issues, if any:  no   Pain issues, if any:  no   OB Gyn history: She menarched at early age of 50 and is perimenopausal. She had 4 pregnancies, her first child was born at age 9. She has not received birth control pills. She was never exposed to fertility medications or hormone replacement therapy.   SAFETY ISSUES:  Prior radiation? no  Pacemaker/ICD? no  Possible current pregnancy?no  Is the patient on methotrexate? no  Current Complaints / other details:  Patient reports having a cough and congestion since last Thursday.    BP 119/67 (BP Location: Right Arm, Patient Position: Sitting)   Pulse 78   Temp 97.7 F (36.5 C) (Oral)   Ht _0  (1.626 m)   Wt 215 lb (97.5 kg)   SpO2 98%   BMI 36.90 kg/m     Wt Readings from Last 3 Encounters:  01/31/17 215 lb (97.5 kg)  01/28/17 200 lb (90.7 kg)  01/27/17 200 lb (90.7 kg)      Delpha Perko, Craige Cotta, RN 01/24/2017,4:17 PM

## 2017-01-27 ENCOUNTER — Emergency Department (HOSPITAL_COMMUNITY): Payer: BLUE CROSS/BLUE SHIELD

## 2017-01-27 ENCOUNTER — Encounter (HOSPITAL_COMMUNITY): Payer: Self-pay | Admitting: Emergency Medicine

## 2017-01-27 ENCOUNTER — Emergency Department (HOSPITAL_COMMUNITY)
Admission: EM | Admit: 2017-01-27 | Discharge: 2017-01-28 | Disposition: A | Discharge status: Home / Self Care | Payer: BLUE CROSS/BLUE SHIELD | Attending: Emergency Medicine | Admitting: Emergency Medicine

## 2017-01-27 DIAGNOSIS — I10 Essential (primary) hypertension: Secondary | ICD-10-CM | POA: Diagnosis not present

## 2017-01-27 DIAGNOSIS — R110 Nausea: Secondary | ICD-10-CM | POA: Diagnosis not present

## 2017-01-27 DIAGNOSIS — Z79899 Other long term (current) drug therapy: Secondary | ICD-10-CM | POA: Diagnosis not present

## 2017-01-27 DIAGNOSIS — Z853 Personal history of malignant neoplasm of breast: Secondary | ICD-10-CM | POA: Diagnosis not present

## 2017-01-27 DIAGNOSIS — Z96641 Presence of right artificial hip joint: Secondary | ICD-10-CM | POA: Diagnosis not present

## 2017-01-27 DIAGNOSIS — Z87891 Personal history of nicotine dependence: Secondary | ICD-10-CM | POA: Insufficient documentation

## 2017-01-27 DIAGNOSIS — R1114 Bilious vomiting: Secondary | ICD-10-CM | POA: Diagnosis not present

## 2017-01-27 DIAGNOSIS — R05 Cough: Secondary | ICD-10-CM | POA: Diagnosis not present

## 2017-01-27 DIAGNOSIS — R52 Pain, unspecified: Secondary | ICD-10-CM

## 2017-01-27 LAB — URINALYSIS, ROUTINE W REFLEX MICROSCOPIC
BILIRUBIN URINE: NEGATIVE
Glucose, UA: NEGATIVE mg/dL
KETONES UR: NEGATIVE mg/dL
Nitrite: NEGATIVE
PH: 6 (ref 5.0–8.0)
Protein, ur: NEGATIVE mg/dL
Specific Gravity, Urine: 1.011 (ref 1.005–1.030)

## 2017-01-27 LAB — CBC WITH DIFFERENTIAL/PLATELET
BASOS ABS: 0 10*3/uL (ref 0.0–0.1)
Basophils Relative: 0 %
EOS ABS: 0.2 10*3/uL (ref 0.0–0.7)
EOS PCT: 2 %
HCT: 32.7 % — ABNORMAL LOW (ref 36.0–46.0)
Hemoglobin: 11.4 g/dL — ABNORMAL LOW (ref 12.0–15.0)
LYMPHS PCT: 15 %
Lymphs Abs: 1.1 10*3/uL (ref 0.7–4.0)
MCH: 31.8 pg (ref 26.0–34.0)
MCHC: 34.9 g/dL (ref 30.0–36.0)
MCV: 91.1 fL (ref 78.0–100.0)
Monocytes Absolute: 0.8 10*3/uL (ref 0.1–1.0)
Monocytes Relative: 10 %
Neutro Abs: 5.6 10*3/uL (ref 1.7–7.7)
Neutrophils Relative %: 73 %
PLATELETS: 241 10*3/uL (ref 150–400)
RBC: 3.59 MIL/uL — AB (ref 3.87–5.11)
RDW: 16.1 % — ABNORMAL HIGH (ref 11.5–15.5)
WBC: 7.7 10*3/uL (ref 4.0–10.5)

## 2017-01-27 LAB — COMPREHENSIVE METABOLIC PANEL
ALT: 15 U/L (ref 14–54)
AST: 19 U/L (ref 15–41)
Albumin: 4.2 g/dL (ref 3.5–5.0)
Alkaline Phosphatase: 65 U/L (ref 38–126)
Anion gap: 14 (ref 5–15)
BUN: 18 mg/dL (ref 6–20)
CALCIUM: 9.4 mg/dL (ref 8.9–10.3)
CHLORIDE: 97 mmol/L — AB (ref 101–111)
CO2: 23 mmol/L (ref 22–32)
Creatinine, Ser: 0.96 mg/dL (ref 0.44–1.00)
Glucose, Bld: 129 mg/dL — ABNORMAL HIGH (ref 65–99)
Potassium: 2.9 mmol/L — ABNORMAL LOW (ref 3.5–5.1)
Sodium: 134 mmol/L — ABNORMAL LOW (ref 135–145)
Total Bilirubin: 0.8 mg/dL (ref 0.3–1.2)
Total Protein: 8 g/dL (ref 6.5–8.1)

## 2017-01-27 LAB — I-STAT CG4 LACTIC ACID, ED: LACTIC ACID, VENOUS: 1.17 mmol/L (ref 0.5–1.9)

## 2017-01-27 MED ORDER — HALOPERIDOL LACTATE 5 MG/ML IJ SOLN
5.0000 mg | Freq: Once | INTRAMUSCULAR | Status: AC
  Administered 2017-01-27: 5 mg via INTRAVENOUS
  Filled 2017-01-27: qty 1

## 2017-01-27 MED ORDER — SODIUM CHLORIDE 0.9 % IV BOLUS (SEPSIS)
1000.0000 mL | Freq: Once | INTRAVENOUS | Status: AC
  Administered 2017-01-27: 1000 mL via INTRAVENOUS

## 2017-01-27 MED ORDER — PROMETHAZINE HCL 25 MG/ML IJ SOLN
25.0000 mg | Freq: Once | INTRAMUSCULAR | Status: AC
  Administered 2017-01-27: 25 mg via INTRAVENOUS
  Filled 2017-01-27: qty 1

## 2017-01-27 NOTE — ED Notes (Signed)
Patient transported to X-ray 

## 2017-01-27 NOTE — ED Triage Notes (Signed)
Pt from home with complaints of fever at home, generalized body aches, and congestion with yellow sputum since he last chemo treatment on Thursday. Pt is currently being treated for breast cancer. Pt was not febrile nor tachycardic at time of assessment

## 2017-01-27 NOTE — ED Notes (Signed)
ED Provider at bedside. 

## 2017-01-27 NOTE — ED Notes (Signed)
Pt tried for UA in the restroom and was unsuccessful

## 2017-01-27 NOTE — ED Provider Notes (Signed)
[facility] DEPT Provider Note   CSN: [id number] Arrival date & time: 01/27/17  1821     History   Chief Complaint Chief Complaint  Patient presents with  . Nasal Congestion  . Generalized Body Aches  . cancer patient    HPI Stacy [name] is a 56 y.o. female.  HPI Patient presents with multiple concerns. She notes that over the past 4 days she has had persistent nausea, generalized pain, as well as cough, congestion, dyspnea, sinus congestion, generalized weakness, subjective fever. Notably, the patient received chemotherapy the day prior to the onset of symptoms. This was the last planned session of chemotherapy, and the patient is scheduled to begin radiation therapy in 2 weeks. Patient is undergoing therapy for breast cancer Since this batch of symptoms began, the patient has been unable to tolerate much oral intake, due to persistent nausea. 1 dose of Compazine did not change her symptoms appreciably.  Past Medical History:  Diagnosis Date  . Anxiety   . Arthritis   . Complication of anesthesia    DIFFICULTY WAKING UP , LAST SURGERY NECK 2012 WAS FINE PER PT HERE AT [facility]  . Depression   . Fibromyalgia   . Genetic testing 12/21/2016   Genetic testing reported out on 12/20/2016 through [facility]'s 46-gene Common Hereditary Cancers panel found no deleterious mutations. The following 46 genes were analyzed: APC, ATM, AXIN2, BARD1, BMPR1A, BRCA1, BRCA2, BRIP1, CDH1, CDKN2A, CHEK2, CTNNA1, DICER1, EPCAM, GREM1, KIT, MEN1, MLH1, MSH2, MSH3, MSH6, MUTYH, NBN, NF1, NTHL1, PALB2, PDGFRA, PMS2, POLD1, POLE, PTEN, RAD50, RAD51C, RAD51D, SDH  . Headache    HX MIGRAINES    . Heart murmur   . Hypertension   . Malignant neoplasm of upper-outer quadrant of left female breast ([place]) 08/23/2016  . MVP (mitral valve prolapse)    AS INFANT  . Neck pain   . Vision abnormalities     Patient Active Problem List   Diagnosis Date Noted  . Genetic testing 12/21/2016  . Simple partial seizure  disorder ([place]) 10/18/2016  . Port catheter in place 10/09/2016  . GERD (gastroesophageal reflux disease) 10/09/2016  . Malignant neoplasm of upper-outer quadrant of left female breast ([place]) 08/23/2016  . Episodic altered awareness 08/01/2016  . Facial spasm 08/01/2016  . Right facial numbness 08/01/2016  . Depression 04/20/2016  . Arthritis 06/27/2015  . Insomnia 10/13/2009  . Essential hypertension 04/19/2009  . Fibromyalgia 02/18/2008  . MITRAL VALVE PROLAPSE, HX OF 02/18/2008    Past Surgical History:  Procedure Laterality Date  . cervical fusiion  2012  . JOINT REPLACEMENT    . MITRAL VALVE REPAIR     MVP 1970  . PORTACATH PLACEMENT Right 09/06/2016   Procedure: INSERTION PORT-A-CATH;  Surgeon: [name], MD;  Location: [facility];  Service: General;  Laterality: Right;  . RADIOACTIVE SEED GUIDED MASTECTOMY WITH AXILLARY SENTINEL LYMPH NODE BIOPSY Left 09/06/2016   Procedure: RADIOACTIVE SEED GUIDED LEFT BREAST LUMPECTOMY  WITH SENTINEL LYMPH NODE BIOPSY;  Surgeon: [name], MD;  Location: [facility];  Service: General;  Laterality: Left;  . ROTATOR CUFF REPAIR Right 2012  . TOTAL HIP ARTHROPLASTY Right 09/14/2015   Procedure: RIGHT TOTAL HIP ARTHROPLASTY ANTERIOR APPROACH;  Surgeon: Leandrew Koyanagi, MD;  Location: [facility];  Service: Orthopedics;  Laterality: Right;  . TUBAL LIGATION  1988    OB History    No data available       Home Medications    Prior to Admission medications  Medication Sig Start Date End Date Taking? Authorizing Provider  dexamethasone (DECADRON) 4 MG tablet Take 1 tablet (4 mg total) by mouth daily. Take 1 tablet daily before chemotherapy and 1 tablet the day after with food. 09/26/16   Nicholas Lose, MD  hydrochlorothiazide (HYDRODIURIL) 25 MG tablet TAKE 1 TABLET BY MOUTH EVERY DAY 04/27/16   [name], NP  ibuprofen (ADVIL,MOTRIN) 200 MG tablet Take 400 mg by mouth every 4 (four) hours as needed for  headache, mild pain or moderate pain.     [provider]  lidocaine-prilocaine (EMLA) cream Apply to affected area once 09/26/16   Nicholas Lose, MD  LORazepam (ATIVAN) 0.5 MG tablet Take 1 tablet (0.5 mg total) by mouth every 6 (six) hours as needed (Nausea or vomiting). 09/26/16   Nicholas Lose, MD  ondansetron (ZOFRAN) 8 MG tablet TAKE 1 TABLET BY MOUTH TWICE DAILY AS NEEDED FOR REFRACTORY NAUSEA AND VOMITING. START ON DAY 3 AFTER CHEMO 01/23/17   Gardenia Phlegm, NP  pantoprazole (PROTONIX) 40 MG tablet Take 1 tablet (40 mg total) by mouth daily. 10/16/16   Nicholas Lose, MD  prochlorperazine (COMPAZINE) 10 MG tablet Take 1 tablet (10 mg total) by mouth every 6 (six) hours as needed (Nausea or vomiting). 01/23/17   Gardenia Phlegm, NP    Family History Family History  Problem Relation Age of Onset  . Adopted: Yes  . Breast cancer Brother 78       Maternal half-brother. Currently 66.    Social History Social History  Substance Use Topics  . Smoking status: Former Smoker    Packs/day: 0.25    Types: Cigarettes    Quit date: 01/13/2017  . Smokeless tobacco: Never Used  . Alcohol use No     Allergies   Gabapentin; Naproxen; Neulasta [pegfilgrastim]; Erythromycin; and Tramadol hcl   Review of Systems Review of Systems  Constitutional:       Per HPI, otherwise negative  HENT:       Per HPI, otherwise negative  Respiratory:       Per HPI, otherwise negative  Cardiovascular:       Per HPI, otherwise negative  Gastrointestinal: Positive for nausea. Negative for vomiting.  Endocrine:       Negative aside from HPI  Genitourinary:       Neg aside from HPI   Musculoskeletal:       Per HPI, otherwise negative  Skin: Negative.  Negative for rash.  Allergic/Immunologic: Positive for immunocompromised state.  Neurological: Positive for weakness. Negative for syncope.     Physical Exam Updated Vital Signs BP 123/86 (BP Location: Left Arm)   Pulse 99    Temp 98.5 F (36.9 C) (Oral)   Resp 20   Ht _0  (1.626 m)   Wt 200 lb (90.7 kg)   LMP 08/29/2016   SpO2 96%   BMI 34.33 kg/m   Physical Exam  Constitutional: She is oriented to person, place, and time. She appears well-developed and well-nourished. No distress.  Uncomfortable appearing obese female resting in right lateral decubitus position awake, alert, speaking clearly, but tearful.  HENT:  Head: Normocephalic and atraumatic.  Eyes: Conjunctivae and EOM are normal.  Cardiovascular: Normal rate and regular rhythm.   Pulmonary/Chest: Effort normal and breath sounds normal. No stridor. No respiratory distress.  Abdominal: She exhibits no distension.  Musculoskeletal: She exhibits no edema.  Neurological: She is alert and oriented to person, place, and time. No cranial nerve deficit.  Skin:  Skin is warm and dry.  Psychiatric: She has a normal mood and affect.  Nursing note and vitals reviewed.  Chart review notable for ongoing difficulties with chemotherapy, as documented by the patient's nurse practitioner. Patient has recently had to stop Neulasta therapy secondary to presumed complication.   ED Treatments / Results  Labs (all labs ordered are listed, but only abnormal results are displayed) Labs Reviewed  COMPREHENSIVE METABOLIC PANEL - Abnormal; Notable for the following:       Result Value   Sodium 134 (*)    Potassium 2.9 (*)    Chloride 97 (*)    Glucose, Bld 129 (*)    All other components within normal limits  CBC WITH DIFFERENTIAL/PLATELET - Abnormal; Notable for the following:    RBC 3.59 (*)    Hemoglobin 11.4 (*)    HCT 32.7 (*)    RDW 16.1 (*)    All other components within normal limits  URINALYSIS, ROUTINE W REFLEX MICROSCOPIC  I-STAT CG4 LACTIC ACID, ED     Radiology Dg Chest 2 View  Result Date: 01/27/2017 CLINICAL DATA:  Cough, body aches, chills, status post chemotherapy for left breast cancer EXAM: CHEST  2 VIEW COMPARISON:  11/09/2016  FINDINGS: Lungs are clear.  No pleural effusion or pneumothorax. The heart is normal in size. Right chest port terminates in the lower SVC. Surgical clips in the left breast/axilla. Cervical spine fixation hardware.  Median sternotomy. IMPRESSION: No evidence of acute cardiopulmonary disease. Electronically Signed   By: Julian Hy M.D.   On: 01/27/2017 19:08    Procedures Procedures (including critical care time)  Medications Ordered in ED Medications  sodium chloride 0.9 % bolus 1,000 mL (0 mLs Intravenous Stopped 01/27/17 2102)  promethazine (PHENERGAN) injection 25 mg (25 mg Intravenous Given 01/27/17 1939)  haloperidol lactate (HALDOL) injection 5 mg (5 mg Intravenous Given 01/27/17 2233)     Initial Impression / Assessment and Plan / ED Course  I have reviewed the triage vital signs and the nursing notes.  Pertinent labs & imaging results that were available during my care of the patient were reviewed by me and considered in my medical decision making (see chart for details).  10:10 PM On repeat exam the patient is vomiting, copiously. Patient has received Compazine, Phenergan, with no substantial change, she will receive Haldol. Initial labs reassuring.  12:20 AM Vomiting has subsided, patient states that she is ready to go home, will follow-up with her oncologist. I reinforced the importance of this, and I will the patient has minor electrolyte abnormalities, she has improved, is awake and alert, with no evidence for sustained illness, and symptoms are likely due to her recent chemotherapy, on his cancer treatments.   Final Clinical Impressions(s) / ED Diagnoses   Final diagnoses:  Pain  Bilious vomiting with nausea     Carmin Muskrat, MD 01/28/17 0020

## 2017-01-28 ENCOUNTER — Telehealth: Payer: Self-pay | Admitting: Internal Medicine

## 2017-01-28 ENCOUNTER — Emergency Department (HOSPITAL_COMMUNITY)
Admission: EM | Admit: 2017-01-28 | Discharge: 2017-01-28 | Disposition: A | Discharge status: Home / Self Care | Payer: BLUE CROSS/BLUE SHIELD | Attending: Emergency Medicine | Admitting: Emergency Medicine

## 2017-01-28 ENCOUNTER — Encounter (HOSPITAL_COMMUNITY): Payer: Self-pay

## 2017-01-28 DIAGNOSIS — Z5321 Procedure and treatment not carried out due to patient leaving prior to being seen by health care provider: Secondary | ICD-10-CM | POA: Insufficient documentation

## 2017-01-28 DIAGNOSIS — R0602 Shortness of breath: Secondary | ICD-10-CM | POA: Insufficient documentation

## 2017-01-28 NOTE — ED Notes (Signed)
CALLED X2 WITH NO RESPONSE.

## 2017-01-28 NOTE — ED Triage Notes (Signed)
Pt is coming from home with complaints of SOB. Pt's lungs are clear upon assessment. Report productive cough with green sputum. Was seen yesterday and Xray was clear.

## 2017-01-28 NOTE — Discharge Instructions (Signed)
As discussed, it is important that you follow up as soon as possible with your physician for continued management of your condition. ° °If you develop any new, or concerning changes in your condition, please return to the emergency department immediately. ° °

## 2017-01-28 NOTE — ED Notes (Signed)
Pt. IV removed and catheter intact.

## 2017-01-28 NOTE — Telephone Encounter (Signed)
Per chart review tab pt went to [facility]. 

## 2017-01-28 NOTE — Telephone Encounter (Signed)
[facility]  Patient Name: Stacy [name]  DOB: February 20, 1961    Initial Comment Caller states she is having trouble breathing.   Nurse Assessment  Nurse: Hardin Negus, RN, Estill Bamberg Date/Time (Eastern Time): 01/28/2017 11:22:44 AM  Confirm and document reason for call. If symptomatic, describe symptoms. ---Caller states she is having trouble breathing starting today. Caller states she has had congestion and cough since Thursday. Denies other symptoms.  Does the patient have any new or worsening symptoms? ---Yes  Will a triage be completed? ---Yes  Related visit to physician within the last 2 weeks? ---No  Does the PT have any chronic conditions? (i.e. diabetes, asthma, etc.) ---Yes  List chronic conditions. ---Chemo  Is the patient pregnant or possibly pregnant? (Ask all females between the ages of 74-55) ---No  Is this a behavioral health or substance abuse call? ---No     Guidelines    Guideline Title Affirmed Question Affirmed Notes  Breathing Difficulty [1] MODERATE difficulty breathing (e.g., speaks in phrases, SOB even at rest, pulse 100-120) AND [2] NEW-onset or WORSE than normal    Final Disposition User   Go to ED Now Hardin Negus, RN, [facility] - ED   Disagree/Comply: Comply

## 2017-01-28 NOTE — ED Notes (Signed)
Patient called x 1

## 2017-01-28 NOTE — ED Notes (Signed)
CALLED X3 WITH NO RESPONSE

## 2017-01-29 ENCOUNTER — Other Ambulatory Visit: Payer: Self-pay | Admitting: Emergency Medicine

## 2017-01-29 DIAGNOSIS — C50412 Malignant neoplasm of upper-outer quadrant of left female breast: Secondary | ICD-10-CM

## 2017-01-29 DIAGNOSIS — Z170 Estrogen receptor positive status [ER+]: Principal | ICD-10-CM

## 2017-01-29 MED ORDER — LORAZEPAM 0.5 MG PO TABS: 0.5000 mg | ORAL_TABLET | Freq: Four times a day (QID) | ORAL | 1 refills | Status: DC | PRN

## 2017-01-31 ENCOUNTER — Encounter: Payer: Self-pay | Admitting: Radiation Oncology

## 2017-01-31 ENCOUNTER — Ambulatory Visit
Admission: RE | Admit: 2017-01-31 | Discharge: 2017-01-31 | Disposition: A | Discharge status: Home / Self Care | Payer: BLUE CROSS/BLUE SHIELD | Source: Ambulatory Visit | Attending: Radiation Oncology | Admitting: Radiation Oncology

## 2017-01-31 VITALS — BP 119/67 | HR 78 | Temp 97.7°F | Ht 64.0 in | Wt 215.0 lb

## 2017-01-31 DIAGNOSIS — Z9221 Personal history of antineoplastic chemotherapy: Secondary | ICD-10-CM | POA: Diagnosis not present

## 2017-01-31 DIAGNOSIS — Z510 Encounter for antineoplastic radiation therapy: Secondary | ICD-10-CM | POA: Diagnosis not present

## 2017-01-31 DIAGNOSIS — Z9889 Other specified postprocedural states: Secondary | ICD-10-CM | POA: Diagnosis not present

## 2017-01-31 DIAGNOSIS — R200 Anesthesia of skin: Secondary | ICD-10-CM | POA: Diagnosis not present

## 2017-01-31 DIAGNOSIS — Z170 Estrogen receptor positive status [ER+]: Principal | ICD-10-CM

## 2017-01-31 DIAGNOSIS — C50412 Malignant neoplasm of upper-outer quadrant of left female breast: Secondary | ICD-10-CM

## 2017-01-31 DIAGNOSIS — G629 Polyneuropathy, unspecified: Secondary | ICD-10-CM | POA: Insufficient documentation

## 2017-01-31 DIAGNOSIS — Z888 Allergy status to other drugs, medicaments and biological substances status: Secondary | ICD-10-CM | POA: Insufficient documentation

## 2017-01-31 DIAGNOSIS — Z79899 Other long term (current) drug therapy: Secondary | ICD-10-CM | POA: Insufficient documentation

## 2017-01-31 DIAGNOSIS — R05 Cough: Secondary | ICD-10-CM | POA: Insufficient documentation

## 2017-01-31 NOTE — Progress Notes (Signed)
Please see the Nurse Progress Note in the MD Initial Consult Encounter for this patient. 

## 2017-01-31 NOTE — Progress Notes (Signed)
Radiation Oncology         [phone]) [phone] ________________________________  Name: Stacy [name] MRN: [id number]  Date: 01/31/2017  DOB: 1961-03-05  Re-Evaluation Note  CC: [name], NP  Nicholas Lose, MD    ICD-9-CM ICD-10-CM   1. Malignant neoplasm of upper-outer quadrant of left breast in female, estrogen receptor positive ([place]) 174.4 C50.412    V86.0 Z17.0     Diagnosis:  Pathological Stage II-A Left Breast UOQ Invasive Ductal Carcinoma (T2, N0), ER+ / PR+ / Her2+, Grade 2  Narrative:  The patient returns today for re-evaluation following breast clinic on 08/29/16. Patient has since undergone a left breast lumpectomy with sentinel lymph node biopsy on 09/06/16. Pathology revealed invasive ductal carcinoma, grade I/III spanning 3.4 cm with DCIS intermediate grade. There was invasive carcinoma broadly present at the lateral margin and DCIS broadly less than 0.1 cm to the lateral margin. Biopsy of 2 lymph nodes were negative for malignancy (0/2). Excision of the left additional medial margin, anterior margin, and lateral margin had no evidence of malignancy and fibrocystic changes with adenosis and calcifications.  Patient was started on [place] x 6 cycles followed by Herceptin Perjeta maintenance for 1 year on 10/09/16. Taxotere was stopped on 01/23/17 due to neuropathy. Results from genetic testing on 12/20/16 are negative for pathogenic mutations in 46 genes. Patient presented to the emergency department on 01/27/17 for cough and chills. X-ray of the chest showed no evidence of acute cardiopulmonary disease.    Malignant neoplasm of upper-outer quadrant of left female breast ([place])   08/21/2016 Initial Diagnosis    Screening detected left breast mass UOQ at 1:00: 1.2 x 1 x 0.5 cm, axilla negative Left breast biopsy 1:00: IDC with DCIS, grade 1, ER 90%, PR 70%, Ki-67 30%, HER-2 positive ratio 2.43, T1c N0 stage IA clinical stage      09/06/2016 Surgery    Left lumpectomy: IDC  grade 2, 3.4 cm, Margins Neg, 0/1 LN neg, ER 90%, PR 70%, Ki-67 30%, HER-2 positive ratio 2.43 T2N0 (stage 2 A)      10/09/2016 -  Chemotherapy    TCH Perjeta 6 cycles (Dose reduced due to peripheral neuropathy/neutropenia, last cycle without Taxotere due  followed by Herceptin Perjeta maintenance for 1 year      12/20/2016 Genetic Testing    Genetic counseling and testing for hereditary cancer syndromes performed on 11/22/2016. Results are negative for pathogenic mutations in 46 genes analyzed by [facility]'s Common Hereditary Cancers Panel. Results are dated 12/20/2016. Genes tested: APC, ATM, AXIN2, BARD1, BMPR1A, BRCA1, BRCA2, BRIP1, CDH1, CDKN2A, CHEK2, CTNNA1, DICER1, EPCAM, GREM1, KIT, MEN1, MLH1, MSH2, MSH3, MSH6, MUTYH, NBN, NF1, NTHL1, PALB2, PDGFRA, PMS2, POLD1, POLE, PTEN, RAD50, RAD51C, RAD51D, SDHA, SDHB, SDHC, SDHD, SMAD4, SMARCA4, STK11, TP53, TSC1, TSC2, and VHL.  A variant of uncertain significance (not clinically actionable) was noted in the KIT gene called [id number].[id number]>G (p.Ser867Arg). At this time, it is unknown if this variant is associated with increased cancer risk or if this is a normal finding. It should not be used to make medical management decisions.       Patient denies pain or lymphedema issues at this time. She reports numbness of the left arm but notes she is able to move it adequately. She notes she has had a cough and congestion since last Thursday.                  ALLERGIES:  is allergic to gabapentin; naproxen; neulasta [pegfilgrastim]; erythromycin;  and tramadol hcl.  Meds: Current Outpatient Prescriptions  Medication Sig Dispense Refill  . hydrochlorothiazide (HYDRODIURIL) 25 MG tablet TAKE 1 TABLET BY MOUTH EVERY DAY 30 tablet 5  . ibuprofen (ADVIL,MOTRIN) 200 MG tablet Take 400 mg by mouth every 4 (four) hours as needed for headache, mild pain or moderate pain.     Marland Kitchen lidocaine-prilocaine (EMLA) cream Apply to affected area once 30 g 3  . LORazepam (ATIVAN) 0.5  MG tablet Take 1 tablet (0.5 mg total) by mouth every 6 (six) hours as needed (Nausea or vomiting). 30 tablet 1  . ondansetron (ZOFRAN) 8 MG tablet TAKE 1 TABLET BY MOUTH TWICE DAILY AS NEEDED FOR REFRACTORY NAUSEA AND VOMITING. START ON DAY 3 AFTER CHEMO 30 tablet 1  . pantoprazole (PROTONIX) 40 MG tablet Take 1 tablet (40 mg total) by mouth daily. 30 tablet 3  . prochlorperazine (COMPAZINE) 10 MG tablet Take 1 tablet (10 mg total) by mouth every 6 (six) hours as needed (Nausea or vomiting). 30 tablet 1  . dexamethasone (DECADRON) 4 MG tablet Take 1 tablet (4 mg total) by mouth daily. Take 1 tablet daily before chemotherapy and 1 tablet the day after with food. (Patient not taking: Reported on 01/31/2017) 30 tablet 1  . levocetirizine (XYZAL) 5 MG tablet Take 5 mg by mouth as needed for allergies.     No current facility-administered medications for this encounter.     Physical Findings: The patient is in no acute distress. Patient is alert and oriented.  height is 5' 4"  (1.626 m) and weight is 215 lb (97.5 kg). Her oral temperature is 97.7 F (36.5 C). Her blood pressure is 119/67 and her pulse is 78. Her oxygen saturation is 98%. .  No significant changes. Lungs are clear to auscultation bilaterally. Heart has regular rate and rhythm. No palpable cervical, supraclavicular, or axillary adenopathy. Abdomen soft, non-tender, normal bowel sounds. Right breast no palpable mass or nipple discharge. Left breast patient has a scar in the UOQ that has healed well. she has a second scar in the axillary region. No palpable signs of recurrence in the left breast. No nipple discharge or bleeding.  Lab Findings: Lab Results  Component Value Date   WBC 7.7 01/27/2017   HGB 11.4 (L) 01/27/2017   HCT 32.7 (L) 01/27/2017   MCV 91.1 01/27/2017   PLT 241 01/27/2017    Radiographic Findings: Dg Chest 2 View  Result Date: 01/27/2017 CLINICAL DATA:  Cough, body aches, chills, status post chemotherapy for  left breast cancer EXAM: CHEST  2 VIEW COMPARISON:  11/09/2016 FINDINGS: Lungs are clear.  No pleural effusion or pneumothorax. The heart is normal in size. Right chest port terminates in the lower SVC. Surgical clips in the left breast/axilla. Cervical spine fixation hardware.  Median sternotomy. IMPRESSION: No evidence of acute cardiopulmonary disease. Electronically Signed   By: Julian Hy M.D.   On: 01/27/2017 19:08    Impression:  Pathological Stage II-A Left Breast UOQ Invasive Ductal Carcinoma (T2, N0), ER+ / PR+ / Her2+, Grade 2. The patient would be a good candidate for breast conservation with radiation therapy directed to the left breast. I discussed the course of treatment, side effects, and potential toxicities with the patient. She appears to understand and wishes to proceed with treatment. A consent form was signed and a copy was placed in the patient's chart.  Plan:  CT simulation and treatment planning will be scheduled for 02/05/17 at [facility] to begin the following  week with about 6.5 weeks of treatment. Patient will consult with her PCP concerning her upper respiratory illness.  ____________________________________   This document serves as a record of services personally performed by Gery Pray, MD. It was created on his behalf by Bethann Humble, a trained medical scribe. The creation of this record is based on the scribe's personal observations and the provider's statements to them. This document has been checked and approved by the attending provider.

## 2017-02-05 ENCOUNTER — Ambulatory Visit
Admission: RE | Admit: 2017-02-05 | Discharge: 2017-02-05 | Disposition: A | Discharge status: Home / Self Care | Payer: BLUE CROSS/BLUE SHIELD | Source: Ambulatory Visit | Attending: Radiation Oncology | Admitting: Radiation Oncology

## 2017-02-05 DIAGNOSIS — C50412 Malignant neoplasm of upper-outer quadrant of left female breast: Secondary | ICD-10-CM | POA: Diagnosis not present

## 2017-02-05 DIAGNOSIS — Z510 Encounter for antineoplastic radiation therapy: Secondary | ICD-10-CM | POA: Diagnosis not present

## 2017-02-05 DIAGNOSIS — R05 Cough: Secondary | ICD-10-CM | POA: Diagnosis not present

## 2017-02-05 DIAGNOSIS — R200 Anesthesia of skin: Secondary | ICD-10-CM | POA: Diagnosis not present

## 2017-02-05 DIAGNOSIS — Z888 Allergy status to other drugs, medicaments and biological substances status: Secondary | ICD-10-CM | POA: Diagnosis not present

## 2017-02-05 DIAGNOSIS — Z170 Estrogen receptor positive status [ER+]: Secondary | ICD-10-CM | POA: Diagnosis not present

## 2017-02-05 DIAGNOSIS — G629 Polyneuropathy, unspecified: Secondary | ICD-10-CM | POA: Diagnosis not present

## 2017-02-05 DIAGNOSIS — Z79899 Other long term (current) drug therapy: Secondary | ICD-10-CM | POA: Diagnosis not present

## 2017-02-05 DIAGNOSIS — Z9221 Personal history of antineoplastic chemotherapy: Secondary | ICD-10-CM | POA: Diagnosis not present

## 2017-02-05 NOTE — Progress Notes (Signed)
  Radiation Oncology         [phone] ________________________________  Name: Stacy [name] MRN: [id number]  Date: 02/05/2017  DOB: 01-Nov-1960  SIMULATION AND TREATMENT PLANNING NOTE    ICD-9-CM ICD-10-CM   1. Malignant neoplasm of upper-outer quadrant of left breast in female, estrogen receptor positive (HCC) 174.4 C50.412    V86.0 Z17.0     DIAGNOSIS: Pathological Stage II-ALeftBreast UOQ Invasive Ductal Carcinoma (T2, N0), ER+/ PR+/ Her2+, Grade 2  NARRATIVE:  The patient was brought to the [facility].  Identity was confirmed.  All relevant records and images related to the planned course of therapy were reviewed.  The patient freely provided informed written consent to proceed with treatment after reviewing the details related to the planned course of therapy. The consent form was witnessed and verified by the simulation staff.  Then, the patient was set-up in a stable reproducible  supine position for radiation therapy.  CT images were obtained.  Surface markings were placed.  The CT images were loaded into the planning software.  Then the target and avoidance structures were contoured.  Treatment planning then occurred.  The radiation prescription was entered and confirmed.  Then, I designed and supervised the construction of a total of 3 medically necessary complex treatment devices.  I have requested : 3D Simulation  I have requested a DVH of the following structures: lumpectomy cavity, heart, lungs.  I have ordered:dose calc.  PLAN:  The patient will receive 50.4 Gy in 28 fractions Directed at the left breast Followed by a boost to the lumpectomy cavity of 10 gray for a cumulative dose of 60.4 gray  -----------------------------------  Blair Promise, PhD, MD

## 2017-02-12 DIAGNOSIS — Z79899 Other long term (current) drug therapy: Secondary | ICD-10-CM | POA: Diagnosis not present

## 2017-02-12 DIAGNOSIS — Z170 Estrogen receptor positive status [ER+]: Secondary | ICD-10-CM | POA: Diagnosis not present

## 2017-02-12 DIAGNOSIS — Z9221 Personal history of antineoplastic chemotherapy: Secondary | ICD-10-CM | POA: Diagnosis not present

## 2017-02-12 DIAGNOSIS — C50412 Malignant neoplasm of upper-outer quadrant of left female breast: Secondary | ICD-10-CM | POA: Diagnosis not present

## 2017-02-12 DIAGNOSIS — R05 Cough: Secondary | ICD-10-CM | POA: Diagnosis not present

## 2017-02-12 DIAGNOSIS — R200 Anesthesia of skin: Secondary | ICD-10-CM | POA: Diagnosis not present

## 2017-02-12 DIAGNOSIS — Z888 Allergy status to other drugs, medicaments and biological substances status: Secondary | ICD-10-CM | POA: Diagnosis not present

## 2017-02-12 DIAGNOSIS — Z510 Encounter for antineoplastic radiation therapy: Secondary | ICD-10-CM | POA: Diagnosis not present

## 2017-02-12 DIAGNOSIS — G629 Polyneuropathy, unspecified: Secondary | ICD-10-CM | POA: Diagnosis not present

## 2017-02-13 ENCOUNTER — Ambulatory Visit (HOSPITAL_BASED_OUTPATIENT_CLINIC_OR_DEPARTMENT_OTHER): Payer: BLUE CROSS/BLUE SHIELD

## 2017-02-13 ENCOUNTER — Ambulatory Visit
Admission: RE | Admit: 2017-02-13 | Discharge: 2017-02-13 | Disposition: A | Discharge status: Home / Self Care | Payer: BLUE CROSS/BLUE SHIELD | Source: Ambulatory Visit | Attending: Radiation Oncology | Admitting: Radiation Oncology

## 2017-02-13 DIAGNOSIS — C50412 Malignant neoplasm of upper-outer quadrant of left female breast: Secondary | ICD-10-CM

## 2017-02-13 DIAGNOSIS — Z170 Estrogen receptor positive status [ER+]: Principal | ICD-10-CM

## 2017-02-13 DIAGNOSIS — Z9221 Personal history of antineoplastic chemotherapy: Secondary | ICD-10-CM | POA: Diagnosis not present

## 2017-02-13 DIAGNOSIS — Z510 Encounter for antineoplastic radiation therapy: Secondary | ICD-10-CM | POA: Diagnosis not present

## 2017-02-13 DIAGNOSIS — Z79899 Other long term (current) drug therapy: Secondary | ICD-10-CM | POA: Diagnosis not present

## 2017-02-13 DIAGNOSIS — G629 Polyneuropathy, unspecified: Secondary | ICD-10-CM | POA: Diagnosis not present

## 2017-02-13 DIAGNOSIS — R05 Cough: Secondary | ICD-10-CM | POA: Diagnosis not present

## 2017-02-13 DIAGNOSIS — Z888 Allergy status to other drugs, medicaments and biological substances status: Secondary | ICD-10-CM | POA: Diagnosis not present

## 2017-02-13 DIAGNOSIS — Z5112 Encounter for antineoplastic immunotherapy: Secondary | ICD-10-CM

## 2017-02-13 DIAGNOSIS — R200 Anesthesia of skin: Secondary | ICD-10-CM | POA: Diagnosis not present

## 2017-02-13 MED ORDER — ACETAMINOPHEN 325 MG PO TABS
650.0000 mg | ORAL_TABLET | Freq: Once | ORAL | Status: AC
  Administered 2017-02-13: 650 mg via ORAL

## 2017-02-13 MED ORDER — SODIUM CHLORIDE 0.9 % IV SOLN
420.0000 mg | Freq: Once | INTRAVENOUS | Status: AC
  Administered 2017-02-13: 420 mg via INTRAVENOUS
  Filled 2017-02-13: qty 14

## 2017-02-13 MED ORDER — DIPHENHYDRAMINE HCL 25 MG PO CAPS
50.0000 mg | ORAL_CAPSULE | Freq: Once | ORAL | Status: AC
  Administered 2017-02-13: 25 mg via ORAL

## 2017-02-13 MED ORDER — SODIUM CHLORIDE 0.9% FLUSH
10.0000 mL | INTRAVENOUS | Status: DC | PRN
  Administered 2017-02-13: 10 mL
  Filled 2017-02-13: qty 10

## 2017-02-13 MED ORDER — HEPARIN SOD (PORK) LOCK FLUSH 100 UNIT/ML IV SOLN
500.0000 [IU] | Freq: Once | INTRAVENOUS | Status: AC | PRN
Start: 2017-02-13 — End: 2017-02-13
  Administered 2017-02-13: 500 [IU]
  Filled 2017-02-13: qty 5

## 2017-02-13 MED ORDER — SODIUM CHLORIDE 0.9 % IV SOLN
Freq: Once | INTRAVENOUS | Status: AC
  Administered 2017-02-13: 10:00:00 via INTRAVENOUS

## 2017-02-13 MED ORDER — SODIUM CHLORIDE 0.9 % IV SOLN
6.0000 mg/kg | Freq: Once | INTRAVENOUS | Status: AC
  Administered 2017-02-13: 630 mg via INTRAVENOUS
  Filled 2017-02-13: qty 30

## 2017-02-13 MED ORDER — ACETAMINOPHEN 325 MG PO TABS
ORAL_TABLET | ORAL | Status: AC
  Filled 2017-02-13: qty 2

## 2017-02-13 MED ORDER — DIPHENHYDRAMINE HCL 25 MG PO CAPS
ORAL_CAPSULE | ORAL | Status: AC
  Filled 2017-02-13: qty 1

## 2017-02-13 NOTE — Progress Notes (Signed)
  Radiation Oncology         [phone] ________________________________  Name: Stacy [name] MRN: [id number]  Date: 02/13/2017  DOB: 1960-11-19  Simulation Verification Note    ICD-9-CM ICD-10-CM   1. Malignant neoplasm of upper-outer quadrant of left breast in female, estrogen receptor positive ([place]) 174.4 C50.412    V86.0 Z17.0     Status: outpatient  NARRATIVE: The patient was brought to the treatment unit and placed in the planned treatment position. The clinical setup was verified. Then port films were obtained and uploaded to the radiation oncology medical record software.  The treatment beams were carefully compared against the planned radiation fields. The position location and shape of the radiation fields was reviewed. They targeted volume of tissue appears to be appropriately covered by the radiation beams. Organs at risk appear to be excluded as planned.  Based on my personal review, I approved the simulation verification. The patient's treatment will proceed as planned.  -----------------------------------  Blair Promise, PhD, MD

## 2017-02-13 NOTE — Patient Instructions (Signed)
[facility] Discharge Instructions for Patients Receiving Chemotherapy  Today you received the following chemotherapy agents Herceptin/Perjeta  To help prevent nausea and vomiting after your treatment, we encourage you to take your nausea medication    If you develop nausea and vomiting that is not controlled by your nausea medication, call the clinic.   BELOW ARE SYMPTOMS THAT SHOULD BE REPORTED IMMEDIATELY:  *FEVER GREATER THAN 100.5 F  *CHILLS WITH OR WITHOUT FEVER  NAUSEA AND VOMITING THAT IS NOT CONTROLLED WITH YOUR NAUSEA MEDICATION  *UNUSUAL SHORTNESS OF BREATH  *UNUSUAL BRUISING OR BLEEDING  TENDERNESS IN MOUTH AND THROAT WITH OR WITHOUT PRESENCE OF ULCERS  *URINARY PROBLEMS  *BOWEL PROBLEMS  UNUSUAL RASH Items with * indicate a potential emergency and should be followed up as soon as possible.  Feel free to call the clinic you have any questions or concerns. The clinic phone number is [phone].  Please show the CHEMO ALERT CARD at check-in to the Emergency Department and triage nurse.   

## 2017-02-14 ENCOUNTER — Ambulatory Visit
Admission: RE | Admit: 2017-02-14 | Discharge: 2017-02-14 | Disposition: A | Discharge status: Home / Self Care | Payer: BLUE CROSS/BLUE SHIELD | Source: Ambulatory Visit | Attending: Radiation Oncology | Admitting: Radiation Oncology

## 2017-02-14 DIAGNOSIS — R05 Cough: Secondary | ICD-10-CM | POA: Diagnosis not present

## 2017-02-14 DIAGNOSIS — Z9221 Personal history of antineoplastic chemotherapy: Secondary | ICD-10-CM | POA: Diagnosis not present

## 2017-02-14 DIAGNOSIS — C50412 Malignant neoplasm of upper-outer quadrant of left female breast: Secondary | ICD-10-CM | POA: Diagnosis not present

## 2017-02-14 DIAGNOSIS — Z170 Estrogen receptor positive status [ER+]: Secondary | ICD-10-CM | POA: Diagnosis not present

## 2017-02-14 DIAGNOSIS — Z79899 Other long term (current) drug therapy: Secondary | ICD-10-CM | POA: Diagnosis not present

## 2017-02-14 DIAGNOSIS — Z888 Allergy status to other drugs, medicaments and biological substances status: Secondary | ICD-10-CM | POA: Diagnosis not present

## 2017-02-14 DIAGNOSIS — Z510 Encounter for antineoplastic radiation therapy: Secondary | ICD-10-CM | POA: Diagnosis not present

## 2017-02-14 DIAGNOSIS — R200 Anesthesia of skin: Secondary | ICD-10-CM | POA: Diagnosis not present

## 2017-02-14 DIAGNOSIS — G629 Polyneuropathy, unspecified: Secondary | ICD-10-CM | POA: Diagnosis not present

## 2017-02-15 ENCOUNTER — Ambulatory Visit
Admission: RE | Admit: 2017-02-15 | Discharge: 2017-02-15 | Disposition: A | Discharge status: Home / Self Care | Payer: BLUE CROSS/BLUE SHIELD | Source: Ambulatory Visit | Attending: Radiation Oncology | Admitting: Radiation Oncology

## 2017-02-15 DIAGNOSIS — G629 Polyneuropathy, unspecified: Secondary | ICD-10-CM | POA: Diagnosis not present

## 2017-02-15 DIAGNOSIS — Z888 Allergy status to other drugs, medicaments and biological substances status: Secondary | ICD-10-CM | POA: Diagnosis not present

## 2017-02-15 DIAGNOSIS — C50412 Malignant neoplasm of upper-outer quadrant of left female breast: Secondary | ICD-10-CM | POA: Diagnosis not present

## 2017-02-15 DIAGNOSIS — Z510 Encounter for antineoplastic radiation therapy: Secondary | ICD-10-CM | POA: Diagnosis not present

## 2017-02-15 DIAGNOSIS — R05 Cough: Secondary | ICD-10-CM | POA: Diagnosis not present

## 2017-02-15 DIAGNOSIS — Z79899 Other long term (current) drug therapy: Secondary | ICD-10-CM | POA: Diagnosis not present

## 2017-02-15 DIAGNOSIS — Z170 Estrogen receptor positive status [ER+]: Secondary | ICD-10-CM | POA: Diagnosis not present

## 2017-02-15 DIAGNOSIS — R200 Anesthesia of skin: Secondary | ICD-10-CM | POA: Diagnosis not present

## 2017-02-15 DIAGNOSIS — Z9221 Personal history of antineoplastic chemotherapy: Secondary | ICD-10-CM | POA: Diagnosis not present

## 2017-02-18 ENCOUNTER — Ambulatory Visit
Admission: RE | Admit: 2017-02-18 | Discharge: 2017-02-18 | Disposition: A | Discharge status: Home / Self Care | Payer: BLUE CROSS/BLUE SHIELD | Source: Ambulatory Visit | Attending: Radiation Oncology | Admitting: Radiation Oncology

## 2017-02-18 DIAGNOSIS — Z510 Encounter for antineoplastic radiation therapy: Secondary | ICD-10-CM | POA: Diagnosis not present

## 2017-02-18 DIAGNOSIS — G629 Polyneuropathy, unspecified: Secondary | ICD-10-CM | POA: Diagnosis not present

## 2017-02-18 DIAGNOSIS — Z888 Allergy status to other drugs, medicaments and biological substances status: Secondary | ICD-10-CM | POA: Diagnosis not present

## 2017-02-18 DIAGNOSIS — Z9221 Personal history of antineoplastic chemotherapy: Secondary | ICD-10-CM | POA: Diagnosis not present

## 2017-02-18 DIAGNOSIS — C50412 Malignant neoplasm of upper-outer quadrant of left female breast: Secondary | ICD-10-CM | POA: Diagnosis not present

## 2017-02-18 DIAGNOSIS — Z79899 Other long term (current) drug therapy: Secondary | ICD-10-CM | POA: Diagnosis not present

## 2017-02-18 DIAGNOSIS — Z170 Estrogen receptor positive status [ER+]: Secondary | ICD-10-CM | POA: Diagnosis not present

## 2017-02-18 DIAGNOSIS — R05 Cough: Secondary | ICD-10-CM | POA: Diagnosis not present

## 2017-02-18 DIAGNOSIS — R200 Anesthesia of skin: Secondary | ICD-10-CM | POA: Diagnosis not present

## 2017-02-19 ENCOUNTER — Ambulatory Visit
Admission: RE | Admit: 2017-02-19 | Discharge: 2017-02-19 | Disposition: A | Discharge status: Home / Self Care | Payer: BLUE CROSS/BLUE SHIELD | Source: Ambulatory Visit | Attending: Radiation Oncology | Admitting: Radiation Oncology

## 2017-02-19 ENCOUNTER — Encounter: Payer: Self-pay | Admitting: Radiation Oncology

## 2017-02-19 VITALS — BP 146/76 | HR 88 | Temp 98.3°F | Ht 64.0 in | Wt 216.0 lb

## 2017-02-19 DIAGNOSIS — Z888 Allergy status to other drugs, medicaments and biological substances status: Secondary | ICD-10-CM | POA: Diagnosis not present

## 2017-02-19 DIAGNOSIS — C50412 Malignant neoplasm of upper-outer quadrant of left female breast: Secondary | ICD-10-CM

## 2017-02-19 DIAGNOSIS — Z170 Estrogen receptor positive status [ER+]: Secondary | ICD-10-CM | POA: Diagnosis not present

## 2017-02-19 DIAGNOSIS — G629 Polyneuropathy, unspecified: Secondary | ICD-10-CM | POA: Diagnosis not present

## 2017-02-19 DIAGNOSIS — R200 Anesthesia of skin: Secondary | ICD-10-CM | POA: Diagnosis not present

## 2017-02-19 DIAGNOSIS — Z79899 Other long term (current) drug therapy: Secondary | ICD-10-CM | POA: Diagnosis not present

## 2017-02-19 DIAGNOSIS — Z510 Encounter for antineoplastic radiation therapy: Secondary | ICD-10-CM | POA: Diagnosis not present

## 2017-02-19 DIAGNOSIS — R05 Cough: Secondary | ICD-10-CM | POA: Diagnosis not present

## 2017-02-19 DIAGNOSIS — Z9221 Personal history of antineoplastic chemotherapy: Secondary | ICD-10-CM | POA: Diagnosis not present

## 2017-02-19 MED ORDER — ALRA NON-METALLIC DEODORANT (RAD-ONC)
1.0000 "application " | Freq: Once | TOPICAL | Status: AC
  Administered 2017-02-19: 1 via TOPICAL

## 2017-02-19 MED ORDER — RADIAPLEXRX EX GEL
Freq: Once | CUTANEOUS | Status: AC
  Administered 2017-02-19: 14:00:00 via TOPICAL

## 2017-02-19 NOTE — Progress Notes (Signed)
Stacy [name] has completed 4 fractions to her left breast.  She reports pain from neuropathy in her hands and feet.  She reports having fatigue.  The skin on her left breast is intact.  She does have a sunburn on her upper left chest.  BP (!) 146/76 (BP Location: Right Arm, Patient Position: Sitting)   Pulse 88   Temp 98.3 F (36.8 C) (Oral)   Ht 5\' 4"  (1.626 m)   Wt 216 lb (98 kg)   SpO2 97%   BMI 37.08 kg/m    Wt Readings from Last 3 Encounters:  02/19/17 216 lb (98 kg)  01/31/17 215 lb (97.5 kg)  01/28/17 200 lb (90.7 kg)

## 2017-02-19 NOTE — Progress Notes (Signed)
Pt here for patient teaching.  Pt given Radiation and You booklet, Alra deodorant and Radiaplex gel.  Reviewed areas of pertinence such as fatigue and skin changes . Pt able to give teach back of to pat skin and use unscented/gentle soap,apply Radiaplex bid, avoid applying anything to skin within 4 hours of treatment and to use an electric razor if they must shave. Pt demonstrated understanding and verbalizes understanding of information given and will contact nursing with any questions or concerns.

## 2017-02-19 NOTE — Progress Notes (Signed)
  Radiation Oncology         [phone] ________________________________  Name: Stacy [name] MRN: [id number]  Date: 02/19/2017  DOB: July 03, 1961  Weekly Radiation Therapy Management    ICD-9-CM ICD-10-CM   1. Malignant neoplasm of upper-outer quadrant of left breast in female, estrogen receptor positive (HCC) 174.4 C50.412    V86.0 Z17.0      Current Dose: 7.2 Gy     Planned Dose:  60.4 Gy  Narrative . . . . . . . . The patient presents for routine under treatment assessment.                                   Stacy [name] has completed 4 fractions to her left breast.  She reports pain from neuropathy in her hands and feet.  She reports having fatigue.  The skin on her left breast is intact.  She does have a mild sunburn on her upper chest.                                 Set-up films were reviewed.                                 The chart was checked. Physical Findings. . .  height is 5\' 4"  (1.626 m) and weight is 216 lb (98 kg). Her oral temperature is 98.3 F (36.8 C). Her blood pressure is 146/76 (abnormal) and her pulse is 88. Her oxygen saturation is 97%. .The lungs are clear. The heart has regular rhythm and rate. The left breast area shows some mild erythema.  Impression . . . . . . . The patient is tolerating radiation. Plan . . . . . . . . . . . . Continue treatment as planned.  ________________________________   Blair Promise, PhD, MD

## 2017-02-20 ENCOUNTER — Ambulatory Visit
Admission: RE | Admit: 2017-02-20 | Discharge: 2017-02-20 | Disposition: A | Discharge status: Home / Self Care | Payer: BLUE CROSS/BLUE SHIELD | Source: Ambulatory Visit | Attending: Radiation Oncology | Admitting: Radiation Oncology

## 2017-02-20 DIAGNOSIS — Z510 Encounter for antineoplastic radiation therapy: Secondary | ICD-10-CM | POA: Diagnosis not present

## 2017-02-20 DIAGNOSIS — R200 Anesthesia of skin: Secondary | ICD-10-CM | POA: Diagnosis not present

## 2017-02-20 DIAGNOSIS — Z9221 Personal history of antineoplastic chemotherapy: Secondary | ICD-10-CM | POA: Diagnosis not present

## 2017-02-20 DIAGNOSIS — C50412 Malignant neoplasm of upper-outer quadrant of left female breast: Secondary | ICD-10-CM | POA: Diagnosis not present

## 2017-02-20 DIAGNOSIS — Z888 Allergy status to other drugs, medicaments and biological substances status: Secondary | ICD-10-CM | POA: Diagnosis not present

## 2017-02-20 DIAGNOSIS — R05 Cough: Secondary | ICD-10-CM | POA: Diagnosis not present

## 2017-02-20 DIAGNOSIS — Z170 Estrogen receptor positive status [ER+]: Secondary | ICD-10-CM | POA: Diagnosis not present

## 2017-02-20 DIAGNOSIS — Z79899 Other long term (current) drug therapy: Secondary | ICD-10-CM | POA: Diagnosis not present

## 2017-02-20 DIAGNOSIS — G629 Polyneuropathy, unspecified: Secondary | ICD-10-CM | POA: Diagnosis not present

## 2017-02-21 ENCOUNTER — Ambulatory Visit
Admission: RE | Admit: 2017-02-21 | Discharge: 2017-02-21 | Disposition: A | Discharge status: Home / Self Care | Payer: BLUE CROSS/BLUE SHIELD | Source: Ambulatory Visit | Attending: Radiation Oncology | Admitting: Radiation Oncology

## 2017-02-21 DIAGNOSIS — Z510 Encounter for antineoplastic radiation therapy: Secondary | ICD-10-CM | POA: Diagnosis not present

## 2017-02-21 DIAGNOSIS — G629 Polyneuropathy, unspecified: Secondary | ICD-10-CM | POA: Diagnosis not present

## 2017-02-21 DIAGNOSIS — Z9221 Personal history of antineoplastic chemotherapy: Secondary | ICD-10-CM | POA: Diagnosis not present

## 2017-02-21 DIAGNOSIS — Z888 Allergy status to other drugs, medicaments and biological substances status: Secondary | ICD-10-CM | POA: Diagnosis not present

## 2017-02-21 DIAGNOSIS — R05 Cough: Secondary | ICD-10-CM | POA: Diagnosis not present

## 2017-02-21 DIAGNOSIS — Z79899 Other long term (current) drug therapy: Secondary | ICD-10-CM | POA: Diagnosis not present

## 2017-02-21 DIAGNOSIS — R200 Anesthesia of skin: Secondary | ICD-10-CM | POA: Diagnosis not present

## 2017-02-21 DIAGNOSIS — C50412 Malignant neoplasm of upper-outer quadrant of left female breast: Secondary | ICD-10-CM | POA: Diagnosis not present

## 2017-02-21 DIAGNOSIS — Z170 Estrogen receptor positive status [ER+]: Secondary | ICD-10-CM | POA: Diagnosis not present

## 2017-02-22 ENCOUNTER — Ambulatory Visit
Admission: RE | Admit: 2017-02-22 | Discharge: 2017-02-22 | Disposition: A | Discharge status: Home / Self Care | Payer: BLUE CROSS/BLUE SHIELD | Source: Ambulatory Visit | Attending: Radiation Oncology | Admitting: Radiation Oncology

## 2017-02-22 DIAGNOSIS — Z170 Estrogen receptor positive status [ER+]: Secondary | ICD-10-CM | POA: Diagnosis not present

## 2017-02-22 DIAGNOSIS — C50412 Malignant neoplasm of upper-outer quadrant of left female breast: Secondary | ICD-10-CM | POA: Diagnosis not present

## 2017-02-22 DIAGNOSIS — Z888 Allergy status to other drugs, medicaments and biological substances status: Secondary | ICD-10-CM | POA: Diagnosis not present

## 2017-02-22 DIAGNOSIS — Z79899 Other long term (current) drug therapy: Secondary | ICD-10-CM | POA: Diagnosis not present

## 2017-02-22 DIAGNOSIS — G629 Polyneuropathy, unspecified: Secondary | ICD-10-CM | POA: Diagnosis not present

## 2017-02-22 DIAGNOSIS — Z510 Encounter for antineoplastic radiation therapy: Secondary | ICD-10-CM | POA: Diagnosis not present

## 2017-02-22 DIAGNOSIS — R200 Anesthesia of skin: Secondary | ICD-10-CM | POA: Diagnosis not present

## 2017-02-22 DIAGNOSIS — Z9221 Personal history of antineoplastic chemotherapy: Secondary | ICD-10-CM | POA: Diagnosis not present

## 2017-02-22 DIAGNOSIS — R05 Cough: Secondary | ICD-10-CM | POA: Diagnosis not present

## 2017-02-25 ENCOUNTER — Ambulatory Visit
Admission: RE | Admit: 2017-02-25 | Discharge: 2017-02-25 | Disposition: A | Discharge status: Home / Self Care | Payer: BLUE CROSS/BLUE SHIELD | Source: Ambulatory Visit | Attending: Radiation Oncology | Admitting: Radiation Oncology

## 2017-02-25 DIAGNOSIS — Z888 Allergy status to other drugs, medicaments and biological substances status: Secondary | ICD-10-CM | POA: Diagnosis not present

## 2017-02-25 DIAGNOSIS — Z79899 Other long term (current) drug therapy: Secondary | ICD-10-CM | POA: Diagnosis not present

## 2017-02-25 DIAGNOSIS — Z9221 Personal history of antineoplastic chemotherapy: Secondary | ICD-10-CM | POA: Diagnosis not present

## 2017-02-25 DIAGNOSIS — C50412 Malignant neoplasm of upper-outer quadrant of left female breast: Secondary | ICD-10-CM | POA: Diagnosis not present

## 2017-02-25 DIAGNOSIS — G629 Polyneuropathy, unspecified: Secondary | ICD-10-CM | POA: Diagnosis not present

## 2017-02-25 DIAGNOSIS — R200 Anesthesia of skin: Secondary | ICD-10-CM | POA: Diagnosis not present

## 2017-02-25 DIAGNOSIS — Z510 Encounter for antineoplastic radiation therapy: Secondary | ICD-10-CM | POA: Diagnosis not present

## 2017-02-25 DIAGNOSIS — Z170 Estrogen receptor positive status [ER+]: Secondary | ICD-10-CM | POA: Diagnosis not present

## 2017-02-25 DIAGNOSIS — R05 Cough: Secondary | ICD-10-CM | POA: Diagnosis not present

## 2017-02-26 ENCOUNTER — Ambulatory Visit
Admission: RE | Admit: 2017-02-26 | Discharge: 2017-02-26 | Disposition: A | Discharge status: Home / Self Care | Payer: BLUE CROSS/BLUE SHIELD | Source: Ambulatory Visit | Attending: Radiation Oncology | Admitting: Radiation Oncology

## 2017-02-26 ENCOUNTER — Ambulatory Visit: Payer: BLUE CROSS/BLUE SHIELD | Admitting: Radiation Oncology

## 2017-02-26 DIAGNOSIS — G629 Polyneuropathy, unspecified: Secondary | ICD-10-CM | POA: Diagnosis not present

## 2017-02-26 DIAGNOSIS — R05 Cough: Secondary | ICD-10-CM | POA: Diagnosis not present

## 2017-02-26 DIAGNOSIS — Z170 Estrogen receptor positive status [ER+]: Secondary | ICD-10-CM | POA: Diagnosis not present

## 2017-02-26 DIAGNOSIS — R200 Anesthesia of skin: Secondary | ICD-10-CM | POA: Diagnosis not present

## 2017-02-26 DIAGNOSIS — Z510 Encounter for antineoplastic radiation therapy: Secondary | ICD-10-CM | POA: Diagnosis not present

## 2017-02-26 DIAGNOSIS — Z9221 Personal history of antineoplastic chemotherapy: Secondary | ICD-10-CM | POA: Diagnosis not present

## 2017-02-26 DIAGNOSIS — Z888 Allergy status to other drugs, medicaments and biological substances status: Secondary | ICD-10-CM | POA: Diagnosis not present

## 2017-02-26 DIAGNOSIS — C50412 Malignant neoplasm of upper-outer quadrant of left female breast: Secondary | ICD-10-CM | POA: Diagnosis not present

## 2017-02-26 DIAGNOSIS — Z79899 Other long term (current) drug therapy: Secondary | ICD-10-CM | POA: Diagnosis not present

## 2017-02-27 ENCOUNTER — Ambulatory Visit
Admission: RE | Admit: 2017-02-27 | Discharge: 2017-02-27 | Disposition: A | Discharge status: Home / Self Care | Payer: BLUE CROSS/BLUE SHIELD | Source: Ambulatory Visit | Attending: Radiation Oncology | Admitting: Radiation Oncology

## 2017-02-27 DIAGNOSIS — Z9221 Personal history of antineoplastic chemotherapy: Secondary | ICD-10-CM | POA: Diagnosis not present

## 2017-02-27 DIAGNOSIS — Z170 Estrogen receptor positive status [ER+]: Secondary | ICD-10-CM | POA: Diagnosis not present

## 2017-02-27 DIAGNOSIS — Z79899 Other long term (current) drug therapy: Secondary | ICD-10-CM | POA: Diagnosis not present

## 2017-02-27 DIAGNOSIS — Z510 Encounter for antineoplastic radiation therapy: Secondary | ICD-10-CM | POA: Diagnosis not present

## 2017-02-27 DIAGNOSIS — R05 Cough: Secondary | ICD-10-CM | POA: Diagnosis not present

## 2017-02-27 DIAGNOSIS — C50412 Malignant neoplasm of upper-outer quadrant of left female breast: Secondary | ICD-10-CM | POA: Diagnosis not present

## 2017-02-27 DIAGNOSIS — Z888 Allergy status to other drugs, medicaments and biological substances status: Secondary | ICD-10-CM | POA: Diagnosis not present

## 2017-02-27 DIAGNOSIS — R200 Anesthesia of skin: Secondary | ICD-10-CM | POA: Diagnosis not present

## 2017-02-27 DIAGNOSIS — G629 Polyneuropathy, unspecified: Secondary | ICD-10-CM | POA: Diagnosis not present

## 2017-02-28 ENCOUNTER — Ambulatory Visit
Admission: RE | Admit: 2017-02-28 | Discharge: 2017-02-28 | Disposition: A | Discharge status: Home / Self Care | Payer: BLUE CROSS/BLUE SHIELD | Source: Ambulatory Visit | Attending: Radiation Oncology | Admitting: Radiation Oncology

## 2017-02-28 DIAGNOSIS — R05 Cough: Secondary | ICD-10-CM | POA: Diagnosis not present

## 2017-02-28 DIAGNOSIS — Z510 Encounter for antineoplastic radiation therapy: Secondary | ICD-10-CM | POA: Diagnosis not present

## 2017-02-28 DIAGNOSIS — C50412 Malignant neoplasm of upper-outer quadrant of left female breast: Secondary | ICD-10-CM | POA: Diagnosis not present

## 2017-02-28 DIAGNOSIS — G629 Polyneuropathy, unspecified: Secondary | ICD-10-CM | POA: Diagnosis not present

## 2017-02-28 DIAGNOSIS — R200 Anesthesia of skin: Secondary | ICD-10-CM | POA: Diagnosis not present

## 2017-02-28 DIAGNOSIS — Z9221 Personal history of antineoplastic chemotherapy: Secondary | ICD-10-CM | POA: Diagnosis not present

## 2017-02-28 DIAGNOSIS — Z888 Allergy status to other drugs, medicaments and biological substances status: Secondary | ICD-10-CM | POA: Diagnosis not present

## 2017-02-28 DIAGNOSIS — Z79899 Other long term (current) drug therapy: Secondary | ICD-10-CM | POA: Diagnosis not present

## 2017-02-28 DIAGNOSIS — Z170 Estrogen receptor positive status [ER+]: Secondary | ICD-10-CM | POA: Diagnosis not present

## 2017-03-01 ENCOUNTER — Ambulatory Visit
Admission: RE | Admit: 2017-03-01 | Discharge: 2017-03-01 | Disposition: A | Discharge status: Home / Self Care | Payer: BLUE CROSS/BLUE SHIELD | Source: Ambulatory Visit | Attending: Radiation Oncology | Admitting: Radiation Oncology

## 2017-03-01 DIAGNOSIS — G629 Polyneuropathy, unspecified: Secondary | ICD-10-CM | POA: Diagnosis not present

## 2017-03-01 DIAGNOSIS — Z170 Estrogen receptor positive status [ER+]: Secondary | ICD-10-CM | POA: Diagnosis not present

## 2017-03-01 DIAGNOSIS — Z9221 Personal history of antineoplastic chemotherapy: Secondary | ICD-10-CM | POA: Diagnosis not present

## 2017-03-01 DIAGNOSIS — C50412 Malignant neoplasm of upper-outer quadrant of left female breast: Secondary | ICD-10-CM | POA: Diagnosis not present

## 2017-03-01 DIAGNOSIS — Z510 Encounter for antineoplastic radiation therapy: Secondary | ICD-10-CM | POA: Diagnosis not present

## 2017-03-01 DIAGNOSIS — Z888 Allergy status to other drugs, medicaments and biological substances status: Secondary | ICD-10-CM | POA: Diagnosis not present

## 2017-03-01 DIAGNOSIS — R05 Cough: Secondary | ICD-10-CM | POA: Diagnosis not present

## 2017-03-01 DIAGNOSIS — Z79899 Other long term (current) drug therapy: Secondary | ICD-10-CM | POA: Diagnosis not present

## 2017-03-01 DIAGNOSIS — R200 Anesthesia of skin: Secondary | ICD-10-CM | POA: Diagnosis not present

## 2017-03-04 ENCOUNTER — Ambulatory Visit
Admission: RE | Admit: 2017-03-04 | Discharge: 2017-03-04 | Disposition: A | Discharge status: Home / Self Care | Payer: BLUE CROSS/BLUE SHIELD | Source: Ambulatory Visit | Attending: Radiation Oncology | Admitting: Radiation Oncology

## 2017-03-04 DIAGNOSIS — C50412 Malignant neoplasm of upper-outer quadrant of left female breast: Secondary | ICD-10-CM | POA: Diagnosis not present

## 2017-03-04 DIAGNOSIS — Z170 Estrogen receptor positive status [ER+]: Secondary | ICD-10-CM | POA: Diagnosis not present

## 2017-03-04 DIAGNOSIS — R05 Cough: Secondary | ICD-10-CM | POA: Diagnosis not present

## 2017-03-04 DIAGNOSIS — G629 Polyneuropathy, unspecified: Secondary | ICD-10-CM | POA: Diagnosis not present

## 2017-03-04 DIAGNOSIS — Z888 Allergy status to other drugs, medicaments and biological substances status: Secondary | ICD-10-CM | POA: Diagnosis not present

## 2017-03-04 DIAGNOSIS — Z510 Encounter for antineoplastic radiation therapy: Secondary | ICD-10-CM | POA: Diagnosis not present

## 2017-03-04 DIAGNOSIS — Z9221 Personal history of antineoplastic chemotherapy: Secondary | ICD-10-CM | POA: Diagnosis not present

## 2017-03-04 DIAGNOSIS — Z79899 Other long term (current) drug therapy: Secondary | ICD-10-CM | POA: Diagnosis not present

## 2017-03-04 DIAGNOSIS — R200 Anesthesia of skin: Secondary | ICD-10-CM | POA: Diagnosis not present

## 2017-03-05 ENCOUNTER — Ambulatory Visit
Admission: RE | Admit: 2017-03-05 | Discharge: 2017-03-05 | Disposition: A | Discharge status: Home / Self Care | Payer: BLUE CROSS/BLUE SHIELD | Source: Ambulatory Visit | Attending: Radiation Oncology | Admitting: Radiation Oncology

## 2017-03-05 DIAGNOSIS — Z510 Encounter for antineoplastic radiation therapy: Secondary | ICD-10-CM | POA: Diagnosis not present

## 2017-03-05 DIAGNOSIS — R05 Cough: Secondary | ICD-10-CM | POA: Diagnosis not present

## 2017-03-05 DIAGNOSIS — R200 Anesthesia of skin: Secondary | ICD-10-CM | POA: Diagnosis not present

## 2017-03-05 DIAGNOSIS — C50412 Malignant neoplasm of upper-outer quadrant of left female breast: Secondary | ICD-10-CM | POA: Diagnosis not present

## 2017-03-05 DIAGNOSIS — Z170 Estrogen receptor positive status [ER+]: Secondary | ICD-10-CM | POA: Diagnosis not present

## 2017-03-05 DIAGNOSIS — G629 Polyneuropathy, unspecified: Secondary | ICD-10-CM | POA: Diagnosis not present

## 2017-03-05 DIAGNOSIS — Z888 Allergy status to other drugs, medicaments and biological substances status: Secondary | ICD-10-CM | POA: Diagnosis not present

## 2017-03-05 DIAGNOSIS — Z79899 Other long term (current) drug therapy: Secondary | ICD-10-CM | POA: Diagnosis not present

## 2017-03-05 DIAGNOSIS — Z9221 Personal history of antineoplastic chemotherapy: Secondary | ICD-10-CM | POA: Diagnosis not present

## 2017-03-06 ENCOUNTER — Other Ambulatory Visit: Payer: Self-pay | Admitting: Emergency Medicine

## 2017-03-06 ENCOUNTER — Other Ambulatory Visit (HOSPITAL_BASED_OUTPATIENT_CLINIC_OR_DEPARTMENT_OTHER): Payer: BLUE CROSS/BLUE SHIELD

## 2017-03-06 ENCOUNTER — Ambulatory Visit (HOSPITAL_BASED_OUTPATIENT_CLINIC_OR_DEPARTMENT_OTHER): Payer: BLUE CROSS/BLUE SHIELD | Admitting: Adult Health

## 2017-03-06 ENCOUNTER — Ambulatory Visit
Admission: RE | Admit: 2017-03-06 | Discharge: 2017-03-06 | Disposition: A | Discharge status: Home / Self Care | Payer: BLUE CROSS/BLUE SHIELD | Source: Ambulatory Visit | Attending: Radiation Oncology | Admitting: Radiation Oncology

## 2017-03-06 ENCOUNTER — Ambulatory Visit (HOSPITAL_BASED_OUTPATIENT_CLINIC_OR_DEPARTMENT_OTHER): Payer: BLUE CROSS/BLUE SHIELD

## 2017-03-06 ENCOUNTER — Encounter: Payer: Self-pay | Admitting: Adult Health

## 2017-03-06 ENCOUNTER — Ambulatory Visit: Payer: BLUE CROSS/BLUE SHIELD

## 2017-03-06 DIAGNOSIS — Z170 Estrogen receptor positive status [ER+]: Principal | ICD-10-CM

## 2017-03-06 DIAGNOSIS — Z9221 Personal history of antineoplastic chemotherapy: Secondary | ICD-10-CM | POA: Diagnosis not present

## 2017-03-06 DIAGNOSIS — C50412 Malignant neoplasm of upper-outer quadrant of left female breast: Secondary | ICD-10-CM | POA: Diagnosis not present

## 2017-03-06 DIAGNOSIS — Z510 Encounter for antineoplastic radiation therapy: Secondary | ICD-10-CM | POA: Diagnosis not present

## 2017-03-06 DIAGNOSIS — G629 Polyneuropathy, unspecified: Secondary | ICD-10-CM | POA: Diagnosis not present

## 2017-03-06 DIAGNOSIS — R05 Cough: Secondary | ICD-10-CM | POA: Diagnosis not present

## 2017-03-06 DIAGNOSIS — Z5112 Encounter for antineoplastic immunotherapy: Secondary | ICD-10-CM

## 2017-03-06 DIAGNOSIS — R200 Anesthesia of skin: Secondary | ICD-10-CM | POA: Diagnosis not present

## 2017-03-06 DIAGNOSIS — Z888 Allergy status to other drugs, medicaments and biological substances status: Secondary | ICD-10-CM | POA: Diagnosis not present

## 2017-03-06 DIAGNOSIS — Z95828 Presence of other vascular implants and grafts: Secondary | ICD-10-CM

## 2017-03-06 DIAGNOSIS — Z79899 Other long term (current) drug therapy: Secondary | ICD-10-CM | POA: Diagnosis not present

## 2017-03-06 LAB — COMPREHENSIVE METABOLIC PANEL
ALT: 16 U/L (ref 0–55)
AST: 15 U/L (ref 5–34)
Albumin: 3.7 g/dL (ref 3.5–5.0)
Alkaline Phosphatase: 71 U/L (ref 40–150)
Anion Gap: 11 mEq/L (ref 3–11)
BUN: 16.1 mg/dL (ref 7.0–26.0)
CO2: 26 meq/L (ref 22–29)
Calcium: 9.9 mg/dL (ref 8.4–10.4)
Chloride: 103 mEq/L (ref 98–109)
Creatinine: 1.1 mg/dL (ref 0.6–1.1)
EGFR: 57 mL/min/{1.73_m2} — AB (ref >90)
GLUCOSE: 87 mg/dL (ref 70–140)
POTASSIUM: 3.9 meq/L (ref 3.5–5.1)
SODIUM: 140 meq/L (ref 136–145)
Total Bilirubin: 0.37 mg/dL (ref 0.20–1.20)
Total Protein: 7.2 g/dL (ref 6.4–8.3)

## 2017-03-06 LAB — CBC WITH DIFFERENTIAL/PLATELET
BASO%: 0.4 % (ref 0.0–2.0)
BASOS ABS: 0 10*3/uL (ref 0.0–0.1)
EOS%: 0.9 % (ref 0.0–7.0)
Eosinophils Absolute: 0.1 10*3/uL (ref 0.0–0.5)
HEMATOCRIT: 33.3 % — AB (ref 34.8–46.6)
HEMOGLOBIN: 11.7 g/dL (ref 11.6–15.9)
LYMPH#: 1.3 10*3/uL (ref 0.9–3.3)
LYMPH%: 15.4 % (ref 14.0–49.7)
MCH: 33.5 pg (ref 25.1–34.0)
MCHC: 35.1 g/dL (ref 31.5–36.0)
MCV: 95.5 fL (ref 79.5–101.0)
MONO#: 0.8 10*3/uL (ref 0.1–0.9)
MONO%: 9.6 % (ref 0.0–14.0)
NEUT#: 6.2 10*3/uL (ref 1.5–6.5)
NEUT%: 73.7 % (ref 38.4–76.8)
PLATELETS: 293 10*3/uL (ref 145–400)
RBC: 3.49 10*6/uL — ABNORMAL LOW (ref 3.70–5.45)
RDW: 16.1 % — ABNORMAL HIGH (ref 11.2–14.5)
WBC: 8.5 10*3/uL (ref 3.9–10.3)

## 2017-03-06 MED ORDER — ACETAMINOPHEN 325 MG PO TABS
650.0000 mg | ORAL_TABLET | Freq: Once | ORAL | Status: AC
  Administered 2017-03-06: 650 mg via ORAL

## 2017-03-06 MED ORDER — TRASTUZUMAB CHEMO 150 MG IV SOLR
6.0000 mg/kg | Freq: Once | INTRAVENOUS | Status: AC
  Administered 2017-03-06: 630 mg via INTRAVENOUS
  Filled 2017-03-06: qty 30

## 2017-03-06 MED ORDER — HEPARIN SOD (PORK) LOCK FLUSH 100 UNIT/ML IV SOLN
500.0000 [IU] | Freq: Once | INTRAVENOUS | Status: DC | PRN
  Filled 2017-03-06: qty 5

## 2017-03-06 MED ORDER — PERTUZUMAB CHEMO INJECTION 420 MG/14ML
420.0000 mg | Freq: Once | INTRAVENOUS | Status: AC
  Administered 2017-03-06: 420 mg via INTRAVENOUS
  Filled 2017-03-06: qty 14

## 2017-03-06 MED ORDER — SODIUM CHLORIDE 0.9% FLUSH
10.0000 mL | INTRAVENOUS | Status: DC | PRN
  Filled 2017-03-06: qty 10

## 2017-03-06 MED ORDER — SODIUM CHLORIDE 0.9 % IV SOLN
Freq: Once | INTRAVENOUS | Status: AC
  Administered 2017-03-06: 11:00:00 via INTRAVENOUS

## 2017-03-06 MED ORDER — SODIUM CHLORIDE 0.9% FLUSH
10.0000 mL | INTRAVENOUS | Status: DC | PRN
  Administered 2017-03-06 (×2): 10 mL via INTRAVENOUS
  Filled 2017-03-06: qty 10

## 2017-03-06 MED ORDER — DIPHENHYDRAMINE HCL 25 MG PO CAPS: 50.0000 mg | ORAL_CAPSULE | Freq: Once | ORAL | Status: DC

## 2017-03-06 MED ORDER — LIDOCAINE-PRILOCAINE 2.5-2.5 % EX CREA: TOPICAL_CREAM | CUTANEOUS | 3 refills | Status: DC

## 2017-03-06 NOTE — Assessment & Plan Note (Signed)
Left lumpectomy 09/06/16: IDC grade 2, 3.4 cm, Margins Neg, 0/1 LN neg, T2N0 ER 90%, PR 70%, Ki-67 30%, HER-2 positive ratio 2.43 (stage 2 A)  Treatment Plan: 1. adjuvant chemotherapy TCHPfollowed by Herceptin-Perjetamaintenance to complete 1 year. 2. Adjuvant radiation therapy followed by 3. Adjuvant antiestrogen therapy ----------------------------------------------------------------------------- Current Treatment: Herceptin/Perjeta maintenance (also receiving radiation)  Stacy [name] is doing well today.  Her labs are normal, and she is recovering well from treatment.  She will be finished in a few more weeks with radiation and is managing the fatigue well.   She will proceed with Herceptin/Perjeta today.  She will f/u with Dr. Aundra Dubin on 03/11/17 as scheduled.    Herceptin/Perjeta only in 3 weeks Lab, appt, Herceptin/Perjeta in 6 weeks.

## 2017-03-06 NOTE — Progress Notes (Signed)
[facility] Follow up:    Stacy Fenton, NP [place] [address]   DIAGNOSIS: Cancer Staging Malignant neoplasm of upper-outer quadrant of left female breast [facility]) Staging form: Breast, AJCC 7th Edition - Clinical: No stage assigned - Unsigned - Pathologic stage from 08/29/2016: Stage IIA (T2, N0, cM0) - Unsigned   SUMMARY OF ONCOLOGIC HISTORY:   Malignant neoplasm of upper-outer quadrant of left female breast ([place])   08/21/2016 Initial Diagnosis    Screening detected left breast mass UOQ at 1:00: 1.2 x 1 x 0.5 cm, axilla negative Left breast biopsy 1:00: IDC with DCIS, grade 1, ER 90%, PR 70%, Ki-67 30%, HER-2 positive ratio 2.43, T1c N0 stage IA clinical stage      09/06/2016 Surgery    Left lumpectomy: IDC grade 2, 3.4 cm, Margins Neg, 0/1 LN neg, ER 90%, PR 70%, Ki-67 30%, HER-2 positive ratio 2.43 T2N0 (stage 2 A)      10/09/2016 -  Chemotherapy    TCH Perjeta 6 cycles (Dose reduced due to peripheral neuropathy/neutropenia, last cycle without Taxotere due  followed by Herceptin Perjeta maintenance for 1 year      12/20/2016 Genetic Testing    Genetic counseling and testing for hereditary cancer syndromes performed on 11/22/2016. Results are negative for pathogenic mutations in 46 genes analyzed by [facility]'s Common Hereditary Cancers Panel. Results are dated 12/20/2016. Genes tested: APC, ATM, AXIN2, BARD1, BMPR1A, BRCA1, BRCA2, BRIP1, CDH1, CDKN2A, CHEK2, CTNNA1, DICER1, EPCAM, GREM1, KIT, MEN1, MLH1, MSH2, MSH3, MSH6, MUTYH, NBN, NF1, NTHL1, PALB2, PDGFRA, PMS2, POLD1, POLE, PTEN, RAD50, RAD51C, RAD51D, SDHA, SDHB, SDHC, SDHD, SMAD4, SMARCA4, STK11, TP53, TSC1, TSC2, and VHL.  A variant of uncertain significance (not clinically actionable) was noted in the KIT gene called c.2601C>G (p.Ser867Arg). At this time, it is unknown if this variant is associated with increased cancer risk or if this is a normal finding. It should not be used to  make medical management decisions.      02/13/2017 -  Radiation Therapy    Adjuvant radiation ([place])       CURRENT THERAPY: Herceptin/Perjeta   INTERVAL HISTORY: Stacy [name] 56 y.o. female returns for evaluation prior to receiving her next treatment with maintenance Herceptin and Perjeta.  She is also undergoing radiation with Dr. Sondra Come.  She says she is slightly fatigued from this.  Her peripheral neuropathy from treatment remains stable.  She denies any other issues.  Her labs are improved today.  She has echocardiogram and f/u with Dr. Aundra Dubin on Monday, 03/11/17 and is not having any cardiac concerns today.     Patient Active Problem List   Diagnosis Date Noted  . Genetic testing 12/21/2016  . Simple partial seizure disorder ([place]) 10/18/2016  . Port catheter in place 10/09/2016  . GERD (gastroesophageal reflux disease) 10/09/2016  . Malignant neoplasm of upper-outer quadrant of left female breast ([place]) 08/23/2016  . Episodic altered awareness 08/01/2016  . Facial spasm 08/01/2016  . Right facial numbness 08/01/2016  . Depression 04/20/2016  . Arthritis 06/27/2015  . Insomnia 10/13/2009  . Essential hypertension 04/19/2009  . Fibromyalgia 02/18/2008  . MITRAL VALVE PROLAPSE, HX OF 02/18/2008    is allergic to gabapentin; naproxen; neulasta [pegfilgrastim]; erythromycin; and tramadol hcl.  MEDICAL HISTORY: Past Medical History:  Diagnosis Date  . Anxiety   . Arthritis   . Complication of anesthesia    DIFFICULTY WAKING UP , LAST SURGERY NECK 2012 WAS FINE PER PT HERE  AT [facility]  . Depression   . Fibromyalgia   . Genetic testing 12/21/2016   Genetic testing reported out on 12/20/2016 through [facility]'s 46-gene Common Hereditary Cancers panel found no deleterious mutations. The following 46 genes were analyzed: APC, ATM, AXIN2, BARD1, BMPR1A, BRCA1, BRCA2, BRIP1, CDH1, CDKN2A, CHEK2, CTNNA1, DICER1, EPCAM, GREM1, KIT, MEN1, MLH1, MSH2, MSH3, MSH6, MUTYH, NBN, NF1, NTHL1, PALB2,  PDGFRA, PMS2, POLD1, POLE, PTEN, RAD50, RAD51C, RAD51D, SDH  . Headache    HX MIGRAINES    . Heart murmur   . Hypertension   . Malignant neoplasm of upper-outer quadrant of left female breast ([place]) 08/23/2016  . MVP (mitral valve prolapse)    AS INFANT  . Neck pain   . Vision abnormalities     SURGICAL HISTORY: Past Surgical History:  Procedure Laterality Date  . cervical fusiion  2012  . JOINT REPLACEMENT    . MITRAL VALVE REPAIR     MVP 1970  . PORTACATH PLACEMENT Right 09/06/2016   Procedure: INSERTION PORT-A-CATH;  Surgeon: [name], MD;  Location: [facility];  Service: General;  Laterality: Right;  . RADIOACTIVE SEED GUIDED MASTECTOMY WITH AXILLARY SENTINEL LYMPH NODE BIOPSY Left 09/06/2016   Procedure: RADIOACTIVE SEED GUIDED LEFT BREAST LUMPECTOMY  WITH SENTINEL LYMPH NODE BIOPSY;  Surgeon: [name], MD;  Location: [facility];  Service: General;  Laterality: Left;  . ROTATOR CUFF REPAIR Right 2012  . TOTAL HIP ARTHROPLASTY Right 09/14/2015   Procedure: RIGHT TOTAL HIP ARTHROPLASTY ANTERIOR APPROACH;  Surgeon: Leandrew Koyanagi, MD;  Location: [facility];  Service: Orthopedics;  Laterality: Right;  . TUBAL LIGATION  1988    SOCIAL HISTORY: Social History   Social History  . Marital status: Married    Spouse name: N/A  . Number of children: 4  . Years of education: N/A   Occupational History  . Not on file.   Social History Main Topics  . Smoking status: Former Smoker    Packs/day: 0.25    Types: Cigarettes    Quit date: 01/13/2017  . Smokeless tobacco: Never Used  . Alcohol use No  . Drug use: No  . Sexual activity: Yes    Birth control/ protection: Post-menopausal   Other Topics Concern  . Not on file   Social History Narrative  . No narrative on file    FAMILY HISTORY: Family History  Problem Relation Age of Onset  . Adopted: Yes  . Breast cancer Brother 74       Maternal half-brother. Currently 27.    Review of  Systems  Constitutional: Positive for fatigue. Negative for appetite change, chills, diaphoresis, fever and unexpected weight change.  HENT:   Negative for hearing loss and lump/mass.   Eyes: Negative for eye problems and icterus.  Respiratory: Negative for chest tightness, cough and shortness of breath.   Cardiovascular: Negative for chest pain, leg swelling and palpitations.  Gastrointestinal: Negative for abdominal distention.  Endocrine: Negative for hot flashes.  Genitourinary: Negative for difficulty urinating and dyspareunia.   Musculoskeletal: Negative for arthralgias and back pain.  Skin: Negative for itching and rash.       + nail dyscrasia and brittleness  Neurological: Positive for numbness. Negative for dizziness, extremity weakness, headaches and speech difficulty.  Hematological: Negative for adenopathy. Does not bruise/bleed easily.  Psychiatric/Behavioral: Negative for depression. The patient is not nervous/anxious.       PHYSICAL EXAMINATION  ECOG PERFORMANCE STATUS: 1 - Symptomatic but completely ambulatory  Vitals:  03/06/17 0955  BP: 129/62  Pulse: 74  Resp: 18  Temp: 97.8 F (36.6 C)    Physical Exam  Constitutional: She is oriented to person, place, and time and well-developed, well-nourished, and in no distress.  HENT:  Head: Normocephalic.  Right Ear: External ear normal.  Mouth/Throat: Oropharynx is clear and moist. No oropharyngeal exudate.  Eyes: Pupils are equal, round, and reactive to light. No scleral icterus.  Neck: Neck supple.  Cardiovascular: Normal rate, regular rhythm and normal heart sounds.   Pulmonary/Chest: Effort normal and breath sounds normal.  Abdominal: Soft. Bowel sounds are normal. She exhibits no distension. There is no tenderness.  Musculoskeletal: She exhibits no edema.  Neurological: She is alert and oriented to person, place, and time.  Skin: Skin is warm and dry. No rash noted.  Psychiatric: Mood and affect normal.     LABORATORY DATA:  CBC    Component Value Date/Time   WBC 8.5 03/06/2017 0903   WBC 7.7 01/27/2017 1938   RBC 3.49 (L) 03/06/2017 0903   RBC 3.59 (L) 01/27/2017 1938   HGB 11.7 03/06/2017 0903   HCT 33.3 (L) 03/06/2017 0903   PLT 293 03/06/2017 0903   MCV 95.5 03/06/2017 0903   MCH 33.5 03/06/2017 0903   MCH 31.8 01/27/2017 1938   MCHC 35.1 03/06/2017 0903   MCHC 34.9 01/27/2017 1938   RDW 16.1 (H) 03/06/2017 0903   LYMPHSABS 1.3 03/06/2017 0903   MONOABS 0.8 03/06/2017 0903   EOSABS 0.1 03/06/2017 0903   BASOSABS 0.0 03/06/2017 0903    CMP     Component Value Date/Time   NA 140 03/06/2017 0903   K 3.9 03/06/2017 0903   CL 97 (L) 01/27/2017 1938   CO2 26 03/06/2017 0903   GLUCOSE 87 03/06/2017 0903   BUN 16.1 03/06/2017 0903   CREATININE 1.1 03/06/2017 0903   CALCIUM 9.9 03/06/2017 0903   PROT 7.2 03/06/2017 0903   ALBUMIN 3.7 03/06/2017 0903   AST 15 03/06/2017 0903   ALT 16 03/06/2017 0903   ALKPHOS 71 03/06/2017 0903   BILITOT 0.37 03/06/2017 0903   GFRNONAA >60 01/27/2017 1938   GFRAA >60 01/27/2017 1938       ASSESSMENT and PLAN:   Malignant neoplasm of upper-outer quadrant of left female breast ([place]) Left lumpectomy 09/06/16: IDC grade 2, 3.4 cm, Margins Neg, 0/1 LN neg, T2N0 ER 90%, PR 70%, Ki-67 30%, HER-2 positive ratio 2.43 (stage 2 A)  Treatment Plan: 1. adjuvant chemotherapy TCHPfollowed by Herceptin-Perjetamaintenance to complete 1 year. 2. Adjuvant radiation therapy followed by 3. Adjuvant antiestrogen therapy ----------------------------------------------------------------------------- Current Treatment: Herceptin/Perjeta maintenance (also receiving radiation)  Stacy [name] is doing well today.  Her labs are normal, and she is recovering well from treatment.  She will be finished in a few more weeks with radiation and is managing the fatigue well.   She will proceed with Herceptin/Perjeta today.  She will f/u with Dr. Aundra Dubin on 03/11/17 as  scheduled.    Herceptin/Perjeta only in 3 weeks Lab, appt, Herceptin/Perjeta in 6 weeks.     All questions were answered. The patient knows to call the clinic with any problems, questions or concerns. We can certainly see the patient much sooner if necessary.  A total of (30) minutes of face-to-face time was spent with this patient with greater than 50% of that time in counseling and care-coordination.  This note was electronically signed. Scot Dock, NP 03/06/2017

## 2017-03-06 NOTE — Patient Instructions (Signed)
[facility] Discharge Instructions for Patients Receiving Chemotherapy  Today you received the following chemotherapy agents Herceptin/Perjeta  To help prevent nausea and vomiting after your treatment, we encourage you to take your nausea medication    If you develop nausea and vomiting that is not controlled by your nausea medication, call the clinic.   BELOW ARE SYMPTOMS THAT SHOULD BE REPORTED IMMEDIATELY:  *FEVER GREATER THAN 100.5 F  *CHILLS WITH OR WITHOUT FEVER  NAUSEA AND VOMITING THAT IS NOT CONTROLLED WITH YOUR NAUSEA MEDICATION  *UNUSUAL SHORTNESS OF BREATH  *UNUSUAL BRUISING OR BLEEDING  TENDERNESS IN MOUTH AND THROAT WITH OR WITHOUT PRESENCE OF ULCERS  *URINARY PROBLEMS  *BOWEL PROBLEMS  UNUSUAL RASH Items with * indicate a potential emergency and should be followed up as soon as possible.  Feel free to call the clinic you have any questions or concerns. The clinic phone number is [phone].  Please show the CHEMO ALERT CARD at check-in to the Emergency Department and triage nurse.   

## 2017-03-07 ENCOUNTER — Ambulatory Visit
Admission: RE | Admit: 2017-03-07 | Discharge: 2017-03-07 | Disposition: A | Discharge status: Home / Self Care | Payer: BLUE CROSS/BLUE SHIELD | Source: Ambulatory Visit | Attending: Radiation Oncology | Admitting: Radiation Oncology

## 2017-03-07 DIAGNOSIS — R200 Anesthesia of skin: Secondary | ICD-10-CM | POA: Diagnosis not present

## 2017-03-07 DIAGNOSIS — Z888 Allergy status to other drugs, medicaments and biological substances status: Secondary | ICD-10-CM | POA: Diagnosis not present

## 2017-03-07 DIAGNOSIS — Z510 Encounter for antineoplastic radiation therapy: Secondary | ICD-10-CM | POA: Diagnosis not present

## 2017-03-07 DIAGNOSIS — Z9221 Personal history of antineoplastic chemotherapy: Secondary | ICD-10-CM | POA: Diagnosis not present

## 2017-03-07 DIAGNOSIS — Z170 Estrogen receptor positive status [ER+]: Secondary | ICD-10-CM | POA: Diagnosis not present

## 2017-03-07 DIAGNOSIS — G629 Polyneuropathy, unspecified: Secondary | ICD-10-CM | POA: Diagnosis not present

## 2017-03-07 DIAGNOSIS — C50412 Malignant neoplasm of upper-outer quadrant of left female breast: Secondary | ICD-10-CM | POA: Diagnosis not present

## 2017-03-07 DIAGNOSIS — R05 Cough: Secondary | ICD-10-CM | POA: Diagnosis not present

## 2017-03-07 DIAGNOSIS — Z79899 Other long term (current) drug therapy: Secondary | ICD-10-CM | POA: Diagnosis not present

## 2017-03-08 ENCOUNTER — Ambulatory Visit
Admission: RE | Admit: 2017-03-08 | Discharge: 2017-03-08 | Disposition: A | Discharge status: Home / Self Care | Payer: BLUE CROSS/BLUE SHIELD | Source: Ambulatory Visit | Attending: Radiation Oncology | Admitting: Radiation Oncology

## 2017-03-08 DIAGNOSIS — Z170 Estrogen receptor positive status [ER+]: Secondary | ICD-10-CM | POA: Diagnosis not present

## 2017-03-08 DIAGNOSIS — Z888 Allergy status to other drugs, medicaments and biological substances status: Secondary | ICD-10-CM | POA: Diagnosis not present

## 2017-03-08 DIAGNOSIS — C50412 Malignant neoplasm of upper-outer quadrant of left female breast: Secondary | ICD-10-CM | POA: Diagnosis not present

## 2017-03-08 DIAGNOSIS — Z79899 Other long term (current) drug therapy: Secondary | ICD-10-CM | POA: Diagnosis not present

## 2017-03-08 DIAGNOSIS — Z9221 Personal history of antineoplastic chemotherapy: Secondary | ICD-10-CM | POA: Diagnosis not present

## 2017-03-08 DIAGNOSIS — Z510 Encounter for antineoplastic radiation therapy: Secondary | ICD-10-CM | POA: Diagnosis not present

## 2017-03-08 DIAGNOSIS — R05 Cough: Secondary | ICD-10-CM | POA: Diagnosis not present

## 2017-03-08 DIAGNOSIS — R200 Anesthesia of skin: Secondary | ICD-10-CM | POA: Diagnosis not present

## 2017-03-08 DIAGNOSIS — G629 Polyneuropathy, unspecified: Secondary | ICD-10-CM | POA: Diagnosis not present

## 2017-03-11 ENCOUNTER — Ambulatory Visit: Admission: RE | Admit: 2017-03-11 | Payer: BLUE CROSS/BLUE SHIELD | Source: Ambulatory Visit

## 2017-03-11 ENCOUNTER — Ambulatory Visit (HOSPITAL_COMMUNITY): Admission: RE | Admit: 2017-03-11 | Payer: BLUE CROSS/BLUE SHIELD | Source: Ambulatory Visit

## 2017-03-11 ENCOUNTER — Emergency Department (HOSPITAL_COMMUNITY)
Admission: EM | Admit: 2017-03-11 | Discharge: 2017-03-11 | Disposition: A | Discharge status: Home / Self Care | Payer: BLUE CROSS/BLUE SHIELD | Attending: Emergency Medicine | Admitting: Emergency Medicine

## 2017-03-11 ENCOUNTER — Encounter (HOSPITAL_COMMUNITY): Payer: Self-pay | Admitting: *Deleted

## 2017-03-11 ENCOUNTER — Inpatient Hospital Stay (HOSPITAL_COMMUNITY): Admission: RE | Admit: 2017-03-11 | Payer: BLUE CROSS/BLUE SHIELD | Source: Ambulatory Visit

## 2017-03-11 ENCOUNTER — Emergency Department (HOSPITAL_COMMUNITY): Payer: BLUE CROSS/BLUE SHIELD

## 2017-03-11 DIAGNOSIS — R109 Unspecified abdominal pain: Secondary | ICD-10-CM | POA: Diagnosis not present

## 2017-03-11 DIAGNOSIS — Z853 Personal history of malignant neoplasm of breast: Secondary | ICD-10-CM | POA: Diagnosis not present

## 2017-03-11 DIAGNOSIS — Z170 Estrogen receptor positive status [ER+]: Secondary | ICD-10-CM

## 2017-03-11 DIAGNOSIS — R1110 Vomiting, unspecified: Secondary | ICD-10-CM | POA: Diagnosis not present

## 2017-03-11 DIAGNOSIS — N390 Urinary tract infection, site not specified: Secondary | ICD-10-CM | POA: Diagnosis not present

## 2017-03-11 DIAGNOSIS — R1084 Generalized abdominal pain: Secondary | ICD-10-CM

## 2017-03-11 DIAGNOSIS — I10 Essential (primary) hypertension: Secondary | ICD-10-CM | POA: Diagnosis not present

## 2017-03-11 DIAGNOSIS — Z87891 Personal history of nicotine dependence: Secondary | ICD-10-CM | POA: Insufficient documentation

## 2017-03-11 DIAGNOSIS — Z96641 Presence of right artificial hip joint: Secondary | ICD-10-CM | POA: Insufficient documentation

## 2017-03-11 DIAGNOSIS — C50412 Malignant neoplasm of upper-outer quadrant of left female breast: Secondary | ICD-10-CM

## 2017-03-11 LAB — URINALYSIS, ROUTINE W REFLEX MICROSCOPIC
Bilirubin Urine: NEGATIVE
Glucose, UA: NEGATIVE mg/dL
Ketones, ur: NEGATIVE mg/dL
Nitrite: NEGATIVE
Protein, ur: NEGATIVE mg/dL
Specific Gravity, Urine: 1.01 (ref 1.005–1.030)
pH: 6 (ref 5.0–8.0)

## 2017-03-11 LAB — CBC WITH DIFFERENTIAL/PLATELET
Basophils Absolute: 0 10*3/uL (ref 0.0–0.1)
Basophils Relative: 0 %
EOS ABS: 0 10*3/uL (ref 0.0–0.7)
EOS PCT: 0 %
HCT: 31.7 % — ABNORMAL LOW (ref 36.0–46.0)
Hemoglobin: 11.1 g/dL — ABNORMAL LOW (ref 12.0–15.0)
LYMPHS ABS: 1.2 10*3/uL (ref 0.7–4.0)
Lymphocytes Relative: 11 %
MCH: 32.3 pg (ref 26.0–34.0)
MCHC: 35 g/dL (ref 30.0–36.0)
MCV: 92.2 fL (ref 78.0–100.0)
MONOS PCT: 7 %
Monocytes Absolute: 0.8 10*3/uL (ref 0.1–1.0)
Neutro Abs: 9.2 10*3/uL — ABNORMAL HIGH (ref 1.7–7.7)
Neutrophils Relative %: 82 %
PLATELETS: 223 10*3/uL (ref 150–400)
RBC: 3.44 MIL/uL — AB (ref 3.87–5.11)
RDW: 14.3 % (ref 11.5–15.5)
WBC: 11.3 10*3/uL — AB (ref 4.0–10.5)

## 2017-03-11 LAB — COMPREHENSIVE METABOLIC PANEL WITH GFR
ALT: 12 U/L — ABNORMAL LOW (ref 14–54)
AST: 15 U/L (ref 15–41)
Albumin: 3.7 g/dL (ref 3.5–5.0)
Alkaline Phosphatase: 58 U/L (ref 38–126)
Anion gap: 11 (ref 5–15)
BUN: 21 mg/dL — ABNORMAL HIGH (ref 6–20)
CO2: 25 mmol/L (ref 22–32)
Calcium: 9 mg/dL (ref 8.9–10.3)
Chloride: 94 mmol/L — ABNORMAL LOW (ref 101–111)
Creatinine, Ser: 1.34 mg/dL — ABNORMAL HIGH (ref 0.44–1.00)
GFR calc Af Amer: 51 mL/min — ABNORMAL LOW
GFR calc non Af Amer: 44 mL/min — ABNORMAL LOW
Glucose, Bld: 123 mg/dL — ABNORMAL HIGH (ref 65–99)
Potassium: 3.3 mmol/L — ABNORMAL LOW (ref 3.5–5.1)
Sodium: 130 mmol/L — ABNORMAL LOW (ref 135–145)
Total Bilirubin: 1 mg/dL (ref 0.3–1.2)
Total Protein: 7.6 g/dL (ref 6.5–8.1)

## 2017-03-11 LAB — LIPASE, BLOOD: Lipase: 13 U/L (ref 11–51)

## 2017-03-11 MED ORDER — SODIUM CHLORIDE 0.9 % IV BOLUS (SEPSIS)
1000.0000 mL | Freq: Once | INTRAVENOUS | Status: AC
Start: 2017-03-11 — End: 2017-03-11
  Administered 2017-03-11: 1000 mL via INTRAVENOUS

## 2017-03-11 MED ORDER — DEXTROSE 5 % IV SOLN
1.0000 g | Freq: Once | INTRAVENOUS | Status: AC
  Administered 2017-03-11: 1 g via INTRAVENOUS
  Filled 2017-03-11: qty 10

## 2017-03-11 MED ORDER — IOPAMIDOL (ISOVUE-300) INJECTION 61%
INTRAVENOUS | Status: AC
  Filled 2017-03-11: qty 100

## 2017-03-11 MED ORDER — HYDROMORPHONE HCL 1 MG/ML IJ SOLN
1.0000 mg | Freq: Once | INTRAMUSCULAR | Status: AC
  Administered 2017-03-11: 1 mg via INTRAVENOUS
  Filled 2017-03-11: qty 1

## 2017-03-11 MED ORDER — ONDANSETRON HCL 4 MG/2ML IJ SOLN
4.0000 mg | Freq: Once | INTRAMUSCULAR | Status: AC
  Administered 2017-03-11: 4 mg via INTRAVENOUS
  Filled 2017-03-11: qty 2

## 2017-03-11 MED ORDER — CEPHALEXIN 500 MG PO CAPS
500.0000 mg | ORAL_CAPSULE | Freq: Four times a day (QID) | ORAL | 0 refills | Status: DC
Start: 2017-03-11 — End: 2017-04-17

## 2017-03-11 MED ORDER — SODIUM CHLORIDE 0.9 % IV BOLUS (SEPSIS)
1000.0000 mL | Freq: Once | INTRAVENOUS | Status: AC
  Administered 2017-03-11: 1000 mL via INTRAVENOUS

## 2017-03-11 MED ORDER — IOPAMIDOL (ISOVUE-300) INJECTION 61%
100.0000 mL | Freq: Once | INTRAVENOUS | Status: AC | PRN
  Administered 2017-03-11: 80 mL via INTRAVENOUS

## 2017-03-11 MED ORDER — IOPAMIDOL (ISOVUE-M 300) INJECTION 61%: 15.0000 mL | Freq: Once | INTRAMUSCULAR | Status: DC | PRN

## 2017-03-11 NOTE — ED Provider Notes (Signed)
[facility] DEPT Provider Note   CSN: [id number] Arrival date & time: 03/11/17  1020     History   Chief Complaint Chief Complaint  Patient presents with  . Abdominal Pain    HPI Stacy [name] is a 56 y.o. female.  The history is provided by the patient. No language interpreter was used.  Abdominal Pain   This is a new problem. The current episode started more than 2 days ago. The problem occurs constantly. The problem has been gradually worsening. The pain is located in the generalized abdominal region. The quality of the pain is aching. Associated symptoms include anorexia. Nothing aggravates the symptoms. Nothing relieves the symptoms. Past workup does not include GI consult. Her past medical history does not include irritable bowel syndrome.  Pt has Breast Cancer.  Pt had radiation on Friday.  Pt had Herceptin treatment on Wednesday.  Pt reports she aches all over but feel nauseated and has diffuse abdominal soreness   Past Medical History:  Diagnosis Date  . Anxiety   . Arthritis   . Complication of anesthesia    DIFFICULTY WAKING UP , LAST SURGERY NECK 2012 WAS FINE PER PT HERE AT [facility]  . Depression   . Fibromyalgia   . Genetic testing 12/21/2016   Genetic testing reported out on 12/20/2016 through [facility]'s 46-gene Common Hereditary Cancers panel found no deleterious mutations. The following 46 genes were analyzed: APC, ATM, AXIN2, BARD1, BMPR1A, BRCA1, BRCA2, BRIP1, CDH1, CDKN2A, CHEK2, CTNNA1, DICER1, EPCAM, GREM1, KIT, MEN1, MLH1, MSH2, MSH3, MSH6, MUTYH, NBN, NF1, NTHL1, PALB2, PDGFRA, PMS2, POLD1, POLE, PTEN, RAD50, RAD51C, RAD51D, SDH  . Headache    HX MIGRAINES    . Heart murmur   . Hypertension   . Malignant neoplasm of upper-outer quadrant of left female breast ([place]) 08/23/2016  . MVP (mitral valve prolapse)    AS INFANT  . Neck pain   . Vision abnormalities     Patient Active Problem List   Diagnosis Date Noted  . Genetic testing 12/21/2016  . Simple  partial seizure disorder ([place]) 10/18/2016  . Port catheter in place 10/09/2016  . GERD (gastroesophageal reflux disease) 10/09/2016  . Malignant neoplasm of upper-outer quadrant of left female breast ([place]) 08/23/2016  . Episodic altered awareness 08/01/2016  . Facial spasm 08/01/2016  . Right facial numbness 08/01/2016  . Depression 04/20/2016  . Arthritis 06/27/2015  . Insomnia 10/13/2009  . Essential hypertension 04/19/2009  . Fibromyalgia 02/18/2008  . MITRAL VALVE PROLAPSE, HX OF 02/18/2008    Past Surgical History:  Procedure Laterality Date  . cervical fusiion  2012  . JOINT REPLACEMENT    . MITRAL VALVE REPAIR     MVP 1970  . PORTACATH PLACEMENT Right 09/06/2016   Procedure: INSERTION PORT-A-CATH;  Surgeon: [name], MD;  Location: [place];  Service: General;  Laterality: Right;  . RADIOACTIVE SEED GUIDED MASTECTOMY WITH AXILLARY SENTINEL LYMPH NODE BIOPSY Left 09/06/2016   Procedure: RADIOACTIVE SEED GUIDED LEFT BREAST LUMPECTOMY  WITH SENTINEL LYMPH NODE BIOPSY;  Surgeon: [name], MD;  Location: [place];  Service: General;  Laterality: Left;  . ROTATOR CUFF REPAIR Right 2012  . TOTAL HIP ARTHROPLASTY Right 09/14/2015   Procedure: RIGHT TOTAL HIP ARTHROPLASTY ANTERIOR APPROACH;  Surgeon: Leandrew Koyanagi, MD;  Location: [facility];  Service: Orthopedics;  Laterality: Right;  . TUBAL LIGATION  1988    OB History    No data available       Home  Medications    Prior to Admission medications   Medication Sig Start Date End Date Taking? Authorizing Provider  hyaluronate sodium (RADIAPLEXRX) GEL Apply 1 application topically 2 (two) times daily.    [provider]  hydrochlorothiazide (HYDRODIURIL) 25 MG tablet TAKE 1 TABLET BY MOUTH EVERY DAY 04/27/16   [name], NP  ibuprofen (ADVIL,MOTRIN) 200 MG tablet Take 400 mg by mouth every 4 (four) hours as needed for headache, mild pain or moderate pain.     [provider]  levocetirizine (XYZAL) 5 MG tablet Take 5 mg by mouth as needed for allergies.    [provider]  lidocaine-prilocaine (EMLA) cream Apply to affected area once 03/06/17   Causey, Charlestine Massed, NP  LORazepam (ATIVAN) 0.5 MG tablet Take 1 tablet (0.5 mg total) by mouth every 6 (six) hours as needed (Nausea or vomiting). Patient not taking: Reported on 02/19/2017 01/29/17   Nicholas Lose, MD  non-metallic deodorant Jethro Poling) MISC Apply 1 application topically daily as needed.    [provider]  ondansetron (ZOFRAN) 8 MG tablet TAKE 1 TABLET BY MOUTH TWICE DAILY AS NEEDED FOR REFRACTORY NAUSEA AND VOMITING. START ON DAY 3 AFTER CHEMO Patient not taking: Reported on 02/19/2017 01/23/17   Gardenia Phlegm, NP  pantoprazole (PROTONIX) 40 MG tablet Take 1 tablet (40 mg total) by mouth daily. 10/16/16   Nicholas Lose, MD  prochlorperazine (COMPAZINE) 10 MG tablet Take 1 tablet (10 mg total) by mouth every 6 (six) hours as needed (Nausea or vomiting). Patient not taking: Reported on 02/19/2017 01/23/17   Gardenia Phlegm, NP    Family History Family History  Problem Relation Age of Onset  . Adopted: Yes  . Breast cancer Brother 51       Maternal half-brother. Currently 8.    Social History Social History  Substance Use Topics  . Smoking status: Former Smoker    Packs/day: 0.25    Types: Cigarettes    Quit date: 01/13/2017  . Smokeless tobacco: Never Used  . Alcohol use No     Allergies   Gabapentin; Naproxen; Neulasta [pegfilgrastim]; Erythromycin; and Tramadol hcl   Review of Systems Review of Systems  Gastrointestinal: Positive for abdominal pain and anorexia.  All other systems reviewed and are negative.    Physical Exam Updated Vital Signs BP 127/78   Pulse (!) 110   Temp 98.7 F (37.1 C) (Oral)   Resp 20   SpO2 95%   Physical Exam  Constitutional: She appears well-developed and well-nourished.  HENT:  Head: Normocephalic.    Cardiovascular: Normal rate and regular rhythm.   Pulmonary/Chest: Effort normal.  Abdominal: Soft.  Musculoskeletal: Normal range of motion.  Neurological: She is alert.  Skin: Skin is warm.  Psychiatric: She has a normal mood and affect.  Nursing note and vitals reviewed.    ED Treatments / Results  Labs (all labs ordered are listed, but only abnormal results are displayed) Labs Reviewed  CBC WITH DIFFERENTIAL/PLATELET - Abnormal; Notable for the following:       Result Value   WBC 11.3 (*)    RBC 3.44 (*)    Hemoglobin 11.1 (*)    HCT 31.7 (*)    Neutro Abs 9.2 (*)    All other components within normal limits  COMPREHENSIVE METABOLIC PANEL - Abnormal; Notable for the following:    Sodium 130 (*)    Potassium 3.3 (*)    Chloride 94 (*)    Glucose, Bld 123 (*)  BUN 21 (*)    Creatinine, Ser 1.34 (*)    ALT 12 (*)    GFR calc non Af Amer 44 (*)    GFR calc Af Amer 51 (*)    All other components within normal limits  LIPASE, BLOOD  COMPREHENSIVE METABOLIC PANEL    EKG  EKG Interpretation None       Radiology No results found.  Procedures Procedures (including critical care time)  Medications Ordered in ED Medications  iopamidol (ISOVUE-300) 61 % injection (not administered)  iopamidol (ISOVUE-M) 61 % intrathecal injection 15 mL (not administered)  sodium chloride 0.9 % bolus 1,000 mL (1,000 mLs Intravenous New Bag/Given 03/11/17 1154)  ondansetron (ZOFRAN) injection 4 mg (4 mg Intravenous Given 03/11/17 1154)  HYDROmorphone (DILAUDID) injection 1 mg (1 mg Intravenous Given 03/11/17 1154)  iopamidol (ISOVUE-300) 61 % injection 100 mL (80 mLs Intravenous Contrast Given 03/11/17 1311)     Initial Impression / Assessment and Plan / ED Course  I have reviewed the triage vital signs and the nursing notes.  Pertinent labs & imaging results that were available during my care of the patient were reviewed by me and considered in my medical decision making (see  chart for details).     Pt given iv fluids x 2 liters, Rocephin 1 gram Iv.   Pt reports feeling better.  Pt advised to recheck in Oncology clinic ([facility] ) tomorrow.   Final Clinical Impressions(s) / ED Diagnoses   Final diagnoses:  Generalized abdominal pain  Urinary tract infection without hematuria, site unspecified    New Prescriptions New Prescriptions   No medications on file     Sidney Ace 03/11/17 1531    Virgel Manifold, MD 03/12/17 [phone]

## 2017-03-11 NOTE — Discharge Instructions (Signed)
Return if any problems.  Antibiotics as directed.  See your Physician for recheck

## 2017-03-11 NOTE — ED Triage Notes (Signed)
Patient is alert and oriented x4.  She is complaining of abdominal pain with nausea and vomiting along with generalized body aches that have been on going since last Friday with her last cancer treatment on friday.  Patient states that this morning morning she had a temp of 102.6F at home and took ibuprofen.  Currently she does not have a temp on arrival to the ED.

## 2017-03-12 ENCOUNTER — Ambulatory Visit
Admission: RE | Admit: 2017-03-12 | Discharge: 2017-03-12 | Disposition: A | Discharge status: Home / Self Care | Payer: BLUE CROSS/BLUE SHIELD | Source: Ambulatory Visit | Attending: Radiation Oncology | Admitting: Radiation Oncology

## 2017-03-12 DIAGNOSIS — Z79899 Other long term (current) drug therapy: Secondary | ICD-10-CM | POA: Diagnosis not present

## 2017-03-12 DIAGNOSIS — G629 Polyneuropathy, unspecified: Secondary | ICD-10-CM | POA: Diagnosis not present

## 2017-03-12 DIAGNOSIS — Z510 Encounter for antineoplastic radiation therapy: Secondary | ICD-10-CM | POA: Diagnosis not present

## 2017-03-12 DIAGNOSIS — R200 Anesthesia of skin: Secondary | ICD-10-CM | POA: Diagnosis not present

## 2017-03-12 DIAGNOSIS — Z9221 Personal history of antineoplastic chemotherapy: Secondary | ICD-10-CM | POA: Diagnosis not present

## 2017-03-12 DIAGNOSIS — R05 Cough: Secondary | ICD-10-CM | POA: Diagnosis not present

## 2017-03-12 DIAGNOSIS — Z888 Allergy status to other drugs, medicaments and biological substances status: Secondary | ICD-10-CM | POA: Diagnosis not present

## 2017-03-12 DIAGNOSIS — C50412 Malignant neoplasm of upper-outer quadrant of left female breast: Secondary | ICD-10-CM | POA: Diagnosis not present

## 2017-03-12 DIAGNOSIS — Z170 Estrogen receptor positive status [ER+]: Secondary | ICD-10-CM | POA: Diagnosis not present

## 2017-03-13 ENCOUNTER — Ambulatory Visit
Admission: RE | Admit: 2017-03-13 | Discharge: 2017-03-13 | Disposition: A | Discharge status: Home / Self Care | Payer: BLUE CROSS/BLUE SHIELD | Source: Ambulatory Visit | Attending: Radiation Oncology | Admitting: Radiation Oncology

## 2017-03-13 DIAGNOSIS — Z170 Estrogen receptor positive status [ER+]: Secondary | ICD-10-CM | POA: Diagnosis not present

## 2017-03-13 DIAGNOSIS — R200 Anesthesia of skin: Secondary | ICD-10-CM | POA: Diagnosis not present

## 2017-03-13 DIAGNOSIS — Z79899 Other long term (current) drug therapy: Secondary | ICD-10-CM | POA: Diagnosis not present

## 2017-03-13 DIAGNOSIS — C50412 Malignant neoplasm of upper-outer quadrant of left female breast: Secondary | ICD-10-CM | POA: Diagnosis not present

## 2017-03-13 DIAGNOSIS — Z510 Encounter for antineoplastic radiation therapy: Secondary | ICD-10-CM | POA: Diagnosis not present

## 2017-03-13 DIAGNOSIS — R05 Cough: Secondary | ICD-10-CM | POA: Diagnosis not present

## 2017-03-13 DIAGNOSIS — Z9221 Personal history of antineoplastic chemotherapy: Secondary | ICD-10-CM | POA: Diagnosis not present

## 2017-03-13 DIAGNOSIS — G629 Polyneuropathy, unspecified: Secondary | ICD-10-CM | POA: Diagnosis not present

## 2017-03-13 DIAGNOSIS — Z888 Allergy status to other drugs, medicaments and biological substances status: Secondary | ICD-10-CM | POA: Diagnosis not present

## 2017-03-14 ENCOUNTER — Ambulatory Visit
Admission: RE | Admit: 2017-03-14 | Discharge: 2017-03-14 | Disposition: A | Discharge status: Home / Self Care | Payer: BLUE CROSS/BLUE SHIELD | Source: Ambulatory Visit | Attending: Radiation Oncology | Admitting: Radiation Oncology

## 2017-03-14 DIAGNOSIS — C50412 Malignant neoplasm of upper-outer quadrant of left female breast: Secondary | ICD-10-CM | POA: Diagnosis not present

## 2017-03-14 DIAGNOSIS — R05 Cough: Secondary | ICD-10-CM | POA: Diagnosis not present

## 2017-03-14 DIAGNOSIS — Z79899 Other long term (current) drug therapy: Secondary | ICD-10-CM | POA: Diagnosis not present

## 2017-03-14 DIAGNOSIS — Z888 Allergy status to other drugs, medicaments and biological substances status: Secondary | ICD-10-CM | POA: Diagnosis not present

## 2017-03-14 DIAGNOSIS — G629 Polyneuropathy, unspecified: Secondary | ICD-10-CM | POA: Diagnosis not present

## 2017-03-14 DIAGNOSIS — Z170 Estrogen receptor positive status [ER+]: Secondary | ICD-10-CM | POA: Diagnosis not present

## 2017-03-14 DIAGNOSIS — Z9221 Personal history of antineoplastic chemotherapy: Secondary | ICD-10-CM | POA: Diagnosis not present

## 2017-03-14 DIAGNOSIS — Z510 Encounter for antineoplastic radiation therapy: Secondary | ICD-10-CM | POA: Diagnosis not present

## 2017-03-14 DIAGNOSIS — R200 Anesthesia of skin: Secondary | ICD-10-CM | POA: Diagnosis not present

## 2017-03-15 ENCOUNTER — Ambulatory Visit
Admission: RE | Admit: 2017-03-15 | Discharge: 2017-03-15 | Disposition: A | Discharge status: Home / Self Care | Payer: BLUE CROSS/BLUE SHIELD | Source: Ambulatory Visit | Attending: Radiation Oncology | Admitting: Radiation Oncology

## 2017-03-15 DIAGNOSIS — Z170 Estrogen receptor positive status [ER+]: Secondary | ICD-10-CM | POA: Diagnosis not present

## 2017-03-15 DIAGNOSIS — Z510 Encounter for antineoplastic radiation therapy: Secondary | ICD-10-CM | POA: Diagnosis not present

## 2017-03-15 DIAGNOSIS — G629 Polyneuropathy, unspecified: Secondary | ICD-10-CM | POA: Diagnosis not present

## 2017-03-15 DIAGNOSIS — C50412 Malignant neoplasm of upper-outer quadrant of left female breast: Secondary | ICD-10-CM | POA: Diagnosis not present

## 2017-03-15 DIAGNOSIS — Z888 Allergy status to other drugs, medicaments and biological substances status: Secondary | ICD-10-CM | POA: Diagnosis not present

## 2017-03-15 DIAGNOSIS — Z79899 Other long term (current) drug therapy: Secondary | ICD-10-CM | POA: Diagnosis not present

## 2017-03-15 DIAGNOSIS — Z9221 Personal history of antineoplastic chemotherapy: Secondary | ICD-10-CM | POA: Diagnosis not present

## 2017-03-15 DIAGNOSIS — R200 Anesthesia of skin: Secondary | ICD-10-CM | POA: Diagnosis not present

## 2017-03-15 DIAGNOSIS — R05 Cough: Secondary | ICD-10-CM | POA: Diagnosis not present

## 2017-03-18 ENCOUNTER — Ambulatory Visit
Admission: RE | Admit: 2017-03-18 | Discharge: 2017-03-18 | Disposition: A | Discharge status: Home / Self Care | Payer: BLUE CROSS/BLUE SHIELD | Source: Ambulatory Visit | Attending: Radiation Oncology | Admitting: Radiation Oncology

## 2017-03-18 DIAGNOSIS — Z888 Allergy status to other drugs, medicaments and biological substances status: Secondary | ICD-10-CM | POA: Diagnosis not present

## 2017-03-18 DIAGNOSIS — G629 Polyneuropathy, unspecified: Secondary | ICD-10-CM | POA: Diagnosis not present

## 2017-03-18 DIAGNOSIS — Z9221 Personal history of antineoplastic chemotherapy: Secondary | ICD-10-CM | POA: Diagnosis not present

## 2017-03-18 DIAGNOSIS — R200 Anesthesia of skin: Secondary | ICD-10-CM | POA: Diagnosis not present

## 2017-03-18 DIAGNOSIS — Z79899 Other long term (current) drug therapy: Secondary | ICD-10-CM | POA: Diagnosis not present

## 2017-03-18 DIAGNOSIS — C50412 Malignant neoplasm of upper-outer quadrant of left female breast: Secondary | ICD-10-CM | POA: Diagnosis not present

## 2017-03-18 DIAGNOSIS — R05 Cough: Secondary | ICD-10-CM | POA: Diagnosis not present

## 2017-03-18 DIAGNOSIS — Z170 Estrogen receptor positive status [ER+]: Secondary | ICD-10-CM | POA: Diagnosis not present

## 2017-03-18 DIAGNOSIS — Z510 Encounter for antineoplastic radiation therapy: Secondary | ICD-10-CM | POA: Diagnosis not present

## 2017-03-19 ENCOUNTER — Ambulatory Visit
Admission: RE | Admit: 2017-03-19 | Discharge: 2017-03-19 | Disposition: A | Discharge status: Home / Self Care | Payer: BLUE CROSS/BLUE SHIELD | Source: Ambulatory Visit | Attending: Radiation Oncology | Admitting: Radiation Oncology

## 2017-03-19 DIAGNOSIS — C50412 Malignant neoplasm of upper-outer quadrant of left female breast: Secondary | ICD-10-CM | POA: Diagnosis not present

## 2017-03-19 DIAGNOSIS — Z9221 Personal history of antineoplastic chemotherapy: Secondary | ICD-10-CM | POA: Diagnosis not present

## 2017-03-19 DIAGNOSIS — R200 Anesthesia of skin: Secondary | ICD-10-CM | POA: Diagnosis not present

## 2017-03-19 DIAGNOSIS — G629 Polyneuropathy, unspecified: Secondary | ICD-10-CM | POA: Diagnosis not present

## 2017-03-19 DIAGNOSIS — R05 Cough: Secondary | ICD-10-CM | POA: Diagnosis not present

## 2017-03-19 DIAGNOSIS — Z79899 Other long term (current) drug therapy: Secondary | ICD-10-CM | POA: Diagnosis not present

## 2017-03-19 DIAGNOSIS — Z170 Estrogen receptor positive status [ER+]: Principal | ICD-10-CM

## 2017-03-19 DIAGNOSIS — Z510 Encounter for antineoplastic radiation therapy: Secondary | ICD-10-CM | POA: Diagnosis not present

## 2017-03-19 DIAGNOSIS — Z888 Allergy status to other drugs, medicaments and biological substances status: Secondary | ICD-10-CM | POA: Diagnosis not present

## 2017-03-19 MED ORDER — RADIAPLEXRX EX GEL
Freq: Once | CUTANEOUS | Status: AC
  Administered 2017-03-19: 09:00:00 via TOPICAL

## 2017-03-21 ENCOUNTER — Ambulatory Visit
Admission: RE | Admit: 2017-03-21 | Discharge: 2017-03-21 | Disposition: A | Discharge status: Home / Self Care | Payer: BLUE CROSS/BLUE SHIELD | Source: Ambulatory Visit | Attending: Radiation Oncology | Admitting: Radiation Oncology

## 2017-03-21 DIAGNOSIS — Z170 Estrogen receptor positive status [ER+]: Secondary | ICD-10-CM | POA: Diagnosis not present

## 2017-03-21 DIAGNOSIS — Z510 Encounter for antineoplastic radiation therapy: Secondary | ICD-10-CM | POA: Diagnosis not present

## 2017-03-21 DIAGNOSIS — R200 Anesthesia of skin: Secondary | ICD-10-CM | POA: Diagnosis not present

## 2017-03-21 DIAGNOSIS — C50412 Malignant neoplasm of upper-outer quadrant of left female breast: Secondary | ICD-10-CM | POA: Diagnosis not present

## 2017-03-21 DIAGNOSIS — Z9221 Personal history of antineoplastic chemotherapy: Secondary | ICD-10-CM | POA: Diagnosis not present

## 2017-03-21 DIAGNOSIS — Z888 Allergy status to other drugs, medicaments and biological substances status: Secondary | ICD-10-CM | POA: Diagnosis not present

## 2017-03-21 DIAGNOSIS — Z79899 Other long term (current) drug therapy: Secondary | ICD-10-CM | POA: Diagnosis not present

## 2017-03-21 DIAGNOSIS — G629 Polyneuropathy, unspecified: Secondary | ICD-10-CM | POA: Diagnosis not present

## 2017-03-21 DIAGNOSIS — R05 Cough: Secondary | ICD-10-CM | POA: Diagnosis not present

## 2017-03-22 ENCOUNTER — Ambulatory Visit
Admission: RE | Admit: 2017-03-22 | Discharge: 2017-03-22 | Disposition: A | Discharge status: Home / Self Care | Payer: BLUE CROSS/BLUE SHIELD | Source: Ambulatory Visit | Attending: Radiation Oncology | Admitting: Radiation Oncology

## 2017-03-22 DIAGNOSIS — C50412 Malignant neoplasm of upper-outer quadrant of left female breast: Secondary | ICD-10-CM | POA: Diagnosis not present

## 2017-03-22 DIAGNOSIS — Z510 Encounter for antineoplastic radiation therapy: Secondary | ICD-10-CM | POA: Diagnosis not present

## 2017-03-22 DIAGNOSIS — R200 Anesthesia of skin: Secondary | ICD-10-CM | POA: Diagnosis not present

## 2017-03-22 DIAGNOSIS — Z9221 Personal history of antineoplastic chemotherapy: Secondary | ICD-10-CM | POA: Diagnosis not present

## 2017-03-22 DIAGNOSIS — Z170 Estrogen receptor positive status [ER+]: Secondary | ICD-10-CM | POA: Diagnosis not present

## 2017-03-22 DIAGNOSIS — G629 Polyneuropathy, unspecified: Secondary | ICD-10-CM | POA: Diagnosis not present

## 2017-03-22 DIAGNOSIS — Z888 Allergy status to other drugs, medicaments and biological substances status: Secondary | ICD-10-CM | POA: Diagnosis not present

## 2017-03-22 DIAGNOSIS — R05 Cough: Secondary | ICD-10-CM | POA: Diagnosis not present

## 2017-03-22 DIAGNOSIS — Z79899 Other long term (current) drug therapy: Secondary | ICD-10-CM | POA: Diagnosis not present

## 2017-03-25 ENCOUNTER — Ambulatory Visit
Admission: RE | Admit: 2017-03-25 | Discharge: 2017-03-25 | Disposition: A | Discharge status: Home / Self Care | Payer: BLUE CROSS/BLUE SHIELD | Source: Ambulatory Visit | Attending: Radiation Oncology | Admitting: Radiation Oncology

## 2017-03-25 DIAGNOSIS — Z888 Allergy status to other drugs, medicaments and biological substances status: Secondary | ICD-10-CM | POA: Diagnosis not present

## 2017-03-25 DIAGNOSIS — Z510 Encounter for antineoplastic radiation therapy: Secondary | ICD-10-CM | POA: Diagnosis not present

## 2017-03-25 DIAGNOSIS — R200 Anesthesia of skin: Secondary | ICD-10-CM | POA: Diagnosis not present

## 2017-03-25 DIAGNOSIS — C50412 Malignant neoplasm of upper-outer quadrant of left female breast: Secondary | ICD-10-CM | POA: Diagnosis not present

## 2017-03-25 DIAGNOSIS — G629 Polyneuropathy, unspecified: Secondary | ICD-10-CM | POA: Diagnosis not present

## 2017-03-25 DIAGNOSIS — R05 Cough: Secondary | ICD-10-CM | POA: Diagnosis not present

## 2017-03-25 DIAGNOSIS — Z9221 Personal history of antineoplastic chemotherapy: Secondary | ICD-10-CM | POA: Diagnosis not present

## 2017-03-25 DIAGNOSIS — Z170 Estrogen receptor positive status [ER+]: Secondary | ICD-10-CM | POA: Diagnosis not present

## 2017-03-25 DIAGNOSIS — Z79899 Other long term (current) drug therapy: Secondary | ICD-10-CM | POA: Diagnosis not present

## 2017-03-26 ENCOUNTER — Ambulatory Visit: Payer: BLUE CROSS/BLUE SHIELD

## 2017-03-26 ENCOUNTER — Ambulatory Visit
Admission: RE | Admit: 2017-03-26 | Discharge: 2017-03-26 | Disposition: A | Discharge status: Home / Self Care | Payer: BLUE CROSS/BLUE SHIELD | Source: Ambulatory Visit | Attending: Radiation Oncology | Admitting: Radiation Oncology

## 2017-03-26 DIAGNOSIS — G629 Polyneuropathy, unspecified: Secondary | ICD-10-CM | POA: Diagnosis not present

## 2017-03-26 DIAGNOSIS — R200 Anesthesia of skin: Secondary | ICD-10-CM | POA: Diagnosis not present

## 2017-03-26 DIAGNOSIS — Z170 Estrogen receptor positive status [ER+]: Secondary | ICD-10-CM | POA: Diagnosis not present

## 2017-03-26 DIAGNOSIS — Z888 Allergy status to other drugs, medicaments and biological substances status: Secondary | ICD-10-CM | POA: Diagnosis not present

## 2017-03-26 DIAGNOSIS — Z510 Encounter for antineoplastic radiation therapy: Secondary | ICD-10-CM | POA: Diagnosis not present

## 2017-03-26 DIAGNOSIS — R05 Cough: Secondary | ICD-10-CM | POA: Diagnosis not present

## 2017-03-26 DIAGNOSIS — Z79899 Other long term (current) drug therapy: Secondary | ICD-10-CM | POA: Diagnosis not present

## 2017-03-26 DIAGNOSIS — Z9221 Personal history of antineoplastic chemotherapy: Secondary | ICD-10-CM | POA: Diagnosis not present

## 2017-03-26 DIAGNOSIS — C50412 Malignant neoplasm of upper-outer quadrant of left female breast: Secondary | ICD-10-CM | POA: Diagnosis not present

## 2017-03-27 ENCOUNTER — Ambulatory Visit
Admission: RE | Admit: 2017-03-27 | Discharge: 2017-03-27 | Disposition: A | Discharge status: Home / Self Care | Payer: BLUE CROSS/BLUE SHIELD | Source: Ambulatory Visit | Attending: Radiation Oncology | Admitting: Radiation Oncology

## 2017-03-27 ENCOUNTER — Ambulatory Visit: Payer: BLUE CROSS/BLUE SHIELD

## 2017-03-27 ENCOUNTER — Ambulatory Visit (HOSPITAL_BASED_OUTPATIENT_CLINIC_OR_DEPARTMENT_OTHER): Payer: BLUE CROSS/BLUE SHIELD

## 2017-03-27 ENCOUNTER — Other Ambulatory Visit: Payer: Self-pay

## 2017-03-27 VITALS — BP 139/91 | HR 78 | Temp 97.5°F | Resp 18

## 2017-03-27 DIAGNOSIS — C50412 Malignant neoplasm of upper-outer quadrant of left female breast: Secondary | ICD-10-CM

## 2017-03-27 DIAGNOSIS — Z510 Encounter for antineoplastic radiation therapy: Secondary | ICD-10-CM | POA: Diagnosis not present

## 2017-03-27 DIAGNOSIS — R05 Cough: Secondary | ICD-10-CM | POA: Diagnosis not present

## 2017-03-27 DIAGNOSIS — R200 Anesthesia of skin: Secondary | ICD-10-CM | POA: Diagnosis not present

## 2017-03-27 DIAGNOSIS — Z9221 Personal history of antineoplastic chemotherapy: Secondary | ICD-10-CM | POA: Diagnosis not present

## 2017-03-27 DIAGNOSIS — Z79899 Other long term (current) drug therapy: Secondary | ICD-10-CM | POA: Diagnosis not present

## 2017-03-27 DIAGNOSIS — Z170 Estrogen receptor positive status [ER+]: Principal | ICD-10-CM

## 2017-03-27 DIAGNOSIS — G629 Polyneuropathy, unspecified: Secondary | ICD-10-CM | POA: Diagnosis not present

## 2017-03-27 DIAGNOSIS — Z5112 Encounter for antineoplastic immunotherapy: Secondary | ICD-10-CM

## 2017-03-27 DIAGNOSIS — Z888 Allergy status to other drugs, medicaments and biological substances status: Secondary | ICD-10-CM | POA: Diagnosis not present

## 2017-03-27 DIAGNOSIS — Z5181 Encounter for therapeutic drug level monitoring: Secondary | ICD-10-CM

## 2017-03-27 MED ORDER — SODIUM CHLORIDE 0.9 % IV SOLN
Freq: Once | INTRAVENOUS | Status: AC
  Administered 2017-03-27: 10:00:00 via INTRAVENOUS

## 2017-03-27 MED ORDER — HEPARIN SOD (PORK) LOCK FLUSH 100 UNIT/ML IV SOLN
500.0000 [IU] | Freq: Once | INTRAVENOUS | Status: AC | PRN
Start: 2017-03-27 — End: 2017-03-27
  Administered 2017-03-27: 500 [IU]
  Filled 2017-03-27: qty 5

## 2017-03-27 MED ORDER — PERTUZUMAB CHEMO INJECTION 420 MG/14ML
420.0000 mg | Freq: Once | INTRAVENOUS | Status: AC
  Administered 2017-03-27: 420 mg via INTRAVENOUS
  Filled 2017-03-27: qty 14

## 2017-03-27 MED ORDER — SODIUM CHLORIDE 0.9 % IV SOLN
6.0000 mg/kg | Freq: Once | INTRAVENOUS | Status: AC
  Administered 2017-03-27: 630 mg via INTRAVENOUS
  Filled 2017-03-27: qty 30

## 2017-03-27 MED ORDER — ACETAMINOPHEN 325 MG PO TABS
650.0000 mg | ORAL_TABLET | Freq: Once | ORAL | Status: AC
  Administered 2017-03-27: 650 mg via ORAL

## 2017-03-27 MED ORDER — DIPHENHYDRAMINE HCL 25 MG PO CAPS
50.0000 mg | ORAL_CAPSULE | Freq: Once | ORAL | Status: AC
  Administered 2017-03-27: 50 mg via ORAL

## 2017-03-27 MED ORDER — SODIUM CHLORIDE 0.9% FLUSH
10.0000 mL | INTRAVENOUS | Status: DC | PRN
  Administered 2017-03-27: 10 mL
  Filled 2017-03-27: qty 10

## 2017-03-27 NOTE — Progress Notes (Signed)
Pt states that her neuropathy has gotten worse, pt states the cramping and pain has gotten worse. Pt difficult to count change, can do zippers, states she "struggles with buttons".   Pt reports a new "drawing up pain" left ribcage just under the breast. Started @ 1 week ago. Pt states nothing brings on the pain, "comes out of the blue" pt states "it doubles me over". Pt states the pain goes away on its own in 1-2 minutes. Pt states "it takes her breath away". Pt takes ibuprofen daily.   Notified May desk RN. Per Dr Lindi Adie ok to tx today. May RN is rescheduling new Echo, will call pt. Pt is aware.  May desk RN talked to pt on phone while in tx area. Pt to do PT for neuropathy. Pt is aware and verbalized understanding.

## 2017-03-27 NOTE — Progress Notes (Signed)
Received call from infusion RN regarding pt complaints of Grade 2-3 neuropathy since last chemo. Pt states that she is having a really hard time buttoning shirts and needs to let Dr.Gudena know about her symptoms. Pt unable to take nerve pain medication due to allergies to neurontin. Per Dr.Gudena, send for referral to physical therapy to help manage neuropathy. Pt also would like to reschedule her echo since she missed her appt due to being in the ED for UTI. Will call to reschedule this for pt. Okay to proceed with treatment today per Dr.Gudena. Pt grateful and appreciates the time. Will call pt with updates.

## 2017-03-27 NOTE — Patient Instructions (Signed)
[facility] Discharge Instructions for Patients Receiving Chemotherapy  Today you received the following chemotherapy agents Herceptin/Perjeta  To help prevent nausea and vomiting after your treatment, we encourage you to take your nausea medication    If you develop nausea and vomiting that is not controlled by your nausea medication, call the clinic.   BELOW ARE SYMPTOMS THAT SHOULD BE REPORTED IMMEDIATELY:  *FEVER GREATER THAN 100.5 F  *CHILLS WITH OR WITHOUT FEVER  NAUSEA AND VOMITING THAT IS NOT CONTROLLED WITH YOUR NAUSEA MEDICATION  *UNUSUAL SHORTNESS OF BREATH  *UNUSUAL BRUISING OR BLEEDING  TENDERNESS IN MOUTH AND THROAT WITH OR WITHOUT PRESENCE OF ULCERS  *URINARY PROBLEMS  *BOWEL PROBLEMS  UNUSUAL RASH Items with * indicate a potential emergency and should be followed up as soon as possible.  Feel free to call the clinic you have any questions or concerns. The clinic phone number is [phone].  Please show the CHEMO ALERT CARD at check-in to the Emergency Department and triage nurse.   

## 2017-03-28 ENCOUNTER — Ambulatory Visit
Admission: RE | Admit: 2017-03-28 | Discharge: 2017-03-28 | Disposition: A | Discharge status: Home / Self Care | Payer: BLUE CROSS/BLUE SHIELD | Source: Ambulatory Visit | Attending: Radiation Oncology | Admitting: Radiation Oncology

## 2017-03-28 ENCOUNTER — Ambulatory Visit: Payer: BLUE CROSS/BLUE SHIELD

## 2017-03-28 DIAGNOSIS — R05 Cough: Secondary | ICD-10-CM | POA: Diagnosis not present

## 2017-03-28 DIAGNOSIS — Z79899 Other long term (current) drug therapy: Secondary | ICD-10-CM | POA: Diagnosis not present

## 2017-03-28 DIAGNOSIS — Z9221 Personal history of antineoplastic chemotherapy: Secondary | ICD-10-CM | POA: Diagnosis not present

## 2017-03-28 DIAGNOSIS — Z170 Estrogen receptor positive status [ER+]: Secondary | ICD-10-CM | POA: Diagnosis not present

## 2017-03-28 DIAGNOSIS — Z888 Allergy status to other drugs, medicaments and biological substances status: Secondary | ICD-10-CM | POA: Diagnosis not present

## 2017-03-28 DIAGNOSIS — C50412 Malignant neoplasm of upper-outer quadrant of left female breast: Secondary | ICD-10-CM | POA: Diagnosis not present

## 2017-03-28 DIAGNOSIS — R200 Anesthesia of skin: Secondary | ICD-10-CM | POA: Diagnosis not present

## 2017-03-28 DIAGNOSIS — G629 Polyneuropathy, unspecified: Secondary | ICD-10-CM | POA: Diagnosis not present

## 2017-03-28 DIAGNOSIS — Z510 Encounter for antineoplastic radiation therapy: Secondary | ICD-10-CM | POA: Diagnosis not present

## 2017-03-29 ENCOUNTER — Ambulatory Visit
Admission: RE | Admit: 2017-03-29 | Discharge: 2017-03-29 | Disposition: A | Discharge status: Home / Self Care | Payer: BLUE CROSS/BLUE SHIELD | Source: Ambulatory Visit | Attending: Radiation Oncology | Admitting: Radiation Oncology

## 2017-03-29 DIAGNOSIS — C50412 Malignant neoplasm of upper-outer quadrant of left female breast: Secondary | ICD-10-CM | POA: Diagnosis not present

## 2017-03-29 DIAGNOSIS — Z170 Estrogen receptor positive status [ER+]: Secondary | ICD-10-CM | POA: Diagnosis not present

## 2017-03-29 DIAGNOSIS — G629 Polyneuropathy, unspecified: Secondary | ICD-10-CM | POA: Diagnosis not present

## 2017-03-29 DIAGNOSIS — Z510 Encounter for antineoplastic radiation therapy: Secondary | ICD-10-CM | POA: Diagnosis not present

## 2017-03-29 DIAGNOSIS — R200 Anesthesia of skin: Secondary | ICD-10-CM | POA: Diagnosis not present

## 2017-03-29 DIAGNOSIS — Z9221 Personal history of antineoplastic chemotherapy: Secondary | ICD-10-CM | POA: Diagnosis not present

## 2017-03-29 DIAGNOSIS — Z79899 Other long term (current) drug therapy: Secondary | ICD-10-CM | POA: Diagnosis not present

## 2017-03-29 DIAGNOSIS — Z888 Allergy status to other drugs, medicaments and biological substances status: Secondary | ICD-10-CM | POA: Diagnosis not present

## 2017-03-29 DIAGNOSIS — R05 Cough: Secondary | ICD-10-CM | POA: Diagnosis not present

## 2017-04-01 ENCOUNTER — Ambulatory Visit
Admission: RE | Admit: 2017-04-01 | Discharge: 2017-04-01 | Disposition: A | Discharge status: Home / Self Care | Payer: BLUE CROSS/BLUE SHIELD | Source: Ambulatory Visit | Attending: Radiation Oncology | Admitting: Radiation Oncology

## 2017-04-01 DIAGNOSIS — R05 Cough: Secondary | ICD-10-CM | POA: Diagnosis not present

## 2017-04-01 DIAGNOSIS — G629 Polyneuropathy, unspecified: Secondary | ICD-10-CM | POA: Diagnosis not present

## 2017-04-01 DIAGNOSIS — Z170 Estrogen receptor positive status [ER+]: Secondary | ICD-10-CM | POA: Diagnosis not present

## 2017-04-01 DIAGNOSIS — R200 Anesthesia of skin: Secondary | ICD-10-CM | POA: Diagnosis not present

## 2017-04-01 DIAGNOSIS — Z79899 Other long term (current) drug therapy: Secondary | ICD-10-CM | POA: Diagnosis not present

## 2017-04-01 DIAGNOSIS — Z888 Allergy status to other drugs, medicaments and biological substances status: Secondary | ICD-10-CM | POA: Diagnosis not present

## 2017-04-01 DIAGNOSIS — Z9221 Personal history of antineoplastic chemotherapy: Secondary | ICD-10-CM | POA: Diagnosis not present

## 2017-04-01 DIAGNOSIS — C50412 Malignant neoplasm of upper-outer quadrant of left female breast: Secondary | ICD-10-CM | POA: Diagnosis not present

## 2017-04-01 DIAGNOSIS — Z510 Encounter for antineoplastic radiation therapy: Secondary | ICD-10-CM | POA: Diagnosis not present

## 2017-04-02 ENCOUNTER — Ambulatory Visit: Payer: BLUE CROSS/BLUE SHIELD

## 2017-04-02 ENCOUNTER — Ambulatory Visit
Admission: RE | Admit: 2017-04-02 | Discharge: 2017-04-02 | Disposition: A | Discharge status: Home / Self Care | Payer: BLUE CROSS/BLUE SHIELD | Source: Ambulatory Visit | Attending: Radiation Oncology | Admitting: Radiation Oncology

## 2017-04-02 DIAGNOSIS — R200 Anesthesia of skin: Secondary | ICD-10-CM | POA: Diagnosis not present

## 2017-04-02 DIAGNOSIS — Z79899 Other long term (current) drug therapy: Secondary | ICD-10-CM | POA: Diagnosis not present

## 2017-04-02 DIAGNOSIS — R05 Cough: Secondary | ICD-10-CM | POA: Diagnosis not present

## 2017-04-02 DIAGNOSIS — Z510 Encounter for antineoplastic radiation therapy: Secondary | ICD-10-CM | POA: Diagnosis not present

## 2017-04-02 DIAGNOSIS — Z9221 Personal history of antineoplastic chemotherapy: Secondary | ICD-10-CM | POA: Diagnosis not present

## 2017-04-02 DIAGNOSIS — G629 Polyneuropathy, unspecified: Secondary | ICD-10-CM | POA: Diagnosis not present

## 2017-04-02 DIAGNOSIS — Z170 Estrogen receptor positive status [ER+]: Secondary | ICD-10-CM | POA: Diagnosis not present

## 2017-04-02 DIAGNOSIS — C50412 Malignant neoplasm of upper-outer quadrant of left female breast: Secondary | ICD-10-CM | POA: Diagnosis not present

## 2017-04-02 DIAGNOSIS — Z888 Allergy status to other drugs, medicaments and biological substances status: Secondary | ICD-10-CM | POA: Diagnosis not present

## 2017-04-03 ENCOUNTER — Encounter: Payer: Self-pay | Admitting: Radiation Oncology

## 2017-04-03 ENCOUNTER — Ambulatory Visit
Admission: RE | Admit: 2017-04-03 | Discharge: 2017-04-03 | Disposition: A | Discharge status: Home / Self Care | Payer: BLUE CROSS/BLUE SHIELD | Source: Ambulatory Visit | Attending: Radiation Oncology | Admitting: Radiation Oncology

## 2017-04-03 ENCOUNTER — Ambulatory Visit: Payer: BLUE CROSS/BLUE SHIELD | Admitting: Physical Therapy

## 2017-04-03 DIAGNOSIS — R200 Anesthesia of skin: Secondary | ICD-10-CM | POA: Diagnosis not present

## 2017-04-03 DIAGNOSIS — R05 Cough: Secondary | ICD-10-CM | POA: Diagnosis not present

## 2017-04-03 DIAGNOSIS — Z170 Estrogen receptor positive status [ER+]: Secondary | ICD-10-CM | POA: Diagnosis not present

## 2017-04-03 DIAGNOSIS — Z888 Allergy status to other drugs, medicaments and biological substances status: Secondary | ICD-10-CM | POA: Diagnosis not present

## 2017-04-03 DIAGNOSIS — Z9221 Personal history of antineoplastic chemotherapy: Secondary | ICD-10-CM | POA: Diagnosis not present

## 2017-04-03 DIAGNOSIS — G629 Polyneuropathy, unspecified: Secondary | ICD-10-CM | POA: Diagnosis not present

## 2017-04-03 DIAGNOSIS — Z79899 Other long term (current) drug therapy: Secondary | ICD-10-CM | POA: Diagnosis not present

## 2017-04-03 DIAGNOSIS — Z510 Encounter for antineoplastic radiation therapy: Secondary | ICD-10-CM | POA: Diagnosis not present

## 2017-04-03 DIAGNOSIS — C50412 Malignant neoplasm of upper-outer quadrant of left female breast: Secondary | ICD-10-CM | POA: Diagnosis not present

## 2017-04-03 NOTE — Progress Notes (Signed)
  Radiation Oncology         [phone] ________________________________  Name: Stacy [name] MRN: [id number]  Date: 04/03/2017  DOB: [birth year]  End of Treatment Note  Diagnosis:   56 y.o. woman with pathological Stage II-A Left Breast UOQ Invasive Ductal Carcinoma (T2, N0), ER+ / PR+ / Her2+, Grade 2  Indication for treatment:  Curative       Radiation treatment dates:   02/14/2017 - 04/03/2017  Site/dose:   The left breast was treated to 60.4 Gy, with a primary dose of 50.4 Gy delivered in 28 fractions of 1.8 Gy and a boost dose of 10 Gy delivered in 5 fractions of 2 Gy.  Beams/energy:    Left Breast: 3D / 10X, 6X Photon Left Breast Boost: Isodose Plan / 6X, 10X Photon  Narrative: The patient tolerated radiation treatment relatively well. She reported having severe fatigue. She denied having any pain, except for some occasional, intermittent "squeezing/knotting" pain under her left breast, which was relieved by Ibuprofen. She reported skin irritation from the blisters and peeling under her left breast. Her skin was noted to be dry and hyperpigmented. She was given RadiaPlex for her skin irritation and reported using Neosporin for the blisters under her left breast. She had no appetite changes.   Plan: The patient has completed radiation treatment. The patient will return to radiation oncology clinic for routine followup in one month. I advised them to call or return sooner if they have any questions or concerns related to their recovery or treatment.  -----------------------------------  Blair Promise, PhD, MD  This document serves as a record of services personally performed by Gery Pray, MD. It was created on his behalf by Rae Lips, a trained medical scribe. The creation of this record is based on the scribe's personal observations and the provider's statements to them. This document has been checked and approved by the attending provider.

## 2017-04-09 ENCOUNTER — Ambulatory Visit (HOSPITAL_COMMUNITY)
Admission: RE | Admit: 2017-04-09 | Discharge: 2017-04-09 | Disposition: A | Discharge status: Home / Self Care | Payer: BLUE CROSS/BLUE SHIELD | Source: Ambulatory Visit | Attending: Internal Medicine | Admitting: Internal Medicine

## 2017-04-09 DIAGNOSIS — I061 Rheumatic aortic insufficiency: Secondary | ICD-10-CM | POA: Diagnosis not present

## 2017-04-09 DIAGNOSIS — Z5181 Encounter for therapeutic drug level monitoring: Secondary | ICD-10-CM | POA: Diagnosis not present

## 2017-04-09 NOTE — Progress Notes (Signed)
  Echocardiogram 2D Echocardiogram has been performed.  Stacy [name] 04/09/2017, 4:12 PM

## 2017-04-10 ENCOUNTER — Encounter: Payer: Self-pay | Admitting: Physical Therapy

## 2017-04-10 ENCOUNTER — Ambulatory Visit: Payer: BLUE CROSS/BLUE SHIELD | Attending: Hematology and Oncology | Admitting: Physical Therapy

## 2017-04-10 DIAGNOSIS — R262 Difficulty in walking, not elsewhere classified: Secondary | ICD-10-CM | POA: Insufficient documentation

## 2017-04-10 DIAGNOSIS — M6281 Muscle weakness (generalized): Secondary | ICD-10-CM | POA: Diagnosis not present

## 2017-04-10 DIAGNOSIS — R208 Other disturbances of skin sensation: Secondary | ICD-10-CM | POA: Insufficient documentation

## 2017-04-10 NOTE — Therapy (Addendum)
[facility] [address] [name], [place], [address] Phone: [phone]   Fax:  [phone]  Physical Therapy Evaluation  Patient Details  Name: [name] MRN: [id number] Date of Birth: 01/16/1961 Referring Provider: Gudena  Encounter Date: 04/10/2017      PT End of Session - 04/10/17 1651    Visit Number 1   Number of Visits 5   Date for PT Re-Evaluation 05/08/17   PT Start Time 1601   PT Stop Time 1637  pt did not want to complete treatment today   PT Time Calculation (min) 36 min   Activity Tolerance Patient tolerated treatment well   Behavior During Therapy WFL for tasks assessed/performed      Past Medical History:  Diagnosis Date  . Anxiety   . Arthritis   . Complication of anesthesia    DIFFICULTY WAKING UP , LAST SURGERY NECK 2012 WAS FINE PER PT HERE AT [facility]  . Depression   . Fibromyalgia   . Genetic testing 12/21/2016   Genetic testing reported out on 12/20/2016 through [facility]'s 46-gene Common Hereditary Cancers panel found no deleterious mutations. The following 46 genes were analyzed: APC, ATM, AXIN2, BARD1, BMPR1A, BRCA1, BRCA2, BRIP1, CDH1, CDKN2A, CHEK2, CTNNA1, DICER1, EPCAM, GREM1, KIT, MEN1, MLH1, MSH2, MSH3, MSH6, MUTYH, NBN, NF1, NTHL1, PALB2, PDGFRA, PMS2, POLD1, POLE, PTEN, RAD50, RAD51C, RAD51D, SDH  . Headache    HX MIGRAINES    . Heart murmur   . Hypertension   . Malignant neoplasm of upper-outer quadrant of left female breast (HCC) 08/23/2016  . MVP (mitral valve prolapse)    AS INFANT  . Neck pain   . Vision abnormalities     Past Surgical History:  Procedure Laterality Date  . cervical fusiion  2012  . JOINT REPLACEMENT    . MITRAL VALVE REPAIR     MVP 1970  . PORTACATH PLACEMENT Right 09/06/2016   Procedure: INSERTION PORT-A-CATH;  Surgeon: Faera Byerly, MD;  Location: [facility];  Service: General;  Laterality: Right;  . RADIOACTIVE SEED GUIDED MASTECTOMY WITH  AXILLARY SENTINEL LYMPH NODE BIOPSY Left 09/06/2016   Procedure: RADIOACTIVE SEED GUIDED LEFT BREAST LUMPECTOMY  WITH SENTINEL LYMPH NODE BIOPSY;  Surgeon: Faera Byerly, MD;  Location: [facility];  Service: General;  Laterality: Left;  . ROTATOR CUFF REPAIR Right 2012  . TOTAL HIP ARTHROPLASTY Right 09/14/2015   Procedure: RIGHT TOTAL HIP ARTHROPLASTY ANTERIOR APPROACH;  Surgeon: Naiping M Xu, MD;  Location: [facility];  Service: Orthopedics;  Laterality: Right;  . TUBAL LIGATION  1988    There were no vitals filed for this visit.       Subjective Assessment - 04/10/17 1606    Subjective I have neuropathy in my hands and feet. It has gotten worse with cramping. My hands and feet with just draw up and I have to rub them. Walking long distances just is not possible. I finished chemo on Jan 23, 2017, I started having symptoms after the 3 treatment and I had 6.  I feel like jello sometimes. I feel really weak. I have tingling in my hands and feet. I have not tried much for the neuropathy. I can not take gabapentin. I do massage when it cramps.    Pertinent History Left breast cancer with lumpectomy, SLNB and Portacath placed 09/06/16.  Pt has completed chemo and radiation.  HTN controlled with meds.  Right total hip replacement 09/14/15 which she reports was anterior approach and   has no current restrictions.  Mitral valve prolapse with heart valve repair in 1970 or so; still has a heart murmur.  Neck fusion 2012 by Dr. Roy, she thinks C4-5-6. Fibromyalgia--doesn't tolerate meds for this except ibuprofen; affected by stress and weather changes.   Patient Stated Goals to be more mobile and have less pain   Currently in Pain? Yes   Pain Score --  pt unable to rate the tingling   Pain Location Hand  feet   Pain Orientation Left;Right   Pain Descriptors / Indicators Tingling   Pain Type Chronic pain   Pain Onset More than a month ago   Pain Frequency Constant   Aggravating Factors  a lot  of walking   Pain Relieving Factors pt does not know   Effect of Pain on Daily Activities limits mobility            [facility] PT Assessment - 04/10/17 0001      Assessment   Medical Diagnosis left breast cancer, s/p lumpectomy with SLNB   Referring Provider Gudena   Onset Date/Surgical Date 09/06/16   Hand Dominance Right   Prior Therapy 1 visit of PT in Jan for education on lymphedema     Precautions   Precautions Other (comment)  at risk for lymphedema   Precaution Comments --     Restrictions   Weight Bearing Restrictions No     Balance Screen   Has the patient fallen in the past 6 months No   Has the patient had a decrease in activity level because of a fear of falling?  No   Is the patient reluctant to leave their home because of a fear of falling?  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   Type of Home Mobile home   Home Access Stairs to enter     Prior Function   Level of Independence Independent  sometimes needs assistance opening jars and bottles   Vocation Full time employment   Vocation Requirements works at a bank, mostly desk work   Leisure no regular exercise     Cognition   Overall Cognitive Status Within Functional Limits for tasks assessed     Observation/Other Assessments   Observations --     Posture/Postural Control   Posture/Postural Control Postural limitations   Postural Limitations Forward head     ROM / Strength   AROM / PROM / Strength AROM;Strength     AROM   Overall AROM  Within functional limits for tasks performed   Right Shoulder Flexion --   Right Shoulder ABduction --   Right Shoulder Internal Rotation --   Right Shoulder External Rotation --   Left Shoulder Flexion --   Left Shoulder ABduction --   Left Shoulder Internal Rotation --   Left Shoulder External Rotation --     Strength   Overall Strength  Deficits  on L shoulder- flex 4/5, abd 3+/5, R shldr gross 4/5     Ambulation/Gait   Ambulation/Gait Yes   Ambulation/Gait Assistance 7: Independent  but pt demonstrates decreased foot clearance            LYMPHEDEMA/ONCOLOGY QUESTIONNAIRE - 04/10/17 1621      Type   Cancer Type left breast     Surgeries   Lumpectomy Date 09/06/16   Sentinel Lymph Node Biopsy Date 09/06/16   Number Lymph Nodes Removed   1     Treatment   Active Chemotherapy Treatment No   Date --   Past Chemotherapy Treatment Yes   Active Radiation Treatment No   Past Radiation Treatment Yes   Current Hormone Treatment No  pt to begin at some point   Past Hormone Therapy No     What other symptoms do you have   Are you Having Heaviness or Tightness No   Are you having Pain Yes  from neuropathy   Are you having pitting edema No   Is it Hard or Difficult finding clothes that fit No   Do you have infections No   Is there Decreased scar mobility No     Lymphedema Assessments   Lymphedema Assessments Upper extremities     Right Upper Extremity Lymphedema   15 cm Proximal to Olecranon Process 35.2 cm   10 cm Proximal to Olecranon Process 32.2 cm   Olecranon Process 28.7 cm   10 cm Proximal to Ulnar Styloid Process 23 cm   Just Proximal to Ulnar Styloid Process 17.5 cm   Across Hand at [employer] 19.6 cm   At [place] of 2nd Digit 6.7 cm     Left Upper Extremity Lymphedema   15 cm Proximal to Olecranon Process 35.7 cm   10 cm Proximal to Olecranon Process 32.9 cm   Olecranon Process 28.6 cm   10 cm Proximal to Ulnar Styloid Process 23.5 cm   Just Proximal to Ulnar Styloid Process 17 cm   Across Hand at [employer] 20.5 cm   At [place] of 2nd Digit 6.5 cm         Objective measurements completed on examination: See above findings.                  PT Education - 04/10/17 1650    Education provided Yes   Education Details lymphedema risk reduction practices   Person(s)  Educated Patient   Methods Explanation;Handout   Comprehension Verbalized understanding                [facility] Goals - 04/10/17 1652      [facility] Long Term Goal  #1   Title Pt to be able to independently verbalize lymphedema risk reduction practices   Time 4   Period Weeks   Status New   Target Date 05/08/17     CC Long Term Goal  #2   Title Pt to report a 50% improvement in hand and feet cramping secondary to neuropathy to allow improved comfort.    Time 4   Period Weeks   Status New   Target Date 05/08/17     CC Long Term Goal  #3   Title Pt to be independent in an all over strengthening home exercise program to decrease weakness   Time 4   Period Weeks   Status New   Target Date 05/08/17     CC Long Term Goal  #4   Title Pt to demonstrate gross left shoulder strength of 4+/5 to allow pt to return to prior level of function   Time 4   Period Weeks   Status New   Target Date 05/08/17             Plan - 04/10/17 1640    Clinical Impression Statement Pt is s/p left lumpectomy and SLNB on 09/06/16 for left breast cancer. She has completed chemotherapy and radiation. During chemotherapy she developed peripheral neuropathy in bilateral  hands and feet. She states she tends not to pick her feet up now when she walks which is new since prior to treatment. She also reports her hands and feet cramp and she has to massage them to relieve the pain. It is limiting her mobility and she is unable to walk for long distances. She also reports she feels like "jello" at times. Her left shoulder is considerably weaker than her right and pt had pain upon manual muscle testing. She currently does not demonstrate any signs of swelling. 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 Pt is tearful when reporting this. Pt would benefit from skilled PT services to decrease pain associated with neuropathy, increase all over strength and especially strength of  left shoulder to improve pt's mobility and ability to care for her husband.    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   Clinical Presentation Stable   Clinical Presentation due to: pt has finished treatments   Clinical Decision Making Low   Rehab Potential Good   Clinical Impairments Affecting Rehab Potential pt may have trouble coming due to work schedule   PT Frequency 1x / week   PT Duration 4 weeks   PT Treatment/Interventions ADLs/Self Care Home Management;Therapeutic exercise;Therapeutic activities;Moist Heat;Electrical Stimulation;Cryotherapy;Patient/family education;Manual techniques   PT Next Visit Plan ankle and LE strengthening for neuropathy, give Kearney Holz artice emailed by Donna about neuropathy as education, add e - stim to feet- pt does have titanium in neck and hip replacement   Consulted and Agree with Plan of Care Patient      Patient will benefit from skilled therapeutic intervention in order to improve the following deficits and impairments:  Pain, Decreased knowledge of precautions, Decreased knowledge of use of DME, Decreased strength, Impaired sensation, Postural dysfunction  Visit Diagnosis: Muscle weakness (generalized) - Plan: PT plan of care cert/re-cert  Other disturbances of skin sensation - Plan: PT plan of care cert/re-cert  Difficulty in walking, not elsewhere classified - Plan: PT plan of care cert/re-cert     Problem List Patient Active Problem List   Diagnosis Date Noted  . Genetic testing 12/21/2016  . Simple partial seizure disorder (HCC) 10/18/2016  . Port catheter in place 10/09/2016  . GERD (gastroesophageal reflux disease) 10/09/2016  . Malignant neoplasm of upper-outer quadrant of left female breast (HCC) 08/23/2016  . Episodic altered awareness 08/01/2016  . Facial spasm 08/01/2016  . Right facial numbness 08/01/2016  . Depression 04/20/2016  . Arthritis 06/27/2015  .  Insomnia 10/13/2009  . Essential hypertension 04/19/2009  . Fibromyalgia 02/18/2008  . MITRAL VALVE PROLAPSE, HX OF 02/18/2008    Rees Matura Breedlove Blue 04/10/2017, 4:57 PM  [facility] [address] , [place], [address] Phone: [phone]   Fax:  [phone]  Name: Lasundra D Matus MRN: [id number] Date of Birth: 06/11/1961  Brittain Hosie Breedlove Blue, PT 04/10/17 4:57 PM  PHYSICAL THERAPY DISCHARGE SUMMARY  Visits from Start of Care: 1  Current functional level related to goals / functional outcomes: See above Remaining deficits: See above   Education / Equipment: See above Plan: Patient agrees to discharge.  Patient goals were not met. Patient is being discharged due to not returning since the last visit.  ?????    Makyah Lavigne Breedlove Blue, PT 10/14/17 10:33 AM  

## 2017-04-16 ENCOUNTER — Other Ambulatory Visit: Payer: Self-pay | Admitting: Emergency Medicine

## 2017-04-16 DIAGNOSIS — Z170 Estrogen receptor positive status [ER+]: Principal | ICD-10-CM

## 2017-04-16 DIAGNOSIS — C50412 Malignant neoplasm of upper-outer quadrant of left female breast: Secondary | ICD-10-CM

## 2017-04-17 ENCOUNTER — Ambulatory Visit: Payer: BLUE CROSS/BLUE SHIELD

## 2017-04-17 ENCOUNTER — Ambulatory Visit: Payer: BLUE CROSS/BLUE SHIELD | Admitting: Neurology

## 2017-04-17 ENCOUNTER — Other Ambulatory Visit (HOSPITAL_BASED_OUTPATIENT_CLINIC_OR_DEPARTMENT_OTHER): Payer: BLUE CROSS/BLUE SHIELD

## 2017-04-17 ENCOUNTER — Ambulatory Visit (HOSPITAL_BASED_OUTPATIENT_CLINIC_OR_DEPARTMENT_OTHER): Payer: BLUE CROSS/BLUE SHIELD

## 2017-04-17 ENCOUNTER — Ambulatory Visit (HOSPITAL_BASED_OUTPATIENT_CLINIC_OR_DEPARTMENT_OTHER): Payer: BLUE CROSS/BLUE SHIELD | Admitting: Adult Health

## 2017-04-17 ENCOUNTER — Encounter: Payer: Self-pay | Admitting: Adult Health

## 2017-04-17 VITALS — BP 139/79 | HR 74 | Temp 98.3°F | Resp 18 | Ht 64.0 in | Wt 213.5 lb

## 2017-04-17 DIAGNOSIS — C50412 Malignant neoplasm of upper-outer quadrant of left female breast: Secondary | ICD-10-CM | POA: Diagnosis not present

## 2017-04-17 DIAGNOSIS — Z170 Estrogen receptor positive status [ER+]: Secondary | ICD-10-CM

## 2017-04-17 DIAGNOSIS — Z79810 Long term (current) use of selective estrogen receptor modulators (SERMs): Secondary | ICD-10-CM

## 2017-04-17 DIAGNOSIS — Z5112 Encounter for antineoplastic immunotherapy: Secondary | ICD-10-CM | POA: Diagnosis not present

## 2017-04-17 LAB — COMPREHENSIVE METABOLIC PANEL
ALBUMIN: 3.8 g/dL (ref 3.5–5.0)
ALT: 16 U/L (ref 0–55)
AST: 17 U/L (ref 5–34)
Alkaline Phosphatase: 74 U/L (ref 40–150)
Anion Gap: 10 mEq/L (ref 3–11)
BUN: 13 mg/dL (ref 7.0–26.0)
CALCIUM: 9.7 mg/dL (ref 8.4–10.4)
CO2: 25 meq/L (ref 22–29)
CREATININE: 1 mg/dL (ref 0.6–1.1)
Chloride: 104 mEq/L (ref 98–109)
EGFR: 67 mL/min/{1.73_m2} — ABNORMAL LOW (ref >90)
Glucose: 97 mg/dl (ref 70–140)
Potassium: 4 mEq/L (ref 3.5–5.1)
Sodium: 138 mEq/L (ref 136–145)
TOTAL PROTEIN: 7.3 g/dL (ref 6.4–8.3)
Total Bilirubin: 0.42 mg/dL (ref 0.20–1.20)

## 2017-04-17 LAB — CBC WITH DIFFERENTIAL/PLATELET
BASO%: 0.1 % (ref 0.0–2.0)
BASOS ABS: 0 10*3/uL (ref 0.0–0.1)
EOS ABS: 0.1 10*3/uL (ref 0.0–0.5)
EOS%: 1.7 % (ref 0.0–7.0)
HEMATOCRIT: 34.1 % — AB (ref 34.8–46.6)
HEMOGLOBIN: 11.4 g/dL — AB (ref 11.6–15.9)
LYMPH#: 1.2 10*3/uL (ref 0.9–3.3)
LYMPH%: 17.2 % (ref 14.0–49.7)
MCH: 30.8 pg (ref 25.1–34.0)
MCHC: 33.4 g/dL (ref 31.5–36.0)
MCV: 92.2 fL (ref 79.5–101.0)
MONO#: 0.6 10*3/uL (ref 0.1–0.9)
MONO%: 7.9 % (ref 0.0–14.0)
NEUT#: 5.1 10*3/uL (ref 1.5–6.5)
NEUT%: 73.1 % (ref 38.4–76.8)
Platelets: 236 10*3/uL (ref 145–400)
RBC: 3.7 10*6/uL (ref 3.70–5.45)
RDW: 13.7 % (ref 11.2–14.5)
WBC: 6.9 10*3/uL (ref 3.9–10.3)

## 2017-04-17 MED ORDER — DIPHENHYDRAMINE HCL 25 MG PO CAPS
ORAL_CAPSULE | ORAL | Status: AC
  Filled 2017-04-17: qty 1

## 2017-04-17 MED ORDER — SODIUM CHLORIDE 0.9% FLUSH
10.0000 mL | INTRAVENOUS | Status: DC | PRN
  Administered 2017-04-17: 10 mL
  Filled 2017-04-17: qty 10

## 2017-04-17 MED ORDER — ACETAMINOPHEN 325 MG PO TABS
650.0000 mg | ORAL_TABLET | Freq: Once | ORAL | Status: AC
  Administered 2017-04-17: 650 mg via ORAL

## 2017-04-17 MED ORDER — HEPARIN SOD (PORK) LOCK FLUSH 100 UNIT/ML IV SOLN
500.0000 [IU] | Freq: Once | INTRAVENOUS | Status: AC | PRN
  Administered 2017-04-17: 500 [IU]
  Filled 2017-04-17: qty 5

## 2017-04-17 MED ORDER — TAMOXIFEN CITRATE 20 MG PO TABS: 20.0000 mg | ORAL_TABLET | Freq: Every day | ORAL | 5 refills | Status: DC

## 2017-04-17 MED ORDER — ACETAMINOPHEN 325 MG PO TABS
ORAL_TABLET | ORAL | Status: AC
  Filled 2017-04-17: qty 1

## 2017-04-17 MED ORDER — SODIUM CHLORIDE 0.9 % IV SOLN
420.0000 mg | Freq: Once | INTRAVENOUS | Status: AC
  Administered 2017-04-17: 420 mg via INTRAVENOUS
  Filled 2017-04-17: qty 14

## 2017-04-17 MED ORDER — TRASTUZUMAB CHEMO 150 MG IV SOLR
600.0000 mg | Freq: Once | INTRAVENOUS | Status: AC
  Administered 2017-04-17: 600 mg via INTRAVENOUS
  Filled 2017-04-17: qty 28.57

## 2017-04-17 MED ORDER — ACETAMINOPHEN 325 MG PO TABS
ORAL_TABLET | ORAL | Status: AC
  Filled 2017-04-17: qty 2

## 2017-04-17 MED ORDER — SODIUM CHLORIDE 0.9 % IV SOLN
Freq: Once | INTRAVENOUS | Status: AC
  Administered 2017-04-17: 09:00:00 via INTRAVENOUS

## 2017-04-17 MED ORDER — DIPHENHYDRAMINE HCL 25 MG PO CAPS
50.0000 mg | ORAL_CAPSULE | Freq: Once | ORAL | Status: AC
Start: 2017-04-17 — End: 2017-04-17
  Administered 2017-04-17: 50 mg via ORAL

## 2017-04-17 MED ORDER — SODIUM CHLORIDE 0.9% FLUSH
10.0000 mL | INTRAVENOUS | Status: DC | PRN
  Administered 2017-04-17: 10 mL via INTRAVENOUS
  Filled 2017-04-17: qty 10

## 2017-04-17 NOTE — Patient Instructions (Signed)
Tamoxifen oral tablet What is this medicine? TAMOXIFEN (ta MOX i fen) blocks the effects of estrogen. It is commonly used to treat breast cancer. It is also used to decrease the chance of breast cancer coming back in women who have received treatment for the disease. It may also help prevent breast cancer in women who have a high risk of developing breast cancer. This medicine may be used for other purposes; ask your health care provider or pharmacist if you have questions. COMMON BRAND NAME(S): Nolvadex What should I tell my health care provider before I take this medicine? They need to know if you have any of these conditions: -blood clots -blood disease -cataracts or impaired eyesight -endometriosis -high calcium levels -high cholesterol -irregular menstrual cycles -liver disease -stroke -uterine fibroids -an unusual reaction to tamoxifen, other medicines, foods, dyes, or preservatives -pregnant or trying to get pregnant -breast-feeding How should I use this medicine? Take this medicine by mouth with a glass of water. Follow the directions on the prescription label. You can take it with or without food. Take your medicine at regular intervals. Do not take your medicine more often than directed. Do not stop taking except on your doctor's advice. A special MedGuide will be given to you by the pharmacist with each prescription and refill. Be sure to read this information carefully each time. Talk to your pediatrician regarding the use of this medicine in children. While this drug may be prescribed for selected conditions, precautions do apply. Overdosage: If you think you have taken too much of this medicine contact a poison control center or emergency room at once. NOTE: This medicine is only for you. Do not share this medicine with others. What if I miss a dose? If you miss a dose, take it as soon as you can. If it is almost time for your next dose, take only that dose. Do not take  double or extra doses. What may interact with this medicine? Do not take this medicine with any of the following medications: -cisapride -certain medicines for irregular heart beat like dofetilide, dronedarone, quinidine -certain medicines for fungal infection like fluconazole, posaconazole -pimozide -saquinavir -thioridazine This medicine may also interact with the following medications: -aminoglutethimide -anastrozole -bromocriptine -chemotherapy drugs -female hormones, like estrogens and birth control pills -letrozole -medroxyprogesterone -phenobarbital -rifampin -warfarin This list may not describe all possible interactions. Give your health care provider a list of all the medicines, herbs, non-prescription drugs, or dietary supplements you use. Also tell them if you smoke, drink alcohol, or use illegal drugs. Some items may interact with your medicine. What should I watch for while using this medicine? Visit your doctor or health care professional for regular checks on your progress. You will need regular pelvic exams, breast exams, and mammograms. If you are taking this medicine to reduce your risk of getting breast cancer, you should know that this medicine does not prevent all types of breast cancer. If breast cancer or other problems occur, there is no guarantee that it will be found at an early stage. Do not become pregnant while taking this medicine or for 2 months after stopping this medicine. Stop taking this medicine if you get pregnant or think you are pregnant and contact your doctor. This medicine may harm your unborn baby. Women who can possibly become pregnant should use birth control methods that do not use hormones during tamoxifen treatment and for 2 months after therapy has stopped. Talk with your health care provider for birth control advice.  are pregnant and contact your doctor. This medicine may harm your unborn baby. Women who can possibly become pregnant should use birth control methods that do not use hormones during tamoxifen treatment and for 2 months after therapy has stopped. Talk with your health care provider for birth control advice.  Do not breast feed while taking this medicine.  What side effects may I notice from receiving this medicine?  Side effects that  you should report to your doctor or health care professional as soon as possible:  -allergic reactions like skin rash, itching or hives, swelling of the face, lips, or tongue  -changes in vision  -changes in your menstrual cycle  -difficulty walking or talking  -new breast lumps  -numbness  -pelvic pain or pressure  -redness, blistering, peeling or loosening of the skin, including inside the mouth  -signs and symptoms of a dangerous change in heartbeat or heart rhythm like chest pain, dizziness, fast or irregular heartbeat, palpitations, feeling faint or lightheaded, falls, breathing problems  -sudden chest pain  -swelling, pain or tenderness in your calf or leg  -unusual bruising or bleeding  -vaginal discharge that is bloody, [place], or rust  -weakness  -yellowing of the whites of the eyes or skin  Side effects that usually do not require medical attention (report to your doctor or health care professional if they continue or are bothersome):  -fatigue  -hair loss, although uncommon and is usually mild  -headache  -hot flashes  -impotence (in men)  -nausea, vomiting (mild)  -vaginal discharge (white or clear)  This list may not describe all possible side effects. Call your doctor for medical advice about side effects. You may report side effects to FDA at 1-800-FDA-1088.  Where should I keep my medicine?  Keep out of the reach of children.  Store at room temperature between 20 and 25 degrees C (68 and 77 degrees F). Protect from light. Keep container tightly closed. Throw away any unused medicine after the expiration date.  NOTE: This sheet is a summary. It may not cover all possible information. If you have questions about this medicine, talk to your doctor, pharmacist, or health care provider.   2018 Elsevier/Gold Standard (2016-03-23 07:27:41)

## 2017-04-17 NOTE — Assessment & Plan Note (Addendum)
Left lumpectomy 09/06/16: IDC grade 2, 3.4 cm, Margins Neg, 0/1 LN neg, T2N0 ER 90%, PR 70%, Ki-67 30%, HER-2 positive ratio 2.43 (stage 2 A)  Treatment Plan: 1. adjuvant chemotherapy TCHPfollowed by Herceptin-Perjetamaintenance to complete 1 year. 2. Adjuvant radiation therapycompleted 04/03/2017 3. Adjuvant antiestrogen therapy with Tamoxifen starting in 04/2017 ----------------------------------------------------------------------------- Current Treatment: Herceptin/Perjeta maintenance, starting Tamoxifen  Elenna is doing well today.  Her last echo on 7/24 was normal.  She will proceed with Herceptin/Perjeta today.  Her skin continues to improve since completing radiation therapy.  She and I reviewed the use of Tamoxifen as anti-estrogen therapy.  I reviewed this with her in detail including risks/benefits.  I gave her detailed information in her AVS regarding Tamoxifen and sent a prescription to her pharmacy.    Herceptin/Perjeta only in 3 weeks Lab, appt with Dr. Lindi Adie, Herceptin/Perjeta in 6 weeks.    The above was reviewed with Dr. Lindi Adie in detail and he is in agreement.

## 2017-04-17 NOTE — Progress Notes (Signed)
[facility] Follow up:    Stacy Fenton, NP [place] [address]   DIAGNOSIS: Cancer Staging Malignant neoplasm of upper-outer quadrant of left female breast [facility]) Staging form: Breast, AJCC 7th Edition - Clinical: No stage assigned - Unsigned - Pathologic stage from 08/29/2016: Stage IIA (T2, N0, cM0) - Unsigned   SUMMARY OF ONCOLOGIC HISTORY:   Malignant neoplasm of upper-outer quadrant of left female breast ([place])   08/21/2016 Initial Diagnosis    Screening detected left breast mass UOQ at 1:00: 1.2 x 1 x 0.5 cm, axilla negative Left breast biopsy 1:00: IDC with DCIS, grade 1, ER 90%, PR 70%, Ki-67 30%, HER-2 positive ratio 2.43, T1c N0 stage IA clinical stage      09/06/2016 Surgery    Left lumpectomy: IDC grade 2, 3.4 cm, Margins Neg, 0/1 LN neg, ER 90%, PR 70%, Ki-67 30%, HER-2 positive ratio 2.43 T2N0 (stage 2 A)      10/09/2016 -  Chemotherapy    TCH Perjeta 6 cycles (Dose reduced due to peripheral neuropathy/neutropenia, last cycle without Taxotere due  followed by Herceptin Perjeta maintenance for 1 year      12/20/2016 Genetic Testing    Genetic counseling and testing for hereditary cancer syndromes performed on 11/22/2016. Results are negative for pathogenic mutations in 46 genes analyzed by [facility]'s Common Hereditary Cancers Panel. Results are dated 12/20/2016. Genes tested: APC, ATM, AXIN2, BARD1, BMPR1A, BRCA1, BRCA2, BRIP1, CDH1, CDKN2A, CHEK2, CTNNA1, DICER1, EPCAM, GREM1, KIT, MEN1, MLH1, MSH2, MSH3, MSH6, MUTYH, NBN, NF1, NTHL1, PALB2, PDGFRA, PMS2, POLD1, POLE, PTEN, RAD50, RAD51C, RAD51D, SDHA, SDHB, SDHC, SDHD, SMAD4, SMARCA4, STK11, TP53, TSC1, TSC2, and VHL.  A variant of uncertain significance (not clinically actionable) was noted in the KIT gene called c.[id number]>G (p.Ser867Arg). At this time, it is unknown if this variant is associated with increased cancer risk or if this is a normal finding. It should not be used to  make medical management decisions.      02/14/2017 - 04/03/2017 Radiation Therapy    Adjuvant radiation ([place]): The left breast was treated to 60.4 Gy, with a primary dose of 50.4 Gy delivered in 28 fractions of 1.8 Gy and a boost dose of 10 Gy delivered in 5 fractions of 2 Gy.      04/2017 -  Anti-estrogen oral therapy    Tamoxifen daily        CURRENT THERAPY: Herceptin/Perjeta  INTERVAL HISTORY: Stacy [name] 56 y.o. female returns for evalaution of her triple positive breast cancer.  She is currently undergoing Hercpetin/Perjeta and continues to tolerate it well.  She underwent echo last on 04/09/2017 that was normal.  She denies any cardiac concerns.  She does have some breast erythema and swelling from the radiation.  She also reports a left upper quadrant pain near her rib cage that started after she started radiation.  She is 55, and her last menstrual cycle was just prior to starting chemotherapy.  She denies any GYN concerns, her periods were normal prior to starting chemotherapy, and she does not have a h/o blood clots.     Patient Active Problem List   Diagnosis Date Noted  . Genetic testing 12/21/2016  . Simple partial seizure disorder ([place]) 10/18/2016  . Port catheter in place 10/09/2016  . GERD (gastroesophageal reflux disease) 10/09/2016  . Malignant neoplasm of upper-outer quadrant of left female breast ([place]) 08/23/2016  . Episodic altered awareness 08/01/2016  . Facial spasm 08/01/2016  .  Right facial numbness 08/01/2016  . Depression 04/20/2016  . Arthritis 06/27/2015  . Insomnia 10/13/2009  . Essential hypertension 04/19/2009  . Fibromyalgia 02/18/2008  . MITRAL VALVE PROLAPSE, HX OF 02/18/2008    is allergic to gabapentin; naproxen; neulasta [pegfilgrastim]; erythromycin; and tramadol hcl.  MEDICAL HISTORY: Past Medical History:  Diagnosis Date  . Anxiety   . Arthritis   . Complication of anesthesia    DIFFICULTY WAKING UP , LAST SURGERY NECK 2012 WAS  FINE PER PT HERE AT [facility]  . Depression   . Fibromyalgia   . Genetic testing 12/21/2016   Genetic testing reported out on 12/20/2016 through [facility]'s 46-gene Common Hereditary Cancers panel found no deleterious mutations. The following 46 genes were analyzed: APC, ATM, AXIN2, BARD1, BMPR1A, BRCA1, BRCA2, BRIP1, CDH1, CDKN2A, CHEK2, CTNNA1, DICER1, EPCAM, GREM1, KIT, MEN1, MLH1, MSH2, MSH3, MSH6, MUTYH, NBN, NF1, NTHL1, PALB2, PDGFRA, PMS2, POLD1, POLE, PTEN, RAD50, RAD51C, RAD51D, SDH  . Headache    HX MIGRAINES    . Heart murmur   . Hypertension   . Malignant neoplasm of upper-outer quadrant of left female breast ([place]) 08/23/2016  . MVP (mitral valve prolapse)    AS INFANT  . Neck pain   . Vision abnormalities     SURGICAL HISTORY: Past Surgical History:  Procedure Laterality Date  . cervical fusiion  2012  . JOINT REPLACEMENT    . MITRAL VALVE REPAIR     MVP 1970  . PORTACATH PLACEMENT Right 09/06/2016   Procedure: INSERTION PORT-A-CATH;  Surgeon: [name], MD;  Location: [place];  Service: General;  Laterality: Right;  . RADIOACTIVE SEED GUIDED MASTECTOMY WITH AXILLARY SENTINEL LYMPH NODE BIOPSY Left 09/06/2016   Procedure: RADIOACTIVE SEED GUIDED LEFT BREAST LUMPECTOMY  WITH SENTINEL LYMPH NODE BIOPSY;  Surgeon: [name], MD;  Location: [place];  Service: General;  Laterality: Left;  . ROTATOR CUFF REPAIR Right 2012  . TOTAL HIP ARTHROPLASTY Right 09/14/2015   Procedure: RIGHT TOTAL HIP ARTHROPLASTY ANTERIOR APPROACH;  Surgeon: Leandrew Koyanagi, MD;  Location: [place];  Service: Orthopedics;  Laterality: Right;  . TUBAL LIGATION  1988    SOCIAL HISTORY: Social History   Social History  . Marital status: Married    Spouse name: N/A  . Number of children: 4  . Years of education: N/A   Occupational History  . Not on file.   Social History Main Topics  . Smoking status: Former Smoker    Packs/day: 0.25    Types: Cigarettes    Quit  date: 01/13/2017  . Smokeless tobacco: Never Used  . Alcohol use No  . Drug use: No  . Sexual activity: Yes    Birth control/ protection: Post-menopausal   Other Topics Concern  . Not on file   Social History Narrative  . No narrative on file    FAMILY HISTORY: Family History  Problem Relation Age of Onset  . Adopted: Yes  . Breast cancer Brother 51       Maternal half-brother. Currently 63.    Review of Systems  Constitutional: Negative for appetite change, chills, fatigue, fever and unexpected weight change.  HENT:   Negative for hearing loss and lump/mass.   Eyes: Negative for eye problems and icterus.  Respiratory: Negative for chest tightness, cough and shortness of breath.   Cardiovascular: Negative for chest pain, leg swelling and palpitations.  Gastrointestinal: Negative for abdominal distention, abdominal pain, constipation, diarrhea, nausea and vomiting.  Endocrine: Positive for hot  flashes.  Genitourinary: Negative for difficulty urinating and dyspareunia.   Musculoskeletal: Negative for arthralgias.  Skin: Negative for itching and rash.  Neurological: Negative for dizziness, extremity weakness and headaches.  Hematological: Negative for adenopathy. Does not bruise/bleed easily.  Psychiatric/Behavioral: Negative for depression. The patient is not nervous/anxious.       PHYSICAL EXAMINATION  ECOG PERFORMANCE STATUS: 1 - Symptomatic but completely ambulatory  Vitals:   04/17/17 0825  BP: 139/79  Pulse: 74  Resp: 18  Temp: 98.3 F (36.8 C)    Physical Exam  Constitutional: She is oriented to person, place, and time and well-developed, well-nourished, and in no distress.  HENT:  Head: Normocephalic and atraumatic.  Mouth/Throat: Oropharynx is clear and moist. No oropharyngeal exudate.  Eyes: Pupils are equal, round, and reactive to light. No scleral icterus.  Neck: Neck supple.  Cardiovascular: Normal rate, regular rhythm, normal heart sounds and  intact distal pulses.   Pulmonary/Chest: Effort normal and breath sounds normal. No respiratory distress.  Left breast with erythema and mild thickness  Abdominal: Soft. Bowel sounds are normal. She exhibits no distension. There is no tenderness.  Musculoskeletal: She exhibits no edema.  Lymphadenopathy:    She has no cervical adenopathy.  Neurological: She is alert and oriented to person, place, and time.  Skin: Skin is warm and dry. No rash noted.  Psychiatric: Mood and affect normal.    LABORATORY DATA:  CBC    Component Value Date/Time   WBC 11.3 (H) 03/11/2017 1151   RBC 3.44 (L) 03/11/2017 1151   HGB 11.1 (L) 03/11/2017 1151   HGB 11.7 03/06/2017 0903   HCT 31.7 (L) 03/11/2017 1151   HCT 33.3 (L) 03/06/2017 0903   PLT 223 03/11/2017 1151   PLT 293 03/06/2017 0903   MCV 92.2 03/11/2017 1151   MCV 95.5 03/06/2017 0903   MCH 32.3 03/11/2017 1151   MCHC 35.0 03/11/2017 1151   RDW 14.3 03/11/2017 1151   RDW 16.1 (H) 03/06/2017 0903   LYMPHSABS 1.2 03/11/2017 1151   LYMPHSABS 1.3 03/06/2017 0903   MONOABS 0.8 03/11/2017 1151   MONOABS 0.8 03/06/2017 0903   EOSABS 0.0 03/11/2017 1151   EOSABS 0.1 03/06/2017 0903   BASOSABS 0.0 03/11/2017 1151   BASOSABS 0.0 03/06/2017 0903    CMP     Component Value Date/Time   NA 138 04/17/2017 0742   K 4.0 04/17/2017 0742   CL 94 (L) 03/11/2017 1151   CO2 25 04/17/2017 0742   GLUCOSE 97 04/17/2017 0742   BUN 13.0 04/17/2017 0742   CREATININE 1.0 04/17/2017 0742   CALCIUM 9.7 04/17/2017 0742   PROT 7.3 04/17/2017 0742   ALBUMIN 3.8 04/17/2017 0742   AST 17 04/17/2017 0742   ALT 16 04/17/2017 0742   ALKPHOS 74 04/17/2017 0742   BILITOT 0.42 04/17/2017 0742   GFRNONAA 44 (L) 03/11/2017 1151   GFRAA 51 (L) 03/11/2017 1151    ASSESSMENT and PLAN:   Malignant neoplasm of upper-outer quadrant of left female breast ([place]) Left lumpectomy 09/06/16: IDC grade 2, 3.4 cm, Margins Neg, 0/1 LN neg, T2N0 ER 90%, PR 70%, Ki-67 30%,  HER-2 positive ratio 2.43 (stage 2 A)  Treatment Plan: 1. adjuvant chemotherapy TCHPfollowed by Herceptin-Perjetamaintenance to complete 1 year. 2. Adjuvant radiation therapycompleted 04/03/2017 3. Adjuvant antiestrogen therapy with Tamoxifen starting in 04/2017 ----------------------------------------------------------------------------- Current Treatment: Herceptin/Perjeta maintenance, starting Tamoxifen  Stacy [name] is doing well today.  Her last echo on 7/24 was normal.  She will proceed with Herceptin/Perjeta today.  Her skin continues to improve since completing radiation therapy.  She and I reviewed the use of Tamoxifen as anti-estrogen therapy.  I reviewed this with her in detail including risks/benefits.  I gave her detailed information in her AVS regarding Tamoxifen and sent a prescription to her pharmacy.    Herceptin/Perjeta only in 3 weeks Lab, appt with Dr. Lindi Adie, Herceptin/Perjeta in 6 weeks.    The above was reviewed with Dr. Lindi Adie in detail and he is in agreement.    All questions were answered. The patient knows to call the clinic with any problems, questions or concerns. We can certainly see the patient much sooner if necessary.  A total of (30) minutes of face-to-face time was spent with this patient with greater than 50% of that time in counseling and care-coordination.  This note was electronically signed. Scot Dock, NP 04/17/2017

## 2017-04-22 DIAGNOSIS — I10 Essential (primary) hypertension: Secondary | ICD-10-CM | POA: Diagnosis not present

## 2017-04-23 ENCOUNTER — Encounter (HOSPITAL_COMMUNITY): Payer: Self-pay | Admitting: Cardiology

## 2017-04-23 ENCOUNTER — Ambulatory Visit (HOSPITAL_COMMUNITY)
Admission: RE | Admit: 2017-04-23 | Discharge: 2017-04-23 | Disposition: A | Discharge status: Home / Self Care | Payer: BLUE CROSS/BLUE SHIELD | Source: Ambulatory Visit | Attending: Cardiology | Admitting: Cardiology

## 2017-04-23 VITALS — BP 128/60 | HR 85 | Wt 215.5 lb

## 2017-04-23 DIAGNOSIS — I10 Essential (primary) hypertension: Secondary | ICD-10-CM | POA: Diagnosis not present

## 2017-04-23 DIAGNOSIS — F329 Major depressive disorder, single episode, unspecified: Secondary | ICD-10-CM | POA: Insufficient documentation

## 2017-04-23 DIAGNOSIS — Z87891 Personal history of nicotine dependence: Secondary | ICD-10-CM | POA: Insufficient documentation

## 2017-04-23 DIAGNOSIS — C50919 Malignant neoplasm of unspecified site of unspecified female breast: Secondary | ICD-10-CM | POA: Insufficient documentation

## 2017-04-23 DIAGNOSIS — Z9889 Other specified postprocedural states: Secondary | ICD-10-CM | POA: Insufficient documentation

## 2017-04-23 DIAGNOSIS — Z79899 Other long term (current) drug therapy: Secondary | ICD-10-CM | POA: Insufficient documentation

## 2017-04-23 DIAGNOSIS — C50412 Malignant neoplasm of upper-outer quadrant of left female breast: Secondary | ICD-10-CM | POA: Diagnosis not present

## 2017-04-23 DIAGNOSIS — E669 Obesity, unspecified: Secondary | ICD-10-CM | POA: Diagnosis not present

## 2017-04-23 DIAGNOSIS — M797 Fibromyalgia: Secondary | ICD-10-CM | POA: Diagnosis not present

## 2017-04-23 DIAGNOSIS — Z6836 Body mass index (BMI) 36.0-36.9, adult: Secondary | ICD-10-CM | POA: Insufficient documentation

## 2017-04-23 NOTE — Progress Notes (Signed)
CARDIO-ONCOLOGY CLINIC NOTE  Oncology: Dr. Lindi Adie  [name] is a 56 yo with history of mitral valve repair in childhood, HTN, and breast cancer presents for cardio-oncology appointment.  Patient had mitral valve repair for mitral regurgitation at age 56, ?cleft mitral valve.  Recent echoes have appeared to show a normal mitral valve.  She was diagnosed with breast cancer in 12/17.  ER+/PR+/HER2+.  She had left lumpectomy.  Chemotherapy was started in 1/18 with taxol/carboplatin/Herceptin/Perjeta.  She received 6 cyles up front and now receiving 1 year of Herceptin/Perjeta.   Tolerating Herceptin and Perjeta well. Has some LE edema but managed with HCTZ. No orthopnea or PND. Starting to become more active. Hair growing back.  - Echo (7/18: EF 60-65%, GLS -19.5% LS - unable to be read. Normal MV. RV normal. - Personally reviewed  PMH: 1. Depression.  2. HTN 3. Fibromyalgia 4. S/p mitral valve repair 1971: Patient was 56 yrs old.  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  5. Breast cancer: Diagnosis in 12/17.  ER+/PR+/HER2+.  She had left lumpectomy.  Chemotherapy was started in 1/18 with taxol/carboplatin/Herceptin/Perjeta.  She will get 6 cyles of this then complete 1 year of Herceptin/Perjeta.  - Echo (12/17): EF 55-60%, moderate LVH, GLS -17%, stable mitral valve repair with no MR.  - Echo (3/18): EF 60-65%, GLS -19.2%, normal mitral valve with no MR, normal RV size and systolic function.  - Echo (7/18: EF 60-65%, GLS -19.5% LS - unable to be read. Normal MV. RV normal. - Personally reviewed   Social History   Social History  . Marital status: Married    Spouse name: N/A  . Number of children: 4  . Years of education: N/A   Occupational History  . Not on file.   Social History Main Topics  . Smoking status: Former Smoker    Packs/day: 0.25    Types: Cigarettes    Quit date: 01/13/2017  . Smokeless tobacco: Never Used  . Alcohol use No  . Drug use: No  . Sexual activity:  Yes    Birth control/ protection: Post-menopausal   Other Topics Concern  . Not on file   Social History Narrative  . No narrative on file   Family History  Problem Relation Age of Onset  . Adopted: Yes  . Breast cancer Brother 64       Maternal half-brother. Currently 56.   ROS: All systems reviewed and negative except as per HPI.   Current Outpatient Prescriptions  Medication Sig Dispense Refill  . hyaluronate sodium (RADIAPLEXRX) GEL Apply 1 application topically 2 (two) times daily.    . hydrochlorothiazide (HYDRODIURIL) 25 MG tablet TAKE 1 TABLET BY MOUTH EVERY DAY 30 tablet 5  . ibuprofen (ADVIL,MOTRIN) 200 MG tablet Take 400 mg by mouth every 4 (four) hours as needed for headache, mild pain or moderate pain.     Marland Kitchen lidocaine-prilocaine (EMLA) cream Apply to affected area once 30 g 3  . tamoxifen (NOLVADEX) 20 MG tablet Take 1 tablet (20 mg total) by mouth daily. 30 tablet 5   No current facility-administered medications for this encounter.    Facility-Administered Medications Ordered in Other Encounters  Medication Dose Route Frequency Provider Last Rate Last Dose  . sodium chloride flush (NS) 0.9 % injection 10 mL  10 mL Intracatheter PRN Nicholas Lose, MD   10 mL at 02/13/17 1236  . sodium chloride flush (NS) 0.9 % injection 10 mL  10 mL Intravenous PRN Nicholas Lose, MD  10 mL at 03/06/17 1245   BP 128/60   Pulse 85   Wt 215 lb 8 oz (97.8 kg)   SpO2 97%   BMI 36.99 kg/m  General:  Well appearing. No resp difficulty HEENT: normal Neck: supple. no JVD. Carotids 2+ bilat; no bruits. No lymphadenopathy or thryomegaly appreciated. Cor: PMI nondisplaced. Regular rate & rhythm. N2/6 SEM across precordium Lungs: clear Abdomen: soft, obese, nontender, nondistended. No hepatosplenomegaly. No bruits or masses. Good bowel sounds. Extremities: no cyanosis, clubbing, rash, trace edema Neuro: alert & orientedx3, cranial nerves grossly intact. moves all 4 extremities w/o  difficulty. Affect pleasant   Assessment/Plan: 1. Breast cancer:  -I reviewed echos personally. EF and Doppler parameters stable. No HF on exam. Continue Herceptin.  2. HTN: BP well controlled on HCTZ 3. S/p surgical mitral valve repair: This was done in childhood for mitral regurgitation.  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  Echo today showed no mitral regurgitation. There was no obvious MV abnormality.   Glori Bickers MD 04/23/2017

## 2017-04-23 NOTE — Patient Instructions (Signed)
We will contact you in 3 months to schedule your next appointment and echocardiogram  

## 2017-04-26 ENCOUNTER — Ambulatory Visit: Payer: BLUE CROSS/BLUE SHIELD | Attending: Hematology and Oncology | Admitting: Physical Therapy

## 2017-04-30 ENCOUNTER — Encounter: Payer: Self-pay | Admitting: Oncology

## 2017-05-03 ENCOUNTER — Ambulatory Visit: Payer: BLUE CROSS/BLUE SHIELD | Admitting: Physical Therapy

## 2017-05-06 ENCOUNTER — Encounter: Payer: Self-pay | Admitting: Radiation Oncology

## 2017-05-06 ENCOUNTER — Ambulatory Visit
Admission: RE | Admit: 2017-05-06 | Discharge: 2017-05-06 | Disposition: A | Discharge status: Home / Self Care | Payer: BLUE CROSS/BLUE SHIELD | Source: Ambulatory Visit | Attending: Radiation Oncology | Admitting: Radiation Oncology

## 2017-05-06 VITALS — BP 142/80 | HR 89 | Temp 98.2°F | Resp 18 | Wt 214.0 lb

## 2017-05-06 DIAGNOSIS — Z881 Allergy status to other antibiotic agents status: Secondary | ICD-10-CM | POA: Diagnosis not present

## 2017-05-06 DIAGNOSIS — C50412 Malignant neoplasm of upper-outer quadrant of left female breast: Secondary | ICD-10-CM | POA: Diagnosis not present

## 2017-05-06 DIAGNOSIS — Z79899 Other long term (current) drug therapy: Secondary | ICD-10-CM | POA: Diagnosis not present

## 2017-05-06 DIAGNOSIS — Z886 Allergy status to analgesic agent status: Secondary | ICD-10-CM | POA: Insufficient documentation

## 2017-05-06 DIAGNOSIS — Z888 Allergy status to other drugs, medicaments and biological substances status: Secondary | ICD-10-CM | POA: Diagnosis not present

## 2017-05-06 DIAGNOSIS — Z923 Personal history of irradiation: Secondary | ICD-10-CM | POA: Insufficient documentation

## 2017-05-06 DIAGNOSIS — Z170 Estrogen receptor positive status [ER+]: Secondary | ICD-10-CM | POA: Diagnosis not present

## 2017-05-06 NOTE — Progress Notes (Signed)
Stacy [name] is here today for 1 month follow up.  Patient reports having burning shooting pain in the left breast close to the incision line and pain under the left breast.  She reports the pain is a 3 out of 10.  Patient reports that the blisters she had under the left breast have resolved.  Patient reports being fatigued but does report that she is starting to get more energy.  Patient denies having any issues with appetite.  Patient reports she is not using Radiaplex or taking the Tamoxifen.  States she is not mentally ready to start taking.   Vitals:   05/06/17 0959  BP: (!) 142/80  Pulse: 89  Resp: 18  Temp: 98.2 F (36.8 C)  TempSrc: Oral  SpO2: 94%  Weight: 214 lb (97.1 kg)   Wt Readings from Last 3 Encounters:  05/06/17 214 lb (97.1 kg)  04/23/17 215 lb 8 oz (97.8 kg)  04/17/17 213 lb 8 oz (96.8 kg)

## 2017-05-06 NOTE — Progress Notes (Signed)
Radiation Oncology         [phone] ________________________________  Name: Stacy [name] MRN: [id number]  Date: 05/06/2017  DOB: 1960/12/18  Follow-Up Visit Note  CC: [name], NP  Nicholas Lose, MD    ICD-10-CM   1. Malignant neoplasm of upper-outer quadrant of left breast in female, estrogen receptor positive ([place]) C50.412    Z17.0     Diagnosis:   Pathological Stage II-ALeftBreast UOQ Invasive Ductal Carcinoma (T2, N0), ER+/ PR+/ Her2+, Grade 2  Interval Since Last Radiation:  4 weeks 5 days, 04/03/2017   Narrative:  The patient returns today for routine follow-up. She notes that she is having burning, shooting, and mild tenderness to her left lateral breast. Pt reports left rib pain that is exacerbated with coughing and laughing and has mildly resolved. Pt reports inner nasal sores x 2-3 months and intermittent watery eyes s/p initiating chemotherapy treatments. Pt states that she is on herceptin and perjeta, but notes that she hasn't started Tamoxifen as of yet and will pick up the prescription. She states that she is still working at this time.    ALLERGIES:  is allergic to gabapentin; naproxen; neulasta [pegfilgrastim]; erythromycin; and tramadol hcl.  Meds: Current Outpatient Prescriptions  Medication Sig Dispense Refill  . hydrochlorothiazide (HYDRODIURIL) 25 MG tablet TAKE 1 TABLET BY MOUTH EVERY DAY 30 tablet 5  . ibuprofen (ADVIL,MOTRIN) 200 MG tablet Take 400 mg by mouth every 4 (four) hours as needed for headache, mild pain or moderate pain.     Marland Kitchen lidocaine-prilocaine (EMLA) cream Apply to affected area once 30 g 3  . hyaluronate sodium (RADIAPLEXRX) GEL Apply 1 application topically 2 (two) times daily.    . tamoxifen (NOLVADEX) 20 MG tablet Take 1 tablet (20 mg total) by mouth daily. (Patient not taking: Reported on 04/23/2017) 30 tablet 5   No current facility-administered medications for this encounter.    Facility-Administered Medications Ordered  in Other Encounters  Medication Dose Route Frequency Provider Last Rate Last Dose  . sodium chloride flush (NS) 0.9 % injection 10 mL  10 mL Intracatheter PRN Nicholas Lose, MD   10 mL at 02/13/17 1236  . sodium chloride flush (NS) 0.9 % injection 10 mL  10 mL Intravenous PRN Nicholas Lose, MD   10 mL at 03/06/17 1245   REVIEW OF SYSTEMS: A 10+ POINT REVIEW OF SYSTEMS WAS OBTAINED including neurology, dermatology, psychiatry, cardiac, respiratory, lymph, extremities, GI, GU, musculoskeletal, constitutional, reproductive, HEENT. All pertinent positives are noted in the HPI. All others are negative.  Physical Findings: The patient is in no acute distress. Patient is alert and oriented.  weight is 214 lb (97.1 kg). Her oral temperature is 98.2 F (36.8 C). Her blood pressure is 142/80 (abnormal) and her pulse is 89. Her respiration is 18 and oxygen saturation is 94%. .  No significant changes.  Lungs are clear to auscultation bilaterally. Heart has regular rate and rhythm. No palpable cervical, supraclavicular, or axillary adenopathy. Abdomen soft, non-tender, normal bowel sounds. Breast: left breast skin well healed with hyperpigmentation changes. No palpable mass in breast. No nipple discharge or bleeding.   Lab Findings: Lab Results  Component Value Date   WBC 6.9 04/17/2017   HGB 11.4 (L) 04/17/2017   HCT 34.1 (L) 04/17/2017   MCV 92.2 04/17/2017   PLT 236 04/17/2017    Radiographic Findings: No results found.  Impression:  The patient is recovering from the effects of radiation. No evidence of recurrence on clinical  exam.  Plan:  PRN follow up in radiation oncology. Pt will continue close follow up in medical oncology and continue adjuvant chemotherapy.   ____________________________________  This document serves as a record of services personally performed by Gery Pray, MD. It was created on her behalf by Steva Colder, a trained medical scribe. The creation of this record is  based on the scribe's personal observations and the provider's statements to them. This document has been checked and approved by the attending provider.  -----------------------------------  Blair Promise, PhD, MD

## 2017-05-08 ENCOUNTER — Ambulatory Visit (HOSPITAL_BASED_OUTPATIENT_CLINIC_OR_DEPARTMENT_OTHER): Payer: BLUE CROSS/BLUE SHIELD

## 2017-05-08 VITALS — BP 135/95 | HR 79 | Temp 97.7°F | Resp 18

## 2017-05-08 DIAGNOSIS — C50412 Malignant neoplasm of upper-outer quadrant of left female breast: Secondary | ICD-10-CM

## 2017-05-08 DIAGNOSIS — Z170 Estrogen receptor positive status [ER+]: Principal | ICD-10-CM

## 2017-05-08 DIAGNOSIS — Z5112 Encounter for antineoplastic immunotherapy: Secondary | ICD-10-CM

## 2017-05-08 MED ORDER — SODIUM CHLORIDE 0.9% FLUSH
10.0000 mL | INTRAVENOUS | Status: DC | PRN
  Administered 2017-05-08: 10 mL
  Filled 2017-05-08: qty 10

## 2017-05-08 MED ORDER — SODIUM CHLORIDE 0.9 % IV SOLN
420.0000 mg | Freq: Once | INTRAVENOUS | Status: AC
  Administered 2017-05-08: 420 mg via INTRAVENOUS
  Filled 2017-05-08: qty 14

## 2017-05-08 MED ORDER — ACETAMINOPHEN 325 MG PO TABS
ORAL_TABLET | ORAL | Status: AC
  Filled 2017-05-08: qty 2

## 2017-05-08 MED ORDER — TRASTUZUMAB CHEMO 150 MG IV SOLR
600.0000 mg | Freq: Once | INTRAVENOUS | Status: AC
  Administered 2017-05-08: 600 mg via INTRAVENOUS
  Filled 2017-05-08: qty 28.57

## 2017-05-08 MED ORDER — SODIUM CHLORIDE 0.9 % IV SOLN
Freq: Once | INTRAVENOUS | Status: AC
  Administered 2017-05-08: 10:00:00 via INTRAVENOUS

## 2017-05-08 MED ORDER — ACETAMINOPHEN 325 MG PO TABS
650.0000 mg | ORAL_TABLET | Freq: Once | ORAL | Status: AC
  Administered 2017-05-08: 650 mg via ORAL

## 2017-05-08 MED ORDER — DIPHENHYDRAMINE HCL 25 MG PO CAPS
ORAL_CAPSULE | ORAL | Status: AC
  Filled 2017-05-08: qty 2

## 2017-05-08 MED ORDER — HEPARIN SOD (PORK) LOCK FLUSH 100 UNIT/ML IV SOLN
500.0000 [IU] | Freq: Once | INTRAVENOUS | Status: AC | PRN
  Administered 2017-05-08: 500 [IU]
  Filled 2017-05-08: qty 5

## 2017-05-08 MED ORDER — DIPHENHYDRAMINE HCL 25 MG PO CAPS
50.0000 mg | ORAL_CAPSULE | Freq: Once | ORAL | Status: AC
  Administered 2017-05-08: 50 mg via ORAL

## 2017-05-08 NOTE — Patient Instructions (Signed)
[facility] Discharge Instructions for Patients Receiving Chemotherapy  Today you received the following chemotherapy agents Herceptin/Perjeta To help prevent nausea and vomiting after your treatment, we encourage you to take your nausea medication as prescribed.   If you develop nausea and vomiting that is not controlled by your nausea medication, call the clinic.   BELOW ARE SYMPTOMS THAT SHOULD BE REPORTED IMMEDIATELY:  *FEVER GREATER THAN 100.5 F  *CHILLS WITH OR WITHOUT FEVER  NAUSEA AND VOMITING THAT IS NOT CONTROLLED WITH YOUR NAUSEA MEDICATION  *UNUSUAL SHORTNESS OF BREATH  *UNUSUAL BRUISING OR BLEEDING  TENDERNESS IN MOUTH AND THROAT WITH OR WITHOUT PRESENCE OF ULCERS  *URINARY PROBLEMS  *BOWEL PROBLEMS  UNUSUAL RASH Items with * indicate a potential emergency and should be followed up as soon as possible.  Feel free to call the clinic you have any questions or concerns. The clinic phone number is [phone].  Please show the CHEMO ALERT CARD at check-in to the Emergency Department and triage nurse.  

## 2017-05-10 ENCOUNTER — Ambulatory Visit: Payer: BLUE CROSS/BLUE SHIELD | Admitting: Physical Therapy

## 2017-05-23 DIAGNOSIS — Z0000 Encounter for general adult medical examination without abnormal findings: Secondary | ICD-10-CM | POA: Diagnosis not present

## 2017-05-24 ENCOUNTER — Ambulatory Visit (HOSPITAL_BASED_OUTPATIENT_CLINIC_OR_DEPARTMENT_OTHER): Payer: BLUE CROSS/BLUE SHIELD | Admitting: Adult Health

## 2017-05-24 ENCOUNTER — Ambulatory Visit: Payer: BLUE CROSS/BLUE SHIELD

## 2017-05-24 ENCOUNTER — Telehealth: Payer: Self-pay

## 2017-05-24 ENCOUNTER — Encounter: Payer: Self-pay | Admitting: Adult Health

## 2017-05-24 VITALS — BP 124/98 | HR 71 | Temp 97.7°F | Resp 18 | Ht 64.0 in | Wt 214.7 lb

## 2017-05-24 DIAGNOSIS — Z170 Estrogen receptor positive status [ER+]: Secondary | ICD-10-CM | POA: Diagnosis not present

## 2017-05-24 DIAGNOSIS — C50412 Malignant neoplasm of upper-outer quadrant of left female breast: Secondary | ICD-10-CM

## 2017-05-24 LAB — COMPREHENSIVE METABOLIC PANEL
ALBUMIN: 4 g/dL (ref 3.5–5.0)
ALK PHOS: 80 U/L (ref 40–150)
ALT: 15 U/L (ref 0–55)
ANION GAP: 9 meq/L (ref 3–11)
AST: 18 U/L (ref 5–34)
BUN: 21.1 mg/dL (ref 7.0–26.0)
CALCIUM: 10.2 mg/dL (ref 8.4–10.4)
CO2: 28 mEq/L (ref 22–29)
Chloride: 101 mEq/L (ref 98–109)
Creatinine: 1.2 mg/dL — ABNORMAL HIGH (ref 0.6–1.1)
EGFR: 52 mL/min/{1.73_m2} — AB (ref >90)
Glucose: 100 mg/dl (ref 70–140)
POTASSIUM: 3.8 meq/L (ref 3.5–5.1)
SODIUM: 137 meq/L (ref 136–145)
Total Bilirubin: 0.54 mg/dL (ref 0.20–1.20)
Total Protein: 8 g/dL (ref 6.4–8.3)

## 2017-05-24 LAB — CBC WITH DIFFERENTIAL/PLATELET
BASO%: 0.5 % (ref 0.0–2.0)
Basophils Absolute: 0 10*3/uL (ref 0.0–0.1)
EOS%: 1 % (ref 0.0–7.0)
Eosinophils Absolute: 0.1 10*3/uL (ref 0.0–0.5)
HEMATOCRIT: 37.1 % (ref 34.8–46.6)
HGB: 13 g/dL (ref 11.6–15.9)
LYMPH#: 1.4 10*3/uL (ref 0.9–3.3)
LYMPH%: 18.4 % (ref 14.0–49.7)
MCH: 31.3 pg (ref 25.1–34.0)
MCHC: 34.9 g/dL (ref 31.5–36.0)
MCV: 89.6 fL (ref 79.5–101.0)
MONO#: 0.6 10*3/uL (ref 0.1–0.9)
MONO%: 7.4 % (ref 0.0–14.0)
NEUT%: 72.7 % (ref 38.4–76.8)
NEUTROS ABS: 5.6 10*3/uL (ref 1.5–6.5)
Platelets: 288 10*3/uL (ref 145–400)
RBC: 4.14 10*6/uL (ref 3.70–5.45)
RDW: 14 % (ref 11.2–14.5)
WBC: 7.7 10*3/uL (ref 3.9–10.3)

## 2017-05-24 NOTE — Progress Notes (Signed)
[facility]acy St[name] Follow up:    Stacy Fenton, NP [place] [address]   DIAGNOSIS: Stacy [name] Staging Malignant neoplasm of upper-outer quadrant of left female breast [name]) Staging form: Breast, AJCC 7th Edition - Clinical: No stage assigned - Unsigned - Pathologic stage from 08/29/2016: Stage IIA (T2, N0, cM0) - Unsigned   SUMMARY OF ONCOLOGIC HISTORY:   Malignant neoplasm of upper-outer quadrant of left female breast (Stacy [name])   08/21/2016 Initial Diagnosis    Screening detected left breast mass UOQ at 1:00: 1.2 x 1 x 0.5 cm, axilla negative Left breast biopsy 1:00: IDC with DCIS, grade 1, ER 90%, PR 70%, Ki-67 30%, HER-2 positive ratio 2.43, T1c N0 stage IA clinical stage      09/06/2016 Surgery    Left lumpectomy: IDC grade 2, 3.4 cm, Margins Neg, 0/1 LN neg, ER 90%, PR 70%, Ki-67 30%, HER-2 positive ratio 2.43 T2N0 (stage 2 A)      10/09/2016 -  Chemotherapy    TCH Perjeta 6 cycles (Dose reduced due to peripheral neuropathy/neutropenia, last cycle without Taxotere due  followed by Herceptin Perjeta maintenance for 1 year      12/20/2016 Genetic Testing    Genetic counseling and testing for hereditary Stacy [name] syndromes performed on 11/22/2016. Results are negative for pathogenic mutations in 46 genes analyzed by [name] Common Hereditary Cancers Panel. Results are dated 12/20/2016. Genes tested: APC, ATM, AXIN2, BARD1, BMPR1A, BRCA1, BRCA2, BRIP1, CDH1, CDKN2A, CHEK2, CTNNA1, DICER1, EPCAM, GREM1, KIT, MEN1, MLH1, MSH2, MSH3, MSH6, MUTYH, NBN, NF1, NTHL1, PALB2, PDGFRA, PMS2, POLD1, POLE, PTEN, RAD50, RAD51C, RAD51D, SDHA, SDHB, SDHC, SDHD, SMAD4, SMARCA4, STK11, TP53, TSC1, TSC2, and VHL.  A variant of uncertain significance (not clinically actionable) was noted in the KIT gene called [id number] (p.Ser867Arg). At this time, it is unknown if this variant is associated with increased Stacy [name] risk or if this is a normal finding. It should not be used to  make medical management decisions.      02/14/2017 - 04/03/2017 Radiation Therapy    Adjuvant radiation ([place]): The left breast was treated to 60.4 Gy, with a primary dose of 50.4 Gy delivered in 28 fractions of 1.8 Gy and a boost dose of 10 Gy delivered in 5 fractions of 2 Gy.      04/2017 -  Anti-estrogen oral therapy    Tamoxifen daily        CURRENT THERAPY: Tamoxifen/Herceptin/Perjeta  INTERVAL HISTORY: Stacy Stacy [name] 56 y.o. female returns for evaluation of an urgent issue.  She has "sores on her breasts"  She has an area on her left breast that was scabbed over about 10 days ago that is now like a divet.  A short time later a place under it is like a boil, under the skin.  She has also noted different skin lesions on her lower leg, her arms, and under her right eye.  These areas are circular and red.    She also is experiencing abdominal cramping.  This is a constant ache in her abdomen, and then lower pelvic cramps.  She started Tamoxifen 10 days ago, and the skin issues started shortly thereafter, and the cramping happened about three days ago.  The cramping has contributed to nausea.     Patient Active Problem List   Diagnosis Date Noted  . Genetic testing 12/21/2016  . Simple partial seizure disorder (Stacy Stacy [name]) 10/18/2016  . Port catheter in place 10/09/2016  . GERD (gastroesophageal reflux disease) 10/09/2016  .  Malignant neoplasm of upper-outer quadrant of left female breast ([place]) 08/23/2016  . Episodic altered awareness 08/01/2016  . Facial spasm 08/01/2016  . Right facial numbness 08/01/2016  . Depression 04/20/2016  . Arthritis 06/27/2015  . Insomnia 10/13/2009  . Essential hypertension 04/19/2009  . Fibromyalgia 02/18/2008  . MITRAL VALVE PROLAPSE, HX OF 02/18/2008    is allergic to gabapentin; naproxen; neulasta [pegfilgrastim]; erythromycin; and tramadol hcl.  MEDICAL HISTORY: Past Medical History:  Diagnosis Date  . Anxiety   . Arthritis   . Complication of  anesthesia    DIFFICULTY WAKING UP , LAST SURGERY NECK 2012 WAS FINE PER PT HERE AT [facility]  . Depression   . Fibromyalgia   . Genetic testing 12/21/2016   Genetic testing reported out on 12/20/2016 through [name]'s 46-gene Common Hereditary Cancers panel found no deleterious mutations. The following 46 genes were analyzed: APC, ATM, AXIN2, BARD1, BMPR1A, BRCA1, BRCA2, BRIP1, CDH1, CDKN2A, CHEK2, CTNNA1, DICER1, EPCAM, GREM1, KIT, MEN1, MLH1, MSH2, MSH3, MSH6, MUTYH, NBN, NF1, NTHL1, PALB2, PDGFRA, PMS2, POLD1, POLE, PTEN, RAD50, RAD51C, RAD51D, SDH  . Headache    HX MIGRAINES    . Heart murmur   . History of radiation therapy 02/14/17-04/03/2017   left breast 60.4 Gy: 50.4 Gy delivered in 28 fractions and a boost of 10 Gy delivered in 5 fractions.  . Hypertension   . Malignant neoplasm of upper-outer quadrant of left female breast ([place]) 08/23/2016  . MVP (mitral valve prolapse)    AS INFANT  . Neck pain   . Vision abnormalities     SURGICAL HISTORY: Past Surgical History:  Procedure Laterality Date  . cervical fusiion  2012  . JOINT REPLACEMENT    . MITRAL VALVE REPAIR     MVP 1970  . PORTACATH PLACEMENT Right 09/06/2016   Procedure: INSERTION PORT-A-CATH;  Surgeon: [name], MD;  Location: [place];  Service: General;  Laterality: Right;  . RADIOACTIVE SEED GUIDED MASTECTOMY WITH AXILLARY SENTINEL LYMPH NODE BIOPSY Left 09/06/2016   Procedure: RADIOACTIVE SEED GUIDED LEFT BREAST LUMPECTOMY  WITH SENTINEL LYMPH NODE BIOPSY;  Surgeon: [name], MD;  Location: [place];  Service: General;  Laterality: Left;  . ROTATOR CUFF REPAIR Right 2012  . TOTAL HIP ARTHROPLASTY Right 09/14/2015   Procedure: RIGHT TOTAL HIP ARTHROPLASTY ANTERIOR APPROACH;  Surgeon: Leandrew Koyanagi, MD;  Location: [place];  Service: Orthopedics;  Laterality: Right;  . TUBAL LIGATION  1988    SOCIAL HISTORY: Social History   Social History  . Marital status: Married    Spouse  name: N/A  . Number of children: 4  . Years of education: N/A   Occupational History  . Not on file.   Social History Main Topics  . Smoking status: Former Smoker    Packs/day: 0.25    Types: Cigarettes    Quit date: 01/13/2017  . Smokeless tobacco: Never Used  . Alcohol use No  . Drug use: No  . Sexual activity: Yes    Birth control/ protection: Post-menopausal   Other Topics Concern  . Not on file   Social History Narrative  . No narrative on file    FAMILY HISTORY: Family History  Problem Relation Age of Onset  . Adopted: Yes  . Breast Stacy [name] Brother 15       Maternal half-brother. Currently 20.    Review of Systems  Constitutional: Negative for appetite change, chills, fatigue, fever and unexpected weight change.  HENT:   Negative for  hearing loss and lump/mass.   Eyes: Negative for eye problems and icterus.  Respiratory: Negative for chest tightness, cough and shortness of breath.   Cardiovascular: Negative for chest pain, leg swelling and palpitations.  Gastrointestinal: Positive for abdominal pain and nausea. Negative for abdominal distention, blood in stool, constipation, diarrhea and vomiting.  Endocrine: Negative for hot flashes.  Genitourinary: Positive for pelvic pain.   Musculoskeletal: Negative for arthralgias.  Skin: Negative for itching and rash.  Neurological: Negative for dizziness, extremity weakness, headaches and numbness.  Hematological: Negative for adenopathy. Does not bruise/bleed easily.  Psychiatric/Behavioral: Negative for depression. The patient is not nervous/anxious.       PHYSICAL EXAMINATION  ECOG PERFORMANCE STATUS: 1 - Symptomatic but completely ambulatory  Vitals:   05/24/17 1152  BP: (!) 124/98  Pulse: 71  Resp: 18  Temp: 97.7 F (36.5 C)  SpO2: 96%    Physical Exam  Constitutional: She is oriented to person, place, and time and well-developed, well-nourished, and in no distress.  HENT:  Head: Normocephalic and  atraumatic.  Mouth/Throat: Oropharynx is clear and moist. No oropharyngeal exudate.  Eyes: Pupils are equal, round, and reactive to light. No scleral icterus.  Neck: Neck supple.  Cardiovascular: Normal rate, regular rhythm and normal heart sounds.   Pulmonary/Chest: Effort normal and breath sounds normal.  Left breast lesions, at about 3 and 5 oclock.  The one at 3 oclock is a scab, and is healing with surrounding erythema.  The one at 5 oclock is erythematous papular lesion.  These are not warm, the erythema is blanchable, non tender, no drainage noted, no significant swelling noted either  Abdominal: Soft. Bowel sounds are normal. She exhibits no distension and no mass. There is no tenderness. There is no rebound and no guarding.  Musculoskeletal: She exhibits no edema.  Lymphadenopathy:    She has no cervical adenopathy.  Neurological: She is alert and oriented to person, place, and time.  Skin: Skin is warm and dry. Rash (several erythematous scabbed over circular lesions on right wrist and on right foot) noted.  Psychiatric: Mood and affect normal.    LABORATORY DATA:  CBC    Component Value Date/Time   WBC 7.7 05/24/2017 1233   WBC 11.3 (H) 03/11/2017 1151   RBC 4.14 05/24/2017 1233   RBC 3.44 (L) 03/11/2017 1151   HGB 13.0 05/24/2017 1233   HCT 37.1 05/24/2017 1233   PLT 288 05/24/2017 1233   MCV 89.6 05/24/2017 1233   MCH 31.3 05/24/2017 1233   MCH 32.3 03/11/2017 1151   MCHC 34.9 05/24/2017 1233   MCHC 35.0 03/11/2017 1151   RDW 14.0 05/24/2017 1233   LYMPHSABS 1.4 05/24/2017 1233   MONOABS 0.6 05/24/2017 1233   EOSABS 0.1 05/24/2017 1233   BASOSABS 0.0 05/24/2017 1233    CMP     Component Value Date/Time   NA 138 04/17/2017 0742   K 4.0 04/17/2017 0742   CL 94 (L) 03/11/2017 1151   CO2 25 04/17/2017 0742   GLUCOSE 97 04/17/2017 0742   BUN 13.0 04/17/2017 0742   CREATININE 1.0 04/17/2017 0742   CALCIUM 9.7 04/17/2017 0742   PROT 7.3 04/17/2017 0742    ALBUMIN 3.8 04/17/2017 0742   AST 17 04/17/2017 0742   ALT 16 04/17/2017 0742   ALKPHOS 74 04/17/2017 0742   BILITOT 0.42 04/17/2017 0742   GFRNONAA 44 (L) 03/11/2017 1151   GFRAA 51 (L) 03/11/2017 1151     ASSESSMENT and  PLAN:  Malignant neoplasm of upper-outer quadrant of left female breast ([place]) Left lumpectomy 09/06/16: IDC grade 2, 3.4 cm, Margins Neg, 0/1 LN neg, T2N0 ER 90%, PR 70%, Ki-67 30%, HER-2 positive ratio 2.43 (stage 2 A)  Treatment Plan: 1. adjuvant chemotherapy TCHPfollowed by Herceptin-Perjetamaintenance to complete 1 year. 2. Adjuvant radiation therapycompleted 04/03/2017 3. Adjuvant antiestrogen therapy with Tamoxifen starting in 04/2017 ----------------------------------------------------------------------------- Current Treatment: Herceptin/Perjeta maintenance, Tamoxifen  I am concerned about the timing of Anadalay's symptoms and how they seem to coincide with the start of the Tamoxifen, particularly this new rash.  She is going to stop the tamoxifen for a few days and see if they subside.  She has appt next week for eval prior to receiving Herceptin/Perjeta, so I will wait and see how she responds to holding Tamoxifen.  She will also have labs today evaluating her hormone levels, in case we do need to switch her to Anastrozole.  I reviewed this with her in detail and she is in agreement.    Marchella will return next week for labs, evaluation, and maintenance Herceptin/Perjeta.      Orders Placed This Encounter  Procedures  . Estradiol, Sensitive    Standing Status:   Future    Number of Occurrences:   1    Standing Expiration Date:   05/24/2018  . FSH-Follicle stimulating hormone    Standing Status:   Future    Number of Occurrences:   1    Standing Expiration Date:   05/24/2018  . LH-Luteinizing hormone    Standing Status:   Future    Number of Occurrences:   1    Standing Expiration Date:   05/24/2018    All questions were answered. The patient knows to  call the clinic with any problems, questions or concerns. We can certainly see the patient much sooner if necessary.  A total of (20) minutes of face-to-face time was spent with this patient with greater than 50% of that time in counseling and care-coordination.  This note was electronically signed. Scot Dock, NP 05/24/2017

## 2017-05-24 NOTE — Telephone Encounter (Signed)
Pt called with a sore on her L breast that was scabbed that is now like a divet. Red surrounding size of a nail head. Non painful. Getting a little better. Has used antibiotic cream.   Another area on L breast lateral side is under the skin like a boil. Red area about a quarter size. It is mildly tender. Not getting better or worse.  Both areas showed up about a week ago.   Abdominal cramping. She started tamoxifen about 1 week ago. Cramping started about 3 days ago. Not related to eating or having BM.  Feelings tight constantly then will draw up like a menstrual cramp. Had to leave work yesterday b/c the cramping was making feel like she needed to vomit. Ibuprofen does not help. She is also having nausea.  No fever, no urinary problems, no resp issues. No palpitations.   S/w Dr Lindi Adie and made appt at 1130 with Mendel Ryder NP.  pt is aware, in basket sent.

## 2017-05-24 NOTE — Assessment & Plan Note (Signed)
Left lumpectomy 09/06/16: IDC grade 2, 3.4 cm, Margins Neg, 0/1 LN neg, T2N0 ER 90%, PR 70%, Ki-67 30%, HER-2 positive ratio 2.43 (stage 2 A)  Treatment Plan: 1. adjuvant chemotherapy TCHPfollowed by Herceptin-Perjetamaintenance to complete 1 year. 2. Adjuvant radiation therapycompleted 04/03/2017 3. Adjuvant antiestrogen therapy with Tamoxifen starting in 04/2017 ----------------------------------------------------------------------------- Current Treatment: Herceptin/Perjeta maintenance, Tamoxifen  I am concerned about the timing of Stacy [name]'s symptoms and how they seem to coincide with the start of the Tamoxifen, particularly this new rash.  She is going to stop the tamoxifen for a few days and see if they subside.  She has appt next week for eval prior to receiving Herceptin/Perjeta, so I will wait and see how she responds to holding Tamoxifen.  She will also have labs today evaluating her hormone levels, in case we do need to switch her to Anastrozole.  I reviewed this with her in detail and she is in agreement.    Stacy [name] will return next week for labs, evaluation, and maintenance Herceptin/Perjeta.

## 2017-05-25 LAB — FOLLICLE STIMULATING HORMONE: FSH: 90.4 m[IU]/mL

## 2017-05-25 LAB — LUTEINIZING HORMONE: LH: 49 m[IU]/mL

## 2017-05-29 ENCOUNTER — Encounter: Payer: Self-pay | Admitting: Adult Health

## 2017-05-29 ENCOUNTER — Other Ambulatory Visit (HOSPITAL_BASED_OUTPATIENT_CLINIC_OR_DEPARTMENT_OTHER): Payer: BLUE CROSS/BLUE SHIELD

## 2017-05-29 ENCOUNTER — Ambulatory Visit (HOSPITAL_BASED_OUTPATIENT_CLINIC_OR_DEPARTMENT_OTHER): Payer: BLUE CROSS/BLUE SHIELD

## 2017-05-29 ENCOUNTER — Telehealth: Payer: Self-pay | Admitting: Adult Health

## 2017-05-29 ENCOUNTER — Ambulatory Visit (HOSPITAL_BASED_OUTPATIENT_CLINIC_OR_DEPARTMENT_OTHER): Payer: BLUE CROSS/BLUE SHIELD | Admitting: Adult Health

## 2017-05-29 ENCOUNTER — Encounter: Payer: Self-pay | Admitting: *Deleted

## 2017-05-29 ENCOUNTER — Ambulatory Visit: Payer: BLUE CROSS/BLUE SHIELD

## 2017-05-29 VITALS — BP 141/74 | HR 79 | Temp 98.0°F | Resp 18 | Wt 215.1 lb

## 2017-05-29 DIAGNOSIS — Z170 Estrogen receptor positive status [ER+]: Secondary | ICD-10-CM

## 2017-05-29 DIAGNOSIS — C50412 Malignant neoplasm of upper-outer quadrant of left female breast: Secondary | ICD-10-CM

## 2017-05-29 DIAGNOSIS — Z5112 Encounter for antineoplastic immunotherapy: Secondary | ICD-10-CM

## 2017-05-29 DIAGNOSIS — L739 Follicular disorder, unspecified: Secondary | ICD-10-CM

## 2017-05-29 DIAGNOSIS — Z95828 Presence of other vascular implants and grafts: Secondary | ICD-10-CM

## 2017-05-29 LAB — CBC WITH DIFFERENTIAL/PLATELET
BASO%: 0.1 % (ref 0.0–2.0)
Basophils Absolute: 0 10*3/uL (ref 0.0–0.1)
EOS%: 1.4 % (ref 0.0–7.0)
Eosinophils Absolute: 0.1 10*3/uL (ref 0.0–0.5)
HCT: 35.5 % (ref 34.8–46.6)
HGB: 11.9 g/dL (ref 11.6–15.9)
LYMPH%: 12.8 % — AB (ref 14.0–49.7)
MCH: 30.2 pg (ref 25.1–34.0)
MCHC: 33.5 g/dL (ref 31.5–36.0)
MCV: 90.1 fL (ref 79.5–101.0)
MONO#: 0.7 10*3/uL (ref 0.1–0.9)
MONO%: 7.9 % (ref 0.0–14.0)
NEUT#: 7.2 10*3/uL — ABNORMAL HIGH (ref 1.5–6.5)
NEUT%: 77.8 % — ABNORMAL HIGH (ref 38.4–76.8)
Platelets: 253 10*3/uL (ref 145–400)
RBC: 3.94 10*6/uL (ref 3.70–5.45)
RDW: 13.5 % (ref 11.2–14.5)
WBC: 9.2 10*3/uL (ref 3.9–10.3)
lymph#: 1.2 10*3/uL (ref 0.9–3.3)

## 2017-05-29 LAB — COMPREHENSIVE METABOLIC PANEL
ALBUMIN: 3.7 g/dL (ref 3.5–5.0)
ALK PHOS: 78 U/L (ref 40–150)
ALT: 11 U/L (ref 0–55)
AST: 15 U/L (ref 5–34)
Anion Gap: 9 mEq/L (ref 3–11)
BILIRUBIN TOTAL: 0.38 mg/dL (ref 0.20–1.20)
BUN: 20.4 mg/dL (ref 7.0–26.0)
CO2: 23 mEq/L (ref 22–29)
CREATININE: 1 mg/dL (ref 0.6–1.1)
Calcium: 9.5 mg/dL (ref 8.4–10.4)
Chloride: 107 mEq/L (ref 98–109)
EGFR: 64 mL/min/{1.73_m2} — ABNORMAL LOW (ref >90)
Glucose: 98 mg/dl (ref 70–140)
POTASSIUM: 3.8 meq/L (ref 3.5–5.1)
Sodium: 139 mEq/L (ref 136–145)
TOTAL PROTEIN: 7.4 g/dL (ref 6.4–8.3)

## 2017-05-29 LAB — ESTRADIOL, ULTRA SENS: Estradiol, Sensitive: 5.8 pg/mL

## 2017-05-29 MED ORDER — DIPHENHYDRAMINE HCL 25 MG PO CAPS
ORAL_CAPSULE | ORAL | Status: AC
  Filled 2017-05-29: qty 2

## 2017-05-29 MED ORDER — HEPARIN SOD (PORK) LOCK FLUSH 100 UNIT/ML IV SOLN
500.0000 [IU] | Freq: Once | INTRAVENOUS | Status: AC | PRN
  Administered 2017-05-29: 500 [IU]
  Filled 2017-05-29: qty 5

## 2017-05-29 MED ORDER — DIPHENHYDRAMINE HCL 25 MG PO CAPS
50.0000 mg | ORAL_CAPSULE | Freq: Once | ORAL | Status: AC
  Administered 2017-05-29: 50 mg via ORAL

## 2017-05-29 MED ORDER — LIDOCAINE-PRILOCAINE 2.5-2.5 % EX CREA: TOPICAL_CREAM | CUTANEOUS | 3 refills | Status: DC

## 2017-05-29 MED ORDER — ACETAMINOPHEN 325 MG PO TABS
ORAL_TABLET | ORAL | Status: AC
  Filled 2017-05-29: qty 2

## 2017-05-29 MED ORDER — DOXYCYCLINE HYCLATE 100 MG PO TABS: 100.0000 mg | ORAL_TABLET | Freq: Two times a day (BID) | ORAL | 0 refills | Status: DC

## 2017-05-29 MED ORDER — SODIUM CHLORIDE 0.9% FLUSH
10.0000 mL | INTRAVENOUS | Status: DC | PRN
  Administered 2017-05-29: 10 mL via INTRAVENOUS
  Filled 2017-05-29: qty 10

## 2017-05-29 MED ORDER — SODIUM CHLORIDE 0.9 % IV SOLN
420.0000 mg | Freq: Once | INTRAVENOUS | Status: AC
  Administered 2017-05-29: 420 mg via INTRAVENOUS
  Filled 2017-05-29: qty 14

## 2017-05-29 MED ORDER — SODIUM CHLORIDE 0.9% FLUSH
10.0000 mL | INTRAVENOUS | Status: DC | PRN
  Administered 2017-05-29: 10 mL
  Filled 2017-05-29: qty 10

## 2017-05-29 MED ORDER — TRASTUZUMAB CHEMO 150 MG IV SOLR
600.0000 mg | Freq: Once | INTRAVENOUS | Status: AC
  Administered 2017-05-29: 600 mg via INTRAVENOUS
  Filled 2017-05-29: qty 28.57

## 2017-05-29 MED ORDER — SODIUM CHLORIDE 0.9 % IV SOLN
Freq: Once | INTRAVENOUS | Status: AC
  Administered 2017-05-29: 11:00:00 via INTRAVENOUS

## 2017-05-29 MED ORDER — ACETAMINOPHEN 325 MG PO TABS
650.0000 mg | ORAL_TABLET | Freq: Once | ORAL | Status: AC
  Administered 2017-05-29: 650 mg via ORAL

## 2017-05-29 NOTE — Patient Instructions (Signed)
[facility] Discharge Instructions for Patients Receiving Chemotherapy  Today you received the following chemotherapy agents Herceptin and Perjeta  To help prevent nausea and vomiting after your treatment, we encourage you to take your nausea medication as directed  ONC AP TAKE HOME EUMP:[id number]}   If you develop nausea and vomiting that is not controlled by your nausea medication, call the clinic. {CHL   BELOW ARE SYMPTOMS THAT SHOULD BE REPORTED IMMEDIATELY:  *FEVER GREATER THAN 100.5 F  *CHILLS WITH OR WITHOUT FEVER  NAUSEA AND VOMITING THAT IS NOT CONTROLLED WITH YOUR NAUSEA MEDICATION  *UNUSUAL SHORTNESS OF BREATH  *UNUSUAL BRUISING OR BLEEDING  TENDERNESS IN MOUTH AND THROAT WITH OR WITHOUT PRESENCE OF ULCERS  *URINARY PROBLEMS  *BOWEL PROBLEMS  UNUSUAL RASH Items with * indicate a potential emergency and should be followed up as soon as possible.  Feel free to call the clinic you have any questions or concerns. The clinic phone number is [phone].  Please show the [facility] at check-in to the Emergency Department and triage nurse.

## 2017-05-29 NOTE — Addendum Note (Signed)
Addended by: Scot Dock on: 05/29/2017 02:16 PM   Modules accepted: Orders

## 2017-05-29 NOTE — Patient Instructions (Signed)

## 2017-05-29 NOTE — Assessment & Plan Note (Addendum)
Left lumpectomy 09/06/16: IDC grade 2, 3.4 cm, Margins Neg, 0/1 LN neg, T2N0 ER 90%, PR 70%, Ki-67 30%, HER-2 positive ratio 2.43 (stage 2 A)  Treatment Plan: 1. adjuvant chemotherapy TCHPfollowed by Herceptin-Perjetamaintenance to complete 1 year. 2. Adjuvant radiation therapycompleted 04/03/2017 3. Adjuvant antiestrogen therapy with Tamoxifen x 1 month (unable to tolerate) ----------------------------------------------------------------------------- Current Treatment: Herceptin/Perjeta maintenance  Jillisa is doing well today.  She will proceed with Herceptin and Perjeta.  I prescribed Doxycycline for her skin, hopefully that will help this rash.  Also, she and I reviewed Anastrozole and I gave her info about Anastrozole to read.  She will think about it, and wait until her next appt in 6 weeks to review with Dr. Lindi Adie before deciding on whether or not to take it.  She is post menopausal.

## 2017-05-29 NOTE — Progress Notes (Signed)
[facility] Follow up:    Stacy Fenton, NP [place] [address]   DIAGNOSIS: Cancer Staging Malignant neoplasm of upper-outer quadrant of left female breast [facility]) Staging form: Breast, AJCC 7th Edition - Clinical: No stage assigned - Unsigned - Pathologic stage from 08/29/2016: Stage IIA (T2, N0, cM0) - Unsigned   SUMMARY OF ONCOLOGIC HISTORY:   Malignant neoplasm of upper-outer quadrant of left female breast ([place])   08/21/2016 Initial Diagnosis    Screening detected left breast mass UOQ at 1:00: 1.2 x 1 x 0.5 cm, axilla negative Left breast biopsy 1:00: IDC with DCIS, grade 1, ER 90%, PR 70%, Ki-67 30%, HER-2 positive ratio 2.43, T1c N0 stage IA clinical stage      09/06/2016 Surgery    Left lumpectomy: IDC grade 2, 3.4 cm, Margins Neg, 0/1 LN neg, ER 90%, PR 70%, Ki-67 30%, HER-2 positive ratio 2.43 T2N0 (stage 2 A)      10/09/2016 -  Chemotherapy    TCH Perjeta 6 cycles (Dose reduced due to peripheral neuropathy/neutropenia, last cycle without Taxotere due  followed by Herceptin Perjeta maintenance for 1 year      12/20/2016 Genetic Testing    Genetic counseling and testing for hereditary cancer syndromes performed on 11/22/2016. Results are negative for pathogenic mutations in 46 genes analyzed by [facility]'s Common Hereditary Cancers Panel. Results are dated 12/20/2016. Genes tested: APC, ATM, AXIN2, BARD1, BMPR1A, BRCA1, BRCA2, BRIP1, CDH1, CDKN2A, CHEK2, CTNNA1, DICER1, EPCAM, GREM1, KIT, MEN1, MLH1, MSH2, MSH3, MSH6, MUTYH, NBN, NF1, NTHL1, PALB2, PDGFRA, PMS2, POLD1, POLE, PTEN, RAD50, RAD51C, RAD51D, SDHA, SDHB, SDHC, SDHD, SMAD4, SMARCA4, STK11, TP53, TSC1, TSC2, and VHL.  A variant of uncertain significance (not clinically actionable) was noted in the KIT gene called c.2601C>G (p.Ser867Arg). At this time, it is unknown if this variant is associated with increased cancer risk or if this is a normal finding. It should not be used to  make medical management decisions.      02/14/2017 - 04/03/2017 Radiation Therapy    Adjuvant radiation ([facility]): The left breast was treated to 60.4 Gy, with a primary dose of 50.4 Gy delivered in 28 fractions of 1.8 Gy and a boost dose of 10 Gy delivered in 5 fractions of 2 Gy.      04/2017 - 05/2017 Anti-estrogen oral therapy    Tamoxifen daily        CURRENT THERAPY: Herceptin/Perjeta  INTERVAL HISTORY: Stacy [name] 56 y.o. female returns for evaluation prior to receiving Herceptin and Perjeta.  She is tolerating it well.  I did see her last week for concerns over taking Tamoxifen.  She developed some skin issues, which were areas of erythema and pustular type lesions since starting the Tamoxifen.  She also had some abdominal cramping.  She stopped the tamoxifen and the abdominal cramping has improved greatly.  The rash is still present.  She in general feels better since stopping it.  She did have some blood work done last week that determined that she is post menopausal.      Patient Active Problem List   Diagnosis Date Noted  . Genetic testing 12/21/2016  . Simple partial seizure disorder ([place]) 10/18/2016  . Port catheter in place 10/09/2016  . GERD (gastroesophageal reflux disease) 10/09/2016  . Malignant neoplasm of upper-outer quadrant of left female breast ([place]) 08/23/2016  . Episodic altered awareness 08/01/2016  . Facial spasm 08/01/2016  . Right facial numbness 08/01/2016  . Depression  04/20/2016  . Arthritis 06/27/2015  . Insomnia 10/13/2009  . Essential hypertension 04/19/2009  . Fibromyalgia 02/18/2008  . MITRAL VALVE PROLAPSE, HX OF 02/18/2008    is allergic to gabapentin; naproxen; neulasta [pegfilgrastim]; erythromycin; and tramadol hcl.  MEDICAL HISTORY: Past Medical History:  Diagnosis Date  . Anxiety   . Arthritis   . Complication of anesthesia    DIFFICULTY WAKING UP , LAST SURGERY NECK 2012 WAS FINE PER PT HERE AT [facility]  . Depression   . Fibromyalgia    . Genetic testing 12/21/2016   Genetic testing reported out on 12/20/2016 through [facility]'s 46-gene Common Hereditary Cancers panel found no deleterious mutations. The following 46 genes were analyzed: APC, ATM, AXIN2, BARD1, BMPR1A, BRCA1, BRCA2, BRIP1, CDH1, CDKN2A, CHEK2, CTNNA1, DICER1, EPCAM, GREM1, KIT, MEN1, MLH1, MSH2, MSH3, MSH6, MUTYH, NBN, NF1, NTHL1, PALB2, PDGFRA, PMS2, POLD1, POLE, PTEN, RAD50, RAD51C, RAD51D, SDH  . Headache    HX MIGRAINES    . Heart murmur   . History of radiation therapy 02/14/17-04/03/2017   left breast 60.4 Gy: 50.4 Gy delivered in 28 fractions and a boost of 10 Gy delivered in 5 fractions.  . Hypertension   . Malignant neoplasm of upper-outer quadrant of left female breast ([place]) 08/23/2016  . MVP (mitral valve prolapse)    AS INFANT  . Neck pain   . Vision abnormalities     SURGICAL HISTORY: Past Surgical History:  Procedure Laterality Date  . cervical fusiion  2012  . JOINT REPLACEMENT    . MITRAL VALVE REPAIR     MVP 1970  . PORTACATH PLACEMENT Right 09/06/2016   Procedure: INSERTION PORT-A-CATH;  Surgeon: [name], MD;  Location: [facility];  Service: General;  Laterality: Right;  . RADIOACTIVE SEED GUIDED MASTECTOMY WITH AXILLARY SENTINEL LYMPH NODE BIOPSY Left 09/06/2016   Procedure: RADIOACTIVE SEED GUIDED LEFT BREAST LUMPECTOMY  WITH SENTINEL LYMPH NODE BIOPSY;  Surgeon: [name], MD;  Location: [facility];  Service: General;  Laterality: Left;  . ROTATOR CUFF REPAIR Right 2012  . TOTAL HIP ARTHROPLASTY Right 09/14/2015   Procedure: RIGHT TOTAL HIP ARTHROPLASTY ANTERIOR APPROACH;  Surgeon: Leandrew Koyanagi, MD;  Location: [facility];  Service: Orthopedics;  Laterality: Right;  . TUBAL LIGATION  1988    SOCIAL HISTORY: Social History   Social History  . Marital status: Married    Spouse name: N/A  . Number of children: 4  . Years of education: N/A   Occupational History  . Not on file.   Social  History Main Topics  . Smoking status: Former Smoker    Packs/day: 0.25    Types: Cigarettes    Quit date: 01/13/2017  . Smokeless tobacco: Never Used  . Alcohol use No  . Drug use: No  . Sexual activity: Yes    Birth control/ protection: Post-menopausal   Other Topics Concern  . Not on file   Social History Narrative  . No narrative on file    FAMILY HISTORY: Family History  Problem Relation Age of Onset  . Adopted: Yes  . Breast cancer Brother 11       Maternal half-brother. Currently 67.    Review of Systems  Constitutional: Negative for appetite change, chills, fatigue, fever and unexpected weight change.  HENT:   Negative for hearing loss and lump/mass.   Eyes: Negative for eye problems and icterus.  Respiratory: Negative for chest tightness, cough and shortness of breath.   Cardiovascular: Negative for chest pain,  leg swelling and palpitations.  Gastrointestinal: Negative for abdominal distention, abdominal pain, constipation, diarrhea, nausea and vomiting.  Endocrine: Negative for hot flashes.  Musculoskeletal: Negative for arthralgias.  Skin: Negative for itching and rash.  Neurological: Negative for dizziness, extremity weakness and headaches.  Hematological: Negative for adenopathy. Does not bruise/bleed easily.  Psychiatric/Behavioral: Negative for depression. The patient is not nervous/anxious.       PHYSICAL EXAMINATION  ECOG PERFORMANCE STATUS: 1 - Symptomatic but completely ambulatory  Vitals:   05/29/17 0959  BP: (!) 141/74  Pulse: 79  Resp: 18  Temp: 98 F (36.7 C)  SpO2: 100%    Physical Exam  Constitutional: She is oriented to person, place, and time and well-developed, well-nourished, and in no distress.  HENT:  Head: Normocephalic and atraumatic.  Mouth/Throat: Oropharynx is clear and moist. No oropharyngeal exudate.  Eyes: Pupils are equal, round, and reactive to light. No scleral icterus.  Neck: Neck supple.  Cardiovascular: Normal  rate, regular rhythm and normal heart sounds.   Pulmonary/Chest: Effort normal and breath sounds normal.  Abdominal: Soft. Bowel sounds are normal. She exhibits no distension.  Musculoskeletal: She exhibits no edema.  Lymphadenopathy:    She has no cervical adenopathy.  Neurological: She is alert and oriented to person, place, and time.  Skin:  The erythematous pustular like lesions are still present on the wrist and the foot, in addition she has one on the back of her neck today that she didn't even realize was there.  Breast skin lesion appears to be healing, but remains erythematous.    Psychiatric: Mood and affect normal.    LABORATORY DATA:  CBC    Component Value Date/Time   WBC 9.2 05/29/2017 0938   WBC 11.3 (H) 03/11/2017 1151   RBC 3.94 05/29/2017 0938   RBC 3.44 (L) 03/11/2017 1151   HGB 11.9 05/29/2017 0938   HCT 35.5 05/29/2017 0938   PLT 253 05/29/2017 0938   MCV 90.1 05/29/2017 0938   MCH 30.2 05/29/2017 0938   MCH 32.3 03/11/2017 1151   MCHC 33.5 05/29/2017 0938   MCHC 35.0 03/11/2017 1151   RDW 13.5 05/29/2017 0938   LYMPHSABS 1.2 05/29/2017 0938   MONOABS 0.7 05/29/2017 0938   EOSABS 0.1 05/29/2017 0938   BASOSABS 0.0 05/29/2017 0938    CMP     Component Value Date/Time   NA 139 05/29/2017 0937   K 3.8 05/29/2017 0937   CL 94 (L) 03/11/2017 1151   CO2 23 05/29/2017 0937   GLUCOSE 98 05/29/2017 0937   BUN 20.4 05/29/2017 0937   CREATININE 1.0 05/29/2017 0937   CALCIUM 9.5 05/29/2017 0937   PROT 7.4 05/29/2017 0937   ALBUMIN 3.7 05/29/2017 0937   AST 15 05/29/2017 0937   ALT 11 05/29/2017 0937   ALKPHOS 78 05/29/2017 0937   BILITOT 0.38 05/29/2017 0937   GFRNONAA 44 (L) 03/11/2017 1151   GFRAA 51 (L) 03/11/2017 1151        ASSESSMENT and PLAN:   Malignant neoplasm of upper-outer quadrant of left female breast ([place]) Left lumpectomy 09/06/16: IDC grade 2, 3.4 cm, Margins Neg, 0/1 LN neg, T2N0 ER 90%, PR 70%, Ki-67 30%, HER-2 positive ratio  2.43 (stage 2 A)  Treatment Plan: 1. adjuvant chemotherapy TCHPfollowed by Herceptin-Perjetamaintenance to complete 1 year. 2. Adjuvant radiation therapycompleted 04/03/2017 3. Adjuvant antiestrogen therapy with Tamoxifen x 1 month (unable to tolerate) ----------------------------------------------------------------------------- Current Treatment: Herceptin/Perjeta maintenance  Wrigley is doing well today.  She will proceed with Herceptin  and Perjeta.  I prescribed Doxycycline for her skin, hopefully that will help this rash.  Also, she and I reviewed Anastrozole and I gave her info about Anastrozole to read.  She will think about it, and wait until her next appt in 6 weeks to review with Dr. Lindi Adie before deciding on whether or not to take it.  She is post menopausal.         Drinda will return in 3 weeks for Herceptin/Perjeta, and in 6 weeks for labs, f/u with Dr. Lindi Adie and Herceptin/Perjeta.   All questions were answered. The patient knows to call the clinic with any problems, questions or concerns. We can certainly see the patient much sooner if necessary.  A total of (30) minutes of face-to-face time was spent with this patient with greater than 50% of that time in counseling and care-coordination.  This note was electronically signed. Scot Dock, NP 05/29/2017

## 2017-05-29 NOTE — Telephone Encounter (Signed)
Scheduled patients appts per 9/12 los. Did not want avs or calendar.

## 2017-06-04 ENCOUNTER — Ambulatory Visit: Payer: BLUE CROSS/BLUE SHIELD | Admitting: Neurology

## 2017-06-05 ENCOUNTER — Other Ambulatory Visit: Payer: Self-pay | Admitting: Hematology and Oncology

## 2017-06-05 DIAGNOSIS — Z853 Personal history of malignant neoplasm of breast: Secondary | ICD-10-CM

## 2017-06-05 DIAGNOSIS — Z1231 Encounter for screening mammogram for malignant neoplasm of breast: Secondary | ICD-10-CM

## 2017-06-19 ENCOUNTER — Ambulatory Visit (HOSPITAL_BASED_OUTPATIENT_CLINIC_OR_DEPARTMENT_OTHER): Payer: BLUE CROSS/BLUE SHIELD

## 2017-06-19 ENCOUNTER — Ambulatory Visit: Payer: BLUE CROSS/BLUE SHIELD

## 2017-06-19 ENCOUNTER — Other Ambulatory Visit: Payer: Self-pay | Admitting: Hematology and Oncology

## 2017-06-19 VITALS — BP 129/85 | HR 71 | Temp 98.2°F | Resp 18

## 2017-06-19 DIAGNOSIS — Z170 Estrogen receptor positive status [ER+]: Principal | ICD-10-CM

## 2017-06-19 DIAGNOSIS — Z5112 Encounter for antineoplastic immunotherapy: Secondary | ICD-10-CM | POA: Diagnosis not present

## 2017-06-19 DIAGNOSIS — C50412 Malignant neoplasm of upper-outer quadrant of left female breast: Secondary | ICD-10-CM | POA: Diagnosis not present

## 2017-06-19 DIAGNOSIS — Z95828 Presence of other vascular implants and grafts: Secondary | ICD-10-CM

## 2017-06-19 LAB — COMPREHENSIVE METABOLIC PANEL
ALBUMIN: 3.6 g/dL (ref 3.5–5.0)
ALK PHOS: 86 U/L (ref 40–150)
ALT: 10 U/L (ref 0–55)
AST: 15 U/L (ref 5–34)
Anion Gap: 10 mEq/L (ref 3–11)
BILIRUBIN TOTAL: 0.34 mg/dL (ref 0.20–1.20)
BUN: 18.1 mg/dL (ref 7.0–26.0)
CO2: 24 meq/L (ref 22–29)
Calcium: 9.7 mg/dL (ref 8.4–10.4)
Chloride: 105 mEq/L (ref 98–109)
Creatinine: 1.2 mg/dL — ABNORMAL HIGH (ref 0.6–1.1)
EGFR: 53 mL/min/{1.73_m2} — AB (ref >90)
GLUCOSE: 111 mg/dL (ref 70–140)
Potassium: 3.8 mEq/L (ref 3.5–5.1)
Sodium: 139 mEq/L (ref 136–145)
TOTAL PROTEIN: 7.2 g/dL (ref 6.4–8.3)

## 2017-06-19 LAB — CBC WITH DIFFERENTIAL/PLATELET
BASO%: 0.1 % (ref 0.0–2.0)
BASOS ABS: 0 10*3/uL (ref 0.0–0.1)
EOS%: 2.1 % (ref 0.0–7.0)
Eosinophils Absolute: 0.2 10*3/uL (ref 0.0–0.5)
HEMATOCRIT: 37.4 % (ref 34.8–46.6)
HGB: 12.6 g/dL (ref 11.6–15.9)
LYMPH#: 1.4 10*3/uL (ref 0.9–3.3)
LYMPH%: 18.4 % (ref 14.0–49.7)
MCH: 30.3 pg (ref 25.1–34.0)
MCHC: 33.7 g/dL (ref 31.5–36.0)
MCV: 89.9 fL (ref 79.5–101.0)
MONO#: 0.5 10*3/uL (ref 0.1–0.9)
MONO%: 6.6 % (ref 0.0–14.0)
NEUT#: 5.6 10*3/uL (ref 1.5–6.5)
NEUT%: 72.8 % (ref 38.4–76.8)
PLATELETS: 268 10*3/uL (ref 145–400)
RBC: 4.16 10*6/uL (ref 3.70–5.45)
RDW: 13.6 % (ref 11.2–14.5)
WBC: 7.7 10*3/uL (ref 3.9–10.3)

## 2017-06-19 MED ORDER — SODIUM CHLORIDE 0.9 % IV SOLN
420.0000 mg | Freq: Once | INTRAVENOUS | Status: AC
  Administered 2017-06-19: 420 mg via INTRAVENOUS
  Filled 2017-06-19: qty 14

## 2017-06-19 MED ORDER — SODIUM CHLORIDE 0.9% FLUSH
10.0000 mL | INTRAVENOUS | Status: DC | PRN
  Administered 2017-06-19: 10 mL
  Filled 2017-06-19: qty 10

## 2017-06-19 MED ORDER — DIPHENHYDRAMINE HCL 25 MG PO CAPS
50.0000 mg | ORAL_CAPSULE | Freq: Once | ORAL | Status: AC
  Administered 2017-06-19: 50 mg via ORAL

## 2017-06-19 MED ORDER — SODIUM CHLORIDE 0.9% FLUSH
10.0000 mL | INTRAVENOUS | Status: DC | PRN
  Administered 2017-06-19: 10 mL via INTRAVENOUS
  Filled 2017-06-19: qty 10

## 2017-06-19 MED ORDER — ACETAMINOPHEN 325 MG PO TABS
ORAL_TABLET | ORAL | Status: AC
  Filled 2017-06-19: qty 2

## 2017-06-19 MED ORDER — HEPARIN SOD (PORK) LOCK FLUSH 100 UNIT/ML IV SOLN
500.0000 [IU] | Freq: Once | INTRAVENOUS | Status: AC | PRN
  Administered 2017-06-19: 500 [IU]
  Filled 2017-06-19: qty 5

## 2017-06-19 MED ORDER — DIPHENHYDRAMINE HCL 25 MG PO CAPS
ORAL_CAPSULE | ORAL | Status: AC
  Filled 2017-06-19: qty 2

## 2017-06-19 MED ORDER — ACETAMINOPHEN 325 MG PO TABS
650.0000 mg | ORAL_TABLET | Freq: Once | ORAL | Status: AC
  Administered 2017-06-19: 650 mg via ORAL

## 2017-06-19 MED ORDER — SODIUM CHLORIDE 0.9 % IV SOLN
600.0000 mg | Freq: Once | INTRAVENOUS | Status: AC
  Administered 2017-06-19: 600 mg via INTRAVENOUS
  Filled 2017-06-19: qty 28.57

## 2017-06-19 MED ORDER — SODIUM CHLORIDE 0.9 % IV SOLN
Freq: Once | INTRAVENOUS | Status: AC
  Administered 2017-06-19: 09:00:00 via INTRAVENOUS

## 2017-06-19 MED ORDER — CYANOCOBALAMIN 1000 MCG/ML IJ SOLN
1000.0000 ug | Freq: Once | INTRAMUSCULAR | Status: AC
  Administered 2017-06-19: 1000 ug via INTRAMUSCULAR

## 2017-06-19 MED ORDER — CYANOCOBALAMIN 1000 MCG/ML IJ SOLN
INTRAMUSCULAR | Status: AC
  Filled 2017-06-19: qty 1

## 2017-06-19 MED ORDER — PREGABALIN 25 MG PO CAPS: 25.0000 mg | ORAL_CAPSULE | Freq: Every day | ORAL | 0 refills | Status: DC

## 2017-06-19 NOTE — Patient Instructions (Signed)
[facility] Discharge Instructions for Patients Receiving Chemotherapy  Today you received the following chemotherapy agents Herceptin; Perjeta  To help prevent nausea and vomiting after your treatment, we encourage you to take your nausea medication as directed   If you develop nausea and vomiting that is not controlled by your nausea medication, call the clinic.   BELOW ARE SYMPTOMS THAT SHOULD BE REPORTED IMMEDIATELY:  *FEVER GREATER THAN 100.5 F  *CHILLS WITH OR WITHOUT FEVER  NAUSEA AND VOMITING THAT IS NOT CONTROLLED WITH YOUR NAUSEA MEDICATION  *UNUSUAL SHORTNESS OF BREATH  *UNUSUAL BRUISING OR BLEEDING  TENDERNESS IN MOUTH AND THROAT WITH OR WITHOUT PRESENCE OF ULCERS  *URINARY PROBLEMS  *BOWEL PROBLEMS  UNUSUAL RASH Items with * indicate a potential emergency and should be followed up as soon as possible.  Feel free to call the clinic should you have any questions or concerns. The clinic phone number is [phone].  Please show the CHEMO ALERT CARD at check-in to the Emergency Department and triage nurse.   

## 2017-06-19 NOTE — Progress Notes (Signed)
Patient verbalized she doesn't feel that neuropathy has improved and "at times I feel like it's getting worse". Dr. Lindi Adie aware. Patient states she is unable to take Gabapentin and Lyrica. Dr. Lindi Adie to order monthly Vitamin B12 injection; beginning today. Patient verbalizes "ok" in response. Smith Mince, BSN, RN

## 2017-06-19 NOTE — Progress Notes (Signed)
Pt tolerated infusion well. Pt monitored 30 minutes post infusion. Pt and VS stable at discharge.  

## 2017-06-25 ENCOUNTER — Encounter: Payer: Self-pay | Admitting: Internal Medicine

## 2017-06-25 MED ORDER — HYDROCHLOROTHIAZIDE 25 MG PO TABS: 25.0000 mg | ORAL_TABLET | Freq: Every day | ORAL | 0 refills | Status: DC

## 2017-06-25 NOTE — Telephone Encounter (Signed)
Last office visit 06/25/2016.  Refill?

## 2017-06-25 NOTE — Telephone Encounter (Signed)
Will send in a 30 day supply. She needs to make an appt for a physical or followup or no refills will be provided

## 2017-07-10 ENCOUNTER — Ambulatory Visit (HOSPITAL_BASED_OUTPATIENT_CLINIC_OR_DEPARTMENT_OTHER): Payer: BLUE CROSS/BLUE SHIELD | Admitting: Hematology and Oncology

## 2017-07-10 ENCOUNTER — Other Ambulatory Visit (HOSPITAL_BASED_OUTPATIENT_CLINIC_OR_DEPARTMENT_OTHER): Payer: BLUE CROSS/BLUE SHIELD

## 2017-07-10 ENCOUNTER — Encounter: Payer: Self-pay | Admitting: Hematology and Oncology

## 2017-07-10 ENCOUNTER — Ambulatory Visit: Payer: BLUE CROSS/BLUE SHIELD

## 2017-07-10 ENCOUNTER — Ambulatory Visit (HOSPITAL_BASED_OUTPATIENT_CLINIC_OR_DEPARTMENT_OTHER): Payer: BLUE CROSS/BLUE SHIELD

## 2017-07-10 VITALS — BP 125/93 | HR 79

## 2017-07-10 DIAGNOSIS — Z170 Estrogen receptor positive status [ER+]: Principal | ICD-10-CM

## 2017-07-10 DIAGNOSIS — G629 Polyneuropathy, unspecified: Secondary | ICD-10-CM

## 2017-07-10 DIAGNOSIS — Z95828 Presence of other vascular implants and grafts: Secondary | ICD-10-CM

## 2017-07-10 DIAGNOSIS — Z5112 Encounter for antineoplastic immunotherapy: Secondary | ICD-10-CM

## 2017-07-10 DIAGNOSIS — C50412 Malignant neoplasm of upper-outer quadrant of left female breast: Secondary | ICD-10-CM

## 2017-07-10 DIAGNOSIS — Z79810 Long term (current) use of selective estrogen receptor modulators (SERMs): Secondary | ICD-10-CM

## 2017-07-10 LAB — CBC WITH DIFFERENTIAL/PLATELET
BASO%: 0.1 % (ref 0.0–2.0)
Basophils Absolute: 0 10*3/uL (ref 0.0–0.1)
EOS%: 1.7 % (ref 0.0–7.0)
Eosinophils Absolute: 0.1 10*3/uL (ref 0.0–0.5)
HCT: 40.2 % (ref 34.8–46.6)
HGB: 13.5 g/dL (ref 11.6–15.9)
LYMPH%: 19.2 % (ref 14.0–49.7)
MCH: 29.8 pg (ref 25.1–34.0)
MCHC: 33.6 g/dL (ref 31.5–36.0)
MCV: 88.7 fL (ref 79.5–101.0)
MONO#: 0.6 10*3/uL (ref 0.1–0.9)
MONO%: 7.1 % (ref 0.0–14.0)
NEUT%: 71.9 % (ref 38.4–76.8)
NEUTROS ABS: 5.8 10*3/uL (ref 1.5–6.5)
Platelets: 273 10*3/uL (ref 145–400)
RBC: 4.53 10*6/uL (ref 3.70–5.45)
RDW: 13.5 % (ref 11.2–14.5)
WBC: 8 10*3/uL (ref 3.9–10.3)
lymph#: 1.5 10*3/uL (ref 0.9–3.3)
nRBC: 0 % (ref 0–0)

## 2017-07-10 LAB — COMPREHENSIVE METABOLIC PANEL
ALBUMIN: 3.8 g/dL (ref 3.5–5.0)
ALK PHOS: 80 U/L (ref 40–150)
ALT: 11 U/L (ref 0–55)
AST: 15 U/L (ref 5–34)
Anion Gap: 9 mEq/L (ref 3–11)
BILIRUBIN TOTAL: 0.31 mg/dL (ref 0.20–1.20)
BUN: 17.3 mg/dL (ref 7.0–26.0)
CALCIUM: 9.7 mg/dL (ref 8.4–10.4)
CO2: 25 mEq/L (ref 22–29)
CREATININE: 1 mg/dL (ref 0.6–1.1)
Chloride: 103 mEq/L (ref 98–109)
EGFR: >60 mL/min/{1.73_m2} (ref >60)
Glucose: 97 mg/dl (ref 70–140)
POTASSIUM: 4.1 meq/L (ref 3.5–5.1)
Sodium: 137 mEq/L (ref 136–145)
TOTAL PROTEIN: 7.5 g/dL (ref 6.4–8.3)

## 2017-07-10 MED ORDER — SODIUM CHLORIDE 0.9% FLUSH
10.0000 mL | INTRAVENOUS | Status: DC | PRN
Start: 2017-07-10 — End: 2017-07-10
  Administered 2017-07-10: 10 mL via INTRAVENOUS
  Filled 2017-07-10: qty 10

## 2017-07-10 MED ORDER — DIPHENHYDRAMINE HCL 25 MG PO CAPS
ORAL_CAPSULE | ORAL | Status: AC
  Filled 2017-07-10: qty 2

## 2017-07-10 MED ORDER — ACETAMINOPHEN 325 MG PO TABS
650.0000 mg | ORAL_TABLET | Freq: Once | ORAL | Status: AC
  Administered 2017-07-10: 650 mg via ORAL

## 2017-07-10 MED ORDER — ALTEPLASE 2 MG IJ SOLR
2.0000 mg | Freq: Once | INTRAMUSCULAR | Status: DC | PRN
  Filled 2017-07-10: qty 2

## 2017-07-10 MED ORDER — HEPARIN SOD (PORK) LOCK FLUSH 100 UNIT/ML IV SOLN
500.0000 [IU] | Freq: Once | INTRAVENOUS | Status: AC | PRN
  Administered 2017-07-10: 500 [IU]
  Filled 2017-07-10: qty 5

## 2017-07-10 MED ORDER — SODIUM CHLORIDE 0.9 % IV SOLN
420.0000 mg | Freq: Once | INTRAVENOUS | Status: AC
  Administered 2017-07-10: 420 mg via INTRAVENOUS
  Filled 2017-07-10: qty 14

## 2017-07-10 MED ORDER — DIPHENHYDRAMINE HCL 25 MG PO CAPS
50.0000 mg | ORAL_CAPSULE | Freq: Once | ORAL | Status: AC
  Administered 2017-07-10: 50 mg via ORAL

## 2017-07-10 MED ORDER — VALACYCLOVIR HCL 1 G PO TABS: 1000.0000 mg | ORAL_TABLET | Freq: Two times a day (BID) | ORAL | 0 refills | Status: DC

## 2017-07-10 MED ORDER — ACETAMINOPHEN 325 MG PO TABS
ORAL_TABLET | ORAL | Status: AC
  Filled 2017-07-10: qty 2

## 2017-07-10 MED ORDER — SODIUM CHLORIDE 0.9 % IV SOLN
Freq: Once | INTRAVENOUS | Status: AC
  Administered 2017-07-10: 10:00:00 via INTRAVENOUS

## 2017-07-10 MED ORDER — SODIUM CHLORIDE 0.9 % IV SOLN
600.0000 mg | Freq: Once | INTRAVENOUS | Status: AC
  Administered 2017-07-10: 600 mg via INTRAVENOUS
  Filled 2017-07-10: qty 28.57

## 2017-07-10 MED ORDER — SODIUM CHLORIDE 0.9% FLUSH
10.0000 mL | INTRAVENOUS | Status: DC | PRN
  Administered 2017-07-10: 10 mL
  Filled 2017-07-10: qty 10

## 2017-07-10 NOTE — Progress Notes (Signed)
Patient Care Team: [name], NP as PCP - General (Internal Medicine) [name], MD as Consulting Physician (General Surgery) Nicholas Lose, MD as Consulting Physician (Hematology and Oncology) Gery Pray, MD as Consulting Physician (Radiation Oncology) Delice Bison Charlestine Massed, NP as Nurse Practitioner (Hematology and Oncology) Larey Dresser, MD as Consulting Physician (Cardiology)  DIAGNOSIS:  Encounter Diagnosis  Name Primary?  . Malignant neoplasm of upper-outer quadrant of left breast in female, estrogen receptor positive ([place])     SUMMARY OF ONCOLOGIC HISTORY:   Malignant neoplasm of upper-outer quadrant of left female breast ([place])   08/21/2016 Initial Diagnosis    Screening detected left breast mass UOQ at 1:00: 1.2 x 1 x 0.5 cm, axilla negative Left breast biopsy 1:00: IDC with DCIS, grade 1, ER 90%, PR 70%, Ki-67 30%, HER-2 positive ratio 2.43, T1c N0 stage IA clinical stage      09/06/2016 Surgery    Left lumpectomy: IDC grade 2, 3.4 cm, Margins Neg, 0/1 LN neg, ER 90%, PR 70%, Ki-67 30%, HER-2 positive ratio 2.43 T2N0 (stage 2 A)      10/09/2016 -  Chemotherapy    TCH Perjeta 6 cycles (Dose reduced due to peripheral neuropathy/neutropenia, last cycle without Taxotere due  followed by Herceptin Perjeta maintenance for 1 year      12/20/2016 Genetic Testing    Genetic counseling and testing for hereditary cancer syndromes performed on 11/22/2016. Results are negative for pathogenic mutations in 46 genes analyzed by [facility]'s Common Hereditary Cancers Panel. Results are dated 12/20/2016. Genes tested: APC, ATM, AXIN2, BARD1, BMPR1A, BRCA1, BRCA2, BRIP1, CDH1, CDKN2A, CHEK2, CTNNA1, DICER1, EPCAM, GREM1, KIT, MEN1, MLH1, MSH2, MSH3, MSH6, MUTYH, NBN, NF1, NTHL1, PALB2, PDGFRA, PMS2, POLD1, POLE, PTEN, RAD50, RAD51C, RAD51D, SDHA, SDHB, SDHC, SDHD, SMAD4, SMARCA4, STK11, TP53, TSC1, TSC2, and VHL.  A variant of uncertain significance (not clinically actionable)  was noted in the KIT gene called c.2601C>G (p.Ser867Arg). At this time, it is unknown if this variant is associated with increased cancer risk or if this is a normal finding. It should not be used to make medical management decisions.      02/14/2017 - 04/03/2017 Radiation Therapy    Adjuvant radiation ([facility]): The left breast was treated to 60.4 Gy, with a primary dose of 50.4 Gy delivered in 28 fractions of 1.8 Gy and a boost dose of 10 Gy delivered in 5 fractions of 2 Gy.      04/2017 - 05/2017 Anti-estrogen oral therapy    Tamoxifen daily        CHIEF COMPLIANT:   INTERVAL HISTORY: Stacy [name] is a  REVIEW OF SYSTEMS:   Constitutional: Denies fevers, chills or abnormal weight loss Eyes: Denies blurriness of vision Ears, nose, mouth, throat, and face: Denies mucositis or sore throat Respiratory: Denies cough, dyspnea or wheezes Cardiovascular: Denies palpitation, chest discomfort Gastrointestinal:  Denies nausea, heartburn or change in bowel habits Skin: Extensive maculopapular skin rash on her arms chest and legs. Lymphatics: Denies new lymphadenopathy or easy bruising Neurological: Severe peripheral sensory neuropathy Behavioral/Psych: Mood is stable, no new changes  Extremities: No lower extremity edema Breast: Complains of pain in the left breast. All other systems were reviewed with the patient and are negative.  I have reviewed the past medical history, past surgical history, social history and family history with the patient and they are unchanged from previous note.  ALLERGIES:  is allergic to gabapentin; naproxen; neulasta [pegfilgrastim]; erythromycin; and tramadol hcl.  MEDICATIONS:  Current Outpatient  Prescriptions  Medication Sig Dispense Refill  . doxycycline (VIBRA-TABS) 100 MG tablet Take 1 tablet (100 mg total) by mouth 2 (two) times daily. 14 tablet 0  . hydrochlorothiazide (HYDRODIURIL) 25 MG tablet Take 1 tablet (25 mg total) by mouth daily. 30 tablet 0    . ibuprofen (ADVIL,MOTRIN) 200 MG tablet Take 400 mg by mouth every 4 (four) hours as needed for headache, mild pain or moderate pain.     Marland Kitchen lidocaine-prilocaine (EMLA) cream Apply to affected area once 30 g 3  . tamoxifen (NOLVADEX) 20 MG tablet Take 1 tablet (20 mg total) by mouth daily. (Patient not taking: Reported on 05/29/2017) 30 tablet 5   No current facility-administered medications for this visit.    Facility-Administered Medications Ordered in Other Visits  Medication Dose Route Frequency Provider Last Rate Last Dose  . sodium chloride flush (NS) 0.9 % injection 10 mL  10 mL Intracatheter PRN Nicholas Lose, MD   10 mL at 02/13/17 1236  . sodium chloride flush (NS) 0.9 % injection 10 mL  10 mL Intravenous PRN Nicholas Lose, MD   10 mL at 03/06/17 1245    PHYSICAL EXAMINATION: ECOG PERFORMANCE STATUS: 1 - Symptomatic but completely ambulatory  Vitals:   07/10/17 0911  BP: (!) 136/101  Pulse: 81  Resp: 20  Temp: 97.9 F (36.6 C)  SpO2: 97%   Filed Weights   07/10/17 0911  Weight: 220 lb 9.6 oz (100.1 kg)    GENERAL:alert, no distress and comfortable SKIN: skin color, texture, turgor are normal, no rashes or significant lesions EYES: normal, Conjunctiva are pink and non-injected, sclera clear OROPHARYNX:no exudate, no erythema and lips, buccal mucosa, and tongue normal  NECK: supple, thyroid normal size, non-tender, without nodularity LYMPH:  no palpable lymphadenopathy in the cervical, axillary or inguinal LUNGS: clear to auscultation and percussion with normal breathing effort HEART: regular rate & rhythm and no murmurs and no lower extremity edema ABDOMEN:abdomen soft, non-tender and normal bowel sounds MUSCULOSKELETAL:no cyanosis of digits and no clubbing  NEURO: Severe peripheral sensory neuropathy EXTREMITIES: No lower extremity edema  LABORATORY DATA:  I have reviewed the data as listed   Chemistry      Component Value Date/Time   NA 139 06/19/2017 0831    K 3.8 06/19/2017 0831   CL 94 (L) 03/11/2017 1151   CO2 24 06/19/2017 0831   BUN 18.1 06/19/2017 0831   CREATININE 1.2 (H) 06/19/2017 0831      Component Value Date/Time   CALCIUM 9.7 06/19/2017 0831   ALKPHOS 86 06/19/2017 0831   AST 15 06/19/2017 0831   ALT 10 06/19/2017 0831   BILITOT 0.34 06/19/2017 0831       Lab Results  Component Value Date   WBC 8.0 07/10/2017   HGB 13.5 07/10/2017   HCT 40.2 07/10/2017   MCV 88.7 07/10/2017   PLT 273 07/10/2017   NEUTROABS 5.8 07/10/2017    ASSESSMENT & PLAN:  Malignant neoplasm of upper-outer quadrant of left female breast ([facility]) Left lumpectomy 09/06/16: IDC grade 2, 3.4 cm, Margins Neg, 0/1 LN neg, T2N0 ER 90%, PR 70%, Ki-67 30%, HER-2 positive ratio 2.43 (stage 2 A)  Treatment Plan: 1. adjuvant chemotherapy TCHPfollowed by Herceptin-Perjetamaintenance to complete 1 year. 2. Adjuvant radiation therapycompleted 04/03/2017 3. Adjuvant antiestrogen therapy with Tamoxifen x 1 month (unable to tolerate) ----------------------------------------------------------------------------- Current Treatment: Herceptin/Perjeta maintenance We recommend that she take anastrozole anti- estrogen therapy. Patient does not want to start anything at this time.  Severe fatigue:  This comes and goes. Skin rash: Maculopapular diffusely throughout her body.  I suspected bug bites and I asked her to check her home for bedbugs. If there are no bedbugs then I would consider this to be a viral illness and I will start her on Valtrex 1000 mg p.o. twice daily for 7 days  Severe peripheral neuropathy: Patient cannot take Neurontin R Cymbalta or Lyrica.  I discussed with her about B6 oral vitamin supplement.  B12 also did not appear to help her previously.  Return to clinic every 3 weeks for Herceptin Perjeta and every 6 weeks with labs and follow-up with me.  At that time I will re-discussed with her about anastrozole therapy   I spent 25 minutes  talking to the patient of which more than half was spent in counseling and coordination of care.  No orders of the defined types were placed in this encounter.  The patient has a good understanding of the overall plan. she agrees with it. she will call with any problems that may develop before the next visit here.   Rulon Eisenmenger, MD 07/10/17

## 2017-07-10 NOTE — Assessment & Plan Note (Signed)
Left lumpectomy 09/06/16: IDC grade 2, 3.4 cm, Margins Neg, 0/1 LN neg, T2N0 ER 90%, PR 70%, Ki-67 30%, HER-2 positive ratio 2.43 (stage 2 A)  Treatment Plan: 1. adjuvant chemotherapy TCHPfollowed by Herceptin-Perjetamaintenance to complete 1 year. 2. Adjuvant radiation therapycompleted 04/03/2017 3. Adjuvant antiestrogen therapy with Tamoxifen x 1 month (unable to tolerate) ----------------------------------------------------------------------------- Current Treatment: Herceptin/Perjeta maintenance We recommend that she take anastrozole and to estrogen therapy.  Return to clinic every 3 weeks for Herceptin Perjeta and every 6 weeks with labs and follow-up with me

## 2017-07-10 NOTE — Progress Notes (Signed)
Per May, RN Dr. Lindi Adie states is ok to treat patient with BP of 125/93.

## 2017-07-10 NOTE — Progress Notes (Signed)
Pt. Informed that she should remain for observation for 30 minutes post treatment. Pt stated she did not want to stay 30 min for post observation.

## 2017-07-10 NOTE — Patient Instructions (Signed)
[facility] Discharge Instructions for Patients Receiving Chemotherapy  Today you received the following chemotherapy agents :  Herceptin, Perjeta.  To help prevent nausea and vomiting after your treatment, we encourage you to take your nausea medication as prescribed.   If you develop nausea and vomiting that is not controlled by your nausea medication, call the clinic.   BELOW ARE SYMPTOMS THAT SHOULD BE REPORTED IMMEDIATELY:  *FEVER GREATER THAN 100.5 F  *CHILLS WITH OR WITHOUT FEVER  NAUSEA AND VOMITING THAT IS NOT CONTROLLED WITH YOUR NAUSEA MEDICATION  *UNUSUAL SHORTNESS OF BREATH  *UNUSUAL BRUISING OR BLEEDING  TENDERNESS IN MOUTH AND THROAT WITH OR WITHOUT PRESENCE OF ULCERS  *URINARY PROBLEMS  *BOWEL PROBLEMS  UNUSUAL RASH Items with * indicate a potential emergency and should be followed up as soon as possible.  Feel free to call the clinic should you have any questions or concerns. The clinic phone number is [phone].  Please show the CHEMO ALERT CARD at check-in to the Emergency Department and triage nurse.   

## 2017-07-12 ENCOUNTER — Telehealth: Payer: Self-pay | Admitting: Hematology and Oncology

## 2017-07-12 NOTE — Telephone Encounter (Signed)
Spoke with patient about appts added per 10/24 los.

## 2017-07-15 ENCOUNTER — Ambulatory Visit (INDEPENDENT_AMBULATORY_CARE_PROVIDER_SITE_OTHER): Payer: BLUE CROSS/BLUE SHIELD | Admitting: Family Medicine

## 2017-07-15 ENCOUNTER — Encounter: Payer: Self-pay | Admitting: Family Medicine

## 2017-07-15 VITALS — BP 144/82 | HR 85 | Temp 98.0°F | Wt 221.0 lb

## 2017-07-15 DIAGNOSIS — L02413 Cutaneous abscess of right upper limb: Secondary | ICD-10-CM | POA: Diagnosis not present

## 2017-07-15 MED ORDER — DIPHENHYDRAMINE HCL 25 MG PO CAPS
ORAL_CAPSULE | ORAL | Status: AC
  Filled 2017-07-15: qty 1

## 2017-07-15 MED ORDER — DOXYCYCLINE HYCLATE 100 MG PO CAPS: 100.0000 mg | ORAL_CAPSULE | Freq: Two times a day (BID) | ORAL | 0 refills | Status: DC

## 2017-07-15 NOTE — Patient Instructions (Signed)

## 2017-07-15 NOTE — Progress Notes (Signed)
 Subjective:    Patient ID: Stacy [name], female    DOB: 11/24/1960, 56 y.o.   MRN: [id number]  HPI This is a 56 yo female who presents today with area of right wrist swelling, redness x 4-5 days.   Is currently on Herceptin/Perjeta injections for breast cancer. Over last year had chemo/radiation. Was started on tamoxifin but noticed some lesions on her arms and left breast so Tamoxifin was stoped. Was put on doxycylcine a couple of weeks ago with some resolution- she did not feel that doxy improved lesions, just that they got better on their own. Lesion on right wrist more raised. Slight pain distal to lesion. No streaking or fevers. No drainage. Has been applying peroxide.   Past Medical History:  Diagnosis Date  . Anxiety   . Arthritis   . Complication of anesthesia    DIFFICULTY WAKING UP , LAST SURGERY NECK 2012 WAS FINE PER PT HERE AT [facility]  . Depression   . Fibromyalgia   . Genetic testing 12/21/2016   Genetic testing reported out on 12/20/2016 through [facility]'s 46-gene Common Hereditary Cancers panel found no deleterious mutations. The following 46 genes were analyzed: APC, ATM, AXIN2, BARD1, BMPR1A, BRCA1, BRCA2, BRIP1, CDH1, CDKN2A, CHEK2, CTNNA1, DICER1, EPCAM, GREM1, KIT, MEN1, MLH1, MSH2, MSH3, MSH6, MUTYH, NBN, NF1, NTHL1, PALB2, PDGFRA, PMS2, POLD1, POLE, PTEN, RAD50, RAD51C, RAD51D, SDH  . Headache    HX MIGRAINES    . Heart murmur   . History of radiation therapy 02/14/17-04/03/2017   left breast 60.4 Gy: 50.4 Gy delivered in 28 fractions and a boost of 10 Gy delivered in 5 fractions.  . Hypertension   . Malignant neoplasm of upper-outer quadrant of left female breast (HCC) 08/23/2016  . MVP (mitral valve prolapse)    AS INFANT  . Neck pain   . Vision abnormalities    Past Surgical History:  Procedure Laterality Date  . cervical fusiion  2012  . JOINT REPLACEMENT    . MITRAL VALVE REPAIR     MVP 1970  . PORTACATH PLACEMENT Right 09/06/2016   Procedure: INSERTION  PORT-A-CATH;  Surgeon: Faera Byerly, MD;  Location: [facility];  Service: General;  Laterality: Right;  . RADIOACTIVE SEED GUIDED MASTECTOMY WITH AXILLARY SENTINEL LYMPH NODE BIOPSY Left 09/06/2016   Procedure: RADIOACTIVE SEED GUIDED LEFT BREAST LUMPECTOMY  WITH SENTINEL LYMPH NODE BIOPSY;  Surgeon: Faera Byerly, MD;  Location: [facility];  Service: General;  Laterality: Left;  . ROTATOR CUFF REPAIR Right 2012  . TOTAL HIP ARTHROPLASTY Right 09/14/2015   Procedure: RIGHT TOTAL HIP ARTHROPLASTY ANTERIOR APPROACH;  Surgeon: Naiping M Xu, MD;  Location: [facility] OR;  Service: Orthopedics;  Laterality: Right;  . TUBAL LIGATION  1988   Family History  Problem Relation Age of Onset  . Adopted: Yes  . Breast cancer Brother 50       Maternal half-brother. Currently 59.   Social History  Substance Use Topics  . Smoking status: Former Smoker    Packs/day: 0.25    Types: Cigarettes    Quit date: 01/13/2017  . Smokeless tobacco: Never Used  . Alcohol use No      Review of Systems Per HPI    Objective:   Physical Exam  Constitutional: She is oriented to person, place, and time. She appears well-developed and well-nourished. No distress.  Eyes: Conjunctivae are normal.  Cardiovascular: Normal rate.   Pulmonary/Chest: Effort normal.  Neurological: She is alert and oriented to person,   place, and time.  Skin: Skin is warm and dry. She is not diaphoretic.  Area distal to right wrist with significantly raised, erythematous, firm area. Top debrieded. No fluctuance, no drainage. Area of elevation 3 cm, top area of erythema approximately 1 cm. Picture available in media section of chart.   Psychiatric: She has a normal mood and affect. Her behavior is normal. Judgment and thought content normal.  Vitals reviewed.     BP (!) 144/82   Pulse 85   Temp 98 F (36.7 C) (Oral)   Wt 221 lb (100.2 kg)   LMP 11/07/2016   SpO2 98%   BMI 37.93 kg/m  Wt Readings from Last 3  Encounters:  07/15/17 221 lb (100.2 kg)  07/10/17 220 lb 9.6 oz (100.1 kg)  05/29/17 215 lb 1.6 oz (97.6 kg)       Assessment & Plan:  Discussed with Dr. Duncan who also examined patient 1. Abscess of arm, right -Provided written and verbal information regarding diagnosis and treatment. - strict RTC/ED precautions reviewed - encouraged her to keep area clean and dry, apply warm compresses several times a day, can cover with non-stick bandage when out of house. - doxycycline (VIBRAMYCIN) 100 MG capsule; Take 1 capsule (100 mg total) by mouth 2 (two) times daily.  Dispense: 20 capsule; Refill: 0 - she is to follow up via Mychart or telephone in 48 hours to provide update on condition.  Deborah Gessner, FNP-BC  [facility] [place], [facility]  07/16/2017 4:55 PM  

## 2017-07-17 ENCOUNTER — Encounter: Payer: Self-pay | Admitting: Family Medicine

## 2017-07-17 ENCOUNTER — Ambulatory Visit (INDEPENDENT_AMBULATORY_CARE_PROVIDER_SITE_OTHER): Payer: BLUE CROSS/BLUE SHIELD | Admitting: Neurology

## 2017-07-17 ENCOUNTER — Encounter: Payer: Self-pay | Admitting: Neurology

## 2017-07-17 VITALS — BP 145/94 | HR 91 | Resp 18 | Ht 64.0 in | Wt 223.0 lb

## 2017-07-17 DIAGNOSIS — R200 Anesthesia of skin: Secondary | ICD-10-CM

## 2017-07-17 DIAGNOSIS — G5139 Clonic hemifacial spasm, unspecified: Secondary | ICD-10-CM | POA: Diagnosis not present

## 2017-07-17 DIAGNOSIS — M797 Fibromyalgia: Secondary | ICD-10-CM

## 2017-07-17 DIAGNOSIS — R269 Unspecified abnormalities of gait and mobility: Secondary | ICD-10-CM | POA: Diagnosis not present

## 2017-07-17 DIAGNOSIS — R42 Dizziness and giddiness: Secondary | ICD-10-CM | POA: Diagnosis not present

## 2017-07-17 DIAGNOSIS — C50412 Malignant neoplasm of upper-outer quadrant of left female breast: Secondary | ICD-10-CM

## 2017-07-17 DIAGNOSIS — Z170 Estrogen receptor positive status [ER+]: Secondary | ICD-10-CM | POA: Diagnosis not present

## 2017-07-17 NOTE — Patient Instructions (Signed)
Alpha lipoic acid 600 mg twice a day may help neruopathy

## 2017-07-17 NOTE — Progress Notes (Signed)
[facility]  PATIENT: Stacy [name] DOB: 09-Jun-1961  REFERRING DOCTOR OR PCP:  Webb Silversmith SOURCE: patient, records from Ms. Baity, imaging and lab reports, CT scan on the PACS.  _________________________________   HISTORICAL  CHIEF COMPLAINT:  Chief Complaint  Patient presents with  . Numbness    Sts. facial numbness is improved, but she now has spasms in both hands. Improves with massage.  Also c/o intermittent dizziness when sitting from a standing position/fim  . Spasms  . Dizziness    HISTORY OF PRESENT ILLNESS:  Abaigeal Moomaw is a 56 year old woman who has had episodes of right facial spasming since 2017  Update 07/17/2017:   The facial spasms are doing better.   She is getting bilateral hand spasms.    The spasms last 1 minute and occur a few tines a day.   Nothing seems to trigger them.   They seem random.   Massaging her hands improes the symptoms.     She also reports dizziness with spinning upon standing.    This makes her feel off balanced.   It usually occurs upon standing up from sitting.  Orthostatic VS were ok today.    This may occur once or twice a day on average    she notes some numbness in her feet more than the hands. There is no tingling pain associated with numbness in the feet. She notes that her balance is slightly worse now than before the chemotherapy.  She has triple positive Breast cancer    She sees Dr. Lindi Adie.   She did 6 rounds of chemo (including taxotere) and RadRx and is now on Herceptin and Perjeta.      From 10/18/2016: Compared to last visit, she feels the frequency is better.  These last about a minute or two and occur about 3-4 a week.   They occur on her right.    They can be triggered by changing position.      She has shown me a video of her spasm as he was uncertain. I'll obtain an EEG. It was mildly abnormal. There were no seizures or spikes or sharp waves. However, she did have a little slowing on the left  consistent with a higher susceptibility for seizures.   We prescribed Keppra but she was in the midst of all of her breast cancer issues and she opted not to start. She is reluctant to add any more medications as she is already taking several.  She also was diagnosed with Stage 2 breast cancer (Lymph nodes clear by her report)and was placed on herceptin, taxotere and another medication (Dr. Lindi Adie / Elvina Sidle)  A typical episode will last 5 minutes but they can last anywhere from 2-10 minutes. Before an episode she feels lightheadedness for a short period of time. Then she starts to feel numbness that starts around the lips on the right and spreads up to the rest of the face. Then, the face has tonic spasms with her right eye closed and the corner of the right side of her mouth up. This can be a little bit painful for her when it occurs. After a few minutes the spasm begins to improve and a couple minutes later the numbness gets better. During the episodes, she feels strange does not lose consciousness and she is able to understand what people are saying he can form sentences though she feels her voice is altered because of the spasm.   Spasms are not associated with  loss of consciousness and she never gets symptoms in the right arm or leg or the other side of her body. She does not get incontinence or tongue biting associated with the episode.  She has fibromyalgia but is currently not on any treatment.    A review of her medications does not show anything that might lower the seizure threshold.  REVIEW OF SYSTEMS: Constitutional: No fevers, chills, sweats, or change in appetite.   She has insomnia and occasionally feel sleepy. Eyes: No visual changes, double vision, eye pain Ear, nose and throat: No hearing loss, ear pain, nasal congestion, sore throat Cardiovascular: No chest pain, palpitations.   History of valve surgery as a child. Respiratory: No shortness of breath at rest or with exertion.    No wheezes GastrointestinaI: No nausea, vomiting, diarrhea, abdominal pain, fecal incontinence Genitourinary: No dysuria, urinary retention or frequency.  No nocturia. Musculoskeletal: She has generalized myalgia.  No neck pain, back pain Integumentary: She was diagnosed with breast ca and is on chemo.   No rash, pruritus, skin lesions Neurological: as above Psychiatric: No depression at this time.  No anxiety Endocrine: No palpitations, diaphoresis, change in appetite, change in weigh or increased thirst Hematologic/Lymphatic: No anemia, purpura, petechiae. Allergic/Immunologic: No itchy/runny eyes, nasal congestion, recent allergic reactions, rashes  ALLERGIES: Allergies  Allergen Reactions  . Gabapentin Swelling    "makes me feel detached"  . Naproxen Palpitations  . Neulasta [Pegfilgrastim] Itching and Other (See Comments)    Patient stated,"reddish skin tone, body on fire, tingly tongue."  . Erythromycin Nausea And Vomiting  . Tramadol Hcl Other (See Comments)    Headaches    HOME MEDICATIONS:  Current Outpatient Prescriptions:  .  hydrochlorothiazide (HYDRODIURIL) 25 MG tablet, Take 1 tablet (25 mg total) by mouth daily., Disp: 30 tablet, Rfl: 0 .  ibuprofen (ADVIL,MOTRIN) 200 MG tablet, Take 400 mg by mouth every 4 (four) hours as needed for headache, mild pain or moderate pain. , Disp: , Rfl:  .  lidocaine-prilocaine (EMLA) cream, Apply to affected area once, Disp: 30 g, Rfl: 3 No current facility-administered medications for this visit.   Facility-Administered Medications Ordered in Other Visits:  .  sodium chloride flush (NS) 0.9 % injection 10 mL, 10 mL, Intracatheter, PRN, Nicholas Lose, MD, 10 mL at 02/13/17 1236 .  sodium chloride flush (NS) 0.9 % injection 10 mL, 10 mL, Intravenous, PRN, Nicholas Lose, MD, 10 mL at 03/06/17 1245  PAST MEDICAL HISTORY: Past Medical History:  Diagnosis Date  . Anxiety   . Arthritis   . Complication of anesthesia    DIFFICULTY  WAKING UP , LAST SURGERY NECK 2012 WAS FINE PER PT HERE AT [facility]  . Depression   . Fibromyalgia   . Genetic testing 12/21/2016   Genetic testing reported out on 12/20/2016 through [facility]'s 46-gene Common Hereditary Cancers panel found no deleterious mutations. The following 46 genes were analyzed: APC, ATM, AXIN2, BARD1, BMPR1A, BRCA1, BRCA2, BRIP1, CDH1, CDKN2A, CHEK2, CTNNA1, DICER1, EPCAM, GREM1, KIT, MEN1, MLH1, MSH2, MSH3, MSH6, MUTYH, NBN, NF1, NTHL1, PALB2, PDGFRA, PMS2, POLD1, POLE, PTEN, RAD50, RAD51C, RAD51D, SDH  . Headache    HX MIGRAINES    . Heart murmur   . History of radiation therapy 02/14/17-04/03/2017   left breast 60.4 Gy: 50.4 Gy delivered in 28 fractions and a boost of 10 Gy delivered in 5 fractions.  . Hypertension   . Malignant neoplasm of upper-outer quadrant of left female breast ([facility]) 08/23/2016  . MVP (mitral valve  prolapse)    AS INFANT  . Neck pain   . Vision abnormalities     PAST SURGICAL HISTORY: Past Surgical History:  Procedure Laterality Date  . cervical fusiion  2012  . JOINT REPLACEMENT    . MITRAL VALVE REPAIR     MVP 1970  . PORTACATH PLACEMENT Right 09/06/2016   Procedure: INSERTION PORT-A-CATH;  Surgeon: [name], MD;  Location: [facility];  Service: General;  Laterality: Right;  . RADIOACTIVE SEED GUIDED PARTIAL MASTECTOMY WITH AXILLARY SENTINEL LYMPH NODE BIOPSY Left 09/06/2016   Procedure: RADIOACTIVE SEED GUIDED LEFT BREAST LUMPECTOMY  WITH SENTINEL LYMPH NODE BIOPSY;  Surgeon: [name], MD;  Location: [facility];  Service: General;  Laterality: Left;  . ROTATOR CUFF REPAIR Right 2012  . TOTAL HIP ARTHROPLASTY Right 09/14/2015   Procedure: RIGHT TOTAL HIP ARTHROPLASTY ANTERIOR APPROACH;  Surgeon: Leandrew Koyanagi, MD;  Location: [facility];  Service: Orthopedics;  Laterality: Right;  . TUBAL LIGATION  1988    FAMILY HISTORY: Family History  Problem Relation Age of Onset  . Adopted: Yes  . Breast cancer  Brother 76       Maternal half-brother. Currently 39.    SOCIAL HISTORY:  Social History   Social History  . Marital status: Married    Spouse name: N/A  . Number of children: 4  . Years of education: N/A   Occupational History  . Not on file.   Social History Main Topics  . Smoking status: Former Smoker    Packs/day: 0.25    Types: Cigarettes    Quit date: 01/13/2017  . Smokeless tobacco: Never Used  . Alcohol use No  . Drug use: No  . Sexual activity: Yes    Birth control/ protection: Post-menopausal   Other Topics Concern  . Not on file   Social History Narrative  . No narrative on file     PHYSICAL EXAM  Vitals:   07/17/17 1518  BP: (!) 145/94  Pulse: 91  Resp: 18  Weight: 223 lb (101.2 kg)  Height: _0  (1.626 m)    Body mass index is 38.28 kg/m.   General: The patient is well-developed and well-nourished and in no acute distress   Neurologic Exam  Mental status: The patient is alert and oriented x 3 at the time of the examination. The patient has apparent normal recent and remote memory, with an apparently normal attention span and concentration ability.   Speech is normal.  Cranial nerves: Extraocular movements are full. Facial strength and sensation is normal. Trapezius strength is normal  The tongue is midline, and the patient has symmetric elevation of the soft palate. No obvious hearing deficits are noted.  Motor:  Muscle bulk is normal.   Tone is normal. Strength is  5 / 5 in all 4 extremities.   Sensory: She had normal sensation to touch and vibration in the hands. There was reduced sensation to vibration at the toes, to 25% on the right and 50% on the left and at the ankles, 50% both sides. Additionally, she had mild reduced pain sensation to the ankle bilaterally  Coordination: Cerebellar testing reveals good finger-nose-finger and heel-to-shin bilaterally.  Gait and station: Station is normal.   Gait is normal. Tandem gait is wide.  Romberg is negative.   Reflexes: Deep tendon reflexes are symmetric and normal bilaterally.        DIAGNOSTIC DATA (LABS, IMAGING, TESTING) - I reviewed patient records, labs, notes, testing  and imaging myself where available.  Lab Results  Component Value Date   WBC 8.0 07/10/2017   HGB 13.5 07/10/2017   HCT 40.2 07/10/2017   MCV 88.7 07/10/2017   PLT 273 07/10/2017      Component Value Date/Time   NA 137 07/10/2017 0854   K 4.1 07/10/2017 0854   CL 94 (L) 03/11/2017 1151   CO2 25 07/10/2017 0854   GLUCOSE 97 07/10/2017 0854   BUN 17.3 07/10/2017 0854   CREATININE 1.0 07/10/2017 0854   CALCIUM 9.7 07/10/2017 0854   PROT 7.5 07/10/2017 0854   ALBUMIN 3.8 07/10/2017 0854   AST 15 07/10/2017 0854   ALT 11 07/10/2017 0854   ALKPHOS 80 07/10/2017 0854   BILITOT 0.31 07/10/2017 0854   GFRNONAA 44 (L) 03/11/2017 1151   GFRAA 51 (L) 03/11/2017 1151   Lab Results  Component Value Date   CHOL 172 04/20/2016   HDL 59.10 04/20/2016   LDLCALC 88 04/20/2016   TRIG 127.0 04/20/2016   CHOLHDL 3 04/20/2016   Lab Results  Component Value Date   HGBA1C 5.6 04/20/2016   Lab Results  Component Value Date   VITAMINB12 337 04/20/2016   Lab Results  Component Value Date   TSH 1.32 04/20/2016       ASSESSMENT AND PLAN  Dizziness  Fibromyalgia  Facial spasm  Malignant neoplasm of upper-outer quadrant of left breast in female, estrogen receptor positive (HCC)  Numbness  Gait disturbance  1.    She appears to have possible mild polyneuropathy, possibly from Taxotere, symptoms are fairly mild. He will consider taking alpha lipoic acid 600 mg by mouth twice a day at the supplement that may have benefit. If symptoms worsen she will contact us and we will check a nerve conduction/EMG to determine more details and I would also consider checking of the labs at that time. 2.   If spasms worsen, consider baclofen. 3.   She will return to see me as needed. She is advised to  call if she has significant change in her symptoms or new neurologic symptoms.   Tallulah Hosman A. Felecia Shelling, MD, PhD 12/20/5911, 6:85 PM Certified in Neurology, Clinical Neurophysiology, Sleep Medicine, Pain Medicine and Neuroimaging  [facility] [address], [place], [place] [address] [phone]

## 2017-07-22 ENCOUNTER — Other Ambulatory Visit: Payer: Self-pay | Admitting: Internal Medicine

## 2017-07-24 MED ORDER — DEXAMETHASONE SODIUM PHOSPHATE 10 MG/ML IJ SOLN
INTRAMUSCULAR | Status: AC
  Filled 2017-07-24: qty 1

## 2017-07-24 MED ORDER — PALONOSETRON HCL INJECTION 0.25 MG/5ML
INTRAVENOUS | Status: AC
  Filled 2017-07-24: qty 5

## 2017-07-24 MED ORDER — ATROPINE SULFATE 1 MG/ML IJ SOLN
INTRAMUSCULAR | Status: AC
  Filled 2017-07-24: qty 1

## 2017-07-26 ENCOUNTER — Ambulatory Visit (HOSPITAL_COMMUNITY)
Admission: RE | Admit: 2017-07-26 | Discharge: 2017-07-26 | Disposition: A | Discharge status: Home / Self Care | Payer: BLUE CROSS/BLUE SHIELD | Source: Ambulatory Visit | Attending: Cardiology | Admitting: Cardiology

## 2017-07-26 ENCOUNTER — Encounter (HOSPITAL_COMMUNITY): Payer: Self-pay | Admitting: Cardiology

## 2017-07-26 ENCOUNTER — Ambulatory Visit (HOSPITAL_BASED_OUTPATIENT_CLINIC_OR_DEPARTMENT_OTHER)
Admission: RE | Admit: 2017-07-26 | Discharge: 2017-07-26 | Disposition: A | Discharge status: Home / Self Care | Payer: BLUE CROSS/BLUE SHIELD | Source: Ambulatory Visit | Attending: Cardiology | Admitting: Cardiology

## 2017-07-26 VITALS — BP 142/90 | HR 78 | Wt 222.8 lb

## 2017-07-26 DIAGNOSIS — Z9889 Other specified postprocedural states: Secondary | ICD-10-CM | POA: Insufficient documentation

## 2017-07-26 DIAGNOSIS — C50412 Malignant neoplasm of upper-outer quadrant of left female breast: Secondary | ICD-10-CM | POA: Diagnosis not present

## 2017-07-26 DIAGNOSIS — Z853 Personal history of malignant neoplasm of breast: Secondary | ICD-10-CM | POA: Diagnosis not present

## 2017-07-26 DIAGNOSIS — I10 Essential (primary) hypertension: Secondary | ICD-10-CM

## 2017-07-26 DIAGNOSIS — F329 Major depressive disorder, single episode, unspecified: Secondary | ICD-10-CM | POA: Insufficient documentation

## 2017-07-26 DIAGNOSIS — Z87891 Personal history of nicotine dependence: Secondary | ICD-10-CM | POA: Diagnosis not present

## 2017-07-26 DIAGNOSIS — I371 Nonrheumatic pulmonary valve insufficiency: Secondary | ICD-10-CM | POA: Diagnosis not present

## 2017-07-26 DIAGNOSIS — I341 Nonrheumatic mitral (valve) prolapse: Secondary | ICD-10-CM | POA: Insufficient documentation

## 2017-07-26 DIAGNOSIS — Z79899 Other long term (current) drug therapy: Secondary | ICD-10-CM | POA: Insufficient documentation

## 2017-07-26 DIAGNOSIS — M797 Fibromyalgia: Secondary | ICD-10-CM | POA: Insufficient documentation

## 2017-07-26 NOTE — Progress Notes (Signed)
  Echocardiogram 2D Echocardiogram has been performed.  Avory Rahimi L Androw 07/26/2017, 2:50 PM

## 2017-07-26 NOTE — Patient Instructions (Addendum)
Your physician has requested that you have an echocardiogram. Echocardiography is a painless test that uses sound waves to create images of your heart. It provides your doctor with information about the size and shape of your heart and how well your heart's chambers and valves are working. This procedure takes approximately one hour. There are no restrictions for this procedure.   Your physician recommends that you schedule a follow-up appointment in: 3 months with Dr. Aundra Dubin  And an echocardiogram

## 2017-07-28 NOTE — Progress Notes (Signed)
 CARDIO-ONCOLOGY CLINIC NOTE  Oncology: Dr. Gudena  Stacy [name] is a 56 yo with history of mitral valve repair in childhood, HTN, and breast cancer presents for cardio-oncology appointment.  Patient had mitral valve repair for mitral regurgitation at age 8, ?cleft mitral valve.  Recent echoes have appeared to show a normal mitral valve.  She was diagnosed with breast cancer in 12/17.  ER+/PR+/HER2+.  She had left lumpectomy.  Chemotherapy was started in 1/18 with taxol/carboplatin/Herceptin/Perjeta.  She received 6 cyles up front and now receiving 1 year of Herceptin/Perjeta.   Tolerating Herceptin and Perjeta well. No chest pain or exertional dyspnea.  BP mildly elevated today.   PMH: 1. Depression.  2. HTN 3. Fibromyalgia 4. S/p mitral valve repair 1971: Patient was 56 yrs old.  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  5. Breast cancer: Diagnosis in 12/17.  ER+/PR+/HER2+.  She had left lumpectomy.  Chemotherapy was started in 1/18 with taxol/carboplatin/Herceptin/Perjeta.  She will get 6 cyles of this then complete 1 year of Herceptin/Perjeta.  - Echo (12/17): EF 55-60%, moderate LVH, GLS -17%, stable mitral valve repair with no MR.  - Echo (3/18): EF 60-65%, GLS -19.2%, normal mitral valve with no MR, normal RV size and systolic function.  - Echo (7/18): EF 60-65%, GLS -19.5% LS - unable to be read. Normal MV. RV normal.  - Echo (11/18): EF 60-65%, GLS -20.3%, normal diastolic function, no MR.    Social History   Socioeconomic History  . Marital status: Married    Spouse name: Not on file  . Number of children: 4  . Years of education: Not on file  . Highest education level: Not on file  Social Needs  . Financial resource strain: Not on file  . Food insecurity - worry: Not on file  . Food insecurity - inability: Not on file  . Transportation needs - medical: Not on file  . Transportation needs - non-medical: Not on file  Occupational History  . Not on file  Tobacco Use  .  Smoking status: Former Smoker    Packs/day: 0.25    Types: Cigarettes    Last attempt to quit: 01/13/2017    Years since quitting: 0.5  . Smokeless tobacco: Never Used  Substance and Sexual Activity  . Alcohol use: No    Alcohol/week: 0.0 oz  . Drug use: No  . Sexual activity: Yes    Birth control/protection: Post-menopausal  Other Topics Concern  . Not on file  Social History Narrative  . Not on file   Family History  Adopted: Yes  Problem Relation Age of Onset  . Breast cancer Brother 50       Maternal half-brother. Currently 59.   ROS: All systems reviewed and negative except as per HPI.   Current Outpatient Medications  Medication Sig Dispense Refill  . hydrochlorothiazide (HYDRODIURIL) 25 MG tablet Take 1 tablet (25 mg total) daily by mouth. MUST SCHEDULE ANNUAL EXAM 30 tablet 1  . ibuprofen (ADVIL,MOTRIN) 200 MG tablet Take 400 mg by mouth every 4 (four) hours as needed for headache, mild pain or moderate pain.     . lidocaine-prilocaine (EMLA) cream Apply to affected area once 30 g 3   No current facility-administered medications for this encounter.    Facility-Administered Medications Ordered in Other Encounters  Medication Dose Route Frequency Provider Last Rate Last Dose  . sodium chloride flush (NS) 0.9 % injection 10 mL  10 mL Intracatheter PRN Gudena, Vinay, MD   10   mL at 02/13/17 1236  . sodium chloride flush (NS) 0.9 % injection 10 mL  10 mL Intravenous PRN Gudena, Vinay, MD   10 mL at 03/06/17 1245   BP (!) 142/90   Pulse 78   Wt 222 lb 12.8 oz (101.1 kg)   LMP 11/07/2016   SpO2 94%   BMI 38.24 kg/m  General: NAD Neck: No JVD, no thyromegaly or thyroid nodule.  Lungs: Clear to auscultation bilaterally with normal respiratory effort. CV: Nondisplaced PMI.  Heart regular S1/S2, no S3/S4, no murmur.  No peripheral edema.  No carotid bruit.  Normal pedal pulses.  Abdomen: Soft, nontender, no hepatosplenomegaly, no distention.  Skin: Intact without lesions  or rashes.  Neurologic: Alert and oriented x 3.  Psych: Normal affect. Extremities: No clubbing or cyanosis.  HEENT: Normal.   Assessment/Plan: 1. Breast cancer:  She will be getting Herceptin until 1/19.  I reviewed today's echo, EF and strain were normal.  She will have 1 additional screening echo in 3 months.   2. HTN: BP mildly elevated but she does not want an additional antihypertensive. She is going to try to lose weight.  3. S/p surgical mitral valve repair: This was done in childhood for mitral regurgitation.  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  Echo today showed no mitral regurgitation. There was no obvious MV abnormality.     MD 07/28/2017    

## 2017-07-29 ENCOUNTER — Encounter: Payer: Self-pay | Admitting: Family Medicine

## 2017-07-31 ENCOUNTER — Other Ambulatory Visit: Payer: Self-pay

## 2017-07-31 ENCOUNTER — Ambulatory Visit (HOSPITAL_BASED_OUTPATIENT_CLINIC_OR_DEPARTMENT_OTHER): Payer: BLUE CROSS/BLUE SHIELD

## 2017-07-31 ENCOUNTER — Other Ambulatory Visit (HOSPITAL_COMMUNITY): Payer: Self-pay

## 2017-07-31 VITALS — BP 135/91 | HR 86 | Temp 98.2°F | Resp 18

## 2017-07-31 DIAGNOSIS — C50412 Malignant neoplasm of upper-outer quadrant of left female breast: Secondary | ICD-10-CM

## 2017-07-31 DIAGNOSIS — Z170 Estrogen receptor positive status [ER+]: Principal | ICD-10-CM

## 2017-07-31 DIAGNOSIS — Z5112 Encounter for antineoplastic immunotherapy: Secondary | ICD-10-CM | POA: Diagnosis not present

## 2017-07-31 MED ORDER — HEPARIN SOD (PORK) LOCK FLUSH 100 UNIT/ML IV SOLN
500.0000 [IU] | Freq: Once | INTRAVENOUS | Status: AC | PRN
  Administered 2017-07-31: 500 [IU]
  Filled 2017-07-31: qty 5

## 2017-07-31 MED ORDER — SODIUM CHLORIDE 0.9 % IV SOLN
Freq: Once | INTRAVENOUS | Status: AC
  Administered 2017-07-31: 09:00:00 via INTRAVENOUS

## 2017-07-31 MED ORDER — DIPHENHYDRAMINE HCL 25 MG PO CAPS
50.0000 mg | ORAL_CAPSULE | Freq: Once | ORAL | Status: AC
  Administered 2017-07-31: 50 mg via ORAL

## 2017-07-31 MED ORDER — SODIUM CHLORIDE 0.9% FLUSH
10.0000 mL | INTRAVENOUS | Status: DC | PRN
  Administered 2017-07-31: 10 mL
  Filled 2017-07-31: qty 10

## 2017-07-31 MED ORDER — ACETAMINOPHEN 325 MG PO TABS
ORAL_TABLET | ORAL | Status: AC
  Filled 2017-07-31: qty 2

## 2017-07-31 MED ORDER — ACETAMINOPHEN 325 MG PO TABS
650.0000 mg | ORAL_TABLET | Freq: Once | ORAL | Status: AC
  Administered 2017-07-31: 650 mg via ORAL

## 2017-07-31 MED ORDER — DOXYCYCLINE HYCLATE 100 MG PO TABS: 100.0000 mg | ORAL_TABLET | Freq: Two times a day (BID) | ORAL | 0 refills | Status: DC

## 2017-07-31 MED ORDER — SODIUM CHLORIDE 0.9 % IV SOLN
420.0000 mg | Freq: Once | INTRAVENOUS | Status: AC
  Administered 2017-07-31: 420 mg via INTRAVENOUS
  Filled 2017-07-31: qty 14

## 2017-07-31 MED ORDER — TRASTUZUMAB CHEMO 150 MG IV SOLR
600.0000 mg | Freq: Once | INTRAVENOUS | Status: AC
  Administered 2017-07-31: 600 mg via INTRAVENOUS
  Filled 2017-07-31: qty 28.57

## 2017-07-31 MED ORDER — DIPHENHYDRAMINE HCL 25 MG PO CAPS
ORAL_CAPSULE | ORAL | Status: AC
  Filled 2017-07-31: qty 2

## 2017-07-31 NOTE — Patient Instructions (Signed)
[facility] Discharge Instructions for Patients Receiving Chemotherapy  Today you received the following chemotherapy agents :  Herceptin, Perjeta.  To help prevent nausea and vomiting after your treatment, we encourage you to take your nausea medication as prescribed.   If you develop nausea and vomiting that is not controlled by your nausea medication, call the clinic.   BELOW ARE SYMPTOMS THAT SHOULD BE REPORTED IMMEDIATELY:  *FEVER GREATER THAN 100.5 F  *CHILLS WITH OR WITHOUT FEVER  NAUSEA AND VOMITING THAT IS NOT CONTROLLED WITH YOUR NAUSEA MEDICATION  *UNUSUAL SHORTNESS OF BREATH  *UNUSUAL BRUISING OR BLEEDING  TENDERNESS IN MOUTH AND THROAT WITH OR WITHOUT PRESENCE OF ULCERS  *URINARY PROBLEMS  *BOWEL PROBLEMS  UNUSUAL RASH Items with * indicate a potential emergency and should be followed up as soon as possible.  Feel free to call the clinic should you have any questions or concerns. The clinic phone number is [phone].  Please show the CHEMO ALERT CARD at check-in to the Emergency Department and triage nurse.   

## 2017-07-31 NOTE — Progress Notes (Signed)
Patient refuses to stay for 30 minutes post Perjeta observation.

## 2017-08-07 MED ORDER — DEXAMETHASONE SODIUM PHOSPHATE 10 MG/ML IJ SOLN
INTRAMUSCULAR | Status: AC
  Filled 2017-08-07: qty 1

## 2017-08-07 MED ORDER — PALONOSETRON HCL INJECTION 0.25 MG/5ML
INTRAVENOUS | Status: AC
  Filled 2017-08-07: qty 5

## 2017-08-07 MED ORDER — ATROPINE SULFATE 1 MG/ML IJ SOLN
INTRAMUSCULAR | Status: AC
  Filled 2017-08-07: qty 1

## 2017-08-12 ENCOUNTER — Ambulatory Visit
Admission: RE | Admit: 2017-08-12 | Discharge: 2017-08-12 | Disposition: A | Discharge status: Home / Self Care | Payer: BLUE CROSS/BLUE SHIELD | Source: Ambulatory Visit | Attending: Hematology and Oncology | Admitting: Hematology and Oncology

## 2017-08-12 ENCOUNTER — Ambulatory Visit: Admission: RE | Admit: 2017-08-12 | Payer: BLUE CROSS/BLUE SHIELD | Source: Ambulatory Visit

## 2017-08-12 DIAGNOSIS — R928 Other abnormal and inconclusive findings on diagnostic imaging of breast: Secondary | ICD-10-CM | POA: Diagnosis not present

## 2017-08-12 DIAGNOSIS — Z853 Personal history of malignant neoplasm of breast: Secondary | ICD-10-CM

## 2017-08-12 HISTORY — DX: Personal history of irradiation: Z92.3

## 2017-08-12 HISTORY — DX: Personal history of antineoplastic chemotherapy: Z92.21

## 2017-08-21 ENCOUNTER — Other Ambulatory Visit (HOSPITAL_BASED_OUTPATIENT_CLINIC_OR_DEPARTMENT_OTHER): Payer: BLUE CROSS/BLUE SHIELD

## 2017-08-21 ENCOUNTER — Ambulatory Visit (HOSPITAL_BASED_OUTPATIENT_CLINIC_OR_DEPARTMENT_OTHER): Payer: BLUE CROSS/BLUE SHIELD

## 2017-08-21 ENCOUNTER — Ambulatory Visit (HOSPITAL_BASED_OUTPATIENT_CLINIC_OR_DEPARTMENT_OTHER): Payer: BLUE CROSS/BLUE SHIELD | Admitting: Hematology and Oncology

## 2017-08-21 ENCOUNTER — Ambulatory Visit: Payer: BLUE CROSS/BLUE SHIELD

## 2017-08-21 VITALS — BP 142/80 | HR 74

## 2017-08-21 DIAGNOSIS — Z5112 Encounter for antineoplastic immunotherapy: Secondary | ICD-10-CM | POA: Diagnosis not present

## 2017-08-21 DIAGNOSIS — C50412 Malignant neoplasm of upper-outer quadrant of left female breast: Secondary | ICD-10-CM | POA: Diagnosis not present

## 2017-08-21 DIAGNOSIS — R21 Rash and other nonspecific skin eruption: Secondary | ICD-10-CM | POA: Diagnosis not present

## 2017-08-21 DIAGNOSIS — Z170 Estrogen receptor positive status [ER+]: Secondary | ICD-10-CM

## 2017-08-21 DIAGNOSIS — Z95828 Presence of other vascular implants and grafts: Secondary | ICD-10-CM

## 2017-08-21 LAB — COMPREHENSIVE METABOLIC PANEL
ALBUMIN: 3.8 g/dL (ref 3.5–5.0)
ALK PHOS: 82 U/L (ref 40–150)
ALT: 14 U/L (ref 0–55)
ANION GAP: 11 meq/L (ref 3–11)
AST: 17 U/L (ref 5–34)
BILIRUBIN TOTAL: 0.42 mg/dL (ref 0.20–1.20)
BUN: 16.9 mg/dL (ref 7.0–26.0)
CALCIUM: 9.5 mg/dL (ref 8.4–10.4)
CO2: 22 mEq/L (ref 22–29)
CREATININE: 1.1 mg/dL (ref 0.6–1.1)
Chloride: 102 mEq/L (ref 98–109)
EGFR: >60 mL/min/{1.73_m2} (ref >60)
Glucose: 117 mg/dl (ref 70–140)
Potassium: 3.5 mEq/L (ref 3.5–5.1)
Sodium: 135 mEq/L — ABNORMAL LOW (ref 136–145)
TOTAL PROTEIN: 7.5 g/dL (ref 6.4–8.3)

## 2017-08-21 LAB — CBC WITH DIFFERENTIAL/PLATELET
BASO%: 0.5 % (ref 0.0–2.0)
Basophils Absolute: 0 10*3/uL (ref 0.0–0.1)
EOS%: 2 % (ref 0.0–7.0)
Eosinophils Absolute: 0.2 10*3/uL (ref 0.0–0.5)
HEMATOCRIT: 38.9 % (ref 34.8–46.6)
HEMOGLOBIN: 13.1 g/dL (ref 11.6–15.9)
LYMPH#: 1.3 10*3/uL (ref 0.9–3.3)
LYMPH%: 13.8 % — ABNORMAL LOW (ref 14.0–49.7)
MCH: 29.6 pg (ref 25.1–34.0)
MCHC: 33.7 g/dL (ref 31.5–36.0)
MCV: 87.8 fL (ref 79.5–101.0)
MONO#: 0.6 10*3/uL (ref 0.1–0.9)
MONO%: 6.8 % (ref 0.0–14.0)
NEUT#: 7.2 10*3/uL — ABNORMAL HIGH (ref 1.5–6.5)
NEUT%: 76.9 % — AB (ref 38.4–76.8)
PLATELETS: 260 10*3/uL (ref 145–400)
RBC: 4.43 10*6/uL (ref 3.70–5.45)
RDW: 14.2 % (ref 11.2–14.5)
WBC: 9.4 10*3/uL (ref 3.9–10.3)

## 2017-08-21 MED ORDER — ACETAMINOPHEN 325 MG PO TABS
ORAL_TABLET | ORAL | Status: AC
  Filled 2017-08-21: qty 2

## 2017-08-21 MED ORDER — SODIUM CHLORIDE 0.9 % IV SOLN
420.0000 mg | Freq: Once | INTRAVENOUS | Status: AC
  Administered 2017-08-21: 420 mg via INTRAVENOUS
  Filled 2017-08-21: qty 14

## 2017-08-21 MED ORDER — SODIUM CHLORIDE 0.9% FLUSH
10.0000 mL | INTRAVENOUS | Status: DC | PRN
  Administered 2017-08-21: 10 mL via INTRAVENOUS
  Filled 2017-08-21: qty 10

## 2017-08-21 MED ORDER — HEPARIN SOD (PORK) LOCK FLUSH 100 UNIT/ML IV SOLN
500.0000 [IU] | Freq: Once | INTRAVENOUS | Status: AC | PRN
  Administered 2017-08-21: 500 [IU]
  Filled 2017-08-21: qty 5

## 2017-08-21 MED ORDER — SODIUM CHLORIDE 0.9 % IV SOLN
600.0000 mg | Freq: Once | INTRAVENOUS | Status: AC
  Administered 2017-08-21: 600 mg via INTRAVENOUS
  Filled 2017-08-21: qty 28.57

## 2017-08-21 MED ORDER — SODIUM CHLORIDE 0.9 % IV SOLN
Freq: Once | INTRAVENOUS | Status: AC
  Administered 2017-08-21: 14:00:00 via INTRAVENOUS

## 2017-08-21 MED ORDER — DIPHENHYDRAMINE HCL 25 MG PO CAPS
50.0000 mg | ORAL_CAPSULE | Freq: Once | ORAL | Status: AC
  Administered 2017-08-21: 50 mg via ORAL

## 2017-08-21 MED ORDER — DIPHENHYDRAMINE HCL 25 MG PO CAPS
ORAL_CAPSULE | ORAL | Status: AC
  Filled 2017-08-21: qty 1

## 2017-08-21 MED ORDER — SODIUM CHLORIDE 0.9% FLUSH
10.0000 mL | INTRAVENOUS | Status: DC | PRN
  Administered 2017-08-21: 10 mL
  Filled 2017-08-21: qty 10

## 2017-08-21 MED ORDER — ACETAMINOPHEN 325 MG PO TABS
650.0000 mg | ORAL_TABLET | Freq: Once | ORAL | Status: AC
  Administered 2017-08-21: 650 mg via ORAL

## 2017-08-21 NOTE — Progress Notes (Signed)
Patient Care Team: [name], NP as PCP - General (Internal Medicine) [name], MD as Consulting Physician (General Surgery) Nicholas Lose, MD as Consulting Physician (Hematology and Oncology) Gery Pray, MD as Consulting Physician (Radiation Oncology) Delice Bison Charlestine Massed, NP as Nurse Practitioner (Hematology and Oncology) Larey Dresser, MD as Consulting Physician (Cardiology)  DIAGNOSIS:  Encounter Diagnosis  Name Primary?  . Malignant neoplasm of upper-outer quadrant of left breast in female, estrogen receptor positive ([place])     SUMMARY OF ONCOLOGIC HISTORY:   Malignant neoplasm of upper-outer quadrant of left female breast ([place])   08/21/2016 Initial Diagnosis    Screening detected left breast mass UOQ at 1:00: 1.2 x 1 x 0.5 cm, axilla negative Left breast biopsy 1:00: IDC with DCIS, grade 1, ER 90%, PR 70%, Ki-67 30%, HER-2 positive ratio 2.43, T1c N0 stage IA clinical stage      09/06/2016 Surgery    Left lumpectomy: IDC grade 2, 3.4 cm, Margins Neg, 0/1 LN neg, ER 90%, PR 70%, Ki-67 30%, HER-2 positive ratio 2.43 T2N0 (stage 2 A)      10/09/2016 -  Chemotherapy    TCH Perjeta 6 cycles (Dose reduced due to peripheral neuropathy/neutropenia, last cycle without Taxotere due  followed by Herceptin Perjeta maintenance for 1 year      12/20/2016 Genetic Testing    Genetic counseling and testing for hereditary cancer syndromes performed on 11/22/2016. Results are negative for pathogenic mutations in 46 genes analyzed by [facility]'s Common Hereditary Cancers Panel. Results are dated 12/20/2016. Genes tested: APC, ATM, AXIN2, BARD1, BMPR1A, BRCA1, BRCA2, BRIP1, CDH1, CDKN2A, CHEK2, CTNNA1, DICER1, EPCAM, GREM1, KIT, MEN1, MLH1, MSH2, MSH3, MSH6, MUTYH, NBN, NF1, NTHL1, PALB2, PDGFRA, PMS2, POLD1, POLE, PTEN, RAD50, RAD51C, RAD51D, SDHA, SDHB, SDHC, SDHD, SMAD4, SMARCA4, STK11, TP53, TSC1, TSC2, and VHL.  A variant of uncertain significance (not clinically actionable)  was noted in the KIT gene called [id number] (p.Ser867Arg). At this time, it is unknown if this variant is associated with increased cancer risk or if this is a normal finding. It should not be used to make medical management decisions.      02/14/2017 - 04/03/2017 Radiation Therapy    Adjuvant radiation ([place]): The left breast was treated to 60.4 Gy, with a primary dose of 50.4 Gy delivered in 28 fractions of 1.8 Gy and a boost dose of 10 Gy delivered in 5 fractions of 2 Gy.      04/2017 - 05/2017 Anti-estrogen oral therapy    Tamoxifen daily        CHIEF COMPLIANT: Herceptin and Perjeta maintenance,  INTERVAL HISTORY: Stacy [name] is a 56 year old with above-mentioned history of left breast cancer underwent lumpectomy followed by adjuvant radiation and chemotherapy.  She is current on Herceptin and Perjeta maintenance.  Overall she appears to be tolerating it fairly well.  She could not tolerate tamoxifen therapy.  REVIEW OF SYSTEMS:   Constitutional: Denies fevers, chills or abnormal weight loss Eyes: Denies blurriness of vision Ears, nose, mouth, throat, and face: Denies mucositis or sore throat Respiratory: Denies cough, dyspnea or wheezes Cardiovascular: Denies palpitation, chest discomfort Gastrointestinal:  Denies nausea, heartburn or change in bowel habits Skin: Patient complains of skin lesions and rashes on her body along with itching sensation Lymphatics: Denies new lymphadenopathy or easy bruising Neurological:Denies numbness, tingling or new weaknesses Behavioral/Psych: Mood is stable, no new changes  Extremities: Leg pain at the end of her long work day  All other systems were reviewed with the  patient and are negative.  I have reviewed the past medical history, past surgical history, social history and family history with the patient and they are unchanged from previous note.  ALLERGIES:  is allergic to gabapentin; naproxen; neulasta [pegfilgrastim]; erythromycin; and  tramadol hcl.  MEDICATIONS:  Current Outpatient Medications  Medication Sig Dispense Refill  . hydrochlorothiazide (HYDRODIURIL) 25 MG tablet Take 1 tablet (25 mg total) daily by mouth. MUST SCHEDULE ANNUAL EXAM 30 tablet 1  . ibuprofen (ADVIL,MOTRIN) 200 MG tablet Take 400 mg by mouth every 4 (four) hours as needed for headache, mild pain or moderate pain.      No current facility-administered medications for this visit.    Facility-Administered Medications Ordered in Other Visits  Medication Dose Route Frequency Provider Last Rate Last Dose  . sodium chloride flush (NS) 0.9 % injection 10 mL  10 mL Intracatheter PRN Nicholas Lose, MD   10 mL at 02/13/17 1236  . sodium chloride flush (NS) 0.9 % injection 10 mL  10 mL Intravenous PRN Nicholas Lose, MD   10 mL at 03/06/17 1245    PHYSICAL EXAMINATION: ECOG PERFORMANCE STATUS: 1 - Symptomatic but completely ambulatory  Vitals:   08/21/17 1154  BP: (!) 131/112  Pulse: 91  Resp: 18  Temp: 98 F (36.7 C)  SpO2: 97%   Filed Weights   08/21/17 1154  Weight: 221 lb 8 oz (100.5 kg)    GENERAL:alert, no distress and comfortable SKIN: Extensive papular skin lesions on her upper extremities accompanied by itching sensation and slight tingling  EYES: normal, Conjunctiva are pink and non-injected, sclera clear OROPHARYNX:no exudate, no erythema and lips, buccal mucosa, and tongue normal  NECK: supple, thyroid normal size, non-tender, without nodularity LYMPH:  no palpable lymphadenopathy in the cervical, axillary or inguinal LUNGS: clear to auscultation and percussion with normal breathing effort HEART: regular rate & rhythm and no murmurs and no lower extremity edema ABDOMEN:abdomen soft, non-tender and normal bowel sounds MUSCULOSKELETAL:no cyanosis of digits and no clubbing  NEURO: alert & oriented x 3 with fluent speech, no focal motor/sensory deficits EXTREMITIES: No lower extremity edema  LABORATORY DATA:  I have reviewed the  data as listed   Chemistry      Component Value Date/Time   NA 137 07/10/2017 0854   K 4.1 07/10/2017 0854   CL 94 (L) 03/11/2017 1151   CO2 25 07/10/2017 0854   BUN 17.3 07/10/2017 0854   CREATININE 1.0 07/10/2017 0854      Component Value Date/Time   CALCIUM 9.7 07/10/2017 0854   ALKPHOS 80 07/10/2017 0854   AST 15 07/10/2017 0854   ALT 11 07/10/2017 0854   BILITOT 0.31 07/10/2017 0854       Lab Results  Component Value Date   WBC 9.4 08/21/2017   HGB 13.1 08/21/2017   HCT 38.9 08/21/2017   MCV 87.8 08/21/2017   PLT 260 08/21/2017   NEUTROABS 7.2 (H) 08/21/2017    ASSESSMENT & PLAN:  Malignant neoplasm of upper-outer quadrant of left female breast ([facility]) Left lumpectomy 09/06/16: IDC grade 2, 3.4 cm, Margins Neg, 0/1 LN neg, T2N0 ER 90%, PR 70%, Ki-67 30%, HER-2 positive ratio 2.43 (stage 2 A)  Treatment Plan: 1. adjuvant chemotherapy TCHPfollowed by Herceptin-Perjetamaintenance to complete 1 year. 2. Adjuvant radiation therapycompleted 04/03/2017 3. Adjuvant antiestrogen therapy with Tamoxifen x 1 month (unable to tolerate) ----------------------------------------------------------------------------- Current Treatment: Herceptin/Perjeta maintenance Discussed starting on AI therapy.  She does not want to take any medication until she recovers  from the current treatment.  She will finish Herceptin and progenitor maintenance in January 2019.  She wants to wait a couple of months after that and consider starting on aromatase inhibitor therapy.  Skin rash: On certain as to the etiology behind this.  She does not take any new medications.  We will wait to see if it gets better after Herceptin Perjeta is complete  Return to clinic every 3 weeks for Herceptin Perjeta and every 6 weeks with labs and follow-up with me  I spent 25 minutes talking to the patient of which more than half was spent in counseling and coordination of care.  No orders of the defined types were  placed in this encounter.  The patient has a good understanding of the overall plan. she agrees with it. she will call with any problems that may develop before the next visit here.   Rulon Eisenmenger, MD 08/21/17

## 2017-08-21 NOTE — Patient Instructions (Signed)
[facility] Discharge Instructions for Patients Receiving Chemotherapy  Today you received the following chemotherapy agents :  Herceptin, Perjeta.  To help prevent nausea and vomiting after your treatment, we encourage you to take your nausea medication as prescribed.   If you develop nausea and vomiting that is not controlled by your nausea medication, call the clinic.   BELOW ARE SYMPTOMS THAT SHOULD BE REPORTED IMMEDIATELY:  *FEVER GREATER THAN 100.5 F  *CHILLS WITH OR WITHOUT FEVER  NAUSEA AND VOMITING THAT IS NOT CONTROLLED WITH YOUR NAUSEA MEDICATION  *UNUSUAL SHORTNESS OF BREATH  *UNUSUAL BRUISING OR BLEEDING  TENDERNESS IN MOUTH AND THROAT WITH OR WITHOUT PRESENCE OF ULCERS  *URINARY PROBLEMS  *BOWEL PROBLEMS  UNUSUAL RASH Items with * indicate a potential emergency and should be followed up as soon as possible.  Feel free to call the clinic should you have any questions or concerns. The clinic phone number is [phone].  Please show the CHEMO ALERT CARD at check-in to the Emergency Department and triage nurse.   

## 2017-08-21 NOTE — Assessment & Plan Note (Signed)
Left lumpectomy 09/06/16: IDC grade 2, 3.4 cm, Margins Neg, 0/1 LN neg, T2N0 ER 90%, PR 70%, Ki-67 30%, HER-2 positive ratio 2.43 (stage 2 A)  Treatment Plan: 1. adjuvant chemotherapy TCHPfollowed by Herceptin-Perjetamaintenance to complete 1 year. 2. Adjuvant radiation therapycompleted 04/03/2017 3. Adjuvant antiestrogen therapy with Tamoxifen x 1 month (unable to tolerate) ----------------------------------------------------------------------------- Current Treatment: Herceptin/Perjeta maintenance Discussed starting on AI therapy.  Return to clinic every 3 weeks for Herceptin Perjeta and every 6 weeks with labs and follow-up with me

## 2017-09-02 ENCOUNTER — Telehealth: Payer: Self-pay

## 2017-09-02 NOTE — Telephone Encounter (Signed)
Pt called that her feet are hurting like a drill in there. Her hands are a deep ache hurt not as bad as feet. No position change makes a difference. 7-8/10. They have been this bad over last 2 days. Ibuprofen, soaking in warm salt water, elevation, all not helping. Not radiating. She cannot take gabapentin.   S/w Dr Lindi Adie and he will write percocet rx that pt can pick up in AM. Dr Lindi Adie stated he would get rx ready.

## 2017-09-03 ENCOUNTER — Other Ambulatory Visit: Payer: Self-pay | Admitting: Hematology and Oncology

## 2017-09-03 MED ORDER — OXYCODONE-ACETAMINOPHEN 5-325 MG PO TABS: 1.0000 | ORAL_TABLET | Freq: Three times a day (TID) | ORAL | 0 refills | Status: DC | PRN

## 2017-09-03 MED FILL — OXYCOD/ACETAMINOPHEN 5-325M: 5-325 | 20 days supply | Qty: 60 | Fill #0

## 2017-09-04 ENCOUNTER — Ambulatory Visit: Payer: BLUE CROSS/BLUE SHIELD | Admitting: Internal Medicine

## 2017-09-04 ENCOUNTER — Encounter: Payer: Self-pay | Admitting: Internal Medicine

## 2017-09-04 ENCOUNTER — Other Ambulatory Visit: Payer: Self-pay | Admitting: Internal Medicine

## 2017-09-04 VITALS — BP 128/84 | HR 78 | Temp 97.9°F | Ht 63.5 in | Wt 223.0 lb

## 2017-09-04 DIAGNOSIS — Z0001 Encounter for general adult medical examination with abnormal findings: Secondary | ICD-10-CM | POA: Diagnosis not present

## 2017-09-04 DIAGNOSIS — M1990 Unspecified osteoarthritis, unspecified site: Secondary | ICD-10-CM | POA: Diagnosis not present

## 2017-09-04 DIAGNOSIS — F3341 Major depressive disorder, recurrent, in partial remission: Secondary | ICD-10-CM

## 2017-09-04 DIAGNOSIS — B9789 Other viral agents as the cause of diseases classified elsewhere: Secondary | ICD-10-CM

## 2017-09-04 DIAGNOSIS — C50412 Malignant neoplasm of upper-outer quadrant of left female breast: Secondary | ICD-10-CM | POA: Diagnosis not present

## 2017-09-04 DIAGNOSIS — Z170 Estrogen receptor positive status [ER+]: Secondary | ICD-10-CM

## 2017-09-04 DIAGNOSIS — M797 Fibromyalgia: Secondary | ICD-10-CM | POA: Diagnosis not present

## 2017-09-04 DIAGNOSIS — J069 Acute upper respiratory infection, unspecified: Secondary | ICD-10-CM

## 2017-09-04 DIAGNOSIS — I10 Essential (primary) hypertension: Secondary | ICD-10-CM

## 2017-09-04 MED ORDER — AZITHROMYCIN 250 MG PO TABS: ORAL_TABLET | ORAL | 0 refills | Status: DC

## 2017-09-04 MED ORDER — HYDROCHLOROTHIAZIDE 25 MG PO TABS: 25.0000 mg | ORAL_TABLET | Freq: Every day | ORAL | 3 refills | Status: DC

## 2017-09-04 NOTE — Progress Notes (Signed)
Subjective:    Patient ID: Stacy [name], female    DOB: 05-17-1961, 56 y.o.   MRN: [id number]  HPI  Pt presents to the clinic today for her annual exam. She is also due to follow up chronic conditions.  Depression: Chronic, worse lately as she has been going through her breast cancer treatments. She feels like she has a lot of support from family and friends. She is not interested in medication management at this time. She denies SI/HI.  Arthritis: In all of her major joints. She takes Advil as needed with good relief.   Fibromyalgia: She reports chronic muscle and joint pain. She tries to stay active. She will take Ibuprofen as needed with good relief. She is not taking any daily medications for fibromyalgia.  Breast Cancer: She will finish chemo in January. She has some chemo induced peripheral neuropathy which she takes Oxycodone as needed for severe pain. She failed Gabapentin. She will continue to follow with Dr.Gudena.  HTN: Her BP today is. She is taking HCTZ as prescribed. ECG from 01/2017 reviewed. She would like a refill of her HCTZ today.  She also reports runny nose, ear fullness, sore throat and cough. This started 4-5 days ago. She is blowing yellow mucous out of her nose. She denies ear pain. She denies difficulty swallowing. The cough is productive of yellow mucous. She denies fever, chills or body aches. She has tried Mucinex with minimal relief. She has not had sick contacts that she is aware of.  Flu: 05/2015 Tetanus: 10/2010 Pap Smear: 04/2016 Mammogram: 07/2017 Colon Screening: never Vision Screening: annually Dentist: prn  Diet: She does eat meat. She consumes fruits and veggies. She does eat fried foods. She drinks some coffee, water. Exercise: None  Review of Systems      Past Medical History:  Diagnosis Date  . Anxiety   . Arthritis   . Complication of anesthesia    DIFFICULTY WAKING UP , LAST SURGERY NECK 2012 WAS FINE PER PT HERE AT [facility]  . Depression    . Fibromyalgia   . Genetic testing 12/21/2016   Genetic testing reported out on 12/20/2016 through [facility]'s 46-gene Common Hereditary Cancers panel found no deleterious mutations. The following 46 genes were analyzed: APC, ATM, AXIN2, BARD1, BMPR1A, BRCA1, BRCA2, BRIP1, CDH1, CDKN2A, CHEK2, CTNNA1, DICER1, EPCAM, GREM1, KIT, MEN1, MLH1, MSH2, MSH3, MSH6, MUTYH, NBN, NF1, NTHL1, PALB2, PDGFRA, PMS2, POLD1, POLE, PTEN, RAD50, RAD51C, RAD51D, SDH  . Headache    HX MIGRAINES    . Heart murmur   . History of radiation therapy 02/14/17-04/03/2017   left breast 60.4 Gy: 50.4 Gy delivered in 28 fractions and a boost of 10 Gy delivered in 5 fractions.  . Hypertension   . Malignant neoplasm of upper-outer quadrant of left female breast ([place]) 08/23/2016  . MVP (mitral valve prolapse)    AS INFANT  . Neck pain   . Personal history of chemotherapy   . Personal history of radiation therapy   . Vision abnormalities     Current Outpatient Medications  Medication Sig Dispense Refill  . hydrochlorothiazide (HYDRODIURIL) 25 MG tablet Take 1 tablet (25 mg total) daily by mouth. MUST SCHEDULE ANNUAL EXAM 30 tablet 1  . ibuprofen (ADVIL,MOTRIN) 200 MG tablet Take 400 mg by mouth every 4 (four) hours as needed for headache, mild pain or moderate pain.     Marland Kitchen oxyCODONE-acetaminophen (PERCOCET/ROXICET) 5-325 MG tablet Take 1 tablet by mouth every 8 (eight) hours as needed for severe  pain. 60 tablet 0   No current facility-administered medications for this visit.    Facility-Administered Medications Ordered in Other Visits  Medication Dose Route Frequency Provider Last Rate Last Dose  . sodium chloride flush (NS) 0.9 % injection 10 mL  10 mL Intracatheter PRN Nicholas Lose, MD   10 mL at 02/13/17 1236  . sodium chloride flush (NS) 0.9 % injection 10 mL  10 mL Intravenous PRN Nicholas Lose, MD   10 mL at 03/06/17 1245    Allergies  Allergen Reactions  . Gabapentin Swelling    "makes me feel detached"  .  Naproxen Palpitations  . Neulasta [Pegfilgrastim] Itching and Other (See Comments)    Patient stated,"reddish skin tone, body on fire, tingly tongue."  . Erythromycin Nausea And Vomiting  . Tramadol Hcl Other (See Comments)    Headaches    Family History  Adopted: Yes  Problem Relation Age of Onset  . Breast cancer Brother 36       Maternal half-brother. Currently 56.  Marland Kitchen BRCA 1/2 Neg Hx     Social History   Socioeconomic History  . Marital status: Married    Spouse name: Not on file  . Number of children: 4  . Years of education: Not on file  . Highest education level: Not on file  Social Needs  . Financial resource strain: Not on file  . Food insecurity - worry: Not on file  . Food insecurity - inability: Not on file  . Transportation needs - medical: Not on file  . Transportation needs - non-medical: Not on file  Occupational History  . Not on file  Tobacco Use  . Smoking status: Former Smoker    Packs/day: 0.25    Types: Cigarettes    Last attempt to quit: 01/13/2017    Years since quitting: 0.6  . Smokeless tobacco: Never Used  Substance and Sexual Activity  . Alcohol use: No    Alcohol/week: 0.0 oz  . Drug use: No  . Sexual activity: Yes    Birth control/protection: Post-menopausal  Other Topics Concern  . Not on file  Social History Narrative  . Not on file     Constitutional: Denies fever, malaise, fatigue, headache or abrupt weight changes.  HEENT: Pt reports runny nose, ear fullness, and sore throat. Denies eye pain, eye redness, ear pain, ringing in the ears, wax buildup, nasal congestion, bloody nose. Respiratory: Pt reports cough. Denies difficulty breathing, shortness of breath.   Cardiovascular: Denies chest pain, chest tightness, palpitations or swelling in the hands or feet.  Gastrointestinal: Denies abdominal pain, bloating, constipation, diarrhea or blood in the stool.  GU: Denies urgency, frequency, pain with urination, burning sensation,  blood in urine, odor or discharge. Musculoskeletal: Pt reports chronic muscle and joint pain. Denies decrease in range of motion, difficulty with gait, or joint swelling.  Skin: Denies redness, rashes, lesions or ulcercations.  Neurological: Denies dizziness, difficulty with memory, difficulty with speech or problems with balance and coordination.  Psych: Pt has a history of anxiety and depression. Denies SI/HI.  No other specific complaints in a complete review of systems (except as listed in HPI above).  Objective:   Physical Exam   BP 128/84   Pulse 78   Temp 97.9 F (36.6 C) (Oral)   Ht 5' 3.5" (1.613 m)   Wt 223 lb (101.2 kg)   LMP 11/07/2016   SpO2 98%   BMI 38.88 kg/m  Wt Readings from Last 3 Encounters:  09/04/17  223 lb (101.2 kg)  08/21/17 221 lb 8 oz (100.5 kg)  07/26/17 222 lb 12.8 oz (101.1 kg)    General: Appears her stated age, obese in NAD. Skin: Warm, dry and intact.  HEENT: Head: normal shape and size; Eyes: sclera white, no icterus, conjunctiva pink, PERRLA and EOMs intact; Ears: Tm's gray and intact, normal light reflex; Nose: mucosa boggy and moist, turbinates swollen; Throat/Mouth: Teeth present, mucosa pink and moist, + PND, no exudate, lesions or ulcerations noted.  Neck:  Neck supple, trachea midline. No masses, lumps or thyromegaly present.  Cardiovascular: Normal rate and rhythm. S1,S2 noted.  No murmur, rubs or gallops noted. No JVD or BLE edema. No carotid bruits noted. Pulmonary/Chest: Normal effort and positive vesicular breath sounds. No respiratory distress. No wheezes, rales or ronchi noted.  Abdomen: Soft and nontender. Normal bowel sounds. No distention or masses noted. Liver, spleen and kidneys non palpable. Musculoskeletal: Strength 5/5 BUE/BLE.Marland Kitchen No difficulty with gait.  Neurological: Alert and oriented. Cranial nerves II-XII grossly intact. Sensation mildly decreased in bilateral feet. Coordination normal.  Psychiatric: Mood and affect  normal. Behavior is normal. Judgment and thought content normal.    BMET    Component Value Date/Time   NA 135 (L) 08/21/2017 1058   K 3.5 08/21/2017 1058   CL 94 (L) 03/11/2017 1151   CO2 22 08/21/2017 1058   GLUCOSE 117 08/21/2017 1058   BUN 16.9 08/21/2017 1058   CREATININE 1.1 08/21/2017 1058   CALCIUM 9.5 08/21/2017 1058   GFRNONAA 44 (L) 03/11/2017 1151   GFRAA 51 (L) 03/11/2017 1151    Lipid Panel     Component Value Date/Time   CHOL 172 04/20/2016 1417   TRIG 127.0 04/20/2016 1417   HDL 59.10 04/20/2016 1417   CHOLHDL 3 04/20/2016 1417   VLDL 25.4 04/20/2016 1417   LDLCALC 88 04/20/2016 1417    CBC    Component Value Date/Time   WBC 9.4 08/21/2017 1058   WBC 11.3 (H) 03/11/2017 1151   RBC 4.43 08/21/2017 1058   RBC 3.44 (L) 03/11/2017 1151   HGB 13.1 08/21/2017 1058   HCT 38.9 08/21/2017 1058   PLT 260 08/21/2017 1058   MCV 87.8 08/21/2017 1058   MCH 29.6 08/21/2017 1058   MCH 32.3 03/11/2017 1151   MCHC 33.7 08/21/2017 1058   MCHC 35.0 03/11/2017 1151   RDW 14.2 08/21/2017 1058   LYMPHSABS 1.3 08/21/2017 1058   MONOABS 0.6 08/21/2017 1058   EOSABS 0.2 08/21/2017 1058   BASOSABS 0.0 08/21/2017 1058    Hgb A1C Lab Results  Component Value Date   HGBA1C 5.6 04/20/2016           Assessment & Plan:   Preventative Health Maintenance:  She declines flu shot today Tetanus UTD Pap smear and mammogram UTD She declines colon cancer screening at this time Encouraged her to consume a balanced diet and exercise regimen Advised him to see an eye doctor and dentist annually She declines labs today  Viral URI with Cough:  Advised her to continue Mucinex She may want to try Flonase as well Printed RX for Azithromax to start on Saturday if symptoms persist or worsen  RTC in 1 year, sooner if needed Webb Silversmith, NP

## 2017-09-05 ENCOUNTER — Encounter: Payer: Self-pay | Admitting: Hematology and Oncology

## 2017-09-05 NOTE — Progress Notes (Signed)
Received staff message regarding PA for Oxycodone.  Called [employer] [place](Destiny T) whom took the information and faxed the form to be completed by physician.  Gave form to [employer] to have completed and obtain signature.

## 2017-09-06 ENCOUNTER — Encounter: Payer: Self-pay | Admitting: Internal Medicine

## 2017-09-06 NOTE — Assessment & Plan Note (Signed)
Chronic but stable off meds Support offered today Will monitor 

## 2017-09-06 NOTE — Assessment & Plan Note (Signed)
Encouraged her to stay active Discussed how weight loss may help improve joint pain Continue Advil as needed

## 2017-09-06 NOTE — Assessment & Plan Note (Signed)
Controlled on HCTZ, refilled today Recent kidney function reviewed, she declines repeat labs today

## 2017-09-06 NOTE — Assessment & Plan Note (Signed)
She is still doing chemotherapy She will continue to follow with Dr. Lindi Adie

## 2017-09-06 NOTE — Assessment & Plan Note (Signed)
Encouraged her to stay active Continue Ibuprofen as needed Will monitor for now

## 2017-09-06 NOTE — Patient Instructions (Signed)
Health Maintenance for Postmenopausal Women Menopause is a normal process in which your reproductive ability comes to an end. This process happens gradually over a span of months to years, usually between the ages of 22 and 9. Menopause is complete when you have missed 12 consecutive menstrual periods. It is important to talk with your health care provider about some of the most common conditions that affect postmenopausal women, such as heart disease, cancer, and bone loss (osteoporosis). Adopting a healthy lifestyle and getting preventive care can help to promote your health and wellness. Those actions can also lower your chances of developing some of these common conditions. What should I know about menopause? During menopause, you may experience a number of symptoms, such as:  Moderate-to-severe hot flashes.  Night sweats.  Decrease in sex drive.  Mood swings.  Headaches.  Tiredness.  Irritability.  Memory problems.  Insomnia.  Choosing to treat or not to treat menopausal changes is an individual decision that you make with your health care provider. What should I know about hormone replacement therapy and supplements? Hormone therapy products are effective for treating symptoms that are associated with menopause, such as hot flashes and night sweats. Hormone replacement carries certain risks, especially as you become older. If you are thinking about using estrogen or estrogen with progestin treatments, discuss the benefits and risks with your health care provider. What should I know about heart disease and stroke? Heart disease, heart attack, and stroke become more likely as you age. This may be due, in part, to the hormonal changes that your body experiences during menopause. These can affect how your body processes dietary fats, triglycerides, and cholesterol. Heart attack and stroke are both medical emergencies. There are many things that you can do to help prevent heart disease  and stroke:  Have your blood pressure checked at least every 1-2 years. High blood pressure causes heart disease and increases the risk of stroke.  If you are 53-22 years old, ask your health care provider if you should take aspirin to prevent a heart attack or a stroke.  Do not use any tobacco products, including cigarettes, chewing tobacco, or electronic cigarettes. If you need help quitting, ask your health care provider.  It is important to eat a healthy diet and maintain a healthy weight. ? Be sure to include plenty of vegetables, fruits, low-fat dairy products, and lean protein. ? Avoid eating foods that are high in solid fats, added sugars, or salt (sodium).  Get regular exercise. This is one of the most important things that you can do for your health. ? Try to exercise for at least 150 minutes each week. The type of exercise that you do should increase your heart rate and make you sweat. This is known as moderate-intensity exercise. ? Try to do strengthening exercises at least twice each week. Do these in addition to the moderate-intensity exercise.  Know your numbers.Ask your health care provider to check your cholesterol and your blood glucose. Continue to have your blood tested as directed by your health care provider.  What should I know about cancer screening? There are several types of cancer. Take the following steps to reduce your risk and to catch any cancer development as early as possible. Breast Cancer  Practice breast self-awareness. ? This means understanding how your breasts normally appear and feel. ? It also means doing regular breast self-exams. Let your health care provider know about any changes, no matter how small.  If you are 40  or older, have a clinician do a breast exam (clinical breast exam or CBE) every year. Depending on your age, family history, and medical history, it may be recommended that you also have a yearly breast X-ray (mammogram).  If you  have a family history of breast cancer, talk with your health care provider about genetic screening.  If you are at high risk for breast cancer, talk with your health care provider about having an MRI and a mammogram every year.  Breast cancer (BRCA) gene test is recommended for women who have family members with BRCA-related cancers. Results of the assessment will determine the need for genetic counseling and BRCA1 and for BRCA2 testing. BRCA-related cancers include these types: ? Breast. This occurs in males or females. ? Ovarian. ? Tubal. This may also be called fallopian tube cancer. ? Cancer of the abdominal or pelvic lining (peritoneal cancer). ? Prostate. ? Pancreatic.  Cervical, Uterine, and Ovarian Cancer Your health care provider may recommend that you be screened regularly for cancer of the pelvic organs. These include your ovaries, uterus, and vagina. This screening involves a pelvic exam, which includes checking for microscopic changes to the surface of your cervix (Pap test).  For women ages 21-65, health care providers may recommend a pelvic exam and a Pap test every three years. For women ages 79-65, they may recommend the Pap test and pelvic exam, combined with testing for human papilloma virus (HPV), every five years. Some types of HPV increase your risk of cervical cancer. Testing for HPV may also be done on women of any age who have unclear Pap test results.  Other health care providers may not recommend any screening for nonpregnant women who are considered low risk for pelvic cancer and have no symptoms. Ask your health care provider if a screening pelvic exam is right for you.  If you have had past treatment for cervical cancer or a condition that could lead to cancer, you need Pap tests and screening for cancer for at least 20 years after your treatment. If Pap tests have been discontinued for you, your risk factors (such as having a new sexual partner) need to be  reassessed to determine if you should start having screenings again. Some women have medical problems that increase the chance of getting cervical cancer. In these cases, your health care provider may recommend that you have screening and Pap tests more often.  If you have a family history of uterine cancer or ovarian cancer, talk with your health care provider about genetic screening.  If you have vaginal bleeding after reaching menopause, tell your health care provider.  There are currently no reliable tests available to screen for ovarian cancer.  Lung Cancer Lung cancer screening is recommended for adults 69-62 years old who are at high risk for lung cancer because of a history of smoking. A yearly low-dose CT scan of the lungs is recommended if you:  Currently smoke.  Have a history of at least 30 pack-years of smoking and you currently smoke or have quit within the past 15 years. A pack-year is smoking an average of one pack of cigarettes per day for one year.  Yearly screening should:  Continue until it has been 15 years since you quit.  Stop if you develop a health problem that would prevent you from having lung cancer treatment.  Colorectal Cancer  This type of cancer can be detected and can often be prevented.  Routine colorectal cancer screening usually begins at  age 42 and continues through age 45.  If you have risk factors for colon cancer, your health care provider may recommend that you be screened at an earlier age.  If you have a family history of colorectal cancer, talk with your health care provider about genetic screening.  Your health care provider may also recommend using home test kits to check for hidden blood in your stool.  A small camera at the end of a tube can be used to examine your colon directly (sigmoidoscopy or colonoscopy). This is done to check for the earliest forms of colorectal cancer.  Direct examination of the colon should be repeated every  5-10 years until age 71. However, if early forms of precancerous polyps or small growths are found or if you have a family history or genetic risk for colorectal cancer, you may need to be screened more often.  Skin Cancer  Check your skin from head to toe regularly.  Monitor any moles. Be sure to tell your health care provider: ? About any new moles or changes in moles, especially if there is a change in a mole's shape or color. ? If you have a mole that is larger than the size of a pencil eraser.  If any of your family members has a history of skin cancer, especially at a young age, talk with your health care provider about genetic screening.  Always use sunscreen. Apply sunscreen liberally and repeatedly throughout the day.  Whenever you are outside, protect yourself by wearing long sleeves, pants, a wide-brimmed hat, and sunglasses.  What should I know about osteoporosis? Osteoporosis is a condition in which bone destruction happens more quickly than new bone creation. After menopause, you may be at an increased risk for osteoporosis. To help prevent osteoporosis or the bone fractures that can happen because of osteoporosis, the following is recommended:  If you are 46-71 years old, get at least 1,000 mg of calcium and at least 600 mg of vitamin D per day.  If you are older than age 55 but younger than age 65, get at least 1,200 mg of calcium and at least 600 mg of vitamin D per day.  If you are older than age 54, get at least 1,200 mg of calcium and at least 800 mg of vitamin D per day.  Smoking and excessive alcohol intake increase the risk of osteoporosis. Eat foods that are rich in calcium and vitamin D, and do weight-bearing exercises several times each week as directed by your health care provider. What should I know about how menopause affects my mental health? Depression may occur at any age, but it is more common as you become older. Common symptoms of depression  include:  Low or sad mood.  Changes in sleep patterns.  Changes in appetite or eating patterns.  Feeling an overall lack of motivation or enjoyment of activities that you previously enjoyed.  Frequent crying spells.  Talk with your health care provider if you think that you are experiencing depression. What should I know about immunizations? It is important that you get and maintain your immunizations. These include:  Tetanus, diphtheria, and pertussis (Tdap) booster vaccine.  Influenza every year before the flu season begins.  Pneumonia vaccine.  Shingles vaccine.  Your health care provider may also recommend other immunizations. This information is not intended to replace advice given to you by your health care provider. Make sure you discuss any questions you have with your health care provider. Document Released: 10/26/2005  Document Revised: 03/23/2016 Document Reviewed: 06/07/2015 Elsevier Interactive Patient Education  2018 [employer]  

## 2017-09-11 ENCOUNTER — Ambulatory Visit (HOSPITAL_BASED_OUTPATIENT_CLINIC_OR_DEPARTMENT_OTHER): Payer: BLUE CROSS/BLUE SHIELD

## 2017-09-11 ENCOUNTER — Other Ambulatory Visit (HOSPITAL_BASED_OUTPATIENT_CLINIC_OR_DEPARTMENT_OTHER): Payer: BLUE CROSS/BLUE SHIELD

## 2017-09-11 ENCOUNTER — Other Ambulatory Visit: Payer: BLUE CROSS/BLUE SHIELD

## 2017-09-11 ENCOUNTER — Ambulatory Visit: Payer: BLUE CROSS/BLUE SHIELD

## 2017-09-11 VITALS — BP 147/90 | HR 73 | Temp 98.2°F | Resp 18

## 2017-09-11 DIAGNOSIS — Z170 Estrogen receptor positive status [ER+]: Principal | ICD-10-CM

## 2017-09-11 DIAGNOSIS — C50412 Malignant neoplasm of upper-outer quadrant of left female breast: Secondary | ICD-10-CM | POA: Diagnosis not present

## 2017-09-11 DIAGNOSIS — Z5112 Encounter for antineoplastic immunotherapy: Secondary | ICD-10-CM

## 2017-09-11 LAB — CBC WITH DIFFERENTIAL/PLATELET
BASO%: 0.2 % (ref 0.0–2.0)
BASOS ABS: 0 10*3/uL (ref 0.0–0.1)
EOS ABS: 0.2 10*3/uL (ref 0.0–0.5)
EOS%: 2.7 % (ref 0.0–7.0)
HEMATOCRIT: 38.6 % (ref 34.8–46.6)
HEMOGLOBIN: 13 g/dL (ref 11.6–15.9)
LYMPH#: 1.4 10*3/uL (ref 0.9–3.3)
LYMPH%: 15.9 % (ref 14.0–49.7)
MCH: 29.9 pg (ref 25.1–34.0)
MCHC: 33.7 g/dL (ref 31.5–36.0)
MCV: 88.7 fL (ref 79.5–101.0)
MONO#: 0.8 10*3/uL (ref 0.1–0.9)
MONO%: 8.4 % (ref 0.0–14.0)
NEUT#: 6.6 10*3/uL — ABNORMAL HIGH (ref 1.5–6.5)
NEUT%: 72.8 % (ref 38.4–76.8)
PLATELETS: 263 10*3/uL (ref 145–400)
RBC: 4.35 10*6/uL (ref 3.70–5.45)
RDW: 13.4 % (ref 11.2–14.5)
WBC: 9 10*3/uL (ref 3.9–10.3)

## 2017-09-11 LAB — COMPREHENSIVE METABOLIC PANEL
ALBUMIN: 3.7 g/dL (ref 3.5–5.0)
ALK PHOS: 93 U/L (ref 40–150)
ALT: 13 U/L (ref 0–55)
ANION GAP: 11 meq/L (ref 3–11)
AST: 15 U/L (ref 5–34)
BILIRUBIN TOTAL: 0.42 mg/dL (ref 0.20–1.20)
BUN: 18.2 mg/dL (ref 7.0–26.0)
CALCIUM: 9.4 mg/dL (ref 8.4–10.4)
CO2: 25 mEq/L (ref 22–29)
Chloride: 101 mEq/L (ref 98–109)
Creatinine: 1 mg/dL (ref 0.6–1.1)
Glucose: 98 mg/dl (ref 70–140)
POTASSIUM: 3.5 meq/L (ref 3.5–5.1)
Sodium: 138 mEq/L (ref 136–145)
TOTAL PROTEIN: 7.4 g/dL (ref 6.4–8.3)

## 2017-09-11 MED ORDER — SODIUM CHLORIDE 0.9 % IV SOLN
Freq: Once | INTRAVENOUS | Status: AC
  Administered 2017-09-11: 10:00:00 via INTRAVENOUS

## 2017-09-11 MED ORDER — ACETAMINOPHEN 325 MG PO TABS
650.0000 mg | ORAL_TABLET | Freq: Once | ORAL | Status: AC
  Administered 2017-09-11: 650 mg via ORAL

## 2017-09-11 MED ORDER — ACETAMINOPHEN 325 MG PO TABS
ORAL_TABLET | ORAL | Status: AC
  Filled 2017-09-11: qty 2

## 2017-09-11 MED ORDER — PERTUZUMAB CHEMO INJECTION 420 MG/14ML
420.0000 mg | Freq: Once | INTRAVENOUS | Status: AC
  Administered 2017-09-11: 420 mg via INTRAVENOUS
  Filled 2017-09-11: qty 14

## 2017-09-11 MED ORDER — HEPARIN SOD (PORK) LOCK FLUSH 100 UNIT/ML IV SOLN
500.0000 [IU] | Freq: Once | INTRAVENOUS | Status: AC | PRN
  Administered 2017-09-11: 500 [IU]
  Filled 2017-09-11: qty 5

## 2017-09-11 MED ORDER — DIPHENHYDRAMINE HCL 25 MG PO CAPS
ORAL_CAPSULE | ORAL | Status: AC
  Filled 2017-09-11: qty 2

## 2017-09-11 MED ORDER — DIPHENHYDRAMINE HCL 25 MG PO CAPS
50.0000 mg | ORAL_CAPSULE | Freq: Once | ORAL | Status: AC
  Administered 2017-09-11: 50 mg via ORAL

## 2017-09-11 MED ORDER — SODIUM CHLORIDE 0.9% FLUSH
10.0000 mL | INTRAVENOUS | Status: DC | PRN
  Administered 2017-09-11: 10 mL
  Filled 2017-09-11: qty 10

## 2017-09-11 MED ORDER — TRASTUZUMAB CHEMO 150 MG IV SOLR
600.0000 mg | Freq: Once | INTRAVENOUS | Status: AC
  Administered 2017-09-11: 600 mg via INTRAVENOUS
  Filled 2017-09-11: qty 28.57

## 2017-09-11 NOTE — Patient Instructions (Signed)
[facility] Discharge Instructions for Patients Receiving Chemotherapy  Today you received the following chemotherapy agents :  Herceptin, Perjeta.  To help prevent nausea and vomiting after your treatment, we encourage you to take your nausea medication as prescribed.   If you develop nausea and vomiting that is not controlled by your nausea medication, call the clinic.   BELOW ARE SYMPTOMS THAT SHOULD BE REPORTED IMMEDIATELY:  *FEVER GREATER THAN 100.5 F  *CHILLS WITH OR WITHOUT FEVER  NAUSEA AND VOMITING THAT IS NOT CONTROLLED WITH YOUR NAUSEA MEDICATION  *UNUSUAL SHORTNESS OF BREATH  *UNUSUAL BRUISING OR BLEEDING  TENDERNESS IN MOUTH AND THROAT WITH OR WITHOUT PRESENCE OF ULCERS  *URINARY PROBLEMS  *BOWEL PROBLEMS  UNUSUAL RASH Items with * indicate a potential emergency and should be followed up as soon as possible.  Feel free to call the clinic should you have any questions or concerns. The clinic phone number is [phone].  Please show the CHEMO ALERT CARD at check-in to the Emergency Department and triage nurse.   

## 2017-09-11 NOTE — Progress Notes (Signed)
Patient declines the recommended 30 minute observation time post-perjeta.

## 2017-09-13 ENCOUNTER — Encounter: Payer: Self-pay | Admitting: Internal Medicine

## 2017-09-16 ENCOUNTER — Encounter: Payer: Self-pay | Admitting: Internal Medicine

## 2017-09-18 ENCOUNTER — Telehealth: Payer: Self-pay

## 2017-09-18 NOTE — Telephone Encounter (Signed)
Returned pt call regarding FMLA paperwork. Explained to pt that a different departments handles that paperwork and it typically takes 10-14 business days and she should be hearing from that dept when paperwork is completed.  Cyndia Bent RN

## 2017-09-19 ENCOUNTER — Ambulatory Visit: Payer: BLUE CROSS/BLUE SHIELD | Admitting: Adult Health

## 2017-09-19 ENCOUNTER — Encounter: Payer: Self-pay | Admitting: Adult Health

## 2017-09-19 ENCOUNTER — Encounter: Payer: Self-pay | Admitting: Family Medicine

## 2017-09-19 VITALS — BP 122/80 | HR 90 | Temp 97.9°F | Wt 221.0 lb

## 2017-09-19 DIAGNOSIS — J069 Acute upper respiratory infection, unspecified: Secondary | ICD-10-CM | POA: Diagnosis not present

## 2017-09-19 MED ORDER — DOXYCYCLINE HYCLATE 100 MG PO CAPS: 100.0000 mg | ORAL_CAPSULE | Freq: Two times a day (BID) | ORAL | 0 refills | Status: DC

## 2017-09-19 MED ORDER — FLUTICASONE PROPIONATE 50 MCG/ACT NA SUSP: 2.0000 | Freq: Every day | NASAL | 6 refills | Status: DC

## 2017-09-19 MED ORDER — HYDROCODONE-HOMATROPINE 5-1.5 MG/5ML PO SYRP: 5.0000 mL | ORAL_SOLUTION | Freq: Three times a day (TID) | ORAL | 0 refills | Status: DC | PRN

## 2017-09-19 NOTE — Progress Notes (Signed)
Subjective:    Patient ID: Stacy [name], female    DOB: [birth year], 57 y.o.   MRN: [id number]  Had cancer treatment on  December 26th.  She was given a prescription for Z pak by her PCP, which she took and did not notice a difference in her symptoms    URI   This is a new problem. The current episode started 1 to 4 weeks ago (two weeks ). The problem has been gradually worsening. There has been no fever. Associated symptoms include congestion, coughing, ear pain, a plugged ear sensation and rhinorrhea. Pertinent negatives include no sore throat or wheezing. Treatments tried: various OTC medications As well as a Zpack  The treatment provided no relief.    Review of Systems  Constitutional: Positive for activity change and fatigue. Negative for fever.  HENT: Positive for congestion, ear pain, postnasal drip, rhinorrhea and sinus pressure. Negative for sore throat and trouble swallowing.   Respiratory: Positive for cough. Negative for shortness of breath and wheezing.   Cardiovascular: Negative.   Gastrointestinal: Negative.    Past Medical History:  Diagnosis Date  . Anxiety   . Arthritis   . Complication of anesthesia    DIFFICULTY WAKING UP , LAST SURGERY NECK 2012 WAS FINE PER PT HERE AT [facility]  . Depression   . Fibromyalgia   . Genetic testing 12/21/2016   Genetic testing reported out on 12/20/2016 through [facility]'s 46-gene Common Hereditary Cancers panel found no deleterious mutations. The following 46 genes were analyzed: APC, ATM, AXIN2, BARD1, BMPR1A, BRCA1, BRCA2, BRIP1, CDH1, CDKN2A, CHEK2, CTNNA1, DICER1, EPCAM, GREM1, KIT, MEN1, MLH1, MSH2, MSH3, MSH6, MUTYH, NBN, NF1, NTHL1, PALB2, PDGFRA, PMS2, POLD1, POLE, PTEN, RAD50, RAD51C, RAD51D, SDH  . Headache    HX MIGRAINES    . Heart murmur   . History of radiation therapy 02/14/17-04/03/2017   left breast 60.4 Gy: 50.4 Gy delivered in 28 fractions and a boost of 10 Gy delivered in 5 fractions.  . Hypertension   . Malignant  neoplasm of upper-outer quadrant of left female breast ([employer]) 08/23/2016  . MVP (mitral valve prolapse)    AS INFANT  . Neck pain   . Personal history of chemotherapy   . Personal history of radiation therapy   . Vision abnormalities     Social History   Socioeconomic History  . Marital status: Married    Spouse name: Not on file  . Number of children: 4  . Years of education: Not on file  . Highest education level: Not on file  Social Needs  . Financial resource strain: Not on file  . Food insecurity - worry: Not on file  . Food insecurity - inability: Not on file  . Transportation needs - medical: Not on file  . Transportation needs - non-medical: Not on file  Occupational History  . Not on file  Tobacco Use  . Smoking status: Former Smoker    Packs/day: 0.25    Types: Cigarettes    Last attempt to quit: 01/13/2017    Years since quitting: 0.6  . Smokeless tobacco: Never Used  Substance and Sexual Activity  . Alcohol use: No    Alcohol/week: 0.0 oz  . Drug use: No  . Sexual activity: Yes    Birth control/protection: Post-menopausal  Other Topics Concern  . Not on file  Social History Narrative  . Not on file    Past Surgical History:  Procedure Laterality Date  . cervical fusiion  2012  .  JOINT REPLACEMENT    . MITRAL VALVE REPAIR     MVP 1970  . PORTACATH PLACEMENT Right 09/06/2016   Procedure: INSERTION PORT-A-CATH;  Surgeon: [name], MD;  Location: [place];  Service: General;  Laterality: Right;  . RADIOACTIVE SEED GUIDED PARTIAL MASTECTOMY WITH AXILLARY SENTINEL LYMPH NODE BIOPSY Left 09/06/2016   Procedure: RADIOACTIVE SEED GUIDED LEFT BREAST LUMPECTOMY  WITH SENTINEL LYMPH NODE BIOPSY;  Surgeon: [name], MD;  Location: [place];  Service: General;  Laterality: Left;  . ROTATOR CUFF REPAIR Right 2012  . TOTAL HIP ARTHROPLASTY Right 09/14/2015   Procedure: RIGHT TOTAL HIP ARTHROPLASTY ANTERIOR APPROACH;  Surgeon:  Leandrew Koyanagi, MD;  Location: [place];  Service: Orthopedics;  Laterality: Right;  . TUBAL LIGATION  1988    Family History  Adopted: Yes  Problem Relation Age of Onset  . Breast cancer Brother 22       Maternal half-brother. Currently 34.  Marland Kitchen BRCA 1/2 Neg Hx     Allergies  Allergen Reactions  . Gabapentin Swelling    "makes me feel detached"  . Naproxen Palpitations  . Neulasta [Pegfilgrastim] Itching and Other (See Comments)    Patient stated,"reddish skin tone, body on fire, tingly tongue."  . Erythromycin Nausea And Vomiting  . Tramadol Hcl Other (See Comments)    Headaches    Current Outpatient Medications on File Prior to Visit  Medication Sig Dispense Refill  . azithromycin (ZITHROMAX) 250 MG tablet Take 2 tabs today, then 1 tab daily x 4 days 6 tablet 0  . hydrochlorothiazide (HYDRODIURIL) 25 MG tablet Take 1 tablet (25 mg total) by mouth daily. MUST SCHEDULE ANNUAL EXAM 90 tablet 3  . ibuprofen (ADVIL,MOTRIN) 200 MG tablet Take 400 mg by mouth every 4 (four) hours as needed for headache, mild pain or moderate pain.     Marland Kitchen oxyCODONE-acetaminophen (PERCOCET/ROXICET) 5-325 MG tablet Take 1 tablet by mouth every 8 (eight) hours as needed for severe pain. 60 tablet 0   No current facility-administered medications on file prior to visit.     BP 122/80   Pulse 90   Temp 97.9 F (36.6 C) (Oral)   Wt 221 lb (100.2 kg)   LMP 11/07/2016   SpO2 98%   BMI 38.53 kg/m       Objective:   Physical Exam  Constitutional: She is oriented to person, place, and time. She appears well-developed and well-nourished. She appears ill. No distress.  HENT:  Right Ear: Hearing, external ear and ear canal normal. Tympanic membrane is bulging.  Left Ear: Hearing, external ear and ear canal normal. Tympanic membrane is bulging.  Nose: Mucosal edema and rhinorrhea present. Right sinus exhibits no maxillary sinus tenderness and no frontal sinus tenderness. Left sinus exhibits no maxillary sinus  tenderness and no frontal sinus tenderness.  Clear fluid behind both TM's   Neck: Normal range of motion. Neck supple.  Cardiovascular: Normal rate, regular rhythm and intact distal pulses. Exam reveals no gallop and no friction rub.  No murmur heard. Pulmonary/Chest: Effort normal and breath sounds normal. No respiratory distress. She has no wheezes. She has no rales. She exhibits no tenderness.  Lymphadenopathy:       Head (right side): Submandibular and tonsillar adenopathy present.    She has no cervical adenopathy.  Neurological: She is alert and oriented to person, place, and time.  Skin: Skin is warm and dry. No rash noted. She is not diaphoretic. No erythema. No pallor.  Psychiatric: She has a normal mood and affect. Her behavior is normal. Judgment and thought content normal.  Nursing note and vitals reviewed.     Assessment & Plan:  1. Upper respiratory tract infection, unspecified type - Stay hydrated and rest.  - Work note given  - Follow up with PCP if no improvement  - HYDROcodone-homatropine (HYCODAN) 5-1.5 MG/5ML syrup; Take 5 mLs by mouth every 8 (eight) hours as needed for cough.  Dispense: 120 mL; Refill: 0 - fluticasone (FLONASE) 50 MCG/ACT nasal spray; Place 2 sprays into both nostrils daily.  Dispense: 16 g; Refill: 6 - doxycycline (VIBRAMYCIN) 100 MG capsule; Take 1 capsule (100 mg total) by mouth 2 (two) times daily.  Dispense: 14 capsule; Refill: 0  Dorothyann Peng, NP

## 2017-09-27 ENCOUNTER — Telehealth: Payer: Self-pay | Admitting: Hematology and Oncology

## 2017-09-27 NOTE — Telephone Encounter (Signed)
S/w pt to confirm appt time chg on 1/16 to 10.45am. Pt confirmed.

## 2017-09-30 ENCOUNTER — Encounter: Payer: Self-pay | Admitting: Hematology and Oncology

## 2017-09-30 NOTE — Progress Notes (Signed)
Reviewed billing/self-pay balance for charges to be submitted for copay assistance.  Called patient to confirm she needed to re-enroll in copay program for Herceptin/Perjeta. Left voicemail for her to return my call.  Called [employer](Jamilla) to advise of re-enrollment process. She states patient was automatically re-enrolled and good through 09/18/2018.She advised me of the new id number and bin number and states it should be updated on the portal and patient would receive a Production assistant, radio.   Patient returned my call and I explained automatic re-enrollment to her and asked her to provide me with a copy of welcome letter once received. She verbalized understanding.  Called [employer](Mercedes) back because I could not locate payment card information. She gave me a generic card# to run in order for her to register my terminal to accept payments. She completed her process and advised me within 72 hours, I would be able to locate card information to run payments under the review tab. She also explained how to submit a claim to the portal.

## 2017-10-01 NOTE — Assessment & Plan Note (Signed)
Left lumpectomy 09/06/16: IDC grade 2, 3.4 cm, Margins Neg, 0/1 LN neg, T2N0 ER 90%, PR 70%, Ki-67 30%, HER-2 positive ratio 2.43 (stage 2 A)  Treatment Plan: 1. adjuvant chemotherapy TCHPfollowed by Herceptin-Perjetamaintenance to complete 1 year. 2. Adjuvant radiation therapycompleted 04/03/2017 3. Adjuvant antiestrogen therapy with Tamoxifen x 1 month (unable to tolerate) ----------------------------------------------------------------------------- Current Treatment: Herceptin/Perjeta maintenance today is her last treatment Plan: Wait 2 months and start antiestrogen therapy by patient preference I sent a prescription for letrozole 2.5 mg p.o. Daily  Letrozole counseling:We discussed the risks and benefits of anti-estrogen therapy with aromatase inhibitors. These include but not limited to insomnia, hot flashes, mood changes, vaginal dryness, bone density loss, and weight gain. We strongly believe that the benefits far outweigh the risks. Patient understands these risks and consented to starting treatment. Planned treatment duration is 5 years.

## 2017-10-02 ENCOUNTER — Inpatient Hospital Stay: Payer: BLUE CROSS/BLUE SHIELD

## 2017-10-02 ENCOUNTER — Other Ambulatory Visit: Payer: Self-pay | Admitting: General Surgery

## 2017-10-02 ENCOUNTER — Inpatient Hospital Stay: Payer: BLUE CROSS/BLUE SHIELD | Attending: Hematology and Oncology

## 2017-10-02 ENCOUNTER — Inpatient Hospital Stay (HOSPITAL_BASED_OUTPATIENT_CLINIC_OR_DEPARTMENT_OTHER): Payer: BLUE CROSS/BLUE SHIELD | Admitting: Hematology and Oncology

## 2017-10-02 DIAGNOSIS — C50412 Malignant neoplasm of upper-outer quadrant of left female breast: Secondary | ICD-10-CM | POA: Diagnosis not present

## 2017-10-02 DIAGNOSIS — M797 Fibromyalgia: Secondary | ICD-10-CM | POA: Insufficient documentation

## 2017-10-02 DIAGNOSIS — Z1212 Encounter for screening for malignant neoplasm of rectum: Secondary | ICD-10-CM | POA: Diagnosis not present

## 2017-10-02 DIAGNOSIS — Z170 Estrogen receptor positive status [ER+]: Secondary | ICD-10-CM | POA: Diagnosis not present

## 2017-10-02 DIAGNOSIS — Z5112 Encounter for antineoplastic immunotherapy: Secondary | ICD-10-CM | POA: Diagnosis not present

## 2017-10-02 DIAGNOSIS — G629 Polyneuropathy, unspecified: Secondary | ICD-10-CM | POA: Insufficient documentation

## 2017-10-02 DIAGNOSIS — Z1211 Encounter for screening for malignant neoplasm of colon: Secondary | ICD-10-CM | POA: Diagnosis not present

## 2017-10-02 DIAGNOSIS — Z95828 Presence of other vascular implants and grafts: Secondary | ICD-10-CM

## 2017-10-02 LAB — CBC WITH DIFFERENTIAL/PLATELET
Basophils Absolute: 0 10*3/uL (ref 0.0–0.1)
Basophils Relative: 0 %
Eosinophils Absolute: 0.2 10*3/uL (ref 0.0–0.5)
Eosinophils Relative: 2 %
HEMATOCRIT: 37.8 % (ref 34.8–46.6)
Hemoglobin: 12.8 g/dL (ref 11.6–15.9)
Lymphocytes Relative: 15 %
Lymphs Abs: 1.4 10*3/uL (ref 0.9–3.3)
MCH: 30 pg (ref 25.1–34.0)
MCHC: 33.9 g/dL (ref 31.5–36.0)
MCV: 88.7 fL (ref 79.5–101.0)
MONO ABS: 0.4 10*3/uL (ref 0.1–0.9)
MONOS PCT: 5 %
NEUTROS ABS: 6.8 10*3/uL — AB (ref 1.5–6.5)
Neutrophils Relative %: 78 %
Platelets: 284 10*3/uL (ref 145–400)
RBC: 4.26 MIL/uL (ref 3.70–5.45)
RDW: 13.9 % (ref 11.2–16.1)
WBC: 8.8 10*3/uL (ref 3.9–10.3)

## 2017-10-02 LAB — COMPREHENSIVE METABOLIC PANEL
ALBUMIN: 3.8 g/dL (ref 3.5–5.0)
ALK PHOS: 85 U/L (ref 40–150)
ALT: 14 U/L (ref 0–55)
ANION GAP: 10 (ref 3–11)
AST: 15 U/L (ref 5–34)
BUN: 20 mg/dL (ref 7–26)
CO2: 25 mmol/L (ref 22–29)
Calcium: 9.4 mg/dL (ref 8.4–10.4)
Chloride: 101 mmol/L (ref 98–109)
Creatinine, Ser: 1.05 mg/dL (ref 0.60–1.10)
GFR calc Af Amer: >60 mL/min (ref >60)
GFR calc non Af Amer: 59 mL/min — ABNORMAL LOW (ref >60)
GLUCOSE: 126 mg/dL (ref 70–140)
POTASSIUM: 3.6 mmol/L (ref 3.3–4.7)
SODIUM: 136 mmol/L (ref 136–145)
Total Bilirubin: 0.4 mg/dL (ref 0.2–1.2)
Total Protein: 7.3 g/dL (ref 6.4–8.3)

## 2017-10-02 MED ORDER — DIPHENHYDRAMINE HCL 25 MG PO CAPS
ORAL_CAPSULE | ORAL | Status: AC
  Filled 2017-10-02: qty 1

## 2017-10-02 MED ORDER — SODIUM CHLORIDE 0.9% FLUSH
10.0000 mL | INTRAVENOUS | Status: DC | PRN
  Administered 2017-10-02: 10 mL via INTRAVENOUS
  Filled 2017-10-02: qty 10

## 2017-10-02 MED ORDER — ACETAMINOPHEN 325 MG PO TABS
650.0000 mg | ORAL_TABLET | Freq: Once | ORAL | Status: AC
  Administered 2017-10-02: 650 mg via ORAL

## 2017-10-02 MED ORDER — HEPARIN SOD (PORK) LOCK FLUSH 100 UNIT/ML IV SOLN
500.0000 [IU] | Freq: Once | INTRAVENOUS | Status: AC | PRN
  Administered 2017-10-02: 500 [IU]
  Filled 2017-10-02: qty 5

## 2017-10-02 MED ORDER — ACETAMINOPHEN 325 MG PO TABS
ORAL_TABLET | ORAL | Status: AC
  Filled 2017-10-02: qty 2

## 2017-10-02 MED ORDER — SODIUM CHLORIDE 0.9 % IV SOLN
Freq: Once | INTRAVENOUS | Status: AC
  Administered 2017-10-02: 12:00:00 via INTRAVENOUS

## 2017-10-02 MED ORDER — SODIUM CHLORIDE 0.9 % IV SOLN
420.0000 mg | Freq: Once | INTRAVENOUS | Status: AC
  Administered 2017-10-02: 420 mg via INTRAVENOUS
  Filled 2017-10-02: qty 14

## 2017-10-02 MED ORDER — TRASTUZUMAB CHEMO 150 MG IV SOLR
600.0000 mg | Freq: Once | INTRAVENOUS | Status: AC
  Administered 2017-10-02: 600 mg via INTRAVENOUS
  Filled 2017-10-02: qty 28.57

## 2017-10-02 MED ORDER — SODIUM CHLORIDE 0.9% FLUSH
10.0000 mL | INTRAVENOUS | Status: DC | PRN
  Administered 2017-10-02: 10 mL
  Filled 2017-10-02: qty 10

## 2017-10-02 MED ORDER — DIPHENHYDRAMINE HCL 25 MG PO CAPS
50.0000 mg | ORAL_CAPSULE | Freq: Once | ORAL | Status: AC
  Administered 2017-10-02: 50 mg via ORAL

## 2017-10-02 MED ORDER — LETROZOLE 2.5 MG PO TABS: 2.5000 mg | ORAL_TABLET | Freq: Every day | ORAL | 3 refills | Status: DC

## 2017-10-02 NOTE — Patient Instructions (Signed)
[facility] Discharge Instructions for Patients Receiving Chemotherapy  Today you received the following chemotherapy agents :  Herceptin, Perjeta.  To help prevent nausea and vomiting after your treatment, we encourage you to take your nausea medication as prescribed.   If you develop nausea and vomiting that is not controlled by your nausea medication, call the clinic.   BELOW ARE SYMPTOMS THAT SHOULD BE REPORTED IMMEDIATELY:  *FEVER GREATER THAN 100.5 F  *CHILLS WITH OR WITHOUT FEVER  NAUSEA AND VOMITING THAT IS NOT CONTROLLED WITH YOUR NAUSEA MEDICATION  *UNUSUAL SHORTNESS OF BREATH  *UNUSUAL BRUISING OR BLEEDING  TENDERNESS IN MOUTH AND THROAT WITH OR WITHOUT PRESENCE OF ULCERS  *URINARY PROBLEMS  *BOWEL PROBLEMS  UNUSUAL RASH Items with * indicate a potential emergency and should be followed up as soon as possible.  Feel free to call the clinic should you have any questions or concerns. The clinic phone number is [phone].  Please show the CHEMO ALERT CARD at check-in to the Emergency Department and triage nurse.   

## 2017-10-02 NOTE — Progress Notes (Signed)
Patient Care Team: [name], NP as PCP - General (Internal Medicine) [name], MD as Consulting Physician (General Surgery) Nicholas Lose, MD as Consulting Physician (Hematology and Oncology) Gery Pray, MD as Consulting Physician (Radiation Oncology) Delice Bison Charlestine Massed, NP as Nurse Practitioner (Hematology and Oncology) Larey Dresser, MD as Consulting Physician (Cardiology)  DIAGNOSIS:  Encounter Diagnosis  Name Primary?  . Malignant neoplasm of upper-outer quadrant of left breast in female, estrogen receptor positive ([place])     SUMMARY OF ONCOLOGIC HISTORY:   Malignant neoplasm of upper-outer quadrant of left female breast ([place])   08/21/2016 Initial Diagnosis    Screening detected left breast mass UOQ at 1:00: 1.2 x 1 x 0.5 cm, axilla negative Left breast biopsy 1:00: IDC with DCIS, grade 1, ER 90%, PR 70%, Ki-67 30%, HER-2 positive ratio 2.43, T1c N0 stage IA clinical stage      09/06/2016 Surgery    Left lumpectomy: IDC grade 2, 3.4 cm, Margins Neg, 0/1 LN neg, ER 90%, PR 70%, Ki-67 30%, HER-2 positive ratio 2.43 T2N0 (stage 2 A)      10/09/2016 -  Chemotherapy    TCH Perjeta 6 cycles (Dose reduced due to peripheral neuropathy/neutropenia, last cycle without Taxotere due  followed by Herceptin Perjeta maintenance for 1 year      12/20/2016 Genetic Testing    Genetic counseling and testing for hereditary cancer syndromes performed on 11/22/2016. Results are negative for pathogenic mutations in 46 genes analyzed by [facility] Common Hereditary Cancers Panel. Results are dated 12/20/2016. Genes tested: APC, ATM, AXIN2, BARD1, BMPR1A, BRCA1, BRCA2, BRIP1, CDH1, CDKN2A, CHEK2, CTNNA1, DICER1, EPCAM, GREM1, KIT, MEN1, MLH1, MSH2, MSH3, MSH6, MUTYH, NBN, NF1, NTHL1, PALB2, PDGFRA, PMS2, POLD1, POLE, PTEN, RAD50, RAD51C, RAD51D, SDHA, SDHB, SDHC, SDHD, SMAD4, SMARCA4, STK11, TP53, TSC1, TSC2, and VHL.  A variant of uncertain significance (not clinically actionable)  was noted in the KIT gene called c.[id number]>G (p.Ser867Arg). At this time, it is unknown if this variant is associated with increased cancer risk or if this is a normal finding. It should not be used to make medical management decisions.      02/14/2017 - 04/03/2017 Radiation Therapy    Adjuvant radiation ([place]): The left breast was treated to 60.4 Gy, with a primary dose of 50.4 Gy delivered in 28 fractions of 1.8 Gy and a boost dose of 10 Gy delivered in 5 fractions of 2 Gy.      04/2017 - 05/2017 Anti-estrogen oral therapy    Tamoxifen daily        CHIEF COMPLIANT: Last Herceptin and Perjeta treatment  INTERVAL HISTORY: Stacy [name] is a 57 year old with above-mentioned history of left breast cancer treated with adjuvant chemotherapy with Monon followed by adjuvant radiation.  She is receiving Herceptin and Perjeta maintenance every 3 weeks and today is her last and final treatment with Herceptin and Perjeta.  She is ecstatic and very happy to date.  She continues to have neuropathic pain in her feet.  It is more pain rather than neuropathy.  It could be related to her fibromyalgia.  Previously she was been intolerant to gabapentin and Lyrica and Cymbalta.  REVIEW OF SYSTEMS:   Constitutional: Denies fevers, chills or abnormal weight loss Eyes: Denies blurriness of vision Ears, nose, mouth, throat, and face: Denies mucositis or sore throat Respiratory: Denies cough, dyspnea or wheezes Cardiovascular: Denies palpitation, chest discomfort Gastrointestinal:  Denies nausea, heartburn or change in bowel habits Skin: Denies abnormal skin rashes Lymphatics: Denies  new lymphadenopathy or easy bruising Neurological: Neuropathy in the feet Behavioral/Psych: Mood is stable, no new changes  Extremities: No lower extremity edema All other systems were reviewed with the patient and are negative.  I have reviewed the past medical history, past surgical history, social history and family history  with the patient and they are unchanged from previous note.  ALLERGIES:  is allergic to gabapentin; naproxen; neulasta [pegfilgrastim]; erythromycin; and tramadol hcl.  MEDICATIONS:  Current Outpatient Medications  Medication Sig Dispense Refill  . hydrochlorothiazide (HYDRODIURIL) 25 MG tablet Take 1 tablet (25 mg total) by mouth daily. MUST SCHEDULE ANNUAL EXAM 90 tablet 3  . ibuprofen (ADVIL,MOTRIN) 200 MG tablet Take 400 mg by mouth every 4 (four) hours as needed for headache, mild pain or moderate pain.     Marland Kitchen letrozole (FEMARA) 2.5 MG tablet Take 1 tablet (2.5 mg total) by mouth daily. 90 tablet 3   No current facility-administered medications for this visit.     PHYSICAL EXAMINATION: ECOG PERFORMANCE STATUS: 1 - Symptomatic but completely ambulatory  Vitals:   10/02/17 1057  BP: (!) 137/92  Pulse: 95  Resp: 18  Temp: 97.9 F (36.6 C)  SpO2: 95%   Filed Weights   10/02/17 1057  Weight: 221 lb 4.8 oz (100.4 kg)    GENERAL:alert, no distress and comfortable SKIN: skin color, texture, turgor are normal, no rashes or significant lesions EYES: normal, Conjunctiva are pink and non-injected, sclera clear OROPHARYNX:no exudate, no erythema and lips, buccal mucosa, and tongue normal  NECK: supple, thyroid normal size, non-tender, without nodularity LYMPH:  no palpable lymphadenopathy in the cervical, axillary or inguinal LUNGS: clear to auscultation and percussion with normal breathing effort HEART: regular rate & rhythm and no murmurs and no lower extremity edema ABDOMEN:abdomen soft, non-tender and normal bowel sounds MUSCULOSKELETAL:no cyanosis of digits and no clubbing  NEURO: alert & oriented x 3 with fluent speech, severe peripheral neuropathy EXTREMITIES: No lower extremity edema  LABORATORY DATA:  I have reviewed the data as listed CMP Latest Ref Rng & Units 09/11/2017 08/21/2017 07/10/2017  Glucose 70 - 140 mg/dl 98 117 97  BUN 7.0 - 26.0 mg/dL 18.2 16.9 17.3    Creatinine 0.6 - 1.1 mg/dL 1.0 1.1 1.0  Sodium 136 - 145 mEq/L 138 135(L) 137  Potassium 3.5 - 5.1 mEq/L 3.5 3.5 4.1  Chloride 101 - 111 mmol/L - - -  CO2 22 - 29 mEq/L 25 22 25   Calcium 8.4 - 10.4 mg/dL 9.4 9.5 9.7  Total Protein 6.4 - 8.3 g/dL 7.4 7.5 7.5  Total Bilirubin 0.20 - 1.20 mg/dL 0.42 0.42 0.31  Alkaline Phos 40 - 150 U/L 93 82 80  AST 5 - 34 U/L 15 17 15   ALT 0 - 55 U/L 13 14 11     Lab Results  Component Value Date   WBC 8.8 10/02/2017   HGB 12.8 10/02/2017   HCT 37.8 10/02/2017   MCV 88.7 10/02/2017   PLT 284 10/02/2017   NEUTROABS 6.8 (H) 10/02/2017    ASSESSMENT & PLAN:  Malignant neoplasm of upper-outer quadrant of left female breast ([facility]) Left lumpectomy 09/06/16: IDC grade 2, 3.4 cm, Margins Neg, 0/1 LN neg, T2N0 ER 90%, PR 70%, Ki-67 30%, HER-2 positive ratio 2.43 (stage 2 A)  Treatment Plan: 1. adjuvant chemotherapy TCHPfollowed by Herceptin-Perjetamaintenance to complete 1 year. 2. Adjuvant radiation therapycompleted 04/03/2017 3. Adjuvant antiestrogen therapy with Tamoxifen x 1 month (unable to tolerate) ----------------------------------------------------------------------------- Current Treatment: Herceptin/Perjeta maintenance today is her  last treatment Plan: Wait 2 months and start antiestrogen therapy by patient preference I sent a prescription for letrozole 2.5 mg p.o. Daily.  She may start half a tablet daily and increase it to the full tablet if she tolerates it  Letrozole counseling:We discussed the risks and benefits of anti-estrogen therapy with aromatase inhibitors. These include but not limited to insomnia, hot flashes, mood changes, vaginal dryness, bone density loss, and weight gain. We strongly believe that the benefits far outweigh the risks. Patient understands these risks and consented to starting treatment. Planned treatment duration is 5 years.  Peripheral neuropathy and fibromyalgia: Patient cannot tolerate any neuropathic pain  medication like gabapentin or Lyrica or Cymbalta.  We are watching and monitoring it.  She took some Percocets and felt very dizzy and drowsy and she does not want to take them.  Return to clinic in 66month to follow-up on adverse effects to letrozole. I spent 25 minutes talking to the patient of which more than half was spent in counseling and coordination of care.  No orders of the defined types were placed in this encounter.  The patient has a good understanding of the overall plan. she agrees with it. she will call with any problems that may develop before the next visit here.   VHarriette Ohara MD 10/02/17

## 2017-10-03 LAB — COLOGUARD: COLOGUARD: NEGATIVE

## 2017-10-04 ENCOUNTER — Telehealth: Payer: Self-pay | Admitting: Hematology and Oncology

## 2017-10-04 NOTE — Telephone Encounter (Signed)
09/25/17 @ 2:10 pm spoke with patient confirming FMLA/Disabitliy paperwork was completed and faxed to [employer] @ [phone].  Patient requested a copy to be mailed to [place], [place] [address] for personal copy.

## 2017-10-04 NOTE — Telephone Encounter (Signed)
Mailed letter to patient regarding upcoming June appointments.

## 2017-10-15 ENCOUNTER — Encounter: Payer: Self-pay | Admitting: Internal Medicine

## 2017-10-18 ENCOUNTER — Encounter (HOSPITAL_COMMUNITY)
Admission: RE | Admit: 2017-10-18 | Discharge: 2017-10-18 | Disposition: A | Discharge status: Home / Self Care | Payer: BLUE CROSS/BLUE SHIELD | Source: Ambulatory Visit | Attending: General Surgery | Admitting: General Surgery

## 2017-10-18 ENCOUNTER — Other Ambulatory Visit: Payer: Self-pay

## 2017-10-18 ENCOUNTER — Encounter (HOSPITAL_COMMUNITY): Payer: Self-pay

## 2017-10-18 DIAGNOSIS — Z01812 Encounter for preprocedural laboratory examination: Secondary | ICD-10-CM | POA: Diagnosis not present

## 2017-10-18 LAB — CBC
HCT: 40.1 % (ref 36.0–46.0)
Hemoglobin: 13.8 g/dL (ref 12.0–15.0)
MCH: 30.4 pg (ref 26.0–34.0)
MCHC: 34.4 g/dL (ref 30.0–36.0)
MCV: 88.3 fL (ref 78.0–100.0)
PLATELETS: 276 10*3/uL (ref 150–400)
RBC: 4.54 MIL/uL (ref 3.87–5.11)
RDW: 13.9 % (ref 11.5–15.5)
WBC: 10.3 10*3/uL (ref 4.0–10.5)

## 2017-10-18 LAB — BASIC METABOLIC PANEL
Anion gap: 11 (ref 5–15)
BUN: 14 mg/dL (ref 6–20)
CALCIUM: 9.4 mg/dL (ref 8.9–10.3)
CHLORIDE: 100 mmol/L — AB (ref 101–111)
CO2: 25 mmol/L (ref 22–32)
CREATININE: 1.22 mg/dL — AB (ref 0.44–1.00)
GFR calc non Af Amer: 49 mL/min — ABNORMAL LOW (ref >60)
GFR, EST AFRICAN AMERICAN: 57 mL/min — AB (ref >60)
Glucose, Bld: 99 mg/dL (ref 65–99)
Potassium: 3.7 mmol/L (ref 3.5–5.1)
SODIUM: 136 mmol/L (ref 135–145)

## 2017-10-18 NOTE — Pre-Procedure Instructions (Signed)
Stacy [name]  10/18/2017      [id number] [address], [place] [address] Phone: [phone] Fax: [phone]  [place], [place] [address] [place] [address] Phone: [phone] Fax: [phone]    Your procedure is scheduled on Wednesday February 6.  Report to [facility] Admitting at 6:30 A.M.  Call this number if you have problems the morning of surgery:  [phone]   Remember:  Do not eat food or drink liquids after midnight.  Take these medicines the morning of surgery with A SIP OF WATER: letrozole (femara)  7 days prior to surgery STOP taking any Aspirin(unless otherwise instructed by your surgeon), Aleve, Naproxen, Ibuprofen, Motrin, Advil, Goody's, BC's, all herbal medications, fish oil, and all vitamins    Do not wear jewelry, make-up or nail polish.  Do not wear lotions, powders, or perfumes, or deodorant.  Do not shave 48 hours prior to surgery.  Men may shave face and neck.  Do not bring valuables to the hospital.  [facility] is not responsible for any belongings or valuables.  Contacts, dentures or bridgework may not be worn into surgery.  Leave your suitcase in the car.  After surgery it may be brought to your room.  For patients admitted to the hospital, discharge time will be determined by your treatment team.  Patients discharged the day of surgery will not be allowed to drive home.   Special instructions:    - Preparing For Surgery  Before surgery, you can play an important role. Because skin is not sterile, your skin needs to be as free of germs as possible. You can reduce the number of germs on your skin by washing with CHG (chlorahexidine gluconate) Soap before surgery.  CHG is an antiseptic cleaner which kills germs and bonds with the skin to continue killing germs even after  washing.  Please do not use if you have an allergy to CHG or antibacterial soaps. If your skin becomes reddened/irritated stop using the CHG.  Do not shave (including legs and underarms) for at least 48 hours prior to first CHG shower. It is OK to shave your face.  Please follow these instructions carefully.   1. Shower the NIGHT BEFORE SURGERY and the MORNING OF SURGERY with CHG.   2. If you chose to wash your hair, wash your hair first as usual with your normal shampoo.  3. After you shampoo, rinse your hair and body thoroughly to remove the shampoo.  4. Use CHG as you would any other liquid soap. You can apply CHG directly to the skin and wash gently with a scrungie or a clean washcloth.   5. Apply the CHG Soap to your body ONLY FROM THE NECK DOWN.  Do not use on open wounds or open sores. Avoid contact with your eyes, ears, mouth and genitals (private parts). Wash Face and genitals (private parts)  with your normal soap.  6. Wash thoroughly, paying special attention to the area where your surgery will be performed.  7. Thoroughly rinse your body with warm water from the neck down.  8. DO NOT shower/wash with your normal soap after using and rinsing off the CHG Soap.  9. Pat yourself dry with a CLEAN TOWEL.  10. Wear CLEAN PAJAMAS to bed the night before surgery, wear comfortable clothes the morning of surgery  11. Place  CLEAN SHEETS on your bed the night of your first shower and DO NOT SLEEP WITH PETS.    Day of Surgery: Do not apply any deodorants/lotions. Please wear clean clothes to the hospital/surgery center.      Please read over the following fact sheets that you were given. Coughing and Deep Breathing and Surgical Site Infection Prevention

## 2017-10-18 NOTE — Progress Notes (Signed)
PCP - Webb Silversmith Cardiologist - Dr. Aundra Dubin  Chest x-ray - 01/27/17 EKG - 01/30/17 ECHO - 07/26/17  Patient denies shortness of breath, fever, cough and chest pain at PAT appointment   Patient verbalized understanding of instructions that were given to them at the PAT appointment. Patient was also instructed that they will need to review over the PAT instructions again at home before surgery.

## 2017-10-22 NOTE — H&P (Signed)
Stacy [name] 10/08/2016 10:28 AM Location: [facility] Patient #: [id number] DOB: Jul 14, 1961 Married / Language: Cleophus Molt / Race: White Female   History of Present Illness [name] MD; 10/08/2016 10:59 AM) The patient is a 57 year old female who presents with breast cancer. Patient is a 57 yo F who was referred by Dr. Webb Silversmith for consultation regarding a new diagnosis of left breast cancer. She presented with a screening detected left breast mass. Diagnostic imaging showed a 1.2 cm mass at 1 o'clock. The axilla was negative for concerning lymphadenopathy. Core needle biopsy was performed and showed a grade 1 invasive ductal carcinoma that is ER and PR +, her-2 positive, and Ki 67 was 30%. She is adopted and does not know family history. She had menarche at age 72. She is still having periods, but they are irregular. She is a G4P4, with first child at age 22. She has not used hormonal contraception. She is accompanied by a son and her husband. She is s/p mitral valve repair at age 81.   She had left lumpectomy, SLN bx, port placement 09/06/2016. Her clip was not in the specimen. She has had subsequent mammogram and no clip was retained in the breast. She is starting chemo tomorrow. She does have some heaviness in her left arm. She also complains of some numbness in her axilla.   Diagnosis 1. Breast, lumpectomy, Left - INVASIVE DUCTAL CARCINOMA, GRADE I/III, SPANNING 3.4 CM. - DUCTAL CARCINOMA IN SITU, INTERMEDIATE GRADE. - INVASIVE CARCINOMA IS BROADLY PRESENT AT THE LATERAL MARGIN OF SPECIMEN #1. - DUCTAL CARCINOMA IN SITU IS BROADLY LESS THAN 0.1 CM TO THE LATERAL MARGIN OF SPECIMEN #1. - SEE ONCOLOGY TABLE BELOW. 2. Lymph node, sentinel, biopsy, Left axilla #1 - THERE IS NO EVIDENCE OF CARCINOMA IN 1 OF 1 LYMPH NODE (0/1). 3. Breast, excision, Left additional lateral margin - FIBROCYSTIC CHANGES WITH ADENOSIS AND CALCIFICATIONS. - THERE IS NO EVIDENCE  OF MALIGNANCY. - SEE COMMENT. 4. Breast, excision, Left additional anterior margin - FIBROCYSTIC CHANGES WITH ADENOSIS AND CALCIFICATIONS. - FIBROADENOMA. - THERE IS NO EVIDENCE OF MALIGNANCY. - SEE COMMENT. 5. Breast, excision, Left additional medial margin - FIBROCYSTIC CHANGES WITH ADENOSIS AND CALCIFICATIONS. - RADIAL SCAR. - THERE IS NO EVIDENCE OF MALIGNANCY. - SEE COMMENT. 6. Lymph node, sentinel, biopsy, Left axilla #2 - THERE IS NO EVIDENCE OF CARCINOMA OF 1 OF 1 LYMPH NODE (0/1).    Medication History Malachy Moan, RMA; 10/08/2016 10:31 AM) Dexamethasone (4MG Tablet, Oral as directed) Active. LORazepam (0.5MG Tablet, Oral as needed) Active. Lidocaine-Prilocaine (2.5-2.5% Cream, External) Active. (starts tomorrow) Ondansetron HCl (8MG Tablet, Oral) Active. Prochlorperazine Maleate (10MG Tablet, Oral) Active. Calcium Carbonate-Vitamin D (600-400MG-UNIT Tablet, Oral) Active. Ibuprofen (200MG Tablet, Oral) Active. HydroCHLOROthiazide (25MG Tablet, Oral) Active. Medications Reconciled    Review of Systems [name] MD; 10/08/2016 11:00 AM) All other systems negative  Vitals Malachy Moan RMA; 10/08/2016 10:32 AM) 10/08/2016 10:31 AM Weight: 26 lb Height: 64in Body Surface Area: 0.82 m Body Mass Index: 4.46 kg/m  Temp.: 98.53F  Pulse: 89 (Regular)  BP: 130/90 (Sitting, Left Arm, Standard)       Physical Exam [name] MD; 10/08/2016 11:00 AM) General Mental Status-Alert. General Appearance-Consistent with stated age. Hydration-Well hydrated. Voice-Normal.  Chest and Lung Exam Chest and lung exam reveals -quiet, even and easy respiratory effort with no use of accessory muscles. Inspection Chest Wall - Normal. Back - normal.  Breast Note: minimal seroma, no hematoma present.  Assessment & Plan [name] MD; 10/08/2016 11:01 AM) MALIGNANT NEOPLASM OF UPPER-OUTER QUADRANT OF LEFT BREAST IN FEMALE,  ESTROGEN RECEPTOR POSITIVE (C50.412) Impression: Doing well. Chemo first. XRT once finished.  I will see in 9-12 months once pt done with tx. Current Plans Referred to Physical Therapy, for evaluation and follow up (Physical Therapy). Routine. Follow up in 9-12 months.   Signed by [name], MD (10/08/2016 11:01 AM)

## 2017-10-22 NOTE — Anesthesia Preprocedure Evaluation (Signed)
Anesthesia Evaluation  Patient identified by MRN, date of birth, ID band Patient awake    Reviewed: Allergy & Precautions, NPO status , Patient's Chart, lab work & pertinent test results  History of Anesthesia Complications (+) history of anesthetic complications  Airway Mallampati: III  TM Distance: >3 FB Neck ROM: Full    Dental  (+) Chipped,    Pulmonary Current Smoker, former smoker,    Pulmonary exam normal breath sounds clear to auscultation       Cardiovascular hypertension, Pt. on medications + Valvular Problems/Murmurs MR  Rhythm:Regular Rate:Normal + Systolic murmurs    Neuro/Psych  Headaches, PSYCHIATRIC DISORDERS Anxiety Depression    GI/Hepatic negative GI ROS, Neg liver ROS,   Endo/Other  Obesity  Renal/GU negative Renal ROS  negative genitourinary   Musculoskeletal  (+) Arthritis , Osteoarthritis,  Fibromyalgia -  Abdominal (+) + obese,   Peds  Hematology   Anesthesia Other Findings   Reproductive/Obstetrics negative OB ROS                             Lab Results  Component Value Date   WBC 10.3 10/18/2017   HGB 13.8 10/18/2017   HCT 40.1 10/18/2017   MCV 88.3 10/18/2017   PLT 276 10/18/2017     Chemistry      Component Value Date/Time   NA 136 10/18/2017 1538   NA 138 09/11/2017 0902   K 3.7 10/18/2017 1538   K 3.5 09/11/2017 0902   CL 100 (L) 10/18/2017 1538   CO2 25 10/18/2017 1538   CO2 25 09/11/2017 0902   BUN 14 10/18/2017 1538   BUN 18.2 09/11/2017 0902   CREATININE 1.22 (H) 10/18/2017 1538   CREATININE 1.0 09/11/2017 0902      Component Value Date/Time   CALCIUM 9.4 10/18/2017 1538   CALCIUM 9.4 09/11/2017 0902   ALKPHOS 85 10/02/2017 1026   ALKPHOS 93 09/11/2017 0902   AST 15 10/02/2017 1026   AST 15 09/11/2017 0902   ALT 14 10/02/2017 1026   ALT 13 09/11/2017 0902   BILITOT 0.4 10/02/2017 1026   BILITOT 0.42 09/11/2017 0902       Anesthesia Physical  Anesthesia Plan  ASA: II  Anesthesia Plan: MAC   Post-op Pain Management:  Regional for Post-op pain   Induction: Intravenous  PONV Risk Score and Plan: 2 and Treatment may vary due to age or medical condition, Dexamethasone and Ondansetron  Airway Management Planned: Mask and Natural Airway  Additional Equipment:   Intra-op Plan:   Post-operative Plan:   Informed Consent: I have reviewed the patients History and Physical, chart, labs and discussed the procedure including the risks, benefits and alternatives for the proposed anesthesia with the patient or authorized representative who has indicated his/her understanding and acceptance.   Dental advisory given  Plan Discussed with: Anesthesiologist, CRNA and Surgeon  Anesthesia Plan Comments:         Anesthesia Quick Evaluation

## 2017-10-23 ENCOUNTER — Ambulatory Visit (HOSPITAL_COMMUNITY): Payer: BLUE CROSS/BLUE SHIELD | Admitting: Anesthesiology

## 2017-10-23 ENCOUNTER — Encounter (HOSPITAL_COMMUNITY): Payer: Self-pay | Admitting: Certified Registered Nurse Anesthetist

## 2017-10-23 ENCOUNTER — Encounter (HOSPITAL_COMMUNITY)
Admission: RE | Disposition: A | Discharge status: Home / Self Care | Payer: Self-pay | Source: Ambulatory Visit | Attending: General Surgery

## 2017-10-23 ENCOUNTER — Ambulatory Visit (HOSPITAL_COMMUNITY)
Admission: RE | Admit: 2017-10-23 | Discharge: 2017-10-23 | Disposition: A | Discharge status: Home / Self Care | Payer: BLUE CROSS/BLUE SHIELD | Source: Ambulatory Visit | Attending: General Surgery | Admitting: General Surgery

## 2017-10-23 DIAGNOSIS — M797 Fibromyalgia: Secondary | ICD-10-CM | POA: Diagnosis not present

## 2017-10-23 DIAGNOSIS — Z452 Encounter for adjustment and management of vascular access device: Secondary | ICD-10-CM | POA: Insufficient documentation

## 2017-10-23 DIAGNOSIS — E669 Obesity, unspecified: Secondary | ICD-10-CM | POA: Insufficient documentation

## 2017-10-23 DIAGNOSIS — Z6837 Body mass index (BMI) 37.0-37.9, adult: Secondary | ICD-10-CM | POA: Diagnosis not present

## 2017-10-23 DIAGNOSIS — F418 Other specified anxiety disorders: Secondary | ICD-10-CM | POA: Insufficient documentation

## 2017-10-23 DIAGNOSIS — C50412 Malignant neoplasm of upper-outer quadrant of left female breast: Secondary | ICD-10-CM | POA: Insufficient documentation

## 2017-10-23 DIAGNOSIS — I10 Essential (primary) hypertension: Secondary | ICD-10-CM | POA: Diagnosis not present

## 2017-10-23 DIAGNOSIS — Z170 Estrogen receptor positive status [ER+]: Secondary | ICD-10-CM | POA: Diagnosis not present

## 2017-10-23 HISTORY — PX: PORT-A-CATH REMOVAL: SHX5289

## 2017-10-23 SURGERY — REMOVAL PORT-A-CATH
Anesthesia: Monitor Anesthesia Care

## 2017-10-23 MED ORDER — FENTANYL CITRATE (PF) 100 MCG/2ML IJ SOLN
INTRAMUSCULAR | Status: DC | PRN
  Administered 2017-10-23 (×2): 50 ug via INTRAVENOUS

## 2017-10-23 MED ORDER — OXYCODONE HCL 5 MG PO TABS: 5.0000 mg | ORAL_TABLET | Freq: Four times a day (QID) | ORAL | 0 refills | Status: DC | PRN

## 2017-10-23 MED ORDER — SODIUM CHLORIDE 0.9 % IV SOLN: 250.0000 mL | INTRAVENOUS | Status: DC | PRN

## 2017-10-23 MED ORDER — OXYCODONE HCL 5 MG PO TABS: 5.0000 mg | ORAL_TABLET | ORAL | Status: DC | PRN

## 2017-10-23 MED ORDER — LIDOCAINE 2% (20 MG/ML) 5 ML SYRINGE
INTRAMUSCULAR | Status: AC
  Filled 2017-10-23: qty 5

## 2017-10-23 MED ORDER — PROPOFOL 10 MG/ML IV BOLUS
INTRAVENOUS | Status: AC
  Filled 2017-10-23: qty 20

## 2017-10-23 MED ORDER — ONDANSETRON HCL 4 MG/2ML IJ SOLN
INTRAMUSCULAR | Status: AC
  Filled 2017-10-23: qty 2

## 2017-10-23 MED ORDER — SODIUM CHLORIDE 0.9% FLUSH: 3.0000 mL | Freq: Two times a day (BID) | INTRAVENOUS | Status: DC

## 2017-10-23 MED ORDER — LIDOCAINE 2% (20 MG/ML) 5 ML SYRINGE
INTRAMUSCULAR | Status: DC | PRN
  Administered 2017-10-23: 80 mg via INTRAVENOUS

## 2017-10-23 MED ORDER — 0.9 % SODIUM CHLORIDE (POUR BTL) OPTIME
TOPICAL | Status: DC | PRN
  Administered 2017-10-23: 1000 mL

## 2017-10-23 MED ORDER — ACETAMINOPHEN 500 MG PO TABS
1000.0000 mg | ORAL_TABLET | ORAL | Status: AC
  Administered 2017-10-23: 1000 mg via ORAL
  Filled 2017-10-23: qty 2

## 2017-10-23 MED ORDER — DEXAMETHASONE SODIUM PHOSPHATE 10 MG/ML IJ SOLN
INTRAMUSCULAR | Status: AC
  Filled 2017-10-23: qty 1

## 2017-10-23 MED ORDER — MIDAZOLAM HCL 5 MG/5ML IJ SOLN
INTRAMUSCULAR | Status: DC | PRN
  Administered 2017-10-23: 2 mg via INTRAVENOUS

## 2017-10-23 MED ORDER — CHLORHEXIDINE GLUCONATE CLOTH 2 % EX PADS: 6.0000 | MEDICATED_PAD | Freq: Once | CUTANEOUS | Status: DC

## 2017-10-23 MED ORDER — LIDOCAINE HCL 1 % IJ SOLN
INTRAMUSCULAR | Status: DC | PRN
  Administered 2017-10-23: 7 mL

## 2017-10-23 MED ORDER — BUPIVACAINE-EPINEPHRINE (PF) 0.5% -1:200000 IJ SOLN
INTRAMUSCULAR | Status: AC
  Filled 2017-10-23: qty 30

## 2017-10-23 MED ORDER — MIDAZOLAM HCL 2 MG/2ML IJ SOLN
INTRAMUSCULAR | Status: AC
  Filled 2017-10-23: qty 2

## 2017-10-23 MED ORDER — LIDOCAINE HCL 1 % IJ SOLN
INTRAMUSCULAR | Status: AC
  Filled 2017-10-23: qty 20

## 2017-10-23 MED ORDER — PROPOFOL 10 MG/ML IV BOLUS
INTRAVENOUS | Status: DC | PRN
  Administered 2017-10-23: 20 mg via INTRAVENOUS
  Administered 2017-10-23: 40 mg via INTRAVENOUS
  Administered 2017-10-23: 10 mg via INTRAVENOUS
  Administered 2017-10-23: 20 mg via INTRAVENOUS
  Administered 2017-10-23 (×4): 10 mg via INTRAVENOUS
  Administered 2017-10-23: 20 mg via INTRAVENOUS
  Administered 2017-10-23 (×3): 10 mg via INTRAVENOUS

## 2017-10-23 MED ORDER — CEFAZOLIN SODIUM-DEXTROSE 2-4 GM/100ML-% IV SOLN
2.0000 g | INTRAVENOUS | Status: AC
  Administered 2017-10-23: 2 g via INTRAVENOUS
  Filled 2017-10-23: qty 100

## 2017-10-23 MED ORDER — LACTATED RINGERS IV SOLN
INTRAVENOUS | Status: DC | PRN
  Administered 2017-10-23: 08:00:00 via INTRAVENOUS

## 2017-10-23 MED ORDER — FENTANYL CITRATE (PF) 250 MCG/5ML IJ SOLN
INTRAMUSCULAR | Status: AC
  Filled 2017-10-23: qty 5

## 2017-10-23 MED ORDER — NALBUPHINE HCL 10 MG/ML IJ SOLN
2.5000 mg | Freq: Once | INTRAMUSCULAR | Status: DC
  Filled 2017-10-23 (×2): qty 0.25

## 2017-10-23 MED ORDER — ACETAMINOPHEN 650 MG RE SUPP: 650.0000 mg | RECTAL | Status: DC | PRN

## 2017-10-23 MED ORDER — DEXAMETHASONE SODIUM PHOSPHATE 10 MG/ML IJ SOLN
INTRAMUSCULAR | Status: DC | PRN
  Administered 2017-10-23: 10 mg via INTRAVENOUS

## 2017-10-23 MED ORDER — ACETAMINOPHEN 325 MG PO TABS: 650.0000 mg | ORAL_TABLET | ORAL | Status: DC | PRN

## 2017-10-23 MED ORDER — SODIUM CHLORIDE 0.9% FLUSH: 3.0000 mL | INTRAVENOUS | Status: DC | PRN

## 2017-10-23 MED ORDER — ONDANSETRON HCL 4 MG/2ML IJ SOLN
INTRAMUSCULAR | Status: DC | PRN
  Administered 2017-10-23: 4 mg via INTRAVENOUS

## 2017-10-23 SURGICAL SUPPLY — 34 items
ADH SKN CLS APL DERMABOND .7 (GAUZE/BANDAGES/DRESSINGS) ×1
CHLORAPREP W/TINT 10.5 ML (MISCELLANEOUS) ×2 IMPLANT
COVER SURGICAL LIGHT HANDLE (MISCELLANEOUS) ×2 IMPLANT
DECANTER SPIKE VIAL GLASS SM (MISCELLANEOUS) ×4 IMPLANT
DERMABOND ADVANCED (GAUZE/BANDAGES/DRESSINGS) ×1
DERMABOND ADVANCED .7 DNX12 (GAUZE/BANDAGES/DRESSINGS) ×1 IMPLANT
DRAPE LAPAROTOMY 100X72 PEDS (DRAPES) ×2 IMPLANT
DRAPE UTILITY XL STRL (DRAPES) ×3 IMPLANT
ELECT CAUTERY BLADE 6.4 (BLADE) ×2 IMPLANT
ELECT REM PT RETURN 9FT ADLT (ELECTROSURGICAL) ×2
ELECTRODE REM PT RTRN 9FT ADLT (ELECTROSURGICAL) ×1 IMPLANT
GAUZE SPONGE 4X4 16PLY XRAY LF (GAUZE/BANDAGES/DRESSINGS) ×2 IMPLANT
GLOVE BIO SURGEON STRL SZ 6 (GLOVE) ×2 IMPLANT
GLOVE BIOGEL PI IND STRL 8 (GLOVE) IMPLANT
GLOVE BIOGEL PI INDICATOR 8 (GLOVE) ×1
GLOVE ECLIPSE 8.0 STRL XLNG CF (GLOVE) ×1 IMPLANT
GLOVE INDICATOR 6.5 STRL GRN (GLOVE) ×2 IMPLANT
GOWN STRL REUS W/ TWL LRG LVL3 (GOWN DISPOSABLE) ×1 IMPLANT
GOWN STRL REUS W/TWL 2XL LVL3 (GOWN DISPOSABLE) ×2 IMPLANT
GOWN STRL REUS W/TWL LRG LVL3 (GOWN DISPOSABLE) ×2
KIT BASIN OR (CUSTOM PROCEDURE TRAY) ×2 IMPLANT
KIT ROOM TURNOVER OR (KITS) ×2 IMPLANT
NDL HYPO 25GX1X1/2 BEV (NEEDLE) ×1 IMPLANT
NEEDLE HYPO 25GX1X1/2 BEV (NEEDLE) ×2 IMPLANT
NS IRRIG 1000ML POUR BTL (IV SOLUTION) ×2 IMPLANT
PACK SURGICAL SETUP 50X90 (CUSTOM PROCEDURE TRAY) ×2 IMPLANT
PAD ARMBOARD 7.5X6 YLW CONV (MISCELLANEOUS) ×4 IMPLANT
PENCIL BUTTON HOLSTER BLD 10FT (ELECTRODE) ×2 IMPLANT
SUT MON AB 4-0 PC3 18 (SUTURE) ×2 IMPLANT
SUT VIC AB 3-0 SH 27 (SUTURE) ×2
SUT VIC AB 3-0 SH 27X BRD (SUTURE) ×1 IMPLANT
SYR CONTROL 10ML LL (SYRINGE) ×2 IMPLANT
TOWEL OR 17X24 6PK STRL BLUE (TOWEL DISPOSABLE) ×2 IMPLANT
TOWEL OR 17X26 10 PK STRL BLUE (TOWEL DISPOSABLE) ×2 IMPLANT

## 2017-10-23 NOTE — Op Note (Signed)
  PRE-OPERATIVE DIAGNOSIS:  un-needed Port-A-Cath for left breast cancer  POST-OPERATIVE DIAGNOSIS:  Same   PROCEDURE:  Procedure(s):  REMOVAL PORT-A-CATH  SURGEON:  Surgeon(s):  [name], MD  ANESTHESIA:   MAC + local  EBL:   Minimal  SPECIMEN:  None  Complications : none known  Procedure:   Pt was  identified in the holding area and taken to the operating room where she was placed supine on the operating room table.  MAC anesthesia was induced.  The right upper chest was prepped and draped.  The prior incision was anesthetized with local anesthetic.  The incision was opened with a #15 blade.  The subcutaneous tissue was divided with the cautery.  The port was identified and the capsule opened.  The four 2-0 prolene sutures were removed.  The port was then removed and pressure held on the tract.  The catheter appeared intact without evidence of breakage, length was 18.5.  The wound was inspected for hemostasis, which was achieved with cautery.  The wound was closed with 3-0 vicryl deep dermal interrupted sutures and 4-0 Monocryl running subcuticular suture.  The wound was cleaned, dried, and dressed with dermabond.  The patient was awakened from anesthesia and taken to the PACU in stable condition.  Needle, sponge, and instrument counts are correct.

## 2017-10-23 NOTE — Progress Notes (Signed)
Pt c/o of itching 2.5 nubain IV ordered, medicine arrived pt. Changed her mind and did not need it anymore. Medicine returned to main pharmacy.

## 2017-10-23 NOTE — Anesthesia Postprocedure Evaluation (Signed)
Anesthesia Post Note  Patient: SHAKARIA RAPHAEL  Procedure(s) Performed: REMOVAL PORT-A-CATH (N/A )     Patient location during evaluation: PACU Anesthesia Type: MAC Level of consciousness: awake and alert Pain management: pain level controlled Vital Signs Assessment: post-procedure vital signs reviewed and stable Respiratory status: spontaneous breathing, nonlabored ventilation, respiratory function stable and patient connected to nasal cannula oxygen Cardiovascular status: stable and blood pressure returned to baseline Postop Assessment: no apparent nausea or vomiting Anesthetic complications: no    Last Vitals:  Vitals:   10/23/17 1023 10/23/17 1030  BP: 118/62   Pulse: 65 79  Resp: 20 19  Temp: 36.7 C   SpO2: 93% 97%    Last Pain:  Vitals:   10/23/17 0710  TempSrc: Oral                 Lucillia Corson EDWARD

## 2017-10-23 NOTE — Transfer of Care (Signed)
Immediate Anesthesia Transfer of Care Note  Patient: TALEEYAH BORA  Procedure(s) Performed: REMOVAL PORT-A-CATH (N/A )  Patient Location: PACU  Anesthesia Type:MAC  Level of Consciousness: awake, alert , oriented and patient cooperative  Airway & Oxygen Therapy: Patient Spontanous Breathing and Patient connected to face mask oxygen  Post-op Assessment: Report given to RN and Post -op Vital signs reviewed and stable  Post vital signs: Reviewed and stable  Last Vitals:  Vitals:   10/23/17 0710  BP: (!) 137/93  Pulse: 82  Resp: 18  Temp: 36.8 C  SpO2: 97%    Last Pain:  Vitals:   10/23/17 0710  TempSrc: Oral      Patients Stated Pain Goal: 4 (54/98/26 4158)  Complications: No apparent anesthesia complications

## 2017-10-23 NOTE — Interval H&P Note (Signed)
History and Physical Interval Note:  10/23/2017 8:39 AM  Stacy [name]  has presented today for surgery, with the diagnosis of LEFT BREAST CANCER  The various methods of treatment have been discussed with the patient and family. After consideration of risks, benefits and other options for treatment, the patient has consented to  Procedure(s): REMOVAL PORT-A-CATH (N/A) as a surgical intervention .  The patient's history has been reviewed, patient examined, no change in status, stable for surgery.  I have reviewed the patient's chart and labs.  Questions were answered to the patient's satisfaction.     [name]

## 2017-10-23 NOTE — Discharge Instructions (Addendum)
[facility] Phone Number [phone]   POST OP INSTRUCTIONS  Always review your discharge instruction sheet given to you by the facility where your surgery was performed.  IF YOU HAVE DISABILITY OR FAMILY LEAVE FORMS, YOU MUST BRING THEM TO THE OFFICE FOR PROCESSING.  DO NOT GIVE THEM TO YOUR DOCTOR.  1. A prescription for pain medication may be given to you upon discharge.  Take your pain medication as prescribed, if needed.  If narcotic pain medicine is not needed, then you may take acetaminophen (Tylenol) or ibuprofen (Advil) as needed. 2. Take your usually prescribed medications unless otherwise directed 3. If you need a refill on your pain medication, please contact your pharmacy.  They will contact our office to request authorization.  Prescriptions will not be filled after 5pm or on week-ends. 4. You should eat very light the first 24 hours after surgery, such as soup, crackers, pudding, etc.  Resume your normal diet the day after surgery 5. It is common to experience some constipation if taking pain medication after surgery.  Increasing fluid intake and taking a stool softener will usually help or prevent this problem from occurring.  A mild laxative (Milk of Magnesia or Miralax) should be taken according to package directions if there are no bowel movements after 48 hours. 6. You may shower in 48 hours.  The surgical glue will flake off in 2-3 weeks.   7. ACTIVITIES:  No strenuous activity or heavy lifting for 1 week.   a. You may drive when you no longer are taking prescription pain medication, you can comfortably wear a seatbelt, and you can safely maneuver your car and apply brakes. b. RETURN TO WORK:  __________3-7 days_______________ Dennis Bast should see your doctor in the office for a follow-up appointment approximately three-four weeks after your surgery.    WHEN TO CALL YOUR DOCTOR: 1. Fever over 101.0 2. Nausea and/or vomiting. 3. Extreme swelling or  bruising. 4. Continued bleeding from incision. 5. Increased pain, redness, or drainage from the incision.  The clinic staff is available to answer your questions during regular business hours.  Please dont hesitate to call and ask to speak to one of the nurses for clinical concerns.  If you have a medical emergency, go to the nearest emergency room or call 911.  A surgeon from [facility] is always on call at the hospital.  For further questions, please visit [email or web address]

## 2017-10-24 ENCOUNTER — Encounter (HOSPITAL_COMMUNITY): Payer: Self-pay | Admitting: General Surgery

## 2017-10-25 ENCOUNTER — Telehealth: Payer: Self-pay

## 2017-10-25 NOTE — Telephone Encounter (Signed)
Cologuard (DNA stool test) result:  NEGATIVE A negative result indicates a lower likelihood that colorectal cancer or pre-cancer is present. Letter mailed to notify pt

## 2017-10-29 ENCOUNTER — Ambulatory Visit (HOSPITAL_BASED_OUTPATIENT_CLINIC_OR_DEPARTMENT_OTHER)
Admission: RE | Admit: 2017-10-29 | Discharge: 2017-10-29 | Disposition: A | Discharge status: Home / Self Care | Payer: BLUE CROSS/BLUE SHIELD | Source: Ambulatory Visit | Attending: Cardiology | Admitting: Cardiology

## 2017-10-29 ENCOUNTER — Ambulatory Visit (HOSPITAL_COMMUNITY)
Admission: RE | Admit: 2017-10-29 | Discharge: 2017-10-29 | Disposition: A | Discharge status: Home / Self Care | Payer: BLUE CROSS/BLUE SHIELD | Source: Ambulatory Visit | Attending: Cardiology | Admitting: Cardiology

## 2017-10-29 ENCOUNTER — Encounter (HOSPITAL_COMMUNITY): Payer: Self-pay | Admitting: Cardiology

## 2017-10-29 VITALS — BP 150/79 | HR 77 | Wt 223.8 lb

## 2017-10-29 DIAGNOSIS — Z803 Family history of malignant neoplasm of breast: Secondary | ICD-10-CM | POA: Insufficient documentation

## 2017-10-29 DIAGNOSIS — C50912 Malignant neoplasm of unspecified site of left female breast: Secondary | ICD-10-CM | POA: Insufficient documentation

## 2017-10-29 DIAGNOSIS — C50919 Malignant neoplasm of unspecified site of unspecified female breast: Secondary | ICD-10-CM | POA: Diagnosis present

## 2017-10-29 DIAGNOSIS — Z79899 Other long term (current) drug therapy: Secondary | ICD-10-CM | POA: Diagnosis not present

## 2017-10-29 DIAGNOSIS — Z170 Estrogen receptor positive status [ER+]: Secondary | ICD-10-CM | POA: Insufficient documentation

## 2017-10-29 DIAGNOSIS — Z9221 Personal history of antineoplastic chemotherapy: Secondary | ICD-10-CM | POA: Diagnosis not present

## 2017-10-29 DIAGNOSIS — I10 Essential (primary) hypertension: Secondary | ICD-10-CM | POA: Insufficient documentation

## 2017-10-29 DIAGNOSIS — Z79811 Long term (current) use of aromatase inhibitors: Secondary | ICD-10-CM | POA: Diagnosis not present

## 2017-10-29 DIAGNOSIS — C50412 Malignant neoplasm of upper-outer quadrant of left female breast: Secondary | ICD-10-CM | POA: Diagnosis not present

## 2017-10-29 DIAGNOSIS — Z8679 Personal history of other diseases of the circulatory system: Secondary | ICD-10-CM | POA: Insufficient documentation

## 2017-10-29 DIAGNOSIS — M797 Fibromyalgia: Secondary | ICD-10-CM | POA: Diagnosis not present

## 2017-10-29 DIAGNOSIS — Z87891 Personal history of nicotine dependence: Secondary | ICD-10-CM | POA: Insufficient documentation

## 2017-10-29 NOTE — Progress Notes (Signed)
  Echocardiogram 2D Echocardiogram has been performed.  Jennette Dubin 10/29/2017, 3:09 PM

## 2017-10-30 NOTE — Progress Notes (Signed)
CARDIO-ONCOLOGY CLINIC NOTE  Oncology: Dr. Lindi Adie  Stacy [name] is a 57 yo with history of mitral valve repair in childhood, HTN, and breast cancer presents for cardio-oncology appointment.  Patient had mitral valve repair for mitral regurgitation at age 61, ?cleft mitral valve.  Recent echoes have appeared to show a normal mitral valve.  She was diagnosed with breast cancer in 12/17.  ER+/PR+/HER2+.  She had left lumpectomy.  Chemotherapy was started in 1/18 with taxol/carboplatin/Herceptin/Perjeta.  She received 6 cyles up front and now receiving 1 year of Herceptin/Perjeta.   Tolerating Herceptin and Perjeta well. No chest pain or exertional dyspnea.  BP mildly elevated in office today.   PMH: 1. Depression.  2. HTN 3. Fibromyalgia 4. S/p mitral valve repair 1971: Patient was 57 yrs old.  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  5. Breast cancer: Diagnosis in 12/17.  ER+/PR+/HER2+.  She had left lumpectomy.  Chemotherapy was started in 1/18 with taxol/carboplatin/Herceptin/Perjeta.  She will get 6 cyles of this then complete 1 year of Herceptin/Perjeta.  - Echo (12/17): EF 55-60%, moderate LVH, GLS -17%, stable mitral valve repair with no MR.  - Echo (3/18): EF 60-65%, GLS -19.2%, normal mitral valve with no MR, normal RV size and systolic function.  - Echo (7/18): EF 60-65%, GLS -19.5% LS - unable to be read. Normal MV. RV normal.  - Echo (11/18): EF 60-65%, GLS -37.8%, normal diastolic function, no MR.  - Echo (2/19): EF 58-85%, normal diastolic function, GLS -02.7%, mild AI  Social History   Socioeconomic History  . Marital status: Married    Spouse name: Not on file  . Number of children: 4  . Years of education: Not on file  . Highest education level: Not on file  Social Needs  . Financial resource strain: Not on file  . Food insecurity - worry: Not on file  . Food insecurity - inability: Not on file  . Transportation needs - medical: Not on file  . Transportation needs -  non-medical: Not on file  Occupational History  . Not on file  Tobacco Use  . Smoking status: Former Smoker    Packs/day: 0.25    Types: Cigarettes    Last attempt to quit: 01/13/2017    Years since quitting: 0.7  . Smokeless tobacco: Never Used  Substance and Sexual Activity  . Alcohol use: No    Alcohol/week: 0.0 oz  . Drug use: No  . Sexual activity: Yes    Birth control/protection: Post-menopausal  Other Topics Concern  . Not on file  Social History Narrative  . Not on file   Family History  Adopted: Yes  Problem Relation Age of Onset  . Breast cancer Brother 42       Maternal half-brother. Currently 36.  Marland Kitchen BRCA 1/2 Neg Hx    ROS: All systems reviewed and negative except as per HPI.   Current Outpatient Medications  Medication Sig Dispense Refill  . hydrochlorothiazide (HYDRODIURIL) 25 MG tablet Take 1 tablet (25 mg total) by mouth daily. MUST SCHEDULE ANNUAL EXAM 90 tablet 3  . ibuprofen (ADVIL,MOTRIN) 200 MG tablet Take 400 mg by mouth every 4 (four) hours as needed for headache, mild pain or moderate pain.     Marland Kitchen letrozole (FEMARA) 2.5 MG tablet Take 1 tablet (2.5 mg total) by mouth daily. 90 tablet 3  . oxyCODONE (OXY IR/ROXICODONE) 5 MG immediate release tablet Take 1-2 tablets (5-10 mg total) by mouth every 6 (six) hours as needed  for severe pain. 10 tablet 0  . pyridoxine (B-6) 100 MG tablet Take 100 mg by mouth daily.     No current facility-administered medications for this encounter.    BP (!) 150/79   Pulse 77   Wt 223 lb 12.8 oz (101.5 kg)   LMP 11/07/2016   SpO2 99%   BMI 38.42 kg/m  General: NAD Neck: No JVD, no thyromegaly or thyroid nodule.  Lungs: Clear to auscultation bilaterally with normal respiratory effort. CV: Nondisplaced PMI.  Heart regular S1/S2, no S3/S4, no murmur.  No peripheral edema.  No carotid bruit.  Normal pedal pulses.  Abdomen: Soft, nontender, no hepatosplenomegaly, no distention.  Skin: Intact without lesions or rashes.    Neurologic: Alert and oriented x 3.  Psych: Normal affect. Extremities: No clubbing or cyanosis.  HEENT: Normal.    Assessment/Plan: 1. Breast cancer:  She has completed Herceptin. I reviewed today's echo, LV EF and strain patterns normal.     2. HTN: BP mildly elevated but she does not want an additional antihypertensive. She is going to try to lose weight.  3. S/p surgical mitral valve repair: This was done in childhood for mitral regurgitation.  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  Echo today showed no mitral regurgitation. There was no obvious MV abnormality.   She will followup prn.   Loralie Champagne MD 10/30/2017

## 2017-11-04 ENCOUNTER — Telehealth: Payer: Self-pay | Admitting: Hematology and Oncology

## 2017-11-04 NOTE — Telephone Encounter (Signed)
11/04/17 @ 2:38 pm left vm for patient informing her that her FMLA was successfully faxed to [employer] @ [phone] @ 2:15 pm.  Also let her know her copy will be at front desk reception area for her to pick up.

## 2017-11-14 ENCOUNTER — Encounter: Payer: Self-pay | Admitting: Internal Medicine

## 2017-11-14 ENCOUNTER — Ambulatory Visit: Payer: Self-pay | Admitting: Internal Medicine

## 2017-11-14 ENCOUNTER — Ambulatory Visit: Payer: BLUE CROSS/BLUE SHIELD | Admitting: Internal Medicine

## 2017-11-14 VITALS — BP 132/78 | HR 90 | Temp 98.1°F | Wt 224.0 lb

## 2017-11-14 DIAGNOSIS — M792 Neuralgia and neuritis, unspecified: Secondary | ICD-10-CM | POA: Diagnosis not present

## 2017-11-14 DIAGNOSIS — F329 Major depressive disorder, single episode, unspecified: Secondary | ICD-10-CM

## 2017-11-14 DIAGNOSIS — F419 Anxiety disorder, unspecified: Secondary | ICD-10-CM

## 2017-11-14 DIAGNOSIS — F32A Depression, unspecified: Secondary | ICD-10-CM

## 2017-11-14 MED ORDER — ALPRAZOLAM 0.25 MG PO TABS: 0.2500 mg | ORAL_TABLET | Freq: Every day | ORAL | 0 refills | Status: DC | PRN

## 2017-11-14 MED ORDER — VENLAFAXINE HCL ER 37.5 MG PO CP24: 37.5000 mg | ORAL_CAPSULE | Freq: Every day | ORAL | 2 refills | Status: DC

## 2017-11-14 NOTE — Progress Notes (Signed)
Subjective:    Patient ID: Stacy [name], female    DOB: [birth year], 57 y.o.   MRN: [id number]  HPI  Pt presents to the clinic today with c/o worsening anxiety and depression. She noticed this about 2-3 weeks ago. She is tired all the time, doesn't want to get out bed, doesn't want to leave the house, cries all the time. She has had a recent increase in generalized stress and uncontrolled neuropathic pain (chemotherapy induced). She denies SI/HI. She does have a supportive family and friends. She would like to be started on medication for anxiety and depression.  Review of Systems      Past Medical History:  Diagnosis Date  . Anxiety   . Arthritis   . Complication of anesthesia    DIFFICULTY WAKING UP , LAST SURGERY NECK 2012 WAS FINE PER PT HERE AT [facility]  . Depression   . Fibromyalgia   . Genetic testing 12/21/2016   Genetic testing reported out on 12/20/2016 through [facility]'s 46-gene Common Hereditary Cancers panel found no deleterious mutations. The following 46 genes were analyzed: APC, ATM, AXIN2, BARD1, BMPR1A, BRCA1, BRCA2, BRIP1, CDH1, CDKN2A, CHEK2, CTNNA1, DICER1, EPCAM, GREM1, KIT, MEN1, MLH1, MSH2, MSH3, MSH6, MUTYH, NBN, NF1, NTHL1, PALB2, PDGFRA, PMS2, POLD1, POLE, PTEN, RAD50, RAD51C, RAD51D, SDH  . Headache    HX MIGRAINES    . Heart murmur   . History of radiation therapy 02/14/17-04/03/2017   left breast 60.4 Gy: 50.4 Gy delivered in 28 fractions and a boost of 10 Gy delivered in 5 fractions.  . Hypertension   . Malignant neoplasm of upper-outer quadrant of left female breast ([place]) 08/23/2016  . MVP (mitral valve prolapse)    AS INFANT  . Neck pain   . Personal history of chemotherapy   . Personal history of radiation therapy   . Vision abnormalities     Current Outpatient Medications  Medication Sig Dispense Refill  . hydrochlorothiazide (HYDRODIURIL) 25 MG tablet Take 1 tablet (25 mg total) by mouth daily. MUST SCHEDULE ANNUAL EXAM 90 tablet 3  . ibuprofen  (ADVIL,MOTRIN) 200 MG tablet Take 400 mg by mouth every 4 (four) hours as needed for headache, mild pain or moderate pain.     Marland Kitchen letrozole (FEMARA) 2.5 MG tablet Take 1 tablet (2.5 mg total) by mouth daily. 90 tablet 3  . oxyCODONE (OXY IR/ROXICODONE) 5 MG immediate release tablet Take 1-2 tablets (5-10 mg total) by mouth every 6 (six) hours as needed for severe pain. 10 tablet 0  . pyridoxine (B-6) 100 MG tablet Take 100 mg by mouth daily.    Marland Kitchen ALPRAZolam (XANAX) 0.25 MG tablet Take 1 tablet (0.25 mg total) by mouth daily as needed for anxiety. 20 tablet 0  . venlafaxine XR (EFFEXOR-XR) 37.5 MG 24 hr capsule Take 1 capsule (37.5 mg total) by mouth daily with breakfast. 30 capsule 2   No current facility-administered medications for this visit.     Allergies  Allergen Reactions  . Gabapentin Swelling    "makes me feel detached"  . Naproxen Palpitations  . Neulasta [Pegfilgrastim] Itching and Other (See Comments)    Patient stated,"reddish skin tone, body on fire, tingly tongue."  . Erythromycin Nausea And Vomiting  . Tramadol Hcl Other (See Comments)    Headaches    Family History  Adopted: Yes  Problem Relation Age of Onset  . Breast cancer Brother 68       Maternal half-brother. Currently 44.  Marland Kitchen BRCA 1/2 Neg Hx  Social History   Socioeconomic History  . Marital status: Married    Spouse name: Not on file  . Number of children: 4  . Years of education: Not on file  . Highest education level: Not on file  Social Needs  . Financial resource strain: Not on file  . Food insecurity - worry: Not on file  . Food insecurity - inability: Not on file  . Transportation needs - medical: Not on file  . Transportation needs - non-medical: Not on file  Occupational History  . Not on file  Tobacco Use  . Smoking status: Former Smoker    Packs/day: 0.25    Types: Cigarettes    Last attempt to quit: 01/13/2017    Years since quitting: 0.8  . Smokeless tobacco: Never Used    Substance and Sexual Activity  . Alcohol use: No    Alcohol/week: 0.0 oz  . Drug use: No  . Sexual activity: Yes    Birth control/protection: Post-menopausal  Other Topics Concern  . Not on file  Social History Narrative  . Not on file     Constitutional: Denies fever, malaise, fatigue, headache or abrupt weight changes.  Psych: Pt reports anxiety and depression. Denies SI/HI.  No other specific complaints in a complete review of systems (except as listed in HPI above).  Objective:   Physical Exam   BP 132/78   Pulse 90   Temp 98.1 F (36.7 C) (Oral)   Wt 224 lb (101.6 kg)   LMP 11/07/2016   SpO2 98%   BMI 38.45 kg/m  Wt Readings from Last 3 Encounters:  11/14/17 224 lb (101.6 kg)  10/29/17 223 lb 12.8 oz (101.5 kg)  10/23/17 221 lb (100.2 kg)    General: Appears her stated age, in NAD. Neurological: Alert and oriented.  Psychiatric: Tearful throughout the interview. Judgment and thought content normal.     BMET    Component Value Date/Time   NA 136 10/18/2017 1538   NA 138 09/11/2017 0902   K 3.7 10/18/2017 1538   K 3.5 09/11/2017 0902   CL 100 (L) 10/18/2017 1538   CO2 25 10/18/2017 1538   CO2 25 09/11/2017 0902   GLUCOSE 99 10/18/2017 1538   GLUCOSE 98 09/11/2017 0902   BUN 14 10/18/2017 1538   BUN 18.2 09/11/2017 0902   CREATININE 1.22 (H) 10/18/2017 1538   CREATININE 1.0 09/11/2017 0902   CALCIUM 9.4 10/18/2017 1538   CALCIUM 9.4 09/11/2017 0902   GFRNONAA 49 (L) 10/18/2017 1538   GFRAA 57 (L) 10/18/2017 1538    Lipid Panel     Component Value Date/Time   CHOL 172 04/20/2016 1417   TRIG 127.0 04/20/2016 1417   HDL 59.10 04/20/2016 1417   CHOLHDL 3 04/20/2016 1417   VLDL 25.4 04/20/2016 1417   LDLCALC 88 04/20/2016 1417    CBC    Component Value Date/Time   WBC 10.3 10/18/2017 1538   RBC 4.54 10/18/2017 1538   HGB 13.8 10/18/2017 1538   HGB 13.0 09/11/2017 0902   HCT 40.1 10/18/2017 1538   HCT 38.6 09/11/2017 0902   PLT 276  10/18/2017 1538   PLT 263 09/11/2017 0902   MCV 88.3 10/18/2017 1538   MCV 88.7 09/11/2017 0902   MCH 30.4 10/18/2017 1538   MCHC 34.4 10/18/2017 1538   RDW 13.9 10/18/2017 1538   RDW 13.4 09/11/2017 0902   LYMPHSABS 1.4 10/02/2017 1026   LYMPHSABS 1.4 09/11/2017 0902   MONOABS 0.4 10/02/2017 1026  MONOABS 0.8 09/11/2017 0902   EOSABS 0.2 10/02/2017 1026   EOSABS 0.2 09/11/2017 0902   BASOSABS 0.0 10/02/2017 1026   BASOSABS 0.0 09/11/2017 0902    Hgb A1C Lab Results  Component Value Date   HGBA1C 5.6 04/20/2016           Assessment & Plan:   Neuropathic Pain:  Referral placed to pain clinic

## 2017-11-15 ENCOUNTER — Encounter: Payer: Self-pay | Admitting: Internal Medicine

## 2017-11-15 NOTE — Patient Instructions (Signed)
Depression Screening Depression screening is a tool that your health care provider can use to learn if you have symptoms of depression. Depression is a common condition with many symptoms that are also often found in other conditions. Depression is treatable, but it must first be diagnosed. You may not know that certain feelings, thoughts, and behaviors that you are having can be symptoms of depression. Taking a depression screening test can help you and your health care provider decide if you need more assessment, or if you should be referred to a mental health care provider. What are the screening tests?  You may have a physical exam to see if another condition is affecting your mental health. You may have a blood or urine sample taken during the physical exam.  You may be interviewed using a screening tool that was developed from research, such as one of these: ? Patient Health Questionnaire (PHQ). This is a set of either 2 or 9 questions. A health care provider who has been trained to score this screening test uses a guide to assess if your symptoms suggest that you may have depression. ? [name] Depression Rating Scale (HAM-D). This is a set of either 17 or 24 questions. You may be asked to take it again during or after your treatment, to see if your depression has gotten better. ? Beck Depression Inventory (BDI). This is a set of 21 multiple choice questions. Your health care provider scores your answers to assess:  Your level of depression, ranging from mild to severe.  Your response to treatment.  Your health care provider may talk with you about your daily activities, such as eating, sleeping, work, and recreation, and ask if you have had any changes in activity.  Your health care provider may ask you to see a mental health specialist, such as a psychiatrist or psychologist, for more evaluation. Who should be screened for depression?  All adults, including adults with a family history  of a mental health disorder.  Adolescents who are 12-18 years old.  People who are recovering from a myocardial infarction (MI).  Pregnant women, or women who have given birth.  People who have a long-term (chronic) illness.  Anyone who has been diagnosed with another type of a mental health disorder.  Anyone who has symptoms that could show depression. What do my results mean? Your health care provider will review the results of your depression screening, physical exam, and lab tests. Positive screens suggest that you may have depression. Screening is the first step in getting the care that you may need. It is up to you to get your screening results. Ask your health care provider, or the department that is doing your screening tests, when your results will be ready. Talk with your health care provider about your results and diagnosis. A diagnosis of depression is made using the Diagnostic and Statistical Manual of Mental Disorders (DSM-V). This is a book that lists the number and type of symptoms that must be present for a health care provider to give a specific diagnosis.  Your health care provider may work with you to treat your symptoms of depression, or your health care provider may help you find a mental health provider who can assess, diagnose, and treat your depression. Get help right away if:  You have thoughts about hurting yourself or others. If you ever feel like you may hurt yourself or others, or have thoughts about taking your own life, get help right away. You can   go to your nearest emergency department or call:  Your local emergency services (911 in the [place]  A suicide crisis helpline, such as the National Suicide Prevention Lifeline at 1-800-273-8255. This is open 24 hours a day.  Summary  Depression screening is the first step in getting the help that you may need.  If your screening test shows symptoms of depression (is positive), your health care provider may ask  you to see a mental health provider.  Anyone who is age 12 or older should be screened for depression. This information is not intended to replace advice given to you by your health care provider. Make sure you discuss any questions you have with your health care provider. Document Released: 01/18/2017 Document Revised: 01/18/2017 Document Reviewed: 01/18/2017 Elsevier Interactive Patient Education  2018 Elsevier Inc.  

## 2017-11-15 NOTE — Assessment & Plan Note (Signed)
Deteriorated Support offered today She declines referral for therapy eRx for Effexor 37.5 mg eRx for Xanax 0.25 mg daily prn- sedation/addiction caution given  Update me in 3 weeks and let me know how you are doing

## 2017-12-23 ENCOUNTER — Encounter: Payer: Self-pay | Admitting: Physical Medicine & Rehabilitation

## 2017-12-30 ENCOUNTER — Ambulatory Visit: Payer: Self-pay | Admitting: *Deleted

## 2017-12-30 NOTE — Telephone Encounter (Signed)
2 months ago- patient started burning and itching on arms and hands. Patient has sore on arm and belly. Clothes are not fitting- patient thinks she is retaining fluid. Patient is cramping in hands and feet. Patient sits at work- she has muscle tightness in calves of legs. Patient had chemo and radiation. Patient has an appointment next week with R Baity- made acute appointment tomorrow to evaluate itching on arms and the sores she is creating by scratching.  Reason for Disposition . [1] MODERATE-SEVERE local itching (i.e., interferes with work, school, activities) AND [2] not improved after 24 hours of hydrocortisone cream  Answer Assessment - Initial Assessment Questions 1. DESCRIPTION: "Describe the itching you are having." "Where is it located?"     Arms- both- more prominent-on Right and on the breast 2. SEVERITY: "How bad is it?"    - MILD - doesn't interfere with normal activities   - MODERATE-SEVERE: interferes with work, school, sleep, or other activities      Moderate to severe 3. SCRATCHING: "Are there any scratch marks? Bleeding?"     Yes- has sores 4. ONSET: "When did the itching begin?"      2-3 months 5. CAUSE: "What do you think is causing the itching?"      unknown 6. OTHER SYMPTOMS: "Do you have any other symptoms?"      Abdominal growth, cramping in hands/feet, dizzy spells, muscle spasms 7. PREGNANCY: "Is there any chance you are pregnant?" "When was your last menstrual period?"     n/a  Protocols used: ITCHING - LOCALIZED-A-AH

## 2017-12-30 NOTE — Telephone Encounter (Signed)
Valli Glance RN also noted; Appointment to address itching with Dr Damita Dunnings 12/31/17 and pt has 30' appt with Avie Echevaria NP for multiple issues including dizziness on 01/09/18. FYI to Dr Damita Dunnings and Avie Echevaria NP.

## 2017-12-31 ENCOUNTER — Ambulatory Visit: Payer: BLUE CROSS/BLUE SHIELD | Admitting: Family Medicine

## 2017-12-31 ENCOUNTER — Encounter: Payer: Self-pay | Admitting: Family Medicine

## 2017-12-31 VITALS — BP 112/62 | HR 87 | Temp 97.6°F | Wt 226.2 lb

## 2017-12-31 DIAGNOSIS — R5383 Other fatigue: Secondary | ICD-10-CM

## 2017-12-31 DIAGNOSIS — R21 Rash and other nonspecific skin eruption: Secondary | ICD-10-CM | POA: Diagnosis not present

## 2017-12-31 LAB — CBC WITH DIFFERENTIAL/PLATELET
BASOS PCT: 0.9 % (ref 0.0–3.0)
Basophils Absolute: 0.1 10*3/uL (ref 0.0–0.1)
EOS PCT: 0.9 % (ref 0.0–5.0)
Eosinophils Absolute: 0.1 10*3/uL (ref 0.0–0.7)
HEMATOCRIT: 42.8 % (ref 36.0–46.0)
HEMOGLOBIN: 14.5 g/dL (ref 12.0–15.0)
LYMPHS PCT: 17.1 % (ref 12.0–46.0)
Lymphs Abs: 1.9 10*3/uL (ref 0.7–4.0)
MCHC: 34 g/dL (ref 30.0–36.0)
MCV: 88 fl (ref 78.0–100.0)
Monocytes Absolute: 0.6 10*3/uL (ref 0.1–1.0)
Monocytes Relative: 5.8 % (ref 3.0–12.0)
Neutro Abs: 8.2 10*3/uL — ABNORMAL HIGH (ref 1.4–7.7)
Neutrophils Relative %: 75.3 % (ref 43.0–77.0)
Platelets: 298 10*3/uL (ref 150.0–400.0)
RBC: 4.87 Mil/uL (ref 3.87–5.11)
RDW: 14.4 % (ref 11.5–15.5)
WBC: 11 10*3/uL — ABNORMAL HIGH (ref 4.0–10.5)

## 2017-12-31 LAB — COMPREHENSIVE METABOLIC PANEL
ALT: 16 U/L (ref 0–35)
AST: 19 U/L (ref 0–37)
Albumin: 4.4 g/dL (ref 3.5–5.2)
Alkaline Phosphatase: 67 U/L (ref 39–117)
BUN: 16 mg/dL (ref 6–23)
CALCIUM: 9.9 mg/dL (ref 8.4–10.5)
CHLORIDE: 99 meq/L (ref 96–112)
CO2: 26 meq/L (ref 19–32)
Creatinine, Ser: 0.97 mg/dL (ref 0.40–1.20)
GFR: 63.53 mL/min (ref >60.00)
Glucose, Bld: 97 mg/dL (ref 70–99)
POTASSIUM: 4.8 meq/L (ref 3.5–5.1)
SODIUM: 132 meq/L — AB (ref 135–145)
Total Bilirubin: 0.4 mg/dL (ref 0.2–1.2)
Total Protein: 7.6 g/dL (ref 6.0–8.3)

## 2017-12-31 LAB — TSH: TSH: 1.79 u[IU]/mL (ref 0.35–4.50)

## 2017-12-31 MED ORDER — TRIAMCINOLONE ACETONIDE 0.1 % EX CREA: 1.0000 "application " | TOPICAL_CREAM | Freq: Two times a day (BID) | CUTANEOUS | 0 refills | Status: DC

## 2017-12-31 NOTE — Patient Instructions (Signed)
Use TAC cream as needed for itching.  Go to the lab on the way out.  We'll contact you with your lab report. I'll update Baity.  Take care.  Glad to see you.

## 2017-12-31 NOTE — Telephone Encounter (Signed)
Will see at OV.  Thanks.  

## 2017-12-31 NOTE — Progress Notes (Signed)
She is done with chemo and radiation from breast cancer.  She is off treatment currently, has been off for months.   She is itching to the point of scratching and excoriating her skin.  Has been going on for 2-3 months.  She has tried PO benadryl but it makes her drowsy.  No wheeze.  No fevers, no cough.    She is moody and fatigued.  She is nauseated.  Those sx have been going on for a few weeks.    She has some cramping in her hands and feet for 6 months, bilaterally.    She has BLE edema at the end of the day, better by the next AM.    She felt "weird" on effexor w/o relief of med.  She couldn't tolerate xanax prev.    She doesn't tolerate oral prednisone.  She was sedated with antihistamines.  ROS: Per HPI unless specifically indicated in ROS section   Meds, vitals, and allergies reviewed.   nad but tearful initially, regains composure Mmm OP wnl Neck supple, no LA rrr ctab abd soft Excoriated B upper arms on the extensor surfaces w/o vesicles.   No BLE edema.

## 2018-01-01 DIAGNOSIS — R21 Rash and other nonspecific skin eruption: Secondary | ICD-10-CM | POA: Insufficient documentation

## 2018-01-01 NOTE — Assessment & Plan Note (Signed)
Rash, with itching, unclear source.  Reasonable to try triamcinolone cream.  She does not tolerate oral prednisone but she can likely tolerate triamcinolone cream.  Similar with using topical Benadryl, she can try to use that even though oral Benadryl makes her sedated.  Still okay for outpatient follow-up.  Given the fatigue and episodic lower extremity edema she has, reasonable to check routine labs today.  She has follow-up with PCP pending.  Routed to PCP as FYI.  Still okay for outpatient follow-up.

## 2018-01-09 ENCOUNTER — Ambulatory Visit: Payer: BLUE CROSS/BLUE SHIELD | Admitting: Internal Medicine

## 2018-01-09 DIAGNOSIS — Z2089 Contact with and (suspected) exposure to other communicable diseases: Secondary | ICD-10-CM

## 2018-01-16 ENCOUNTER — Encounter: Payer: BLUE CROSS/BLUE SHIELD | Attending: Physical Medicine & Rehabilitation

## 2018-01-16 ENCOUNTER — Encounter: Payer: Self-pay | Admitting: Physical Medicine & Rehabilitation

## 2018-01-16 ENCOUNTER — Ambulatory Visit: Payer: BLUE CROSS/BLUE SHIELD | Admitting: Physical Medicine & Rehabilitation

## 2018-01-16 VITALS — BP 167/78 | HR 82 | Resp 14 | Ht 64.0 in | Wt 227.0 lb

## 2018-01-16 DIAGNOSIS — T451X5A Adverse effect of antineoplastic and immunosuppressive drugs, initial encounter: Secondary | ICD-10-CM | POA: Insufficient documentation

## 2018-01-16 DIAGNOSIS — X58XXXA Exposure to other specified factors, initial encounter: Secondary | ICD-10-CM | POA: Diagnosis not present

## 2018-01-16 DIAGNOSIS — F4321 Adjustment disorder with depressed mood: Secondary | ICD-10-CM

## 2018-01-16 DIAGNOSIS — G620 Drug-induced polyneuropathy: Secondary | ICD-10-CM | POA: Diagnosis not present

## 2018-01-16 DIAGNOSIS — M722 Plantar fascial fibromatosis: Secondary | ICD-10-CM | POA: Diagnosis not present

## 2018-01-16 NOTE — Patient Instructions (Signed)
Capsaicin topical cream, lotion, solution What is this medicine? CAPSAICIN (cap SAY sin) is a pain reliever. It is used to treat pain in muscles or joints. This medicine may be used for other purposes; ask your health care provider or pharmacist if you have questions. COMMON BRAND NAME(S): Arthricare for Women, Capzasin-HP, Capzasin-P, Castiva Warming, Zostrix What should I tell my health care provider before I take this medicine? They need to know if you have any of these conditions: -broken or irritated skin -an unusual or allergic reaction to capsaicin, hot peppers, other medicines, foods, dyes, or preservatives -pregnant or trying to get pregnant -breast-feeding How should I use this medicine? Use this medicine on the skin. Do not take by mouth. Follow the directions on the package or prescription label. Rub into painful area until there is little or no visible medicine left on the skin surface. Wash hands with soap and water after applying. If you are using this medicine for pain in the hands, do not wash your hands for at least 30 minutes after using this medicine. After using this medicine do not use a bandage, a heating pad, or expose the affected area to direct sun. Use your medicine at regular intervals. Do not use your medicine more often than directed. Talk to your pediatrician regarding the use of this medicine in children. Special care may be needed. Overdosage: If you think you have taken too much of this medicine contact a poison control center or emergency room at once. NOTE: This medicine is only for you. Do not share this medicine with others. What if I miss a dose? If you miss a dose, use it as soon as you can. If it is almost time for your next dose, use only that dose. Do not use double or extra doses. What may interact with this medicine? Interactions are not expected. Do not use any other skin products on the affected area without asking your doctor or health care  professional. This list may not describe all possible interactions. Give your health care provider a list of all the medicines, herbs, non-prescription drugs, or dietary supplements you use. Also tell them if you smoke, drink alcohol, or use illegal drugs. Some items may interact with your medicine. What should I watch for while using this medicine? Tell your doctor or healthcare professional if your symptoms do not start to get better or if they get worse. What side effects may I notice from receiving this medicine? Side effects that you should report to your doctor or health care professional as soon as possible: -allergic reactions like skin rash, itching or hives, swelling of the face, lips, or tongue -burning pain, redness that does not go away -cough -skin sores or thinning Side effects that usually do not require medical attention (report to your doctor or health care professional if they continue or are bothersome): -mild stinging or warmth where used This list may not describe all possible side effects. Call your doctor for medical advice about side effects. You may report side effects to FDA at 1-800-FDA-1088. Where should I keep my medicine? Keep out of the reach of young children. Store at room temperature, between 15 and 30 degrees C (59 and 86 degrees F). Do not freeze. Protect from light. Throw away any unused medicine after the expiration date. NOTE: This sheet is a summary. It may not cover all possible information. If you have questions about this medicine, talk to your doctor, pharmacist, or health care provider.  2018  Elsevier/Gold Standard (2011-08-20 16:38:28)   Plantar Fasciitis Rehab Ask your health care provider which exercises are safe for you. Do exercises exactly as told by your health care provider and adjust them as directed. It is normal to feel mild stretching, pulling, tightness, or discomfort as you do these exercises, but you should stop right away if you feel  sudden pain or your pain gets worse. Do not begin these exercises until told by your health care provider. Stretching and range of motion exercises These exercises warm up your muscles and joints and improve the movement and flexibility of your foot. These exercises also help to relieve pain. Exercise A: Plantar fascia stretch  1. Sit with your left / right leg crossed over your opposite knee. 2. Hold your heel with one hand with that thumb near your arch. With your other hand, hold your toes and gently pull them back toward the top of your foot. You should feel a stretch on the bottom of your toes or your foot or both. 3. Hold this stretch for__________ seconds. 4. Slowly release your toes and return to the starting position. Repeat __________ times. Complete this exercise __________ times a day. Exercise B: Gastroc, standing  1. Stand with your hands against a wall. 2. Extend your left / right leg behind you, and bend your front knee slightly. 3. Keeping your heels on the floor and keeping your back knee straight, shift your weight toward the wall without arching your back. You should feel a gentle stretch in your left / right calf. 4. Hold this position for __________ seconds. Repeat __________ times. Complete this exercise __________ times a day. Exercise C: Soleus, standing 1. Stand with your hands against a wall. 2. Extend your left / right leg behind you, and bend your front knee slightly. 3. Keeping your heels on the floor, bend your back knee and slightly shift your weight over the back leg. You should feel a gentle stretch deep in your calf. 4. Hold this position for __________ seconds. Repeat __________ times. Complete this exercise __________ times a day. Exercise D: Gastrocsoleus, standing 1. Stand with the ball of your left / right foot on a step. The ball of your foot is on the walking surface, right under your toes. 2. Keep your other foot firmly on the same step. 3. Hold  onto the wall or a railing for balance. 4. Slowly lift your other foot, allowing your body weight to press your heel down over the edge of the step. You should feel a stretch in your left / right calf. 5. Hold this position for __________ seconds. 6. Return both feet to the step. 7. Repeat this exercise with a slight bend in your left / right knee. Repeat __________ times with your left / right knee straight and __________ times with your left / right knee bent. Complete this exercise __________ times a day. Balance exercise This exercise builds your balance and strength control of your arch to help take pressure off your plantar fascia. Exercise E: Single leg stand 1. Without shoes, stand near a railing or in a doorway. You may hold onto the railing or door frame as needed. 2. Stand on your left / right foot. Keep your big toe down on the floor and try to keep your arch lifted. Do not let your foot roll inward. 3. Hold this position for __________ seconds. 4. If this exercise is too easy, you can try it with your eyes closed or while standing  on a pillow. Repeat __________ times. Complete this exercise __________ times a day. This information is not intended to replace advice given to you by your health care provider. Make sure you discuss any questions you have with your health care provider. Document Released: 09/03/2005 Document Revised: 05/08/2016 Document Reviewed: 07/18/2015 Elsevier Interactive Patient Education  2018 [employer].

## 2018-01-16 NOTE — Progress Notes (Signed)
Subjective:    Patient ID: Stacy [name], female    DOB: [birth year], 57 y.o.   MRN: [id number]  HPI CC:  Bilateral foot pain    57 yo female with breast ca on Left  Onset Aug 2018, finished taxol and carboplatin in May 2018 Patient continues to follow-up with oncology but is no longer receiving active treatment. Patient has followed up with cardiology due to concerns about cardiomyopathy related to Herceptin.  Echocardiogram 10/29/2017 reviewed by Dr. Benjamine Mola.  Normal ejection fraction no evidence of diastolic dysfunction  Pain on bilateral instep into the heel.  Patient denies history of foot injuries or foot surgery in the past.  Pain increased after she has been an active and gets up to walk Hand numbness in hands start March or April 2018, worse on left affecting L 3rd,4th,5th dig numbness  Tingling starting in June 1. Pain started in August No EMG study  Chemotherapy was started in 1/18 with taxol/carboplatin,   Herceptin/Perjeta finished 09/2017  Hx moderate low back pain with standing does not feel connected to back pain  No hx DM  Gabapentin and Lyricamakes her feel "homicidal" "didn't do well" with Cymbalta Tramadol bad headaches Percocet "loopy", Cannot take at work  Takes ibuprofen with some relief Patient initially states she is not depressed but then after reviewing the results of PHQ 9 she is okay with psychology referral Pain Inventory Average Pain 7 Pain Right Now 9 My pain is constant and aching  In the last 24 hours, has pain interfered with the following? General activity 10 Relation with others 10 Enjoyment of life 10 What TIME of day is your pain at its worst? all Sleep (in general) Poor  Pain is worse with: walking, sitting and standing Pain improves with: therapy/exercise Relief from Meds: 0  Mobility walk without assistance how many minutes can you walk? 5-10 ability to climb steps?  no do you drive?  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 Breast cancer Brother 35       Maternal half-brother. Currently 84.  Marland Kitchen BRCA 1/2 Neg Hx    Social History   Socioeconomic History  . Marital status: Married    Spouse name: Not on file  . Number of children: 4  . Years of education: Not on file  . Highest education level: Not on file  Occupational History  . Not on file  Social Needs  . Financial resource strain: Not on file  . Food insecurity:    Worry: Not on file    Inability: Not on file  . Transportation needs:    Medical: Not on file    Non-medical: Not on file  Tobacco Use  . Smoking status: Former Smoker    Packs/day: 0.25    Types: Cigarettes    Last attempt to quit: 01/13/2017    Years since quitting: 1.0  . Smokeless tobacco: Never Used  Substance and Sexual Activity  . Alcohol use: No    Alcohol/week: 0.0 oz  . Drug use: No  . Sexual activity: Yes    Birth control/protection: Post-menopausal  Lifestyle  . Physical activity:    Days per week: Not on  file    Minutes per session: Not on file  . Stress: Not on file  Relationships  . Social connections:    Talks on phone: Not on file    Gets together: Not on file    Attends religious service: Not on file    Active member of club or organization: Not on file    Attends meetings of clubs or organizations: Not on file    Relationship status: Not on file  Other Topics Concern  . Not on file  Social History Narrative  . Not on file   Past Surgical History:  Procedure Laterality Date  . cervical fusiion  2012  . JOINT REPLACEMENT    . MITRAL VALVE REPAIR     MVP 1970  . PORT-A-CATH REMOVAL N/A 10/23/2017    Procedure: REMOVAL PORT-A-CATH;  Surgeon: [name], MD;  Location: [facility];  Service: General;  Laterality: N/A;  . PORTACATH PLACEMENT Right 09/06/2016   Procedure: INSERTION PORT-A-CATH;  Surgeon: [name], MD;  Location: [place] [facility];  Service: General;  Laterality: Right;  . RADIOACTIVE SEED GUIDED PARTIAL MASTECTOMY WITH AXILLARY SENTINEL LYMPH NODE BIOPSY Left 09/06/2016   Procedure: RADIOACTIVE SEED GUIDED LEFT BREAST LUMPECTOMY  WITH SENTINEL LYMPH NODE BIOPSY;  Surgeon: [name], MD;  Location: [facility];  Service: General;  Laterality: Left;, Per patient just "lumpectomy"  . ROTATOR CUFF REPAIR Right 2012  . TOTAL HIP ARTHROPLASTY Right 09/14/2015   Procedure: RIGHT TOTAL HIP ARTHROPLASTY ANTERIOR APPROACH;  Surgeon: Leandrew Koyanagi, MD;  Location: [facility];  Service: Orthopedics;  Laterality: Right;  . TUBAL LIGATION  1988   Past Medical History:  Diagnosis Date  . Anxiety   . Arthritis   . Complication of anesthesia    DIFFICULTY WAKING UP , LAST SURGERY NECK 2012 WAS FINE PER PT HERE AT [facility]  . Depression   . Fibromyalgia   . Genetic testing 12/21/2016   Genetic testing reported out on 12/20/2016 through [facility] 46-gene Common Hereditary Cancers panel found no deleterious mutations. The following 46 genes were analyzed: APC, ATM, AXIN2, BARD1, BMPR1A, BRCA1, BRCA2, BRIP1, CDH1, CDKN2A, CHEK2, CTNNA1, DICER1, EPCAM, GREM1, KIT, MEN1, MLH1, MSH2, MSH3, MSH6, MUTYH, NBN, NF1, NTHL1, PALB2, PDGFRA, PMS2, POLD1, POLE, PTEN, RAD50, RAD51C, RAD51D, SDH  . Headache    HX MIGRAINES    . Heart murmur   . History of radiation therapy 02/14/17-04/03/2017   left breast 60.4 Gy: 50.4 Gy delivered in 28 fractions and a boost of 10 Gy delivered in 5 fractions.  . Hypertension   . Malignant neoplasm of upper-outer quadrant of left female breast ([place]) 08/23/2016  . MVP (mitral valve prolapse)    AS INFANT  . Neck pain   . Personal history of chemotherapy   .  Personal history of radiation therapy   . Vision abnormalities    BP (!) 167/78 (BP Location: Right Arm, Patient Position: Sitting, Cuff Size: Normal)   Pulse 82   Resp 14   Ht 5' 4" (1.626 m)   Wt 227 lb (103 kg)   LMP 11/07/2016   SpO2 93%   BMI 38.96 kg/m   Opioid Risk Score:   Fall Risk Score:  `1  Depression screen PHQ 2/9  Depression screen [facility] 2/9 01/16/2018 11/14/2017 09/06/2017 05/06/2017 01/31/2017 04/20/2016 06/27/2015  Decreased Interest 0 3 1 0 0 0 1  Down, Depressed, Hopeless _0 0 0 0 1  PHQ - 2 Score _1 0  0 0 2  Altered sleeping 3 2 0 - - - 1  Tired, decreased energy _0 - - - 1  Change in appetite 3 2 0 - - - 1  Feeling bad or failure about yourself  0 0 0 - - - 0  Trouble concentrating 3 0 0 - - - 1  Moving slowly or fidgety/restless 3 0 0 - - - 0  Suicidal thoughts 0 0 0 - - - 0  PHQ-9 Score _1 - - - 6  Difficult doing work/chores Very difficult Very difficult - - - - -  Some recent data might be hidden    Review of Systems  Constitutional: Positive for unexpected weight change.  HENT: Negative.   Eyes: Negative.   Gastrointestinal: Positive for nausea.  Endocrine: Negative.   Genitourinary:       Incontinence  Musculoskeletal: Positive for back pain and myalgias.  Skin: Negative.   Allergic/Immunologic: Negative.   Neurological: Positive for dizziness and numbness.       Tingling  Psychiatric/Behavioral: Positive for confusion. The patient is nervous/anxious.   All other systems reviewed and are negative.      Objective:   Physical Exam  Constitutional: She is oriented to person, place, and time. She appears well-developed and well-nourished.  HENT:  Head: Normocephalic and atraumatic.  Eyes: Pupils are equal, round, and reactive to light. EOM are normal.  Neck: Normal range of motion.  Cardiovascular: Normal rate, regular rhythm and intact distal pulses.  Murmur heard. Pulses:      Dorsalis pedis pulses are 2+ on the right side,  and 2+ on the left side.       Posterior tibial pulses are 2+ on the right side, and 2+ on the left side.  Pulmonary/Chest: Effort normal and breath sounds normal.  Abdominal: Soft. Bowel sounds are normal.  Musculoskeletal:       Right ankle: She exhibits normal range of motion and no swelling. No tenderness. Achilles tendon normal. Achilles tendon exhibits no pain and normal Thompson's test results.       Left ankle: She exhibits normal range of motion and no swelling. No tenderness. Achilles tendon normal. Achilles tendon exhibits normal Thompson's test results.       Right foot: There is normal range of motion and no deformity.       Left foot: There is normal range of motion and no deformity.  Feet:  Right Foot:  Skin Integrity: Negative for ulcer, blister, skin breakdown, erythema, callus or dry skin.  Left Foot:  Skin Integrity: Negative for ulcer, blister, skin breakdown, erythema, callus or dry skin.  Neurological: She is alert and oriented to person, place, and time. She has normal strength. She displays no atrophy. A sensory deficit is present.  Nursing note and vitals reviewed.    Reduced pp , LT Left C5-6-7-8. L2-3-4-5 S1 Intact proprio     Assessment & Plan:  #1 chemotherapy-induced neuropathy.  Atypical findings given that the left side has paresthesias in the right side does not however both sides are painful.  The patient has not had an EMG and will further evaluate with left upper extremity left lower extremity study.  If this is normal I would not pursue the right side if this is abnormal may need to do some conductions on the right side.  Trial of capsaicin cream 0.025% 4 times daily Patient wishes to avoid p.o. medication given her multiple medication sensitivities  2.  Bilateral  foot pain at the instep worse when taking the first few steps after resting Symptoms suggestive of plantar fasciitis will instruct in stretching program for the gastrosoleus complex as well  as the plantar fascia.  May need orthotics potential referral to podiatry. Have discussed freezing a follow of water and rolling her foot over this for about 5 minutes prior to getting up  3.  Adjustment disorder status post treatment for breast carcinoma with complication of chemotherapy-induced neuropathy referral to neuropsychology for adjustment disorder with depressed mood

## 2018-01-16 NOTE — Addendum Note (Signed)
Addended by: Charlett Blake on: 01/16/2018 01:04 PM   Modules accepted: Level of Service

## 2018-01-20 ENCOUNTER — Encounter: Payer: BLUE CROSS/BLUE SHIELD | Admitting: Psychology

## 2018-01-20 DIAGNOSIS — G620 Drug-induced polyneuropathy: Secondary | ICD-10-CM

## 2018-01-20 DIAGNOSIS — F419 Anxiety disorder, unspecified: Secondary | ICD-10-CM

## 2018-01-20 DIAGNOSIS — F4321 Adjustment disorder with depressed mood: Secondary | ICD-10-CM

## 2018-01-20 DIAGNOSIS — F329 Major depressive disorder, single episode, unspecified: Secondary | ICD-10-CM

## 2018-01-20 DIAGNOSIS — F32A Depression, unspecified: Secondary | ICD-10-CM

## 2018-01-20 DIAGNOSIS — T451X5A Adverse effect of antineoplastic and immunosuppressive drugs, initial encounter: Secondary | ICD-10-CM | POA: Diagnosis not present

## 2018-01-20 DIAGNOSIS — M722 Plantar fascial fibromatosis: Secondary | ICD-10-CM | POA: Diagnosis not present

## 2018-01-30 ENCOUNTER — Encounter: Payer: Self-pay | Admitting: General Practice

## 2018-01-30 NOTE — Progress Notes (Signed)
[place] Progress Notes  CSW Somers received voicemail from patient Stacy [name] 11-Jan-2061 Lindi Adie pt) requesting to meet with a Education officer, museum.  She did not specify her need.  "I want to schedule a counseling session."  Reports she is overwhelmed by multiple stressors including family stressors, work, panic attacks at work, cancer treatment.  "It's everything piling up."  Has completed active cancer treatment, continues in survivorship treatment w Dr Lindi Adie. Also being treated at pain management clinic for neuropathy.  Working full time as well as caring for disabled husband and grandchild in the home.   Patient has connected today w her EAP program who will provide names of referrals within 3 days.  CSW offered option of having [facility] reach out to her w appt w therapist and/or psychiatrist.  Patient would like this referral made, CSW messaged clinic to have them schedule w patient.  Edwyna Shell, LCSW Clinical Social Worker Phone:  [phone]

## 2018-01-30 NOTE — Progress Notes (Signed)
[place] CSW Progress Note  Patient has appt w [facility] on 5/30.  CSW will provide brief supportive counseling until that time - next appt scheduled for Friday May 16 at [place], [place] Worker Phone:  [phone]

## 2018-01-31 ENCOUNTER — Encounter: Payer: Self-pay | Admitting: Internal Medicine

## 2018-01-31 ENCOUNTER — Encounter: Payer: Self-pay | Admitting: General Practice
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
## 2018-02-03 ENCOUNTER — Ambulatory Visit: Payer: BLUE CROSS/BLUE SHIELD | Admitting: Internal Medicine

## 2018-02-03 ENCOUNTER — Encounter: Payer: Self-pay | Admitting: Internal Medicine

## 2018-02-03 VITALS — BP 138/80 | HR 81 | Temp 98.0°F | Wt 224.0 lb

## 2018-02-03 DIAGNOSIS — F329 Major depressive disorder, single episode, unspecified: Secondary | ICD-10-CM | POA: Diagnosis not present

## 2018-02-03 DIAGNOSIS — F419 Anxiety disorder, unspecified: Secondary | ICD-10-CM

## 2018-02-03 DIAGNOSIS — Z0289 Encounter for other administrative examinations: Secondary | ICD-10-CM | POA: Diagnosis not present

## 2018-02-03 DIAGNOSIS — F32A Depression, unspecified: Secondary | ICD-10-CM

## 2018-02-03 NOTE — Patient Instructions (Signed)

## 2018-02-03 NOTE — Progress Notes (Signed)
Subjective:    Patient ID: Stacy [name], female    DOB: 02/08/1961, 57 y.o.   MRN: [id number]  HPI  Pt presents to the clinic today with c/o anxiety and depression. She reports her anxiety and depression is being triggered by her battle with cancer. She reports she is having trouble focusing at work secondary to the anxiety. She reports she does not feel like she can deal with the general public. She has failed Zoloft, Effexor, Cymbalta, Xanax and Ativan in the past. She has been seeing a counselor at the cancer center. She has also been referred to behavioral health and has an appt with them on 5/30.  She has FMLA forms that she would like completed today.   Review of Systems      Past Medical History:  Diagnosis Date  . Anxiety   . Arthritis   . Complication of anesthesia    DIFFICULTY WAKING UP , LAST SURGERY NECK 2012 WAS FINE PER PT HERE AT [facility]  . Depression   . Fibromyalgia   . Genetic testing 12/21/2016   Genetic testing reported out on 12/20/2016 through [facility]'s 46-gene Common Hereditary Cancers panel found no deleterious mutations. The following 46 genes were analyzed: APC, ATM, AXIN2, BARD1, BMPR1A, BRCA1, BRCA2, BRIP1, CDH1, CDKN2A, CHEK2, CTNNA1, DICER1, EPCAM, GREM1, KIT, MEN1, MLH1, MSH2, MSH3, MSH6, MUTYH, NBN, NF1, NTHL1, PALB2, PDGFRA, PMS2, POLD1, POLE, PTEN, RAD50, RAD51C, RAD51D, SDH  . Headache    HX MIGRAINES    . Heart murmur   . History of radiation therapy 02/14/17-04/03/2017   left breast 60.4 Gy: 50.4 Gy delivered in 28 fractions and a boost of 10 Gy delivered in 5 fractions.  . Hypertension   . Malignant neoplasm of upper-outer quadrant of left female breast ([place]) 08/23/2016  . MVP (mitral valve prolapse)    AS INFANT  . Neck pain   . Personal history of chemotherapy   . Personal history of radiation therapy   . Vision abnormalities     Current Outpatient Medications  Medication Sig Dispense Refill  . hydrochlorothiazide (HYDRODIURIL) 25 MG tablet  Take 1 tablet (25 mg total) by mouth daily. MUST SCHEDULE ANNUAL EXAM 90 tablet 3  . ibuprofen (ADVIL,MOTRIN) 200 MG tablet Take 400 mg by mouth every 4 (four) hours as needed for headache, mild pain or moderate pain.     Marland Kitchen triamcinolone cream (KENALOG) 0.1 % Apply 1 application topically 2 (two) times daily. 30 g 0   No current facility-administered medications for this visit.     Allergies  Allergen Reactions  . Gabapentin Swelling    "makes me feel detached"  . Naproxen Palpitations  . Neulasta [Pegfilgrastim] Itching and Other (See Comments)    Patient stated,"reddish skin tone, body on fire, tingly tongue."  . Erythromycin Nausea And Vomiting  . Tramadol Hcl Other (See Comments)    Headaches    Family History  Adopted: Yes  Problem Relation Age of Onset  . Breast cancer Brother 76       Maternal half-brother. Currently 31.  Marland Kitchen BRCA 1/2 Neg Hx     Social History   Socioeconomic History  . Marital status: Married    Spouse name: Not on file  . Number of children: 4  . Years of education: Not on file  . Highest education level: Not on file  Occupational History  . Not on file  Social Needs  . Financial resource strain: Not on file  . Food insecurity:  Worry: Not on file    Inability: Not on file  . Transportation needs:    Medical: Not on file    Non-medical: Not on file  Tobacco Use  . Smoking status: Former Smoker    Packs/day: 0.25    Types: Cigarettes    Last attempt to quit: 01/13/2017    Years since quitting: 1.0  . Smokeless tobacco: Never Used  Substance and Sexual Activity  . Alcohol use: No    Alcohol/week: 0.0 oz  . Drug use: No  . Sexual activity: Yes    Birth control/protection: Post-menopausal  Lifestyle  . Physical activity:    Days per week: Not on file    Minutes per session: Not on file  . Stress: Not on file  Relationships  . Social connections:    Talks on phone: Not on file    Gets together: Not on file    Attends religious  service: Not on file    Active member of club or organization: Not on file    Attends meetings of clubs or organizations: Not on file    Relationship status: Not on file  . Intimate partner violence:    Fear of current or ex partner: Not on file    Emotionally abused: Not on file    Physically abused: Not on file    Forced sexual activity: Not on file  Other Topics Concern  . Not on file  Social History Narrative  . Not on file     Constitutional: Denies fever, malaise, fatigue, headache or abrupt weight changes.  Neurological: Denies dizziness, difficulty with memory, difficulty with speech or problems with balance and coordination.  Psych: Pt reports anxiety and depression. Denies SI/HI.  No other specific complaints in a complete review of systems (except as listed in HPI above).  Objective:   Physical Exam  BP 138/80   Pulse 81   Temp 98 F (36.7 C) (Oral)   Wt 224 lb (101.6 kg)   LMP 11/07/2016   SpO2 99%   BMI 38.45 kg/m  Wt Readings from Last 3 Encounters:  02/03/18 224 lb (101.6 kg)  01/16/18 227 lb (103 kg)  12/31/17 226 lb 4 oz (102.6 kg)    General: Appears her stated age, obese in NAD. Neurological: Alert and oriented. Psychiatric: She is tearful today. Judgment and thought content normal.     BMET  Lipid Panel     Component Value Date/Time   CHOL 172 04/20/2016 1417   TRIG 127.0 04/20/2016 1417   HDL 59.10 04/20/2016 1417   CHOLHDL 3 04/20/2016 1417   VLDL 25.4 04/20/2016 1417   LDLCALC 88 04/20/2016 1417    CBC    Component Value Date/Time   WBC 11.0 (H) 12/31/2017 1256   RBC 4.87 12/31/2017 1256   HGB 14.5 12/31/2017 1256   HGB 13.0 09/11/2017 0902   HCT 42.8 12/31/2017 1256   HCT 38.6 09/11/2017 0902   PLT 298.0 12/31/2017 1256   PLT 263 09/11/2017 0902   MCV 88.0 12/31/2017 1256   MCV 88.7 09/11/2017 0902   MCH 30.4 10/18/2017 1538   MCHC 34.0 12/31/2017 1256   RDW 14.4 12/31/2017 1256   RDW 13.4 09/11/2017 0902   LYMPHSABS  1.9 12/31/2017 1256   LYMPHSABS 1.4 09/11/2017 0902   MONOABS 0.6 12/31/2017 1256   MONOABS 0.8 09/11/2017 0902   EOSABS 0.1 12/31/2017 1256   EOSABS 0.2 09/11/2017 0902   BASOSABS 0.1 12/31/2017 1256   BASOSABS 0.0 09/11/2017 0902  Hgb A1C Lab Results  Component Value Date   HGBA1C 5.6 04/20/2016            Assessment & Plan:   Anxiety and Depression:  Deteriorated Support offered today She will continue counseling and will keep Behavioral Health appt She wants to hold off on medication at this time She does contract for safety FMLA forms filled out today  Return precautions discussed Webb Silversmith, NP

## 2018-02-04 ENCOUNTER — Telehealth: Payer: Self-pay | Admitting: Adult Health

## 2018-02-04 ENCOUNTER — Inpatient Hospital Stay: Payer: BLUE CROSS/BLUE SHIELD | Attending: Adult Health | Admitting: Adult Health

## 2018-02-04 ENCOUNTER — Encounter: Payer: Self-pay | Admitting: Adult Health

## 2018-02-04 VITALS — BP 130/84 | HR 78 | Temp 98.0°F | Resp 20 | Ht 64.0 in | Wt 224.4 lb

## 2018-02-04 DIAGNOSIS — Z170 Estrogen receptor positive status [ER+]: Secondary | ICD-10-CM

## 2018-02-04 DIAGNOSIS — IMO0001 Reserved for inherently not codable concepts without codable children: Secondary | ICD-10-CM

## 2018-02-04 DIAGNOSIS — Z853 Personal history of malignant neoplasm of breast: Secondary | ICD-10-CM | POA: Diagnosis not present

## 2018-02-04 DIAGNOSIS — C50412 Malignant neoplasm of upper-outer quadrant of left female breast: Secondary | ICD-10-CM

## 2018-02-04 MED ORDER — MECLIZINE HCL 25 MG PO TABS: 25.0000 mg | ORAL_TABLET | Freq: Three times a day (TID) | ORAL | 0 refills | Status: DC | PRN

## 2018-02-04 NOTE — Telephone Encounter (Signed)
Per 5/21 no los °

## 2018-02-04 NOTE — Assessment & Plan Note (Addendum)
Left lumpectomy 09/06/16: IDC grade 2, 3.4 cm, Margins Neg, 0/1 LN neg, T2N0 ER 90%, PR 70%, Ki-67 30%, HER-2 positive ratio 2.43 (stage 2 A)  Treatment Plan: 1. adjuvant chemotherapy TCHPfollowed by Herceptin-Perjetamaintenance to complete 1 year. 2. Adjuvant radiation therapycompleted 04/03/2017 3. Adjuvant antiestrogen therapy with Tamoxifen x 1 month (unable to tolerate) ----------------------------------------------------------------------------- Current Treatment: Observation.  Was given script for Letrozole, however patient has not started  I think her dizziness is likely related to vertigo.  We will get MRI brain to fully evaluate, and then if negative, will refer to neuro/vestibular rehabilitation.  I sent meclizine into her pharmacy.    Stacy [name] will return as scheduled next month with Dr. Lindi Adie.

## 2018-02-04 NOTE — Progress Notes (Signed)
[place] [facility] Follow up:    Stacy Fenton, Stacy [name] [place] [address]   DIAGNOSIS: Cancer Staging Malignant neoplasm of upper-outer quadrant of left female breast [facility]) Staging form: Breast, AJCC 7th Edition - Clinical: No stage assigned - Unsigned - Pathologic stage from 08/29/2016: Stage IIA (T2, N0, cM0) - Unsigned   SUMMARY OF ONCOLOGIC HISTORY:   Malignant neoplasm of upper-outer quadrant of left female breast ([place])   08/21/2016 Initial Diagnosis    Screening detected left breast mass UOQ at 1:00: 1.2 x 1 x 0.5 cm, axilla negative Left breast biopsy 1:00: IDC with DCIS, grade 1, ER 90%, PR 70%, Ki-67 30%, HER-2 positive ratio 2.43, T1c N0 stage IA clinical stage      09/06/2016 Surgery    Left lumpectomy: IDC grade 2, 3.4 cm, Margins Neg, 0/1 LN neg, ER 90%, PR 70%, Ki-67 30%, HER-2 positive ratio 2.43 T2N0 (stage 2 A)      10/09/2016 -  Chemotherapy    TCH Perjeta 6 cycles (Dose reduced due to peripheral neuropathy/neutropenia, last cycle without Taxotere due  followed by Herceptin Perjeta maintenance for 1 year      12/20/2016 Genetic Testing    Genetic counseling and testing for hereditary cancer syndromes performed on 11/22/2016. Results are negative for pathogenic mutations in 46 genes analyzed by [facility]'s Common Hereditary Cancers Panel. Results are dated 12/20/2016. Genes tested: APC, ATM, AXIN2, BARD1, BMPR1A, BRCA1, BRCA2, BRIP1, CDH1, CDKN2A, CHEK2, CTNNA1, DICER1, EPCAM, GREM1, KIT, MEN1, MLH1, MSH2, MSH3, MSH6, MUTYH, NBN, NF1, NTHL1, PALB2, PDGFRA, PMS2, POLD1, POLE, PTEN, RAD50, RAD51C, RAD51D, SDHA, SDHB, SDHC, SDHD, SMAD4, SMARCA4, STK11, TP53, TSC1, TSC2, and VHL.  A variant of uncertain significance (not clinically actionable) was noted in the KIT gene called c.[id number]>G (p.Ser867Arg). At this time, it is unknown if this variant is associated with increased cancer risk or if this is a normal finding. It should not be used to  make medical management decisions.      02/14/2017 - 04/03/2017 Radiation Therapy    Adjuvant radiation ([place]): The left breast was treated to 60.4 Gy, with a primary dose of 50.4 Gy delivered in 28 fractions of 1.8 Gy and a boost dose of 10 Gy delivered in 5 fractions of 2 Gy.      04/2017 - 05/2017 Anti-estrogen oral therapy    Tamoxifen daily        CURRENT THERAPY: Not taking anti estrogen therapy  INTERVAL HISTORY: Stacy [name] 57 y.o. female returns for evaluation of continued dizziness.  This has been going on for several months and is worsening.  She was worked up previously with orthostatics and everything was normal.  She was prescribed Letrozole for anti estrogen therapy, however she hasn't yet started it.  She is seeing a therapist, who urged her to make an appointment today for evaluation.  This happens with position changes.  It is worse also with turning her head.  She describes it as a room spinning sensation.  She last saw Dr. Lindi Adie in 09/2017 and didn't mention it to him.     Patient Active Problem List   Diagnosis Date Noted  . Plantar fasciitis, bilateral 01/16/2018  . Neuropathy due to chemotherapeutic drug ([place]) 01/16/2018  . Port catheter in place 10/09/2016  . Malignant neoplasm of upper-outer quadrant of left female breast ([place]) 08/23/2016  . Anxiety and depression 04/20/2016  . Arthritis 06/27/2015  . Adjustment disorder with depressed mood 01/30/2011  .  Essential hypertension 04/19/2009  . Fibromyalgia 02/18/2008  . MITRAL VALVE PROLAPSE, HX OF 02/18/2008    is allergic to gabapentin; naproxen; neulasta [pegfilgrastim]; erythromycin; and tramadol hcl.  MEDICAL HISTORY: Past Medical History:  Diagnosis Date  . Anxiety   . Arthritis   . Complication of anesthesia    DIFFICULTY WAKING UP , LAST SURGERY NECK 2012 WAS FINE PER PT HERE AT [facility]  . Depression   . Fibromyalgia   . Genetic testing 12/21/2016   Genetic testing reported out on 12/20/2016  through [facility]'s 46-gene Common Hereditary Cancers panel found no deleterious mutations. The following 46 genes were analyzed: APC, ATM, AXIN2, BARD1, BMPR1A, BRCA1, BRCA2, BRIP1, CDH1, CDKN2A, CHEK2, CTNNA1, DICER1, EPCAM, GREM1, KIT, MEN1, MLH1, MSH2, MSH3, MSH6, MUTYH, NBN, NF1, NTHL1, PALB2, PDGFRA, PMS2, POLD1, POLE, PTEN, RAD50, RAD51C, RAD51D, SDH  . Headache    HX MIGRAINES    . Heart murmur   . History of radiation therapy 02/14/17-04/03/2017   left breast 60.4 Gy: 50.4 Gy delivered in 28 fractions and a boost of 10 Gy delivered in 5 fractions.  . Hypertension   . Malignant neoplasm of upper-outer quadrant of left female breast ([place]) 08/23/2016  . MVP (mitral valve prolapse)    AS INFANT  . Neck pain   . Personal history of chemotherapy   . Personal history of radiation therapy   . Vision abnormalities     SURGICAL HISTORY: Past Surgical History:  Procedure Laterality Date  . cervical fusiion  2012  . JOINT REPLACEMENT    . MITRAL VALVE REPAIR     MVP 1970  . PORT-A-CATH REMOVAL N/A 10/23/2017   Procedure: REMOVAL PORT-A-CATH;  Surgeon: [name], MD;  Location: [place];  Service: General;  Laterality: N/A;  . PORTACATH PLACEMENT Right 09/06/2016   Procedure: INSERTION PORT-A-CATH;  Surgeon: [name], MD;  Location: [place];  Service: General;  Laterality: Right;  . RADIOACTIVE SEED GUIDED PARTIAL MASTECTOMY WITH AXILLARY SENTINEL LYMPH NODE BIOPSY Left 09/06/2016   Procedure: RADIOACTIVE SEED GUIDED LEFT BREAST LUMPECTOMY  WITH SENTINEL LYMPH NODE BIOPSY;  Surgeon: [name], MD;  Location: [place];  Service: General;  Laterality: Left;, Per patient just "lumpectomy"  . ROTATOR CUFF REPAIR Right 2012  . TOTAL HIP ARTHROPLASTY Right 09/14/2015   Procedure: RIGHT TOTAL HIP ARTHROPLASTY ANTERIOR APPROACH;  Surgeon: Leandrew Koyanagi, MD;  Location: [place];  Service: Orthopedics;  Laterality: Right;  . TUBAL LIGATION  1988    SOCIAL  HISTORY: Social History   Socioeconomic History  . Marital status: Married    Spouse name: Not on file  . Number of children: 4  . Years of education: Not on file  . Highest education level: Not on file  Occupational History  . Not on file  Social Needs  . Financial resource strain: Not on file  . Food insecurity:    Worry: Not on file    Inability: Not on file  . Transportation needs:    Medical: Not on file    Non-medical: Not on file  Tobacco Use  . Smoking status: Former Smoker    Packs/day: 0.25    Types: Cigarettes    Last attempt to quit: 01/13/2017    Years since quitting: 1.0  . Smokeless tobacco: Never Used  Substance and Sexual Activity  . Alcohol use: No    Alcohol/week: 0.0 oz  . Drug use: No  . Sexual activity: Yes    Birth control/protection: Post-menopausal  Lifestyle  . Physical activity:    Days per week: Not on file    Minutes per session: Not on file  . Stress: Not on file  Relationships  . Social connections:    Talks on phone: Not on file    Gets together: Not on file    Attends religious service: Not on file    Active member of club or organization: Not on file    Attends meetings of clubs or organizations: Not on file    Relationship status: Not on file  . Intimate partner violence:    Fear of current or ex partner: Not on file    Emotionally abused: Not on file    Physically abused: Not on file    Forced sexual activity: Not on file  Other Topics Concern  . Not on file  Social History Narrative  . Not on file    FAMILY HISTORY: Family History  Adopted: Yes  Problem Relation Age of Onset  . Breast cancer Brother 54       Maternal half-brother. Currently 79.  Marland Kitchen BRCA 1/2 Neg Hx     Review of Systems  Constitutional: Negative for appetite change, chills, fatigue, fever and unexpected weight change.  HENT:   Negative for hearing loss, lump/mass and trouble swallowing.   Eyes: Negative for eye problems and icterus.  Respiratory:  Negative for chest tightness, cough and shortness of breath.   Cardiovascular: Negative for chest pain, leg swelling and palpitations.  Gastrointestinal: Negative for abdominal distention, abdominal pain, constipation, diarrhea, nausea and vomiting.  Endocrine: Negative for hot flashes.  Neurological: Positive for dizziness. Negative for extremity weakness, headaches, light-headedness and numbness.  Hematological: Negative for adenopathy. Does not bruise/bleed easily.      PHYSICAL EXAMINATION  ECOG PERFORMANCE STATUS: 1 - Symptomatic but completely ambulatory  Vitals:   02/04/18 0909  BP: 130/84  Pulse: 78  Resp: 20  Temp: 98 F (36.7 C)  SpO2: 97%    Physical Exam  Constitutional: She is oriented to person, place, and time. She appears well-developed and well-nourished.  HENT:  Head: Normocephalic and atraumatic.  Mouth/Throat: Oropharynx is clear and moist. No oropharyngeal exudate.  Eyes: Pupils are equal, round, and reactive to light. EOM are normal. No scleral icterus.  EOMI, however patient noted room spinning with EOMs  Neck: Normal range of motion. Neck supple.  Cardiovascular: Normal rate, regular rhythm and normal heart sounds.  Pulmonary/Chest: Effort normal and breath sounds normal.  Abdominal: Soft. Bowel sounds are normal.  Musculoskeletal: Normal range of motion. She exhibits no tenderness.  Neurological: She is alert and oriented to person, place, and time.  Skin: Skin is warm and dry.  Psychiatric: She has a normal mood and affect.    LABORATORY DATA:  CBC    Component Value Date/Time   WBC 11.0 (H) 12/31/2017 1256   RBC 4.87 12/31/2017 1256   HGB 14.5 12/31/2017 1256   HGB 13.0 09/11/2017 0902   HCT 42.8 12/31/2017 1256   HCT 38.6 09/11/2017 0902   PLT 298.0 12/31/2017 1256   PLT 263 09/11/2017 0902   MCV 88.0 12/31/2017 1256   MCV 88.7 09/11/2017 0902   MCH 30.4 10/18/2017 1538   MCHC 34.0 12/31/2017 1256   RDW 14.4 12/31/2017 1256   RDW  13.4 09/11/2017 0902   LYMPHSABS 1.9 12/31/2017 1256   LYMPHSABS 1.4 09/11/2017 0902   MONOABS 0.6 12/31/2017 1256   MONOABS 0.8 09/11/2017 0902   EOSABS 0.1 12/31/2017 1256  EOSABS 0.2 09/11/2017 0902   BASOSABS 0.1 12/31/2017 1256   BASOSABS 0.0 09/11/2017 0902    CMP     Component Value Date/Time   NA 132 (L) 12/31/2017 1256   NA 138 09/11/2017 0902   K 4.8 12/31/2017 1256   K 3.5 09/11/2017 0902   CL 99 12/31/2017 1256   CO2 26 12/31/2017 1256   CO2 25 09/11/2017 0902   GLUCOSE 97 12/31/2017 1256   GLUCOSE 98 09/11/2017 0902   BUN 16 12/31/2017 1256   BUN 18.2 09/11/2017 0902   CREATININE 0.97 12/31/2017 1256   CREATININE 1.0 09/11/2017 0902   CALCIUM 9.9 12/31/2017 1256   CALCIUM 9.4 09/11/2017 0902   PROT 7.6 12/31/2017 1256   PROT 7.4 09/11/2017 0902   ALBUMIN 4.4 12/31/2017 1256   ALBUMIN 3.7 09/11/2017 0902   AST 19 12/31/2017 1256   AST 15 09/11/2017 0902   ALT 16 12/31/2017 1256   ALT 13 09/11/2017 0902   ALKPHOS 67 12/31/2017 1256   ALKPHOS 93 09/11/2017 0902   BILITOT 0.4 12/31/2017 1256   BILITOT 0.42 09/11/2017 0902   GFRNONAA 49 (L) 10/18/2017 1538   GFRAA 57 (L) 10/18/2017 1538       PENDING LABS:   RADIOGRAPHIC STUDIES:  No results found.   PATHOLOGY:     ASSESSMENT and THERAPY PLAN:   Malignant neoplasm of upper-outer quadrant of left female breast ([place]) Left lumpectomy 09/06/16: IDC grade 2, 3.4 cm, Margins Neg, 0/1 LN neg, T2N0 ER 90%, PR 70%, Ki-67 30%, HER-2 positive ratio 2.43 (stage 2 A)  Treatment Plan: 1. adjuvant chemotherapy TCHPfollowed by Herceptin-Perjetamaintenance to complete 1 year. 2. Adjuvant radiation therapycompleted 04/03/2017 3. Adjuvant antiestrogen therapy with Tamoxifen x 1 month (unable to tolerate) ----------------------------------------------------------------------------- Current Treatment: Observation.  Was given script for Letrozole, however patient has not started  I think her dizziness is  likely related to vertigo.  We will get MRI brain to fully evaluate, and then if negative, will refer to neuro/vestibular rehabilitation.  I sent meclizine into her pharmacy.    [place] will return as scheduled next month with Dr. Lindi Adie.        Orders Placed This Encounter  Procedures  . MR Brain W Wo Contrast    Standing Status:   Future    Standing Expiration Date:   02/04/2019    Order Specific Question:   If indicated for the ordered procedure, I authorize the administration of contrast media per Radiology protocol    Answer:   Yes    Order Specific Question:   What is the patient's sedation requirement?    Answer:   No Sedation    Order Specific Question:   Does the patient have a pacemaker or implanted devices?    Answer:   No    Order Specific Question:   Use SRS Protocol?    Answer:   No    Order Specific Question:   Radiology Contrast Protocol - do NOT remove file path    Answer:   [email or web address]    Order Specific Question:   Preferred imaging location?    Answer:   [facility] (table limit-550lbs)    All questions were answered. The patient knows to call the clinic with any problems, questions or concerns. We can certainly see the patient much sooner if necessary.  A total of (30) minutes of face-to-face time was spent with this patient with greater than 50% of that time in counseling and care-coordination.  This note  was electronically signed. Scot Dock, Stacy [name] 02/04/2018

## 2018-02-07 ENCOUNTER — Telehealth: Payer: Self-pay | Admitting: General Practice

## 2018-02-07 NOTE — Telephone Encounter (Signed)
[place] CSW Progress Notes  Patient no show for scheduled appt this AM.  Called patient to check in, no answer, left VM requesting call back.  Edwyna Shell, LCSW Clinical Social Worker Phone:  [phone]

## 2018-02-13 ENCOUNTER — Ambulatory Visit (HOSPITAL_COMMUNITY): Payer: BLUE CROSS/BLUE SHIELD | Admitting: Licensed Clinical Social Worker

## 2018-02-14 ENCOUNTER — Ambulatory Visit: Payer: BLUE CROSS/BLUE SHIELD | Admitting: Physical Medicine & Rehabilitation

## 2018-02-14 ENCOUNTER — Encounter: Payer: Self-pay | Admitting: Physical Medicine & Rehabilitation

## 2018-02-14 VITALS — BP 131/82 | HR 95 | Ht 64.0 in | Wt 224.0 lb

## 2018-02-14 DIAGNOSIS — G8929 Other chronic pain: Secondary | ICD-10-CM

## 2018-02-14 DIAGNOSIS — M79673 Pain in unspecified foot: Secondary | ICD-10-CM | POA: Diagnosis not present

## 2018-02-14 DIAGNOSIS — R202 Paresthesia of skin: Secondary | ICD-10-CM | POA: Diagnosis not present

## 2018-02-14 DIAGNOSIS — R200 Anesthesia of skin: Secondary | ICD-10-CM

## 2018-02-14 DIAGNOSIS — F4321 Adjustment disorder with depressed mood: Secondary | ICD-10-CM | POA: Diagnosis not present

## 2018-02-14 DIAGNOSIS — M722 Plantar fascial fibromatosis: Secondary | ICD-10-CM | POA: Diagnosis not present

## 2018-02-14 DIAGNOSIS — G620 Drug-induced polyneuropathy: Secondary | ICD-10-CM | POA: Diagnosis not present

## 2018-02-14 DIAGNOSIS — T451X5A Adverse effect of antineoplastic and immunosuppressive drugs, initial encounter: Secondary | ICD-10-CM | POA: Diagnosis not present

## 2018-02-14 NOTE — Progress Notes (Signed)
Patient has evidence of left ulnar neuropathy at the elbow. There is no evidence of neuropathy in the left lower limb. Please see media for scanned EMG report

## 2018-02-14 NOTE — Patient Instructions (Signed)
You have a left ulnar neuropathy at the elbow  No evidence of neuropathy in Left foot , therefore right foot was not tested.  Referral to podiatry

## 2018-02-17 ENCOUNTER — Encounter: Payer: Self-pay | Admitting: Adult Health

## 2018-02-19 ENCOUNTER — Telehealth: Payer: Self-pay | Admitting: Internal Medicine

## 2018-02-19 ENCOUNTER — Encounter: Payer: Self-pay | Admitting: Internal Medicine

## 2018-02-19 NOTE — Telephone Encounter (Signed)
[employer] return to work certification [place] in box

## 2018-02-19 NOTE — Telephone Encounter (Signed)
I have printed off the forms and placed in your box

## 2018-02-21 ENCOUNTER — Ambulatory Visit: Payer: BLUE CROSS/BLUE SHIELD | Admitting: Podiatry

## 2018-02-21 ENCOUNTER — Ambulatory Visit (INDEPENDENT_AMBULATORY_CARE_PROVIDER_SITE_OTHER): Payer: BLUE CROSS/BLUE SHIELD

## 2018-02-21 ENCOUNTER — Encounter: Payer: Self-pay | Admitting: Podiatry

## 2018-02-21 DIAGNOSIS — M722 Plantar fascial fibromatosis: Secondary | ICD-10-CM

## 2018-02-21 DIAGNOSIS — M779 Enthesopathy, unspecified: Secondary | ICD-10-CM

## 2018-02-21 DIAGNOSIS — Z0279 Encounter for issue of other medical certificate: Secondary | ICD-10-CM

## 2018-02-21 MED ORDER — IBUPROFEN 800 MG PO TABS: 800.0000 mg | ORAL_TABLET | Freq: Three times a day (TID) | ORAL | 0 refills | Status: DC | PRN

## 2018-02-21 NOTE — Progress Notes (Signed)
Subjective:    Patient ID: Stacy [name], female    DOB: 24-Jan-1961, 57 y.o.   MRN: [id number]  HPI 57 year old female presents the office today for concerns of bilateral foot pain.  She states this started while getting chemotherapy in 2018.  She states that she gets pain in the bottom of her feet but she also has some pain to the top of her foot and she points on the instep on the right side.  She denies any recent injury or trauma she denies any significant swelling or redness.  She does note some calf tightness at times.  This is been ongoing issue is not new.  Denies any swelling to her calf or redness.  She said no recent treatment.  She has no other concerns today.   Review of Systems  All other systems reviewed and are negative.  Past Medical History:  Diagnosis Date  . Anxiety   . Arthritis   . Complication of anesthesia    DIFFICULTY WAKING UP , LAST SURGERY NECK 2012 WAS FINE PER PT HERE AT [facility]  . Depression   . Fibromyalgia   . Genetic testing 12/21/2016   Genetic testing reported out on 12/20/2016 through [facility]'s 46-gene Common Hereditary Cancers panel found no deleterious mutations. The following 46 genes were analyzed: APC, ATM, AXIN2, BARD1, BMPR1A, BRCA1, BRCA2, BRIP1, CDH1, CDKN2A, CHEK2, CTNNA1, DICER1, EPCAM, GREM1, KIT, MEN1, MLH1, MSH2, MSH3, MSH6, MUTYH, NBN, NF1, NTHL1, PALB2, PDGFRA, PMS2, POLD1, POLE, PTEN, RAD50, RAD51C, RAD51D, SDH  . Headache    HX MIGRAINES    . Heart murmur   . History of radiation therapy 02/14/17-04/03/2017   left breast 60.4 Gy: 50.4 Gy delivered in 28 fractions and a boost of 10 Gy delivered in 5 fractions.  . Hypertension   . Malignant neoplasm of upper-outer quadrant of left female breast ([place]) 08/23/2016  . MVP (mitral valve prolapse)    AS INFANT  . Neck pain   . Personal history of chemotherapy   . Personal history of radiation therapy   . Vision abnormalities     Past Surgical History:  Procedure Laterality Date  .  cervical fusiion  2012  . JOINT REPLACEMENT    . MITRAL VALVE REPAIR     MVP 1970  . PORT-A-CATH REMOVAL N/A 10/23/2017   Procedure: REMOVAL PORT-A-CATH;  Surgeon: [name], MD;  Location: [place];  Service: General;  Laterality: N/A;  . PORTACATH PLACEMENT Right 09/06/2016   Procedure: INSERTION PORT-A-CATH;  Surgeon: [name], MD;  Location: [place];  Service: General;  Laterality: Right;  . RADIOACTIVE SEED GUIDED PARTIAL MASTECTOMY WITH AXILLARY SENTINEL LYMPH NODE BIOPSY Left 09/06/2016   Procedure: RADIOACTIVE SEED GUIDED LEFT BREAST LUMPECTOMY  WITH SENTINEL LYMPH NODE BIOPSY;  Surgeon: [name], MD;  Location: [place];  Service: General;  Laterality: Left;, Per patient just "lumpectomy"  . ROTATOR CUFF REPAIR Right 2012  . TOTAL HIP ARTHROPLASTY Right 09/14/2015   Procedure: RIGHT TOTAL HIP ARTHROPLASTY ANTERIOR APPROACH;  Surgeon: Leandrew Koyanagi, MD;  Location: [place];  Service: Orthopedics;  Laterality: Right;  . TUBAL LIGATION  1988     Current Outpatient Medications:  .  fluticasone (FLONASE) 50 MCG/ACT nasal spray, Place 2 sprays into both nostrils daily as needed for allergies or rhinitis., Disp: , Rfl:  .  hydrochlorothiazide (HYDRODIURIL) 25 MG tablet, Take 1 tablet (25 mg total) by mouth daily. MUST SCHEDULE ANNUAL EXAM, Disp: 90 tablet, Rfl: 3 .  ibuprofen (ADVIL,MOTRIN) 200 MG tablet, Take 400 mg by mouth every 4 (four) hours as needed for headache, mild pain or moderate pain. , Disp: , Rfl:  .  ibuprofen (ADVIL,MOTRIN) 800 MG tablet, Take 1 tablet (800 mg total) by mouth every 8 (eight) hours as needed., Disp: 30 tablet, Rfl: 0  Allergies  Allergen Reactions  . Gabapentin Swelling    "makes me feel detached"  . Naproxen Palpitations  . Neulasta [Pegfilgrastim] Itching and Other (See Comments)    Patient stated,"reddish skin tone, body on fire, tingly tongue."  . Erythromycin Nausea And Vomiting  . Tramadol Hcl Other (See  Comments)    Headaches         Objective:   Physical Exam General: AAO x3, NAD  Dermatological: Skin is warm, dry and supple bilateral. Nails x 10 are well manicured; remaining integument appears unremarkable at this time. There are no open sores, no preulcerative lesions, no rash or signs of infection present.  Vascular: Dorsalis Pedis artery and Posterior Tibial artery pedal pulses are 2/4 bilateral with immedate capillary fill time. There is no pain with calf compression, swelling, warmth, erythema.   Neruologic: Grossly intact via light touch bilateral. Vibratory intact via tuning fork bilateral. Protective threshold with Semmes Wienstein monofilament intact to all pedal sites bilateral.   Negative Tinel sign. Musculoskeletal: There is tenderness along the dorsal medial aspect of the right foot but there is no specific area pinpoint bony tenderness.  There is tenderness in the plantar medial tubercle of the calcaneus at the insertion of the plantar fascia.  Mild discomfort on the medial band plantar fascia the arch of the foot.  Plantar fascia appears to be intact.  Achilles tendon intact but any pain.  No pain with lateral compression of the calcaneus no other areas of tenderness.  Muscular strength 5/5 in all groups tested bilateral.  Equinus is present.  Gait: Unassisted, Nonantalgic.      Assessment & Plan:  57 year old female with bilateral arch pain, heel pain likely plantar fasciitis; capsulitis -Treatment options discussed including all alternatives, risks, and complications -Etiology of symptoms were discussed -X-rays were obtained and reviewed with the patient. No definitive evidence of acute fracture or stress fracture identified today. -Discussed steroid injection which he was developed.  Prescribed ibuprofen 800 mg 3 times daily.  She is been taking ibuprofen without any side effects.  Continue to monitor for this.  Plantar fascial braces were dispensed x2.  Discussed  stretching, icing exercises daily.  Also discussed the change in shoes as well as inserts for her shoes.  Return in about 3 weeks (around 03/14/2018).  Trula Slade DPM

## 2018-02-21 NOTE — Telephone Encounter (Signed)
Done, placed in MYD box 

## 2018-02-21 NOTE — Telephone Encounter (Signed)
Paperwork faxed °Pt aware °Copy for scan °Copy for pt °

## 2018-02-21 NOTE — Patient Instructions (Signed)

## 2018-02-24 ENCOUNTER — Encounter: Payer: Self-pay | Admitting: Internal Medicine

## 2018-02-26 ENCOUNTER — Other Ambulatory Visit: Payer: Self-pay

## 2018-02-26 ENCOUNTER — Inpatient Hospital Stay: Payer: BLUE CROSS/BLUE SHIELD | Attending: Adult Health | Admitting: Hematology and Oncology

## 2018-02-26 ENCOUNTER — Ambulatory Visit (HOSPITAL_COMMUNITY)
Admission: RE | Admit: 2018-02-26 | Discharge: 2018-02-26 | Disposition: A | Discharge status: Home / Self Care | Payer: BLUE CROSS/BLUE SHIELD | Source: Ambulatory Visit | Attending: Hematology and Oncology | Admitting: Hematology and Oncology

## 2018-02-26 VITALS — BP 143/77 | HR 82 | Temp 98.4°F | Resp 19 | Ht 64.0 in | Wt 224.6 lb

## 2018-02-26 DIAGNOSIS — R42 Dizziness and giddiness: Secondary | ICD-10-CM

## 2018-02-26 DIAGNOSIS — Z853 Personal history of malignant neoplasm of breast: Secondary | ICD-10-CM | POA: Diagnosis not present

## 2018-02-26 DIAGNOSIS — C50412 Malignant neoplasm of upper-outer quadrant of left female breast: Secondary | ICD-10-CM

## 2018-02-26 DIAGNOSIS — Z170 Estrogen receptor positive status [ER+]: Secondary | ICD-10-CM

## 2018-02-26 MED ORDER — DIAZEPAM 5 MG PO TABS: 5.0000 mg | ORAL_TABLET | Freq: Four times a day (QID) | ORAL | 0 refills | Status: DC | PRN

## 2018-02-26 MED ORDER — GADOBENATE DIMEGLUMINE 529 MG/ML IV SOLN
20.0000 mL | Freq: Once | INTRAVENOUS | Status: AC | PRN
  Administered 2018-02-26: 20 mL via INTRAVENOUS

## 2018-02-26 NOTE — Assessment & Plan Note (Signed)
Left lumpectomy 09/06/16: IDC grade 2, 3.4 cm, Margins Neg, 0/1 LN neg, T2N0 ER 90%, PR 70%, Ki-67 30%, HER-2 positive ratio 2.43 (stage 2 A)  Treatment Plan: 1. adjuvant chemotherapy TCHPfollowed by Herceptin-Perjetamaintenance to complete 1 year. 2. Adjuvant radiation therapycompleted 04/03/2017 3. Adjuvant antiestrogen therapy with Tamoxifen x 1 month (unable to tolerate) ----------------------------------------------------------------------------- Current Treatment: Was given script for Letrozole, however patient has not started  Severe dizziness and vertigo: This is been going on for about a month.  This happens almost on a day-to-day basis.  This is been quite debilitating for her.  We will need to obtain a brain MRI stat today.   I will call her with results of these tests and then determine what next steps are needed.  If there is no evidence of stroke or brain metastases then we may send her to physical therapy for benign positional vertigo evaluation.  She will hold off on taking antiestrogen therapy until she feels better. Return to clinic based upon the MRI result.

## 2018-02-26 NOTE — Progress Notes (Signed)
Pt scheduled for MRI of brain tonight at 9pm [facility]. Pt requesting to help with claustrophobia. Obtained order for valium. Will escribe to preferred pharmacy of choice. Pt verbalized and confirmed appt for MRI at [facility], she will also need a driver and gave pt directions on where she will need to check in for MRI. No further needs.

## 2018-02-26 NOTE — Progress Notes (Signed)
Patient Care Team: [name], NP as PCP - General (Internal Medicine) [name], MD as Consulting Physician (General Surgery) Nicholas Lose, MD as Consulting Physician (Hematology and Oncology) Gery Pray, MD as Consulting Physician (Radiation Oncology) Delice Bison Charlestine Massed, NP as Nurse Practitioner (Hematology and Oncology) Larey Dresser, MD as Consulting Physician (Cardiology)  DIAGNOSIS:  Encounter Diagnoses  Name Primary?  . Dizziness Yes  . Malignant neoplasm of upper-outer quadrant of left breast in female, estrogen receptor positive ([place])     SUMMARY OF ONCOLOGIC HISTORY:   Malignant neoplasm of upper-outer quadrant of left female breast ([place])   08/21/2016 Initial Diagnosis    Screening detected left breast mass UOQ at 1:00: 1.2 x 1 x 0.5 cm, axilla negative Left breast biopsy 1:00: IDC with DCIS, grade 1, ER 90%, PR 70%, Ki-67 30%, HER-2 positive ratio 2.43, T1c N0 stage IA clinical stage      09/06/2016 Surgery    Left lumpectomy: IDC grade 2, 3.4 cm, Margins Neg, 0/1 LN neg, ER 90%, PR 70%, Ki-67 30%, HER-2 positive ratio 2.43 T2N0 (stage 2 A)      10/09/2016 -  Chemotherapy    TCH Perjeta 6 cycles (Dose reduced due to peripheral neuropathy/neutropenia, last cycle without Taxotere due  followed by Herceptin Perjeta maintenance for 1 year      12/20/2016 Genetic Testing    Genetic counseling and testing for hereditary cancer syndromes performed on 11/22/2016. Results are negative for pathogenic mutations in 46 genes analyzed by [facility]'s Common Hereditary Cancers Panel. Results are dated 12/20/2016. Genes tested: APC, ATM, AXIN2, BARD1, BMPR1A, BRCA1, BRCA2, BRIP1, CDH1, CDKN2A, CHEK2, CTNNA1, DICER1, EPCAM, GREM1, KIT, MEN1, MLH1, MSH2, MSH3, MSH6, MUTYH, NBN, NF1, NTHL1, PALB2, PDGFRA, PMS2, POLD1, POLE, PTEN, RAD50, RAD51C, RAD51D, SDHA, SDHB, SDHC, SDHD, SMAD4, SMARCA4, STK11, TP53, TSC1, TSC2, and VHL.  A variant of uncertain significance (not  clinically actionable) was noted in the KIT gene called [id number].[id number]>G (p.Ser867Arg). At this time, it is unknown if this variant is associated with increased cancer risk or if this is a normal finding. It should not be used to make medical management decisions.      02/14/2017 - 04/03/2017 Radiation Therapy    Adjuvant radiation ([place]): The left breast was treated to 60.4 Gy, with a primary dose of 50.4 Gy delivered in 28 fractions of 1.8 Gy and a boost dose of 10 Gy delivered in 5 fractions of 2 Gy.      04/2017 - 05/2017 Anti-estrogen oral therapy    Tamoxifen daily        CHIEF COMPLIANT: Severe dizziness and vertigo  INTERVAL HISTORY: Stacy [name] is a 57 year old with above-mentioned history of breast cancer treated with surgery followed by adjuvant chemotherapy with Herceptin and Perjeta.  She completed maintenance Herceptin and then received radiation.  She has been off antiestrogen therapy because of problems with dizziness and vertigo.  This is been going on for over a month.  She was seen by her nurse practitioner who ordered a brain MRI which was never performed.  She is here today to discuss her symptoms and to come up with the treatment plan.  This dizziness and lightheadedness is almost on a once a day basis.  It seems to happen when she gets up from a sitting position but not when she gets him from a lying down position.  They have checked her blood pressures and they were normal.  She is in tears because of the symptoms.  REVIEW  OF SYSTEMS:   Constitutional: Denies fevers, chills or abnormal weight loss Eyes: Denies blurriness of vision Ears, nose, mouth, throat, and face: Denies mucositis or sore throat Respiratory: Denies cough, dyspnea or wheezes Cardiovascular: Denies palpitation, chest discomfort Gastrointestinal:  Denies nausea, heartburn or change in bowel habits Skin: Denies abnormal skin rashes Lymphatics: Denies new lymphadenopathy or easy bruising Neurological:Denies  numbness, tingling or new weaknesses Behavioral/Psych: Mood is stable, no new changes  Extremities: No lower extremity edema Breast:  All other systems were reviewed with the patient and are negative.  I have reviewed the past medical history, past surgical history, social history and family history with the patient and they are unchanged from previous note.  ALLERGIES:  is allergic to gabapentin; naproxen; neulasta [pegfilgrastim]; erythromycin; and tramadol hcl.  MEDICATIONS:  Current Outpatient Medications  Medication Sig Dispense Refill  . fluticasone (FLONASE) 50 MCG/ACT nasal spray Place 2 sprays into both nostrils daily as needed for allergies or rhinitis.    . hydrochlorothiazide (HYDRODIURIL) 25 MG tablet Take 1 tablet (25 mg total) by mouth daily. MUST SCHEDULE ANNUAL EXAM 90 tablet 3  . ibuprofen (ADVIL,MOTRIN) 200 MG tablet Take 400 mg by mouth every 4 (four) hours as needed for headache, mild pain or moderate pain.     Marland Kitchen ibuprofen (ADVIL,MOTRIN) 800 MG tablet Take 1 tablet (800 mg total) by mouth every 8 (eight) hours as needed. 30 tablet 0   No current facility-administered medications for this visit.     PHYSICAL EXAMINATION: ECOG PERFORMANCE STATUS: 1 - Symptomatic but completely ambulatory  Vitals:   02/26/18 1133  BP: (!) 143/77  Pulse: 82  Resp: 19  Temp: 98.4 F (36.9 C)  SpO2: 97%   Filed Weights   02/26/18 1133  Weight: 224 lb 9.6 oz (101.9 kg)    GENERAL:alert, no distress and comfortable SKIN: skin color, texture, turgor are normal, no rashes or significant lesions EYES: normal, Conjunctiva are pink and non-injected, sclera clear OROPHARYNX:no exudate, no erythema and lips, buccal mucosa, and tongue normal  NECK: supple, thyroid normal size, non-tender, without nodularity LYMPH:  no palpable lymphadenopathy in the cervical, axillary or inguinal LUNGS: clear to auscultation and percussion with normal breathing effort HEART: regular rate & rhythm and  no murmurs and no lower extremity edema ABDOMEN:abdomen soft, non-tender and normal bowel sounds MUSCULOSKELETAL:no cyanosis of digits and no clubbing  NEURO: Dizziness and lightheadedness no motor deficits, slight ataxia EXTREMITIES: No lower extremity edema  LABORATORY DATA:  I have reviewed the data as listed CMP Latest Ref Rng & Units 12/31/2017 10/18/2017 10/02/2017  Glucose 70 - 99 mg/dL 97 99 126  BUN 6 - 23 mg/dL 16 14 20   Creatinine 0.40 - 1.20 mg/dL 0.97 1.22(H) 1.05  Sodium 135 - 145 mEq/L 132(L) 136 136  Potassium 3.5 - 5.1 mEq/L 4.8 3.7 3.6  Chloride 96 - 112 mEq/L 99 100(L) 101  CO2 19 - 32 mEq/L 26 25 25   Calcium 8.4 - 10.5 mg/dL 9.9 9.4 9.4  Total Protein 6.0 - 8.3 g/dL 7.6 - 7.3  Total Bilirubin 0.2 - 1.2 mg/dL 0.4 - 0.4  Alkaline Phos 39 - 117 U/L 67 - 85  AST 0 - 37 U/L 19 - 15  ALT 0 - 35 U/L 16 - 14    Lab Results  Component Value Date   WBC 11.0 (H) 12/31/2017   HGB 14.5 12/31/2017   HCT 42.8 12/31/2017   MCV 88.0 12/31/2017   PLT 298.0 12/31/2017   NEUTROABS 8.2 (  H) 12/31/2017    ASSESSMENT & PLAN:  Malignant neoplasm of upper-outer quadrant of left female breast ([place]) Left lumpectomy 09/06/16: IDC grade 2, 3.4 cm, Margins Neg, 0/1 LN neg, T2N0 ER 90%, PR 70%, Ki-67 30%, HER-2 positive ratio 2.43 (stage 2 A)  Treatment Plan: 1. adjuvant chemotherapy TCHPfollowed by Herceptin-Perjetamaintenance to complete 1 year. 2. Adjuvant radiation therapycompleted 04/03/2017 3. Adjuvant antiestrogen therapy with Tamoxifen x 1 month (unable to tolerate) ----------------------------------------------------------------------------- Current Treatment: Was given script for Letrozole, however patient has not started  Severe dizziness and vertigo: This is been going on for about a month.  This happens almost on a day-to-day basis.  This is been quite debilitating for her.  We will need to obtain a brain MRI stat today.   I will call her with results of these tests and  then determine what next steps are needed.  If there is no evidence of stroke or brain metastases then we may send her to physical therapy for benign positional vertigo evaluation.  She will hold off on taking antiestrogen therapy until she feels better. Return to clinic based upon the MRI result.         Orders Placed This Encounter  Procedures  . MR Brain W Wo Contrast    Patient can leave after procedure per provider    Standing Status:   Future    Standing Expiration Date:   02/26/2019    Order Specific Question:   If indicated for the ordered procedure, I authorize the administration of contrast media per Radiology protocol    Answer:   Yes    Order Specific Question:   What is the patient's sedation requirement?    Answer:   No Sedation    Order Specific Question:   Does the patient have a pacemaker or implanted devices?    Answer:   No    Order Specific Question:   Use SRS Protocol?    Answer:   No    Order Specific Question:   Call Results- Best Contact Number?    Answer:   [phone] [place]    Order Specific Question:   Radiology Contrast Protocol - do NOT remove file path    Answer:   [email or web address]    Order Specific Question:   Reason for Exam additional comments    Answer:   Dizziness and Vertigo with breast cancer history    Order Specific Question:   Preferred imaging location?    Answer:   [facility] (table limit-350 lbs)   The patient has a good understanding of the overall plan. she agrees with it. she will call with any problems that may develop before the next visit here.   Harriette Ohara, MD 02/26/18

## 2018-02-27 ENCOUNTER — Other Ambulatory Visit: Payer: Self-pay | Admitting: Hematology and Oncology

## 2018-02-27 ENCOUNTER — Telehealth: Payer: Self-pay | Admitting: Hematology and Oncology

## 2018-02-27 DIAGNOSIS — R42 Dizziness and giddiness: Secondary | ICD-10-CM

## 2018-02-27 LAB — POCT I-STAT CREATININE: CREATININE: 0.9 mg/dL (ref 0.44–1.00)

## 2018-02-27 NOTE — Telephone Encounter (Signed)
I informed the patient that the brain MRI was normal. I will refer the patient for physical therapy to work on the vertigo

## 2018-02-28 ENCOUNTER — Other Ambulatory Visit: Payer: Self-pay | Admitting: *Deleted

## 2018-03-05 ENCOUNTER — Encounter: Payer: Self-pay | Admitting: Psychology

## 2018-03-05 NOTE — Progress Notes (Signed)
Neuropsychological Consultation   Patient:   Stacy [name]   DOB:   1961-06-25  MR Number:  [id number]  Location:  [facility] [address] [place] [id number] [place] [id number] Dept: [phone]           Date of Service:   01/20/2018  Start Time:   1 PM End Time:   2 PM  Provider/Observer:  Ilean Skill, Psy.D.       Clinical Neuropsychologist       Billing Code/Service: 82641 4 Units  Chief Complaint:    Stacy [name] 57 year old female referred by Dr. Letta Pate for neuropsychological/consultation due to significant stress, PTSD, and chronic pain following treatment for breast cancer surgery.  The patient reports that the onset of this occurred in August 2018.  The patient continues to follow-up with oncology but is no longer receiving active treatment.  The patient did follow-up with cardiology due to concerns about cardiomyopathy.  Patient has significant pain on the bilateral instep of her heel.  No prior history of foot injuries or foot surgery.  The patient also experiences numbness in her hands.  The patient describes significant cognitive issues that she describes as "chemo fog" she describes her memory is horrible and often questions whether she should have done the chemotherapy.  The patient reports that she feels depressed and difficulty with cognitive functioning and experiences PTSD type symptoms from the cancer itself.  Sleep disturbance is also noted.  Reason for Service:  Stacy [name] was referred for neuropsychological/psychological consultation due to ongoing chronic pain and neuropathy from chemotherapy as well as reduce cognitive efficiency and functioning particular around memory and problem-solving as well as PTSD and depression symptoms.  Current Status:  The patient describes ongoing cognitive difficulties related to memory and other cognitive functioning  is been problematic as well as anxiety and depression/PTSD around issues of her cancer, sleep disturbance, and chronic pain related to neuropathy secondary to chemotherapy.  Reliability of Information: The information is provided her a 1 hour face-to-face clinical interview with the patient as well as review of available medical records.  Behavioral Observation: Stacy [name]  presents as a 57 y.o.-year-old Right  Female who appeared her stated age. her dress was Appropriate and she was Well Groomed and her manners were Appropriate to the situation.  her participation was indicative of Appropriate and Inattentive behaviors.  There were any physical disabilities noted.  she displayed an appropriate level of cooperation and motivation.     Interactions:    Active Appropriate  Attention:   abnormal and attention span appeared shorter than expected for age  Memory:   abnormal; remote memory intact, recent memory impaired  Visuo-spatial:  not examined  Speech (Volume):  normal  Speech:   normal; normal  Thought Process:  Coherent and Relevant  Though Content:  WNL; not suicidal and not homicidal  Orientation:   person, place, time/date and situation  Judgment:   Good  Planning:   Good  Affect:    Anxious, Depressed and Tearful  Mood:    Depressed and Dysphoric  Insight:   Good  Intelligence:   high  Medical History:   Past Medical History:  Diagnosis Date  . Anxiety   . Arthritis   . Complication of anesthesia    DIFFICULTY WAKING UP , LAST SURGERY NECK 2012 WAS FINE PER PT HERE AT [facility]  . Depression   . Fibromyalgia   .  Genetic testing 12/21/2016   Genetic testing reported out on 12/20/2016 through [facility]'s 46-gene Common Hereditary Cancers panel found no deleterious mutations. The following 46 genes were analyzed: APC, ATM, AXIN2, BARD1, BMPR1A, BRCA1, BRCA2, BRIP1, CDH1, CDKN2A, CHEK2, CTNNA1, DICER1, EPCAM, GREM1, KIT, MEN1, MLH1, MSH2, MSH3, MSH6, MUTYH, NBN, NF1, NTHL1,  PALB2, PDGFRA, PMS2, POLD1, POLE, PTEN, RAD50, RAD51C, RAD51D, SDH  . Headache    HX MIGRAINES    . Heart murmur   . History of radiation therapy 02/14/17-04/03/2017   left breast 60.4 Gy: 50.4 Gy delivered in 28 fractions and a boost of 10 Gy delivered in 5 fractions.  . Hypertension   . Malignant neoplasm of upper-outer quadrant of left female breast ([place]) 08/23/2016  . MVP (mitral valve prolapse)    AS INFANT  . Neck pain   . Personal history of chemotherapy   . Personal history of radiation therapy   . Vision abnormalities         Abuse/Trauma History: The patient denies any significant prior traumatic experiences but does report that around the time of her diagnosis of breast cancer in December 2017 and chemotherapy in January 2018 that she started to have flashback and fear response along with nightmares.  Psychiatric History:  The patient denies any significant prior psychiatric history.  Family Med/Psych History:  Family History  Adopted: Yes  Problem Relation Age of Onset  . Breast cancer Brother 55       Maternal half-brother. Currently 23.  Marland Kitchen BRCA 1/2 Neg Hx     Risk of Suicide/Violence: low the patient denies any suicidal or homicidal ideation but does acknowledge significant depression and cognitive difficulties.  The patient does report that she has many times where she wishes that she had "rolled the dice and not done the chemotherapy."  However, she has no impulse to actively harm herself.  Impression/DX:  Stacy [name] 57 year old female referred by Dr. Letta Pate for neuropsychological/consultation due to significant stress, PTSD, and chronic pain following treatment for breast cancer surgery.  The patient reports that the onset of this occurred in August 2018.  The patient continues to follow-up with oncology but is no longer receiving active treatment.  The patient did follow-up with cardiology due to concerns about cardiomyopathy.  Patient has significant pain on the  bilateral instep of her heel.  No prior history of foot injuries or foot surgery.  The patient also experiences numbness in her hands.  The patient describes significant cognitive issues that she describes as "chemo fog" she describes her memory is horrible and often questions whether she should have done the chemotherapy.  The patient reports that she feels depressed and difficulty with cognitive functioning and experiences PTSD type symptoms from the cancer itself.  Sleep disturbance is also noted.  The patient describes ongoing cognitive difficulties related to memory and other cognitive functioning is been problematic as well as anxiety and depression/PTSD around issues of her cancer, sleep disturbance, and chronic pain related to neuropathy secondary to chemotherapy.   Disposition/Plan:  I set the patient up for 3 therapeutic interventions so far approximately 2 weeks apart.  Diagnosis:    Neuropathy due to chemotherapeutic drug ([place])  Adjustment disorder with depressed mood  Anxiety and depression         Electronically Signed   _______________________ Ilean Skill, Psy.D.

## 2018-03-06 ENCOUNTER — Encounter: Payer: BLUE CROSS/BLUE SHIELD | Admitting: Psychology

## 2018-03-07 ENCOUNTER — Ambulatory Visit: Payer: BLUE CROSS/BLUE SHIELD | Admitting: Podiatry

## 2018-03-07 ENCOUNTER — Ambulatory Visit (INDEPENDENT_AMBULATORY_CARE_PROVIDER_SITE_OTHER): Payer: BLUE CROSS/BLUE SHIELD

## 2018-03-07 ENCOUNTER — Encounter: Payer: Self-pay | Admitting: Podiatry

## 2018-03-07 ENCOUNTER — Ambulatory Visit: Payer: BLUE CROSS/BLUE SHIELD | Admitting: Internal Medicine

## 2018-03-07 ENCOUNTER — Encounter: Payer: Self-pay | Admitting: Internal Medicine

## 2018-03-07 VITALS — BP 132/84 | HR 84 | Temp 97.9°F | Wt 228.0 lb

## 2018-03-07 DIAGNOSIS — M797 Fibromyalgia: Secondary | ICD-10-CM

## 2018-03-07 DIAGNOSIS — M722 Plantar fascial fibromatosis: Secondary | ICD-10-CM

## 2018-03-07 DIAGNOSIS — G4710 Hypersomnia, unspecified: Secondary | ICD-10-CM | POA: Diagnosis not present

## 2018-03-07 DIAGNOSIS — R42 Dizziness and giddiness: Secondary | ICD-10-CM | POA: Diagnosis not present

## 2018-03-07 MED ORDER — DICLOFENAC SODIUM 1 % TD GEL: 2.0000 g | Freq: Two times a day (BID) | TRANSDERMAL | 0 refills | Status: DC

## 2018-03-07 NOTE — Patient Instructions (Signed)

## 2018-03-07 NOTE — Patient Instructions (Signed)
Myofascial Pain Syndrome and Fibromyalgia Myofascial pain syndrome and fibromyalgia are both pain disorders. This pain may be felt mainly in your muscles.  Myofascial pain syndrome: ? Always has trigger points or tender points in the muscle that will cause pain when pressed. The pain may come and go. ? Usually affects your neck, upper back, and shoulder areas. The pain often radiates into your arms and hands.  Fibromyalgia: ? Has muscle pains and tenderness that come and go. ? Is often associated with fatigue and sleep disturbances. ? Has trigger points. ? Tends to be long-lasting (chronic), but is not life-threatening.  Fibromyalgia and myofascial pain are not the same. However, they often occur together. If you have both conditions, each can make the other worse. Both are common and can cause enough pain and fatigue to make day-to-day activities difficult. What are the causes? The exact causes of fibromyalgia and myofascial pain are not known. People with certain gene types may be more likely to develop fibromyalgia. Some factors can be triggers for both conditions, such as:  Spine disorders.  Arthritis.  Severe injury (trauma) and other physical stressors.  Being under a lot of stress.  A medical illness.  What are the signs or symptoms? Fibromyalgia The main symptom of fibromyalgia is widespread pain and tenderness in your muscles. This can vary over time. Pain is sometimes described as stabbing, shooting, or burning. You may have tingling or numbness, too. You may also have sleep problems and fatigue. You may wake up feeling tired and groggy (fibro fog). Other symptoms may include:  Bowel and bladder problems.  Headaches.  Visual problems.  Problems with odors and noises.  Depression or mood changes.  Painful menstrual periods (dysmenorrhea).  Dry skin or eyes.  Myofascial pain syndrome Symptoms of myofascial pain syndrome include:  Tight, ropy bands of  muscle.  Uncomfortable sensations in muscular areas, such as: ? Aching. ? Cramping. ? Burning. ? Numbness. ? Tingling. ? Muscle weakness.  Trouble moving certain muscles freely (range of motion).  How is this diagnosed? There are no specific tests to diagnose fibromyalgia or myofascial pain syndrome. Both can be hard to diagnose because their symptoms are common in many other conditions. Your health care provider may suspect one or both of these conditions based on your symptoms and medical history. Your health care provider will also do a physical exam. The key to diagnosing fibromyalgia is having pain, fatigue, and other symptoms for more than three months that cannot be explained by another condition. The key to diagnosing myofascial pain syndrome is finding trigger points in muscles that are tender and cause pain elsewhere in your body (referred pain). How is this treated? Treating fibromyalgia and myofascial pain often requires a team of health care providers. This usually starts with your primary provider and a physical therapist. You may also find it helpful to work with alternative health care providers, such as massage therapists or acupuncturists. Treatment for fibromyalgia may include medicines. This may include nonsteroidal anti-inflammatory drugs (NSAIDs), along with other medicines. Treatment for myofascial pain may also include:  NSAIDs.  Cooling and stretching of muscles.  Trigger point injections.  Sound wave (ultrasound) treatments to stimulate muscles.  Follow these instructions at home:  Take medicines only as directed by your health care provider.  Exercise as directed by your health care provider or physical therapist.  Try to avoid stressful situations.  Practice relaxation techniques to control your stress. You may want to try: ? Biofeedback. ? Visual   imagery. ? Hypnosis. ? Muscle relaxation. ? Yoga. ? Meditation.  Talk to your health care provider  about alternative treatments, such as acupuncture or massage treatment.  Maintain a healthy lifestyle. This includes eating a healthy diet and getting enough sleep.  Consider joining a support group.  Do not do activities that stress or strain your muscles. That includes repetitive motions and heavy lifting. Where to find more information:  National Fibromyalgia Association: www.fmaware.org  Arthritis Foundation: www.arthritis.org  American Chronic Pain Association: www.theacpa.org/condition/myofascial-pain Contact a health care provider if:  You have new symptoms.  Your symptoms get worse.  You have side effects from your medicines.  You have trouble sleeping.  Your condition is causing depression or anxiety. This information is not intended to replace advice given to you by your health care provider. Make sure you discuss any questions you have with your health care provider. Document Released: 09/03/2005 Document Revised: 02/09/2016 Document Reviewed: 06/09/2014 Elsevier Interactive Patient Education  2018 Elsevier Inc.  

## 2018-03-07 NOTE — Progress Notes (Signed)
Subjective:    Patient ID: Stacy [name], female    DOB: [birth year], 57 y.o.   MRN: [id number]  HPI  Pt presents to the clinic today to request approval for job accomodation. She reports she is not able to work past midnight, and needs 2 consecutive days off in a row. She reports when she tries to work past 6 pm, struggle staying awake to get home. She has a 45 minute drive. She also needs the 2 days off to recover from working secondary to plantar fasciitis, fibromyalgia and dizzy spells.  Review of Systems  Past Medical History:  Diagnosis Date  . Anxiety   . Arthritis   . Complication of anesthesia    DIFFICULTY WAKING UP , LAST SURGERY NECK 2012 WAS FINE PER PT HERE AT [facility]  . Depression   . Fibromyalgia   . Genetic testing 12/21/2016   Genetic testing reported out on 12/20/2016 through [facility]'s 46-gene Common Hereditary Cancers panel found no deleterious mutations. The following 46 genes were analyzed: APC, ATM, AXIN2, BARD1, BMPR1A, BRCA1, BRCA2, BRIP1, CDH1, CDKN2A, CHEK2, CTNNA1, DICER1, EPCAM, GREM1, KIT, MEN1, MLH1, MSH2, MSH3, MSH6, MUTYH, NBN, NF1, NTHL1, PALB2, PDGFRA, PMS2, POLD1, POLE, PTEN, RAD50, RAD51C, RAD51D, SDH  . Headache    HX MIGRAINES    . Heart murmur   . History of radiation therapy 02/14/17-04/03/2017   left breast 60.4 Gy: 50.4 Gy delivered in 28 fractions and a boost of 10 Gy delivered in 5 fractions.  . Hypertension   . Malignant neoplasm of upper-outer quadrant of left female breast ([place]) 08/23/2016  . MVP (mitral valve prolapse)    AS INFANT  . Neck pain   . Personal history of chemotherapy   . Personal history of radiation therapy   . Vision abnormalities     Current Outpatient Medications  Medication Sig Dispense Refill  . diazepam (VALIUM) 5 MG tablet Take 1 tablet (5 mg total) by mouth every 6 (six) hours as needed for anxiety (For MRI (Claustrophobia)). 2 tablet 0  . diclofenac sodium (VOLTAREN) 1 % GEL Apply 2 g topically 2 (two) times  daily. 1 Tube 0  . fluticasone (FLONASE) 50 MCG/ACT nasal spray Place 2 sprays into both nostrils daily as needed for allergies or rhinitis.    . hydrochlorothiazide (HYDRODIURIL) 25 MG tablet Take 1 tablet (25 mg total) by mouth daily. MUST SCHEDULE ANNUAL EXAM 90 tablet 3  . ibuprofen (ADVIL,MOTRIN) 200 MG tablet Take 400 mg by mouth every 4 (four) hours as needed for headache, mild pain or moderate pain.     Marland Kitchen ibuprofen (ADVIL,MOTRIN) 800 MG tablet Take 1 tablet (800 mg total) by mouth every 8 (eight) hours as needed. 30 tablet 0   No current facility-administered medications for this visit.     Allergies  Allergen Reactions  . Gabapentin Swelling    "makes me feel detached"  . Naproxen Palpitations  . Neulasta [Pegfilgrastim] Itching and Other (See Comments)    Patient stated,"reddish skin tone, body on fire, tingly tongue."  . Erythromycin Nausea And Vomiting  . Tramadol Hcl Other (See Comments)    Headaches    Family History  Adopted: Yes  Problem Relation Age of Onset  . Breast cancer Brother 1       Maternal half-brother. Currently 59.  Marland Kitchen BRCA 1/2 Neg Hx     Social History   Socioeconomic History  . Marital status: Married    Spouse name: Not on file  . Number of  children: 4  . Years of education: Not on file  . Highest education level: Not on file  Occupational History  . Not on file  Social Needs  . Financial resource strain: Not on file  . Food insecurity:    Worry: Not on file    Inability: Not on file  . Transportation needs:    Medical: Not on file    Non-medical: Not on file  Tobacco Use  . Smoking status: Former Smoker    Packs/day: 0.25    Types: Cigarettes    Last attempt to quit: 01/13/2017    Years since quitting: 1.1  . Smokeless tobacco: Never Used  Substance and Sexual Activity  . Alcohol use: No    Alcohol/week: 0.0 oz  . Drug use: No  . Sexual activity: Yes    Birth control/protection: Post-menopausal  Lifestyle  . Physical  activity:    Days per week: Not on file    Minutes per session: Not on file  . Stress: Not on file  Relationships  . Social connections:    Talks on phone: Not on file    Gets together: Not on file    Attends religious service: Not on file    Active member of club or organization: Not on file    Attends meetings of clubs or organizations: Not on file    Relationship status: Not on file  . Intimate partner violence:    Fear of current or ex partner: Not on file    Emotionally abused: Not on file    Physically abused: Not on file    Forced sexual activity: Not on file  Other Topics Concern  . Not on file  Social History Narrative  . Not on file     Constitutional: Pt reports fatigue. Denies fever, malaise, headache or abrupt weight changes.  HRespiratory: Denies difficulty breathing, shortness of breath, cough or sputum production.   Cardiovascular: Denies chest pain, chest tightness, palpitations or swelling in the hands or feet.  Musculoskeletal: Pt reports chronic muscle pain, bilateral foot pain. Denies decrease in range of motion, difficulty with gait,  or joint  swelling.  Neurological: Pt reports intermittent dizziness. Denies difficulty with memory, difficulty with speech or problems with balance and coordination.    No other specific complaints in a complete review of systems (except as listed in HPI above).     Objective:   Physical Exam   BP 132/84   Pulse 84   Temp 97.9 F (36.6 C) (Oral)   Wt 228 lb (103.4 kg)   LMP 11/07/2016   SpO2 97%   BMI 39.14 kg/m  Wt Readings from Last 3 Encounters:  03/07/18 228 lb (103.4 kg)  02/26/18 224 lb 9.6 oz (101.9 kg)  02/14/18 224 lb (101.6 kg)    General: Appears her stated age, obese in NAD. Cardiovascular: Normal rate and rhythm.  Pulmonary/Chest: Normal effort and positive vesicular breath sounds. No respiratory distress. No wheezes, rales or ronchi noted.  Musculoskeletal: Strength 5/5 BUE/BLE. Multiple tender  points noted on upper back, arms and upper thighs. No difficulty with gait.  Neurological: Alert and oriented.    BMET    Component Value Date/Time   NA 132 (L) 12/31/2017 1256   NA 138 09/11/2017 0902   K 4.8 12/31/2017 1256   K 3.5 09/11/2017 0902   CL 99 12/31/2017 1256   CO2 26 12/31/2017 1256   CO2 25 09/11/2017 0902   GLUCOSE 97 12/31/2017 1256   GLUCOSE  98 09/11/2017 0902   BUN 16 12/31/2017 1256   BUN 18.2 09/11/2017 0902   CREATININE 0.90 02/26/2018 2128   CREATININE 1.0 09/11/2017 0902   CALCIUM 9.9 12/31/2017 1256   CALCIUM 9.4 09/11/2017 0902   GFRNONAA 49 (L) 10/18/2017 1538   GFRAA 57 (L) 10/18/2017 1538    Lipid Panel     Component Value Date/Time   CHOL 172 04/20/2016 1417   TRIG 127.0 04/20/2016 1417   HDL 59.10 04/20/2016 1417   CHOLHDL 3 04/20/2016 1417   VLDL 25.4 04/20/2016 1417   LDLCALC 88 04/20/2016 1417    CBC    Component Value Date/Time   WBC 11.0 (H) 12/31/2017 1256   RBC 4.87 12/31/2017 1256   HGB 14.5 12/31/2017 1256   HGB 13.0 09/11/2017 0902   HCT 42.8 12/31/2017 1256   HCT 38.6 09/11/2017 0902   PLT 298.0 12/31/2017 1256   PLT 263 09/11/2017 0902   MCV 88.0 12/31/2017 1256   MCV 88.7 09/11/2017 0902   MCH 30.4 10/18/2017 1538   MCHC 34.0 12/31/2017 1256   RDW 14.4 12/31/2017 1256   RDW 13.4 09/11/2017 0902   LYMPHSABS 1.9 12/31/2017 1256   LYMPHSABS 1.4 09/11/2017 0902   MONOABS 0.6 12/31/2017 1256   MONOABS 0.8 09/11/2017 0902   EOSABS 0.1 12/31/2017 1256   EOSABS 0.2 09/11/2017 0902   BASOSABS 0.1 12/31/2017 1256   BASOSABS 0.0 09/11/2017 0902    Hgb A1C Lab Results  Component Value Date   HGBA1C 5.6 04/20/2016           Assessment & Plan:   Fibromyalgia, Vertigo, Difficulty Staying Awake:  No OSA Will write letter as requested- copy in Epic Encouraged regular physical exercise to boost metabolism and increase endorphins for energy  Return precautions discussed Webb Silversmith, NP

## 2018-03-09 NOTE — Progress Notes (Signed)
Subjective: 57 year old female presents the office for follow-up evaluation of bilateral arch pain as well as heel pain, plantar fasciitis.  She states that ibuprofen has not been helpful she continues to get pain.  The plantar fascial braces were helping at first however after couple days there were no longer effective.  She denies any recent injury or trauma.  Denies any increase in swelling or redness.  She has no other concerns identified at this time. Denies any systemic complaints such as fevers, chills, nausea, vomiting. No acute changes since last appointment, and no other complaints at this time.   Objective: AAO x3, NAD DP/PT pulses palpable bilaterally, CRT less than 3 seconds There is continuation of tenderness palpation on the plantar medial tubercle of the calcaneus at the insertion of the plantar fascia on the right foot.  Mild discomfort on the medial band plantar fascial the arch of the foot bilaterally.  Mild midfoot discomfort but there is no specific area pinpoint bony tenderness or pain to vibratory sensation.  Achilles tendon, plantar fascia appear to be intact. No open lesions or pre-ulcerative lesions.  No pain with calf compression, swelling, warmth, erythema  Assessment: Bilateral foot pain, plantar fasciitis; midfoot pain  Plan: -All treatment options discussed with the patient including all alternatives, risks, complications.  -Discussed a steroid injection on the right side and wishes to proceed.  See procedure note below. -Voltaren gel ordered -Continue stretching, icing daily. -Plantar fascial tapings were applied. -Discussed orthotics and shoe modifications as well. -Patient encouraged to call the office with any questions, concerns, change in symptoms.   Procedure: Injection Tendon/Ligament Discussed alternatives, risks, complications and verbal consent was obtained.  Location: Right plantar fascia at the glabrous junction; medial approach. Skin Prep:  Alcohol. Injectate: 0.5cc 0.5% marcaine plain, 0.5 cc 2% lidocaine plain and, 1 cc kenalog 10. Disposition: Patient tolerated procedure well. Injection site dressed with a band-aid.  Post-injection care was discussed and return precautions discussed.   Trula Slade DPM

## 2018-03-21 ENCOUNTER — Encounter: Payer: BLUE CROSS/BLUE SHIELD | Admitting: Psychology

## 2018-03-28 ENCOUNTER — Ambulatory Visit: Payer: BLUE CROSS/BLUE SHIELD | Admitting: Podiatry

## 2018-03-28 ENCOUNTER — Ambulatory Visit: Payer: BLUE CROSS/BLUE SHIELD | Admitting: Physical Medicine & Rehabilitation

## 2018-03-28 DIAGNOSIS — M722 Plantar fascial fibromatosis: Secondary | ICD-10-CM

## 2018-03-28 MED ORDER — IBUPROFEN 800 MG PO TABS: 800.0000 mg | ORAL_TABLET | Freq: Three times a day (TID) | ORAL | 1 refills | Status: DC | PRN

## 2018-03-31 NOTE — Progress Notes (Signed)
Subjective: 57 year old female presents the office today for follow-up evaluation of bilateral foot pain from plantar fasciitis.  She says letter is doing very well but she is having pain on the right side.  The shots that helped for a couple of days.  Denies any recent injury or trauma.  She also has pain in the arch of her foot at times.  No other changes. Denies any systemic complaints such as fevers, chills, nausea, vomiting. No acute changes since last appointment, and no other complaints at this time.   Objective: AAO x3, NAD DP/PT pulses palpable bilaterally, CRT less than 3 seconds On the right foot there is tenderness palpation on the plantar medial tubercle of the calcaneus at the insertion of plantar fascia.  There is no pain on the plantar aspect of the left heel.  There is minimal arch pain on the left side but there is some pain on the arch of the right foot.  Achilles tendon appears to be intact.  No edema, erythema.  No other areas of tenderness. No open lesions or pre-ulcerative lesions.  No pain with calf compression, swelling, warmth, erythema  Assessment: Bilateral plantar fasciitis right side worse than left  Plan: -All treatment options discussed with the patient including all alternatives, risks, complications.  -Steroid injection performed in the right side.  See procedure note below.  Discussed stretching, icing exercises again today.  Discussed shoe modifications and orthotics.  Check orthotic coverage for her today. -Patient encouraged to call the office with any questions, concerns, change in symptoms.   Procedure: Injection Tendon/Ligament Discussed alternatives, risks, complications and verbal consent was obtained.  Location: Right plantar fascia at the glabrous junction; medial approach. Skin Prep: Alcohol. Injectate: 0.5cc 0.5% marcaine plain, 0.5 cc 2% lidocaine plain and, 1 cc kenalog 10. Disposition: Patient tolerated procedure well. Injection site dressed  with a band-aid.  Post-injection care was discussed and return precautions discussed.   Stacy [name] DPM

## 2018-04-04 ENCOUNTER — Ambulatory Visit: Payer: BLUE CROSS/BLUE SHIELD | Admitting: Psychology

## 2018-04-11 ENCOUNTER — Other Ambulatory Visit: Payer: Self-pay | Admitting: Podiatry

## 2018-04-17 ENCOUNTER — Encounter: Payer: Self-pay | Admitting: Internal Medicine

## 2018-04-18 ENCOUNTER — Ambulatory Visit: Payer: BLUE CROSS/BLUE SHIELD | Admitting: Podiatry

## 2018-04-18 ENCOUNTER — Telehealth: Payer: Self-pay | Admitting: Internal Medicine

## 2018-04-18 NOTE — Telephone Encounter (Signed)
Done, given back to [name] 

## 2018-04-18 NOTE — Telephone Encounter (Signed)
Paperwork in [facility] in box

## 2018-04-21 NOTE — Telephone Encounter (Signed)
Paperwork faxed Sent pt my chart message letting her know Copy for pt Copy for scan

## 2018-04-22 ENCOUNTER — Ambulatory Visit (INDEPENDENT_AMBULATORY_CARE_PROVIDER_SITE_OTHER): Payer: BLUE CROSS/BLUE SHIELD | Admitting: Podiatry

## 2018-04-22 ENCOUNTER — Encounter: Payer: Self-pay | Admitting: Podiatry

## 2018-04-22 DIAGNOSIS — M722 Plantar fascial fibromatosis: Secondary | ICD-10-CM

## 2018-04-22 NOTE — Patient Instructions (Signed)

## 2018-04-22 NOTE — Progress Notes (Signed)
Patient presents to the office today to [facility]. Was seen by Cranford Mon, CMA. Oral and written instructions were discussed. Declined appointment with myself. Follow-up in 4 weeks or sooner if needed.   Patient did report improvement in pain since the last injection.

## 2018-04-25 ENCOUNTER — Encounter: Payer: Self-pay | Admitting: Internal Medicine

## 2018-04-25 NOTE — Telephone Encounter (Signed)
Please estimate time for follow up  Question 12

## 2018-04-28 ENCOUNTER — Encounter: Payer: Self-pay | Admitting: Hematology and Oncology

## 2018-04-29 ENCOUNTER — Other Ambulatory Visit: Payer: Self-pay

## 2018-04-29 DIAGNOSIS — H8110 Benign paroxysmal vertigo, unspecified ear: Secondary | ICD-10-CM

## 2018-04-29 NOTE — Progress Notes (Signed)
ent

## 2018-05-20 ENCOUNTER — Ambulatory Visit: Payer: BLUE CROSS/BLUE SHIELD | Admitting: Podiatry

## 2018-05-28 ENCOUNTER — Encounter: Payer: Self-pay | Admitting: Hematology and Oncology

## 2018-05-28 NOTE — Progress Notes (Addendum)
Pt still experiencing vertigo frequently. Per pt, she has not heard from physical therapy. Was wondering if ENT would be best for her. Will follow up with neurorehab pt first, prior to following up with ENT referral.

## 2018-06-02 ENCOUNTER — Ambulatory Visit: Payer: BLUE CROSS/BLUE SHIELD | Admitting: Physical Therapy

## 2018-06-02 DIAGNOSIS — Z0000 Encounter for general adult medical examination without abnormal findings: Secondary | ICD-10-CM | POA: Diagnosis not present

## 2018-06-05 ENCOUNTER — Encounter: Payer: Self-pay | Admitting: Physical Therapy

## 2018-06-05 ENCOUNTER — Other Ambulatory Visit: Payer: Self-pay

## 2018-06-05 ENCOUNTER — Ambulatory Visit: Payer: BLUE CROSS/BLUE SHIELD | Attending: Hematology and Oncology | Admitting: Physical Therapy

## 2018-06-05 DIAGNOSIS — R2681 Unsteadiness on feet: Secondary | ICD-10-CM | POA: Diagnosis not present

## 2018-06-05 DIAGNOSIS — R262 Difficulty in walking, not elsewhere classified: Secondary | ICD-10-CM | POA: Diagnosis not present

## 2018-06-05 DIAGNOSIS — R42 Dizziness and giddiness: Secondary | ICD-10-CM | POA: Insufficient documentation

## 2018-06-05 DIAGNOSIS — H8113 Benign paroxysmal vertigo, bilateral: Secondary | ICD-10-CM

## 2018-06-05 NOTE — Therapy (Signed)
[facility] [address] [place], [place], [address] Phone: [phone]   Fax:  [phone]  Physical Therapy Evaluation  Patient Details  Name: Stacy [name] MRN: [id number] Date of Birth: Apr 08, 1961 Referring Provider: Nicholas Lose, MD   Encounter Date: 06/05/2018  PT End of Session - 06/05/18 0843    Visit Number  1    Number of Visits  5    Date for PT Re-Evaluation  07/05/18    Authorization Type  BCBS; 60 VL (PT and OT; zero used)    Authorization - Visit Number  1    Authorization - Number of Visits  82    PT Start Time  0355    PT Stop Time  [phone]    PT Time Calculation (min)  41 min    Activity Tolerance  Patient tolerated treatment well    Behavior During Therapy  [facility] for tasks assessed/performed       Past Medical History:  Diagnosis Date  . Anxiety   . Arthritis   . Complication of anesthesia    DIFFICULTY WAKING UP , LAST SURGERY NECK 2012 WAS FINE PER PT HERE AT [facility]  . Depression   . Fibromyalgia   . Genetic testing 12/21/2016   Genetic testing reported out on 12/20/2016 through [facility]'s 46-gene Common Hereditary Cancers panel found no deleterious mutations. The following 46 genes were analyzed: APC, ATM, AXIN2, BARD1, BMPR1A, BRCA1, BRCA2, BRIP1, CDH1, CDKN2A, CHEK2, CTNNA1, DICER1, EPCAM, GREM1, KIT, MEN1, MLH1, MSH2, MSH3, MSH6, MUTYH, NBN, NF1, NTHL1, PALB2, PDGFRA, PMS2, POLD1, POLE, PTEN, RAD50, RAD51C, RAD51D, SDH  . Headache    HX MIGRAINES    . Heart murmur   . History of radiation therapy 02/14/17-04/03/2017   left breast 60.4 Gy: 50.4 Gy delivered in 28 fractions and a boost of 10 Gy delivered in 5 fractions.  . Hypertension   . Malignant neoplasm of upper-outer quadrant of left female breast ([place]) 08/23/2016  . MVP (mitral valve prolapse)    AS INFANT  . Neck pain   . Personal history of chemotherapy   . Personal history of radiation therapy   . Vision abnormalities     Past  Surgical History:  Procedure Laterality Date  . cervical fusiion  2012  . JOINT REPLACEMENT    . MITRAL VALVE REPAIR     MVP 1970  . PORT-A-CATH REMOVAL N/A 10/23/2017   Procedure: REMOVAL PORT-A-CATH;  Surgeon: [name], MD;  Location: [place];  Service: General;  Laterality: N/A;  . PORTACATH PLACEMENT Right 09/06/2016   Procedure: INSERTION PORT-A-CATH;  Surgeon: [name], MD;  Location: [place];  Service: General;  Laterality: Right;  . RADIOACTIVE SEED GUIDED PARTIAL MASTECTOMY WITH AXILLARY SENTINEL LYMPH NODE BIOPSY Left 09/06/2016   Procedure: RADIOACTIVE SEED GUIDED LEFT BREAST LUMPECTOMY  WITH SENTINEL LYMPH NODE BIOPSY;  Surgeon: [name], MD;  Location: [place];  Service: General;  Laterality: Left;, Per patient just "lumpectomy"  . ROTATOR CUFF REPAIR Right 2012  . TOTAL HIP ARTHROPLASTY Right 09/14/2015   Procedure: RIGHT TOTAL HIP ARTHROPLASTY ANTERIOR APPROACH;  Surgeon: Leandrew Koyanagi, MD;  Location: [facility];  Service: Orthopedics;  Laterality: Right;  . TUBAL LIGATION  1988    There were no vitals filed for this visit.   Subjective Assessment - 06/05/18 0759    Subjective  Started a long time ago, probably after the 3rd chemo treatment (March 2018). It happens primarily when getting up  from a sitting position, the world gets wobbly and I have to hold onto something  and wait for it to pass.     Pertinent History  anxiety, arthritis, fibromyalgia, migraines, left breast Ca with chemotherapy and radiation, HTN, Rt THR, cervical fusion, rt rotator cuff repair    Patient Stated Goals  stop feeling dizzy; stop falling    Currently in Pain?  No/denies         [facility] PT Assessment - 06/05/18 0801      Assessment   Medical Diagnosis  Dizziness ?BPPV    Referring Provider  Nicholas Lose, MD    Onset Date/Surgical Date  --   March 2018   Prior Therapy  none      Precautions   Precautions  Fall      Restrictions   Weight  Bearing Restrictions  No      Balance Screen   Has the patient fallen in the past 6 months  Yes    How many times?  2   dizzy, offbalance falls   Has the patient had a decrease in activity level because of a fear of falling?   Yes   uses drivethru, calls ahead order   Is the patient reluctant to leave their home because of a fear of falling?   Yes      Observation/Other Assessments   Observations  becomes tearful discussing how this has impacted her life    Focus on Therapeutic Outcomes (FOTO)   not set-up           Vestibular Assessment - 06/05/18 0001      Symptom Behavior   Type of Dizziness  "World moves"   blurry vision, wobbly   Frequency of Dizziness  every day    Duration of Dizziness  varies, some are 1-2 minutes, mostly less than a minute    Aggravating Factors  Sit to stand;Turning head quickly;Turning body quickly    Relieving Factors  Rest;Head stationary   stop and wait it out; no change with eyes closed (still spin     Occulomotor Exam   Occulomotor Alignment  Normal    Spontaneous  Absent    Gaze-induced  Absent    Smooth Pursuits  Intact   causes mild symptoms   Saccades  Intact   caused symptoms     Positional Testing   Dix-Hallpike  Dix-Hallpike Right;Dix-Hallpike Left    Horizontal Canal Testing  Horizontal Canal Right;Horizontal Canal Left      Dix-Hallpike Left   Dix-Hallpike Left Duration  25 sec    Dix-Hallpike Left Symptoms  No nystagmus   spinning     Horizontal Canal Right   Horizontal Canal Right Duration  0    Horizontal Canal Right Symptoms  Normal      Horizontal Canal Left   Horizontal Canal Left Duration  0    Horizontal Canal Left Symptoms  Normal          Objective measurements completed on examination: See above findings.       Vestibular Treatment/Exercise - 06/05/18 0001      Vestibular Treatment/Exercise   Vestibular Treatment Provided  Canalith Repositioning    Canalith Repositioning  Epley Manuever Left     Habituation Exercises  Brandt Daroff       EPLEY MANUEVER LEFT   Number of Reps   2    Overall Response   Symptoms Resolved     RESPONSE DETAILS LEFT  !st rep +symptoms each step;  2nd rep very brief mild symptom when 1st laid back, no symptoms with each othrer step      Nestor Lewandowsky   Symptom Description   educated but did not have her do this; to begin tomorrow if still having symptoms            PT Education - 06/05/18 1258    Education Details  what is BPPV, how maneuvers can help, how Brandt-Daroff can help    Person(s) Educated  Patient    Methods  Explanation;Handout    Comprehension  Verbalized understanding          PT Long Term Goals - 06/05/18 1915      PT LONG TERM GOAL #1   Title  Patient will have negative positional testing for BPPV (Target for all LTGs 07/05/2018)    Time  4    Period  Weeks    Status  New    Target Date  07/05/18      PT LONG TERM GOAL #2   Title  If after resolution of BPPV, pt continues to have decr balance and dizziness, patient will participate in further vestibular and balance evaluation.     Time  4    Period  Weeks    Status  New      PT LONG TERM GOAL #3   Title  Patient will be independent with HEP to address balance and vestibular deficits.     Time  4    Period  Weeks    Status  New      PT LONG TERM GOAL #4   Title  Patient will be able to verbalize process for obtaining referral to return to PT if BPPV recurrs.     Time  4    Period  Weeks    Status  New             Plan - 06/05/18 1259    Clinical Impression Statement  Patient referred to OPPT due to prolonged history of dizziness with negative MRI of brain. Patient has had multiple falls and has had to limit her activity due to a fear of falling and injuring herself. She tested +for left posterior canalithiasis. She did not have nystagmus, however with the length of time she has had symptoms, she likely has learned to fixate her eyes to suppress the  nystagmus. She responded well to Dix-Hallpike and symptoms were resolved by end of session. Patient may need further cannalith repositioning, or may have developed a peripheral vestibular hypofunction due to prolonged BPPV and therefore could benefit from further PT assessment and vestibular treatment. PT to address the deficits listed below via the interventions listed below.    History and Personal Factors relevant to plan of care:  PMH-anxiety, arthritis, fibromyalgia, migraines, left breast Ca with chemotherapy and radiation, HTN, Rt THR, cervical fusion, rt rotator cuff repair    Clinical Presentation  Unstable    Clinical Presentation due to:  + falls due to dizziness and multiple near falls    Clinical Decision Making  Low    Rehab Potential  Good    Clinical Impairments Affecting Rehab Potential  neuropathy bil LEs due to chemotherapy also can decr balance    PT Frequency  1x / week    PT Duration  4 weeks    PT Treatment/Interventions  ADLs/Self Care Home Management;Canalith Repostioning;Gait training;Stair training;Functional mobility training;Therapeutic activities;Therapeutic exercise;Balance training;Neuromuscular re-education;Patient/family education;Manual techniques;Vestibular;Visual/perceptual remediation/compensation    PT Next Visit Plan  reassess for left posterior canal BPPV; check for hypofunction; initiate HEP/VOR x1 if needed    PT Home Exercise Plan  given Brandt-Daroff    Consulted and Agree with Plan of Care  Patient       Patient will benefit from skilled therapeutic intervention in order to improve the following deficits and impairments:  Decreased activity tolerance, Decreased balance, Decreased mobility, Dizziness, Difficulty walking  Visit Diagnosis: Difficulty in walking, not elsewhere classified - Plan: PT plan of care cert/re-cert  BPPV (benign paroxysmal positional vertigo), bilateral - Plan: PT plan of care cert/re-cert  Dizziness and giddiness - Plan: PT  plan of care cert/re-cert  Unsteadiness on feet - Plan: PT plan of care cert/re-cert     Problem List Patient Active Problem List   Diagnosis Date Noted  . Plantar fasciitis 02/21/2018  . Neuropathy due to chemotherapeutic drug ([place]) 01/16/2018  . Port catheter in place 10/09/2016  . Malignant neoplasm of upper-outer quadrant of left female breast ([place]) 08/23/2016  . Anxiety and depression 04/20/2016  . Arthritis 06/27/2015  . Essential hypertension 04/19/2009  . Fibromyalgia 02/18/2008  . MITRAL VALVE PROLAPSE, HX OF 02/18/2008    Rexanne Mano, PT 06/05/2018, 7:23 PM  [facility] [address] [place], [place], [address] Phone: [phone]   Fax:  [phone]  Name: Stacy [name] MRN: [id number] Date of Birth: May 28, 1961

## 2018-06-05 NOTE — Patient Instructions (Signed)
  Patient given handout and reviewed how to do Brandt-Daroff exercise

## 2018-06-09 ENCOUNTER — Ambulatory Visit: Payer: BLUE CROSS/BLUE SHIELD | Admitting: Physical Therapy

## 2018-07-01 ENCOUNTER — Other Ambulatory Visit: Payer: Self-pay | Admitting: Internal Medicine

## 2018-07-01 ENCOUNTER — Other Ambulatory Visit: Payer: Self-pay | Admitting: Hematology and Oncology

## 2018-07-01 DIAGNOSIS — Z9889 Other specified postprocedural states: Secondary | ICD-10-CM

## 2018-07-15 ENCOUNTER — Encounter: Payer: Self-pay | Admitting: Internal Medicine

## 2018-07-16 ENCOUNTER — Encounter: Payer: Self-pay | Admitting: Internal Medicine

## 2018-07-16 ENCOUNTER — Ambulatory Visit: Payer: BLUE CROSS/BLUE SHIELD | Admitting: Internal Medicine

## 2018-07-16 VITALS — BP 130/80 | HR 87 | Temp 98.3°F | Wt 231.0 lb

## 2018-07-16 DIAGNOSIS — J00 Acute nasopharyngitis [common cold]: Secondary | ICD-10-CM | POA: Diagnosis not present

## 2018-07-16 MED ORDER — AMOXICILLIN 875 MG PO TABS: 875.0000 mg | ORAL_TABLET | Freq: Two times a day (BID) | ORAL | 0 refills | Status: DC

## 2018-07-16 MED ORDER — HYDROCOD POLST-CPM POLST ER 10-8 MG/5ML PO SUER: 5.0000 mL | Freq: Every evening | ORAL | 0 refills | Status: DC | PRN

## 2018-07-16 NOTE — Patient Instructions (Signed)
Upper Respiratory Infection, Adult Most upper respiratory infections (URIs) are caused by a virus. A URI affects the nose, throat, and upper air passages. The most common type of URI is often called "the common cold." Follow these instructions at home:  Take medicines only as told by your doctor.  Gargle warm saltwater or take cough drops to comfort your throat as told by your doctor.  Use a warm mist humidifier or inhale steam from a shower to increase air moisture. This may make it easier to breathe.  Drink enough fluid to keep your pee (urine) clear or pale yellow.  Eat soups and other clear broths.  Have a healthy diet.  Rest as needed.  Go back to work when your fever is gone or your doctor says it is okay. ? You may need to stay home longer to avoid giving your URI to others. ? You can also wear a face mask and wash your hands often to prevent spread of the virus.  Use your inhaler more if you have asthma.  Do not use any tobacco products, including cigarettes, chewing tobacco, or electronic cigarettes. If you need help quitting, ask your doctor. Contact a doctor if:  You are getting worse, not better.  Your symptoms are not helped by medicine.  You have chills.  You are getting more short of breath.  You have Brian or red mucus.  You have yellow or Pauling discharge from your nose.  You have pain in your face, especially when you bend forward.  You have a fever.  You have puffy (swollen) neck glands.  You have pain while swallowing.  You have white areas in the back of your throat. Get help right away if:  You have very bad or constant: ? Headache. ? Ear pain. ? Pain in your forehead, behind your eyes, and over your cheekbones (sinus pain). ? Chest pain.  You have long-lasting (chronic) lung disease and any of the following: ? Wheezing. ? Long-lasting cough. ? Coughing up blood. ? A change in your usual mucus.  You have a stiff neck.  You have  changes in your: ? Vision. ? Hearing. ? Thinking. ? Mood. This information is not intended to replace advice given to you by your health care provider. Make sure you discuss any questions you have with your health care provider. Document Released: 02/20/2008 Document Revised: 05/06/2016 Document Reviewed: 12/09/2013 Elsevier Interactive Patient Education  2018 Elsevier Inc.  

## 2018-07-16 NOTE — Progress Notes (Signed)
HPI  Pt presents to the clinic today with c/o headache and cough. She reports this started 5-6 days ago. She describes the headache as pressure located in her temptles. The cough is nonproductive. She reports associated nausea. She denies runny nose, nasal congestion, ear pain, sore throat, shortness of breath or vomiting. She denies fever or chills but has had body aches. She has tried Ibuprofen and Nyquil with minimal relief. She has no history of allergies or breathing problems. She has had sick contacts.  Review of Systems      Past Medical History:  Diagnosis Date  . Anxiety   . Arthritis   . Complication of anesthesia    DIFFICULTY WAKING UP , LAST SURGERY NECK 2012 WAS FINE PER PT HERE AT [facility]  . Depression   . Fibromyalgia   . Genetic testing 12/21/2016   Genetic testing reported out on 12/20/2016 through [facility]'s 46-gene Common Hereditary Cancers panel found no deleterious mutations. The following 46 genes were analyzed: APC, ATM, AXIN2, BARD1, BMPR1A, BRCA1, BRCA2, BRIP1, CDH1, CDKN2A, CHEK2, CTNNA1, DICER1, EPCAM, GREM1, KIT, MEN1, MLH1, MSH2, MSH3, MSH6, MUTYH, NBN, NF1, NTHL1, PALB2, PDGFRA, PMS2, POLD1, POLE, PTEN, RAD50, RAD51C, RAD51D, SDH  . Headache    HX MIGRAINES    . Heart murmur   . History of radiation therapy 02/14/17-04/03/2017   left breast 60.4 Gy: 50.4 Gy delivered in 28 fractions and a boost of 10 Gy delivered in 5 fractions.  . Hypertension   . Malignant neoplasm of upper-outer quadrant of left female breast ([place]) 08/23/2016  . MVP (mitral valve prolapse)    AS INFANT  . Neck pain   . Personal history of chemotherapy   . Personal history of radiation therapy   . Vision abnormalities     Family History  Adopted: Yes  Problem Relation Age of Onset  . Breast cancer Brother 14       Maternal half-brother. Currently 2.  Marland Kitchen BRCA 1/2 Neg Hx     Social History   Socioeconomic History  . Marital status: Married    Spouse name: Not on file  . Number of  children: 4  . Years of education: Not on file  . Highest education level: Not on file  Occupational History  . Not on file  Social Needs  . Financial resource strain: Not on file  . Food insecurity:    Worry: Not on file    Inability: Not on file  . Transportation needs:    Medical: Not on file    Non-medical: Not on file  Tobacco Use  . Smoking status: Former Smoker    Packs/day: 0.25    Types: Cigarettes    Last attempt to quit: 01/13/2017    Years since quitting: 1.5  . Smokeless tobacco: Never Used  Substance and Sexual Activity  . Alcohol use: No    Alcohol/week: 0.0 standard drinks  . Drug use: No  . Sexual activity: Yes    Birth control/protection: Post-menopausal  Lifestyle  . Physical activity:    Days per week: Not on file    Minutes per session: Not on file  . Stress: Not on file  Relationships  . Social connections:    Talks on phone: Not on file    Gets together: Not on file    Attends religious service: Not on file    Active member of club or organization: Not on file    Attends meetings of clubs or organizations: Not on file  Relationship status: Not on file  . Intimate partner violence:    Fear of current or ex partner: Not on file    Emotionally abused: Not on file    Physically abused: Not on file    Forced sexual activity: Not on file  Other Topics Concern  . Not on file  Social History Narrative  . Not on file    Allergies  Allergen Reactions  . Gabapentin Swelling    "makes me feel detached"  . Naproxen Palpitations  . Neulasta [Pegfilgrastim] Itching and Other (See Comments)    Patient stated,"reddish skin tone, body on fire, tingly tongue."  . Erythromycin Nausea And Vomiting  . Tramadol Hcl Other (See Comments)    Headaches     Constitutional: Positive headache, fatigue. Denies fever or abrupt weight changes.  HEENT:  Denies eye redness, eye pain, pressure behind the eyes, facial pain, nasal congestion, ear pain, ringing in the  ears, wax buildup, runny nose or bloody nose. Respiratory: Positive cough. Denies difficulty breathing or shortness of breath.  Cardiovascular: Denies chest pain, chest tightness, palpitations or swelling in the hands or feet.   No other specific complaints in a complete review of systems (except as listed in HPI above).  Objective:   BP 130/80   Pulse 87   Temp 98.3 F (36.8 C) (Oral)   Wt 231 lb (104.8 kg)   LMP 11/07/2016   SpO2 98%   BMI 39.65 kg/m   Wt Readings from Last 3 Encounters:  03/07/18 228 lb (103.4 kg)  02/26/18 224 lb 9.6 oz (101.9 kg)  02/14/18 224 lb (101.6 kg)     General: Appears her stated age, ill appearing, in NAD. HEENT: Head: normal shape and size, no sinus tenderness noted; Ears: Tm's gray and intact, normal light reflex; Nose: mucosa pink and moist; Throat/Mouth: + PND. Teeth present, mucosa pink and moist, no exudate noted, no lesions or ulcerations noted.  Neck: No cervical lymphadenopathy.  Cardiovascular: Normal rate and rhythm. S1,S2 noted.  No murmur, rubs or gallops noted.  Pulmonary/Chest: Normal effort and positive vesicular breath sounds. No respiratory distress. No wheezes, rales or ronchi noted.       Assessment & Plan:   Upper Respiratory Infection:  Get some rest and drink plenty of water eRx for Amoxil BID x 7 days eRx for Tussionex cough syrup  RTC as needed or if symptoms persist.   Webb Silversmith, NP

## 2018-07-30 ENCOUNTER — Other Ambulatory Visit: Payer: Self-pay | Admitting: Internal Medicine

## 2018-07-31 ENCOUNTER — Other Ambulatory Visit: Payer: Self-pay | Admitting: Internal Medicine

## 2018-07-31 ENCOUNTER — Encounter: Payer: Self-pay | Admitting: Hematology and Oncology

## 2018-08-01 ENCOUNTER — Encounter: Payer: Self-pay | Admitting: Internal Medicine

## 2018-08-01 ENCOUNTER — Other Ambulatory Visit: Payer: Self-pay

## 2018-08-01 DIAGNOSIS — C50412 Malignant neoplasm of upper-outer quadrant of left female breast: Secondary | ICD-10-CM

## 2018-08-01 MED ORDER — HYDROCHLOROTHIAZIDE 25 MG PO TABS: 25.0000 mg | ORAL_TABLET | Freq: Every day | ORAL | 0 refills | Status: DC

## 2018-08-01 NOTE — Progress Notes (Signed)
Called pt, in response to mychart message regarding pt worsening left breast pain. Pt noticed pain in breast/sternum area for a few weeks now. She is scheduled for MM in 2 weeks, but pain has worsened over the last few days. Denies any recent physical strain that could have caused muscular pain around her breast. Pt also noted a hard knot around medial part of her left breast, but unsure if its scar tissue or something new. Pt reports "nagging dry cough x4 weeks". Denies sob or productive cough at this time.   Told pt that Dr.Gudena is out of the office today but will discuss when he comes back next week. Pt does take ibuprofen for pain control, but breast pain worsens with standing straight up. Will call [facility] if MM could be moved up sooner, and added breast ultrasound as well with Dr.Gudena approval on Monday. Pt verbalized understanding.  0415pm- Spoke with [facility] and was able to reschedule pt to an earlier appt for next week 11/20 at 210pm. Pt aware and was very appreciative for accommodating the schedule.

## 2018-08-06 ENCOUNTER — Ambulatory Visit
Admission: RE | Admit: 2018-08-06 | Discharge: 2018-08-06 | Disposition: A | Discharge status: Home / Self Care | Payer: BLUE CROSS/BLUE SHIELD | Source: Ambulatory Visit | Attending: Hematology and Oncology | Admitting: Hematology and Oncology

## 2018-08-06 ENCOUNTER — Ambulatory Visit: Payer: BLUE CROSS/BLUE SHIELD

## 2018-08-06 DIAGNOSIS — Z9889 Other specified postprocedural states: Secondary | ICD-10-CM

## 2018-08-06 DIAGNOSIS — Z853 Personal history of malignant neoplasm of breast: Secondary | ICD-10-CM | POA: Diagnosis not present

## 2018-08-06 DIAGNOSIS — R928 Other abnormal and inconclusive findings on diagnostic imaging of breast: Secondary | ICD-10-CM | POA: Diagnosis not present

## 2018-08-07 ENCOUNTER — Encounter: Payer: Self-pay | Admitting: Internal Medicine

## 2018-08-07 ENCOUNTER — Telehealth: Payer: Self-pay | Admitting: Internal Medicine

## 2018-08-07 NOTE — Telephone Encounter (Signed)
Paperwork faxed °

## 2018-08-07 NOTE — Telephone Encounter (Signed)
Done, given back to [name] 

## 2018-08-07 NOTE — Telephone Encounter (Signed)
fmla in Stacy [name]'s in box for review and signature 

## 2018-08-08 NOTE — Telephone Encounter (Signed)
Pt aware  °Copy for pt °Copy for scan °

## 2018-09-26 ENCOUNTER — Encounter: Payer: BLUE CROSS/BLUE SHIELD | Admitting: Internal Medicine

## 2018-10-09 ENCOUNTER — Other Ambulatory Visit: Payer: Self-pay | Admitting: Nurse Practitioner

## 2018-10-11 ENCOUNTER — Other Ambulatory Visit: Payer: Self-pay | Admitting: Internal Medicine

## 2018-10-15 MED ORDER — HYDROCHLOROTHIAZIDE 25 MG PO TABS: 25.0000 mg | ORAL_TABLET | Freq: Every day | ORAL | 0 refills | Status: DC

## 2018-11-01 ENCOUNTER — Encounter (HOSPITAL_COMMUNITY): Payer: Self-pay | Admitting: Emergency Medicine

## 2018-11-01 ENCOUNTER — Other Ambulatory Visit: Payer: Self-pay

## 2018-11-01 ENCOUNTER — Emergency Department (HOSPITAL_COMMUNITY)
Admission: EM | Admit: 2018-11-01 | Discharge: 2018-11-01 | Disposition: A | Discharge status: Home / Self Care | Payer: BLUE CROSS/BLUE SHIELD | Attending: Emergency Medicine | Admitting: Emergency Medicine

## 2018-11-01 DIAGNOSIS — Z859 Personal history of malignant neoplasm, unspecified: Secondary | ICD-10-CM | POA: Diagnosis not present

## 2018-11-01 DIAGNOSIS — I10 Essential (primary) hypertension: Secondary | ICD-10-CM | POA: Diagnosis not present

## 2018-11-01 DIAGNOSIS — R1013 Epigastric pain: Secondary | ICD-10-CM

## 2018-11-01 DIAGNOSIS — K2900 Acute gastritis without bleeding: Secondary | ICD-10-CM | POA: Insufficient documentation

## 2018-11-01 HISTORY — DX: Malignant (primary) neoplasm, unspecified: C80.1

## 2018-11-01 HISTORY — DX: Rheumatic mitral valve disease, unspecified: I05.9

## 2018-11-01 LAB — URINALYSIS, ROUTINE W REFLEX MICROSCOPIC
Bilirubin Urine: NEGATIVE
Glucose, UA: NEGATIVE mg/dL
Ketones, ur: NEGATIVE mg/dL
Leukocytes,Ua: NEGATIVE
Nitrite: NEGATIVE
Protein, ur: NEGATIVE mg/dL
Specific Gravity, Urine: 1.011 (ref 1.005–1.030)
pH: 6 (ref 5.0–8.0)

## 2018-11-01 LAB — CBC WITH DIFFERENTIAL/PLATELET
Abs Immature Granulocytes: 0.13 10*3/uL — ABNORMAL HIGH (ref 0.00–0.07)
Basophils Absolute: 0.1 10*3/uL (ref 0.0–0.1)
Basophils Relative: 0 %
Eosinophils Absolute: 0.1 10*3/uL (ref 0.0–0.5)
Eosinophils Relative: 1 %
HCT: 44 % (ref 36.0–46.0)
Hemoglobin: 14.5 g/dL (ref 12.0–15.0)
Immature Granulocytes: 1 %
Lymphocytes Relative: 20 %
Lymphs Abs: 2.8 10*3/uL (ref 0.7–4.0)
MCH: 30.5 pg (ref 26.0–34.0)
MCHC: 33 g/dL (ref 30.0–36.0)
MCV: 92.6 fL (ref 80.0–100.0)
Monocytes Absolute: 1.3 10*3/uL — ABNORMAL HIGH (ref 0.1–1.0)
Monocytes Relative: 9 %
Neutro Abs: 9.8 10*3/uL — ABNORMAL HIGH (ref 1.7–7.7)
Neutrophils Relative %: 69 %
Platelets: 297 10*3/uL (ref 150–400)
RBC: 4.75 MIL/uL (ref 3.87–5.11)
RDW: 13.1 % (ref 11.5–15.5)
WBC: 14.1 10*3/uL — ABNORMAL HIGH (ref 4.0–10.5)
nRBC: 0 % (ref 0.0–0.2)

## 2018-11-01 LAB — COMPREHENSIVE METABOLIC PANEL
ALT: 19 U/L (ref 0–44)
AST: 20 U/L (ref 15–41)
Albumin: 4.6 g/dL (ref 3.5–5.0)
Alkaline Phosphatase: 64 U/L (ref 38–126)
Anion gap: 9 (ref 5–15)
BUN: 19 mg/dL (ref 6–20)
CO2: 24 mmol/L (ref 22–32)
Calcium: 9.1 mg/dL (ref 8.9–10.3)
Chloride: 102 mmol/L (ref 98–111)
Creatinine, Ser: 0.92 mg/dL (ref 0.44–1.00)
GFR calc Af Amer: >60 mL/min (ref >60)
GFR calc non Af Amer: >60 mL/min (ref >60)
Glucose, Bld: 109 mg/dL — ABNORMAL HIGH (ref 70–99)
Potassium: 3.8 mmol/L (ref 3.5–5.1)
Sodium: 135 mmol/L (ref 135–145)
Total Bilirubin: 0.6 mg/dL (ref 0.3–1.2)
Total Protein: 8.1 g/dL (ref 6.5–8.1)

## 2018-11-01 LAB — LIPASE, BLOOD: Lipase: 30 U/L (ref 11–51)

## 2018-11-01 MED ORDER — PROMETHAZINE HCL 25 MG/ML IJ SOLN
12.5000 mg | Freq: Once | INTRAMUSCULAR | Status: AC
  Administered 2018-11-01: 12.5 mg via INTRAVENOUS
  Filled 2018-11-01: qty 1

## 2018-11-01 MED ORDER — PANTOPRAZOLE SODIUM 20 MG PO TBEC: 20.0000 mg | DELAYED_RELEASE_TABLET | Freq: Two times a day (BID) | ORAL | 0 refills | Status: DC

## 2018-11-01 MED ORDER — LIDOCAINE VISCOUS HCL 2 % MT SOLN
15.0000 mL | Freq: Once | OROMUCOSAL | Status: AC
  Administered 2018-11-01: 15 mL via ORAL
  Filled 2018-11-01: qty 15

## 2018-11-01 MED ORDER — ONDANSETRON HCL 4 MG/2ML IJ SOLN
4.0000 mg | Freq: Once | INTRAMUSCULAR | Status: AC
  Administered 2018-11-01: 4 mg via INTRAVENOUS
  Filled 2018-11-01: qty 2

## 2018-11-01 MED ORDER — HYDROCODONE-ACETAMINOPHEN 5-325 MG PO TABS: 1.0000 | ORAL_TABLET | Freq: Four times a day (QID) | ORAL | 0 refills | Status: DC | PRN

## 2018-11-01 MED ORDER — ONDANSETRON HCL 4 MG/2ML IJ SOLN
4.0000 mg | Freq: Once | INTRAMUSCULAR | Status: DC
  Filled 2018-11-01: qty 2

## 2018-11-01 MED ORDER — FAMOTIDINE IN NACL 20-0.9 MG/50ML-% IV SOLN
20.0000 mg | Freq: Once | INTRAVENOUS | Status: AC
  Administered 2018-11-01: 20 mg via INTRAVENOUS
  Filled 2018-11-01: qty 50

## 2018-11-01 MED ORDER — HYDROMORPHONE HCL 1 MG/ML IJ SOLN
1.0000 mg | Freq: Once | INTRAMUSCULAR | Status: AC
  Administered 2018-11-01: 1 mg via INTRAVENOUS
  Filled 2018-11-01: qty 1

## 2018-11-01 MED ORDER — ALUM & MAG HYDROXIDE-SIMETH 200-200-20 MG/5ML PO SUSP
30.0000 mL | Freq: Once | ORAL | Status: AC
  Administered 2018-11-01: 30 mL via ORAL
  Filled 2018-11-01: qty 30

## 2018-11-01 MED ORDER — ONDANSETRON HCL 4 MG PO TABS: 4.0000 mg | ORAL_TABLET | Freq: Four times a day (QID) | ORAL | 0 refills | Status: DC | PRN

## 2018-11-01 MED ORDER — PANTOPRAZOLE SODIUM 40 MG IV SOLR
40.0000 mg | Freq: Once | INTRAVENOUS | Status: AC
  Administered 2018-11-01: 40 mg via INTRAVENOUS
  Filled 2018-11-01: qty 40

## 2018-11-01 MED ORDER — SODIUM CHLORIDE 0.9 % IV BOLUS
1000.0000 mL | Freq: Once | INTRAVENOUS | Status: AC
  Administered 2018-11-01: 1000 mL via INTRAVENOUS

## 2018-11-01 NOTE — ED Triage Notes (Signed)
Pt c/o L abdominal pain x several months, pain worsened 3 days ago with emesis. Pt also states she has burning in the throat prior to emesis.

## 2018-11-01 NOTE — ED Provider Notes (Signed)
[facility] DEPT Provider Note   CSN: [id number] Arrival date & time: 11/01/18  1543     History   Chief Complaint Chief Complaint  Patient presents with  . Abdominal Pain  . Emesis    HPI Stacy [name] is a 58 y.o. female.  HPI   18yF with abdominal pain. Onset over a month ago. Initially intermittent. In the last several days it has been constant. Epigastric. Two days ago she ate dinner "and it felt like someone put a blowtorch down my throat." Associated with n/v. Symptoms improved over the night and then exacerbated after eating the next day. Today pain again worse after eating and to the point where she felt she had to come to the ED. Denies hx of PUD/gastritis but when asked about NSAID usage she replied "I have fibromyalgia. I live on ibuprofen." She takes it several times per day and has for years. Denies significant ETOH. No past abdominal surgery. No coffee ground emesis, BRBRP or melena.   Past Medical History:  Diagnosis Date  . Cancer ([place])   . Hypertension   . Mitral valve disorder     OB History   No obstetric history on file.     Home Medications    Prior to Admission medications   Not on File    Family History No family history on file.  Social History Social History   Tobacco Use  . Smoking status: Never Smoker  . Smokeless tobacco: Never Used  Substance Use Topics  . Alcohol use: Not Currently  . Drug use: Not Currently     Allergies   Erythromycin and Naproxen   Review of Systems Review of Systems  All systems reviewed and negative, other than as noted in HPI.  Physical Exam Updated Vital Signs BP (!) 193/109 (BP Location: Left Arm)   Pulse 89   Temp 98.5 F (36.9 C) (Oral)   Resp 20   SpO2 100%   Physical Exam Vitals signs and nursing note reviewed.  Constitutional:      General: She is not in acute distress.    Appearance: She is well-developed.  HENT:     Head: Normocephalic and  atraumatic.  Eyes:     General:        Right eye: No discharge.        Left eye: No discharge.     Conjunctiva/sclera: Conjunctivae normal.  Neck:     Musculoskeletal: Neck supple.  Cardiovascular:     Rate and Rhythm: Normal rate and regular rhythm.     Heart sounds: Normal heart sounds. No murmur. No friction rub. No gallop.   Pulmonary:     Effort: Pulmonary effort is normal. No respiratory distress.     Breath sounds: Normal breath sounds.  Abdominal:     General: There is no distension.     Palpations: Abdomen is soft.     Tenderness: There is abdominal tenderness in the epigastric area.  Musculoskeletal:        General: No tenderness.  Skin:    General: Skin is warm and dry.  Neurological:     Mental Status: She is alert.  Psychiatric:        Behavior: Behavior normal.        Thought Content: Thought content normal.    ED Treatments / Results  Labs (all labs ordered are listed, but only abnormal results are displayed) Labs Reviewed  CBC WITH DIFFERENTIAL/PLATELET - Abnormal; Notable for the following  components:      Result Value   WBC 14.1 (*)    Neutro Abs 9.8 (*)    Monocytes Absolute 1.3 (*)    Abs Immature Granulocytes 0.13 (*)    All other components within normal limits  COMPREHENSIVE METABOLIC PANEL - Abnormal; Notable for the following components:   Glucose, Bld 109 (*)    All other components within normal limits  URINALYSIS, ROUTINE W REFLEX MICROSCOPIC - Abnormal; Notable for the following components:   Hgb urine dipstick SMALL (*)    Bacteria, UA RARE (*)    All other components within normal limits  LIPASE, BLOOD    EKG None  Radiology No results found.  Procedures Procedures (including critical care time)  Medications Ordered in ED Medications  famotidine (PEPCID) IVPB 20 mg premix (has no administration in time range)  HYDROmorphone (DILAUDID) injection 1 mg (has no administration in time range)  ondansetron (ZOFRAN) injection 4 mg  (has no administration in time range)  sodium chloride 0.9 % bolus 1,000 mL (has no administration in time range)  alum & mag hydroxide-simeth (MAALOX/MYLANTA) 200-200-20 MG/5ML suspension 30 mL (has no administration in time range)    And  lidocaine (XYLOCAINE) 2 % viscous mouth solution 15 mL (has no administration in time range)  pantoprazole (PROTONIX) injection 40 mg (has no administration in time range)     Initial Impression / Assessment and Plan / ED Course  I have reviewed the triage vital signs and the nursing notes.  Pertinent labs & imaging results that were available during my care of the patient were reviewed by me and considered in my medical decision making (see chart for details).  57yF with burning epigastric pain. Gastritis, peptic ulcer disease, biliary colic, cholelithiasis, cholecystitis, cholangitis, hepatitis, renal colic, urinary tract infection, colitis, constipation, gastroenteritis, atypical ACS, mesenteric ischemia all considered among other etiologies in the patient's differential diagnosis.   Long history of heavy NSAID usage. Probably PUD or bad gastritis. Epigastric tenderness w/o peritoneal signs. Will tx symptoms. Labs and reassess.   Feeling better. Advised to avoid all NSAIDs. Given short course of pain meds to take instead. Needs to FU with PCP or pain management if she feels she needs chronic pain meds. PPI. PRN zofran.   Final Clinical Impressions(s) / ED Diagnoses   Final diagnoses:  Epigastric pain  Acute gastritis without hemorrhage, unspecified gastritis type    ED Discharge Orders         Ordered    HYDROcodone-acetaminophen (NORCO/VICODIN) 5-325 MG tablet  Every 6 hours PRN     11/01/18 1949    pantoprazole (PROTONIX) 20 MG tablet  2 times daily before meals     11/01/18 1949    ondansetron (ZOFRAN) 4 MG tablet  Every 6 hours PRN     11/01/18 1949           Virgel Manifold, MD 11/02/18 [phone]

## 2018-11-01 NOTE — Discharge Instructions (Addendum)
Stop taking NSAIDs (ibuprofen, aleve, naprosyn, aspirin, etc). This is tearing your stomach up. You need to follow-up with your PCP or with pain management if you feel like you need other medication for your chronic pain.

## 2018-11-02 ENCOUNTER — Encounter: Payer: Self-pay | Admitting: Internal Medicine

## 2018-11-07 ENCOUNTER — Encounter: Payer: Self-pay | Admitting: Internal Medicine

## 2018-11-07 ENCOUNTER — Ambulatory Visit (INDEPENDENT_AMBULATORY_CARE_PROVIDER_SITE_OTHER): Payer: BLUE CROSS/BLUE SHIELD | Admitting: Internal Medicine

## 2018-11-07 VITALS — BP 134/84 | HR 78 | Temp 98.0°F | Ht 64.0 in | Wt 227.0 lb

## 2018-11-07 DIAGNOSIS — C50412 Malignant neoplasm of upper-outer quadrant of left female breast: Secondary | ICD-10-CM

## 2018-11-07 DIAGNOSIS — R1013 Epigastric pain: Secondary | ICD-10-CM | POA: Diagnosis not present

## 2018-11-07 DIAGNOSIS — F419 Anxiety disorder, unspecified: Secondary | ICD-10-CM | POA: Diagnosis not present

## 2018-11-07 DIAGNOSIS — I10 Essential (primary) hypertension: Secondary | ICD-10-CM | POA: Diagnosis not present

## 2018-11-07 DIAGNOSIS — F32A Depression, unspecified: Secondary | ICD-10-CM

## 2018-11-07 DIAGNOSIS — M1990 Unspecified osteoarthritis, unspecified site: Secondary | ICD-10-CM

## 2018-11-07 DIAGNOSIS — F329 Major depressive disorder, single episode, unspecified: Secondary | ICD-10-CM

## 2018-11-07 DIAGNOSIS — K2900 Acute gastritis without bleeding: Secondary | ICD-10-CM | POA: Diagnosis not present

## 2018-11-07 DIAGNOSIS — M797 Fibromyalgia: Secondary | ICD-10-CM

## 2018-11-07 DIAGNOSIS — Z0001 Encounter for general adult medical examination with abnormal findings: Secondary | ICD-10-CM

## 2018-11-07 LAB — LIPID PANEL
Cholesterol: 174 mg/dL (ref 0–200)
HDL: 55.6 mg/dL (ref >39.00)
LDL Cholesterol: 95 mg/dL (ref 0–99)
NonHDL: 118.67
Total CHOL/HDL Ratio: 3
Triglycerides: 117 mg/dL (ref 0.0–149.0)
VLDL: 23.4 mg/dL (ref 0.0–40.0)

## 2018-11-07 LAB — H. PYLORI ANTIBODY, IGG: H Pylori IgG: POSITIVE — AB

## 2018-11-07 LAB — HEMOGLOBIN A1C: Hgb A1c MFr Bld: 5.7 % (ref 4.6–6.5)

## 2018-11-07 LAB — VITAMIN D 25 HYDROXY (VIT D DEFICIENCY, FRACTURES): VITD: 24.29 ng/mL — ABNORMAL LOW (ref 30.00–100.00)

## 2018-11-07 MED ORDER — AMLODIPINE BESYLATE 5 MG PO TABS: 5.0000 mg | ORAL_TABLET | Freq: Every day | ORAL | 2 refills | Status: DC

## 2018-11-07 NOTE — Assessment & Plan Note (Signed)
Chronic but stable off meds Will monitor 

## 2018-11-07 NOTE — Assessment & Plan Note (Signed)
Advised her to stop NSAID use Discussed regular physical activity and exercise for weight loss

## 2018-11-07 NOTE — Assessment & Plan Note (Signed)
In remission Follow with oncology yearly Yearly mammograms

## 2018-11-07 NOTE — Patient Instructions (Signed)
Health Maintenance for Postmenopausal Women Menopause is a normal process in which your reproductive ability comes to an end. This process happens gradually over a span of months to years, usually between the ages of 62 and 89. Menopause is complete when you have missed 12 consecutive menstrual periods. It is important to talk with your health care provider about some of the most common conditions that affect postmenopausal women, such as heart disease, cancer, and bone loss (osteoporosis). Adopting a healthy lifestyle and getting preventive care can help to promote your health and wellness. Those actions can also lower your chances of developing some of these common conditions. What should I know about menopause? During menopause, you may experience a number of symptoms, such as:  Moderate-to-severe hot flashes.  Night sweats.  Decrease in sex drive.  Mood swings.  Headaches.  Tiredness.  Irritability.  Memory problems.  Insomnia. Choosing to treat or not to treat menopausal changes is an individual decision that you make with your health care provider. What should I know about hormone replacement therapy and supplements? Hormone therapy products are effective for treating symptoms that are associated with menopause, such as hot flashes and night sweats. Hormone replacement carries certain risks, especially as you become older. If you are thinking about using estrogen or estrogen with progestin treatments, discuss the benefits and risks with your health care provider. What should I know about heart disease and stroke? Heart disease, heart attack, and stroke become more likely as you age. This may be due, in part, to the hormonal changes that your body experiences during menopause. These can affect how your body processes dietary fats, triglycerides, and cholesterol. Heart attack and stroke are both medical emergencies. There are many things that you can do to help prevent heart disease  and stroke:  Have your blood pressure checked at least every 1-2 years. High blood pressure causes heart disease and increases the risk of stroke.  If you are 79-72 years old, ask your health care provider if you should take aspirin to prevent a heart attack or a stroke.  Do not use any tobacco products, including cigarettes, chewing tobacco, or electronic cigarettes. If you need help quitting, ask your health care provider.  It is important to eat a healthy diet and maintain a healthy weight. ? Be sure to include plenty of vegetables, fruits, low-fat dairy products, and lean protein. ? Avoid eating foods that are high in solid fats, added sugars, or salt (sodium).  Get regular exercise. This is one of the most important things that you can do for your health. ? Try to exercise for at least 150 minutes each week. The type of exercise that you do should increase your heart rate and make you sweat. This is known as moderate-intensity exercise. ? Try to do strengthening exercises at least twice each week. Do these in addition to the moderate-intensity exercise.  Know your numbers.Ask your health care provider to check your cholesterol and your blood glucose. Continue to have your blood tested as directed by your health care provider.  What should I know about cancer screening? There are several types of cancer. Take the following steps to reduce your risk and to catch any cancer development as early as possible. Breast Cancer  Practice breast self-awareness. ? This means understanding how your breasts normally appear and feel. ? It also means doing regular breast self-exams. Let your health care provider know about any changes, no matter how small.  If you are 40 or  older, have a clinician do a breast exam (clinical breast exam or CBE) every year. Depending on your age, family history, and medical history, it may be recommended that you also have a yearly breast X-ray (mammogram).  If you  have a family history of breast cancer, talk with your health care provider about genetic screening.  If you are at high risk for breast cancer, talk with your health care provider about having an MRI and a mammogram every year.  Breast cancer (BRCA) gene test is recommended for women who have family members with BRCA-related cancers. Results of the assessment will determine the need for genetic counseling and BRCA1 and for BRCA2 testing. BRCA-related cancers include these types: ? Breast. This occurs in males or females. ? Ovarian. ? Tubal. This may also be called fallopian tube cancer. ? Cancer of the abdominal or pelvic lining (peritoneal cancer). ? Prostate. ? Pancreatic. Cervical, Uterine, and Ovarian Cancer Your health care provider may recommend that you be screened regularly for cancer of the pelvic organs. These include your ovaries, uterus, and vagina. This screening involves a pelvic exam, which includes checking for microscopic changes to the surface of your cervix (Pap test).  For women ages 21-65, health care providers may recommend a pelvic exam and a Pap test every three years. For women ages 39-65, they may recommend the Pap test and pelvic exam, combined with testing for human papilloma virus (HPV), every five years. Some types of HPV increase your risk of cervical cancer. Testing for HPV may also be done on women of any age who have unclear Pap test results.  Other health care providers may not recommend any screening for nonpregnant women who are considered low risk for pelvic cancer and have no symptoms. Ask your health care provider if a screening pelvic exam is right for you.  If you have had past treatment for cervical cancer or a condition that could lead to cancer, you need Pap tests and screening for cancer for at least 20 years after your treatment. If Pap tests have been discontinued for you, your risk factors (such as having a new sexual partner) need to be reassessed  to determine if you should start having screenings again. Some women have medical problems that increase the chance of getting cervical cancer. In these cases, your health care provider may recommend that you have screening and Pap tests more often.  If you have a family history of uterine cancer or ovarian cancer, talk with your health care provider about genetic screening.  If you have vaginal bleeding after reaching menopause, tell your health care provider.  There are currently no reliable tests available to screen for ovarian cancer. Lung Cancer Lung cancer screening is recommended for adults 57-50 years old who are at high risk for lung cancer because of a history of smoking. A yearly low-dose CT scan of the lungs is recommended if you:  Currently smoke.  Have a history of at least 30 pack-years of smoking and you currently smoke or have quit within the past 15 years. A pack-year is smoking an average of one pack of cigarettes per day for one year. Yearly screening should:  Continue until it has been 15 years since you quit.  Stop if you develop a health problem that would prevent you from having lung cancer treatment. Colorectal Cancer  This type of cancer can be detected and can often be prevented.  Routine colorectal cancer screening usually begins at age 12 and continues through  age 75.  If you have risk factors for colon cancer, your health care provider may recommend that you be screened at an earlier age.  If you have a family history of colorectal cancer, talk with your health care provider about genetic screening.  Your health care provider may also recommend using home test kits to check for hidden blood in your stool.  A small camera at the end of a tube can be used to examine your colon directly (sigmoidoscopy or colonoscopy). This is done to check for the earliest forms of colorectal cancer.  Direct examination of the colon should be repeated every 5-10 years until  age 75. However, if early forms of precancerous polyps or small growths are found or if you have a family history or genetic risk for colorectal cancer, you may need to be screened more often. Skin Cancer  Check your skin from head to toe regularly.  Monitor any moles. Be sure to tell your health care provider: ? About any new moles or changes in moles, especially if there is a change in a mole's shape or color. ? If you have a mole that is larger than the size of a pencil eraser.  If any of your family members has a history of skin cancer, especially at a young age, talk with your health care provider about genetic screening.  Always use sunscreen. Apply sunscreen liberally and repeatedly throughout the day.  Whenever you are outside, protect yourself by wearing long sleeves, pants, a wide-brimmed hat, and sunglasses. What should I know about osteoporosis? Osteoporosis is a condition in which bone destruction happens more quickly than new bone creation. After menopause, you may be at an increased risk for osteoporosis. To help prevent osteoporosis or the bone fractures that can happen because of osteoporosis, the following is recommended:  If you are 19-50 years old, get at least 1,000 mg of calcium and at least 600 mg of vitamin D per day.  If you are older than age 50 but younger than age 70, get at least 1,200 mg of calcium and at least 600 mg of vitamin D per day.  If you are older than age 70, get at least 1,200 mg of calcium and at least 800 mg of vitamin D per day. Smoking and excessive alcohol intake increase the risk of osteoporosis. Eat foods that are rich in calcium and vitamin D, and do weight-bearing exercises several times each week as directed by your health care provider. What should I know about how menopause affects my mental health? Depression may occur at any age, but it is more common as you become older. Common symptoms of depression include:  Low or sad  mood.  Changes in sleep patterns.  Changes in appetite or eating patterns.  Feeling an overall lack of motivation or enjoyment of activities that you previously enjoyed.  Frequent crying spells. Talk with your health care provider if you think that you are experiencing depression. What should I know about immunizations? It is important that you get and maintain your immunizations. These include:  Tetanus, diphtheria, and pertussis (Tdap) booster vaccine.  Influenza every year before the flu season begins.  Pneumonia vaccine.  Shingles vaccine. Your health care provider may also recommend other immunizations. This information is not intended to replace advice given to you by your health care provider. Make sure you discuss any questions you have with your health care provider. Document Released: 10/26/2005 Document Revised: 03/23/2016 Document Reviewed: 06/07/2015 Elsevier Interactive Patient Education    2019 [place].

## 2018-11-07 NOTE — Assessment & Plan Note (Signed)
Add Amlodipine to HCTZ Reinforced DASH diet and exercise for weight loss CMET reviewed  RTC in 2 weeks for follow up HTN

## 2018-11-07 NOTE — Progress Notes (Signed)
Subjective:    Patient ID: Stacy [name], female    DOB: [birth year], 58 y.o.   MRN: [id number]  HPI  Pt presents to the clinic today for her annual exam. She is also due to follow up chronic conditions.  Anxiety and Depression: Stable off meds. She is not currently seeing a therapist or psychiatry.  Arthritis/Fibromyalgia: Managed on Ibuprofen. She is using the treadmill 45 minutes, 1 day per week.  HTN: Her BP today is 134/84. She is taking HCTZ as prescribed. ECG from 01/2017 reviewed.  History of Left Breast Cancer: s/p excision, chemo and radiation. She follows with oncology yearly.  GERD: Triggered by fried, fatty, greasy foods. She was recently started on Pantoprazole after an ER visit for presumed NSAID induced gastritis. She continues to have epigastric pain. There is no upper GI on file.   Flu: 05/2015 Tetanus: 10/2010 Pap Smear: 04/2016 Mammogram: 07/2018 Colon Screening: 09/2017, Cologuard Vision Screening: annually Dentist: as needed  Diet: She does eat meat. She consumes fruits and veggies daily. She does eat some fried foods. She drinks mostly water. Exercise: walking on treadmill for 45 minutes   Review of Systems      Past Medical History:  Diagnosis Date  . Anxiety   . Arthritis   . Cancer ([place])   . Complication of anesthesia    DIFFICULTY WAKING UP , LAST SURGERY NECK 2012 WAS FINE PER PT HERE AT [facility]  . Depression   . Fibromyalgia   . Genetic testing 12/21/2016   Genetic testing reported out on 12/20/2016 through [facility]'s 46-gene Common Hereditary Cancers panel found no deleterious mutations. The following 46 genes were analyzed: APC, ATM, AXIN2, BARD1, BMPR1A, BRCA1, BRCA2, BRIP1, CDH1, CDKN2A, CHEK2, CTNNA1, DICER1, EPCAM, GREM1, KIT, MEN1, MLH1, MSH2, MSH3, MSH6, MUTYH, NBN, NF1, NTHL1, PALB2, PDGFRA, PMS2, POLD1, POLE, PTEN, RAD50, RAD51C, RAD51D, SDH  . Headache    HX MIGRAINES    . Heart murmur   . History of radiation therapy  02/14/17-04/03/2017   left breast 60.4 Gy: 50.4 Gy delivered in 28 fractions and a boost of 10 Gy delivered in 5 fractions.  . Hypertension   . Malignant neoplasm of upper-outer quadrant of left female breast ([place]) 08/23/2016  . Mitral valve disorder   . MVP (mitral valve prolapse)    AS INFANT  . Neck pain   . Personal history of chemotherapy   . Personal history of radiation therapy   . Vision abnormalities     Current Outpatient Medications  Medication Sig Dispense Refill  . alum & mag hydroxide-simeth (MAALOX/MYLANTA) 200-200-20 MG/5ML suspension Take 15 mLs by mouth every 6 (six) hours as needed for indigestion or heartburn.    Marland Kitchen amoxicillin (AMOXIL) 875 MG tablet Take 1 tablet (875 mg total) by mouth 2 (two) times daily. 14 tablet 0  . chlorpheniramine-HYDROcodone (TUSSIONEX PENNKINETIC ER) 10-8 MG/5ML SUER Take 5 mLs by mouth at bedtime as needed. 140 mL 0  . fluticasone (FLONASE) 50 MCG/ACT nasal spray Place 2 sprays into both nostrils daily as needed for allergies or rhinitis.    . hydrochlorothiazide (HYDRODIURIL) 12.5 MG tablet Take 12.5 mg by mouth daily.    . hydrochlorothiazide (HYDRODIURIL) 25 MG tablet Take 1 tablet (25 mg total) by mouth daily. 90 tablet 0  . HYDROcodone-acetaminophen (NORCO/VICODIN) 5-325 MG tablet Take 1 tablet by mouth every 6 (six) hours as needed. 10 tablet 0  . ibuprofen (ADVIL,MOTRIN) 200 MG tablet Take 400 mg by mouth every 4 (four) hours as  needed for headache, mild pain or moderate pain.     Marland Kitchen ibuprofen (ADVIL,MOTRIN) 200 MG tablet Take 400 mg by mouth every 6 (six) hours as needed for mild pain.    Marland Kitchen ondansetron (ZOFRAN) 4 MG tablet Take 1 tablet (4 mg total) by mouth every 6 (six) hours as needed for nausea or vomiting. 12 tablet 0  . pantoprazole (PROTONIX) 20 MG tablet Take 1 tablet (20 mg total) by mouth 2 (two) times daily before a meal. 60 tablet 0   No current facility-administered medications for this visit.     Allergies  Allergen  Reactions  . Gabapentin Swelling    "makes me feel detached"  . Naproxen Palpitations  . Neulasta [Pegfilgrastim] Itching and Other (See Comments)    Patient stated,"reddish skin tone, body on fire, tingly tongue."  . Erythromycin Nausea And Vomiting  . Gabapentin Other (See Comments)    "Makes me feel out of my body"  . Neulasta [Pegfilgrastim] Itching  . Tramadol Other (See Comments)    headache  . Erythromycin Nausea And Vomiting  . Naproxen Palpitations  . Tramadol Hcl Other (See Comments)    Headaches    Family History  Adopted: Yes  Problem Relation Age of Onset  . Breast cancer Brother 34       Maternal half-brother. Currently 31.  Marland Kitchen BRCA 1/2 Neg Hx     Social History   Socioeconomic History  . Marital status: Married    Spouse name: Not on file  . Number of children: 4  . Years of education: Not on file  . Highest education level: Not on file  Occupational History  . Not on file  Social Needs  . Financial resource strain: Not on file  . Food insecurity:    Worry: Not on file    Inability: Not on file  . Transportation needs:    Medical: Not on file    Non-medical: Not on file  Tobacco Use  . Smoking status: Never Smoker  . Smokeless tobacco: Never Used  Substance and Sexual Activity  . Alcohol use: Not Currently  . Drug use: Not Currently  . Sexual activity: Yes    Birth control/protection: Post-menopausal  Lifestyle  . Physical activity:    Days per week: Not on file    Minutes per session: Not on file  . Stress: Not on file  Relationships  . Social connections:    Talks on phone: Not on file    Gets together: Not on file    Attends religious service: Not on file    Active member of club or organization: Not on file    Attends meetings of clubs or organizations: Not on file    Relationship status: Not on file  . Intimate partner violence:    Fear of current or ex partner: Not on file    Emotionally abused: Not on file    Physically abused:  Not on file    Forced sexual activity: Not on file  Other Topics Concern  . Not on file  Social History Narrative   ** Merged History Encounter **         Constitutional: Denies fever, malaise, fatigue, headache or abrupt weight changes.  HEENT: Denies eye pain, eye redness, ear pain, ringing in the ears, wax buildup, runny nose, nasal congestion, bloody nose, or sore throat. Respiratory: Denies difficulty breathing, shortness of breath, cough or sputum production.   Cardiovascular: Denies chest pain, chest tightness, palpitations or swelling in  the hands or feet.  Gastrointestinal: Pt reports epigastric pain. Denies abdominal pain, bloating, constipation, diarrhea or blood in the stool.  GU: Denies urgency, frequency, pain with urination, burning sensation, blood in urine, odor or discharge. Musculoskeletal: Pt reports chronic joint and muscle pain. Denies decrease in range of motion, difficulty with gait, or joint swelling.  Skin: Denies redness, rashes, lesions or ulcercations.  Neurological: Denies dizziness, difficulty with memory, difficulty with speech or problems with balance and coordination.  Psych: Pt has a history of anxiety and depression. Denies SI/HI.  No other specific complaints in a complete review of systems (except as listed in HPI above).  Objective:   Physical Exam   BP 134/84   Pulse 78   Temp 98 F (36.7 C) (Oral)   Ht 5' 4"  (1.626 m)   Wt 227 lb (103 kg)   LMP 11/07/2016   SpO2 98%   BMI 38.96 kg/m  Wt Readings from Last 3 Encounters:  11/07/18 227 lb (103 kg)  07/16/18 231 lb (104.8 kg)  03/07/18 228 lb (103.4 kg)    General: Appears her stated age, obese, in NAD. Skin: Warm, dry and intact.  HEENT: Head: normal shape and size; Eyes: sclera white, no icterus, conjunctiva pink, PERRLA and EOMs intact; Ears: Tm's gray and intact, normal light reflex; hroat/Mouth: Teeth present, mucosa pink and moist, no exudate, lesions or ulcerations noted.    Neck:  Neck supple, trachea midline. No masses, lumps or thyromegaly present.  Cardiovascular: Normal rate and rhythm. S1,S2 noted.  No murmur, rubs or gallops noted. No JVD or BLE edema. No carotid bruits noted. Pulmonary/Chest: Normal effort and positive vesicular breath sounds. No respiratory distress. No wheezes, rales or ronchi noted.  Abdomen: Soft and tender in the epigastric region. Normal bowel sounds. No distention or masses noted. Liver, spleen and kidneys non palpable. Musculoskeletal: Strength 5/5 BUE/BLE. No difficulty with gait.  Neurological: Alert and oriented. Cranial nerves II-XII grossly intact. Coordination normal.  Psychiatric: Mood and affect normal. Behavior is normal. Judgment and thought content normal.    BMET    Component Value Date/Time   NA 135 11/01/2018 1657   NA 138 09/11/2017 0902   K 3.8 11/01/2018 1657   K 3.5 09/11/2017 0902   CL 102 11/01/2018 1657   CO2 24 11/01/2018 1657   CO2 25 09/11/2017 0902   GLUCOSE 109 (H) 11/01/2018 1657   GLUCOSE 98 09/11/2017 0902   BUN 19 11/01/2018 1657   BUN 18.2 09/11/2017 0902   CREATININE 0.92 11/01/2018 1657   CREATININE 1.0 09/11/2017 0902   CALCIUM 9.1 11/01/2018 1657   CALCIUM 9.4 09/11/2017 0902   GFRNONAA >60 11/01/2018 1657   GFRAA >60 11/01/2018 1657    Lipid Panel     Component Value Date/Time   CHOL 172 04/20/2016 1417   TRIG 127.0 04/20/2016 1417   HDL 59.10 04/20/2016 1417   CHOLHDL 3 04/20/2016 1417   VLDL 25.4 04/20/2016 1417   LDLCALC 88 04/20/2016 1417    CBC    Component Value Date/Time   WBC 14.1 (H) 11/01/2018 1657   RBC 4.75 11/01/2018 1657   HGB 14.5 11/01/2018 1657   HGB 13.0 09/11/2017 0902   HCT 44.0 11/01/2018 1657   HCT 38.6 09/11/2017 0902   PLT 297 11/01/2018 1657   PLT 263 09/11/2017 0902   MCV 92.6 11/01/2018 1657   MCV 88.7 09/11/2017 0902   MCH 30.5 11/01/2018 1657   MCHC 33.0 11/01/2018 1657   RDW 13.1  11/01/2018 1657   RDW 13.4 09/11/2017 0902    LYMPHSABS 2.8 11/01/2018 1657   LYMPHSABS 1.4 09/11/2017 0902   MONOABS 1.3 (H) 11/01/2018 1657   MONOABS 0.8 09/11/2017 0902   EOSABS 0.1 11/01/2018 1657   EOSABS 0.2 09/11/2017 0902   BASOSABS 0.1 11/01/2018 1657   BASOSABS 0.0 09/11/2017 0902    Hgb A1C Lab Results  Component Value Date   HGBA1C 5.6 04/20/2016           Assessment & Plan:   Preventative Health Maintenance:  She declines flu shot today Tetanus UTD Pap smear UTD Mammogram UTD Colon screening UTD Encouraged her to consume a balanced diet and exercise regimen Advised her to see an eye doctor and dentist annually Will check Lipid, A1C and Vit D today. Recent CBC and CMET from ER reviewed  Epigastric Pain, NSAID Induced Gastritis:  ER notes and labs reviewed Continue Pantoprazole Advised her to cut back on NSAID use, currently taking 6-7 per day She reports Advil is the only thing that helps her muscle and joint pain Referral to GI for upper GI  Will follow up after labs, return precautions discussed Webb Silversmith, NP

## 2018-11-08 ENCOUNTER — Other Ambulatory Visit: Payer: Self-pay | Admitting: Internal Medicine

## 2018-11-08 MED ORDER — CLARITHROMYCIN 500 MG PO TABS: 500.0000 mg | ORAL_TABLET | Freq: Two times a day (BID) | ORAL | 0 refills | Status: DC

## 2018-11-08 MED ORDER — METRONIDAZOLE 500 MG PO TABS: 500.0000 mg | ORAL_TABLET | Freq: Three times a day (TID) | ORAL | 0 refills | Status: DC

## 2018-11-08 MED ORDER — PANTOPRAZOLE SODIUM 40 MG PO TBEC: 40.0000 mg | DELAYED_RELEASE_TABLET | Freq: Every day | ORAL | 0 refills | Status: DC

## 2018-11-12 IMAGING — CR DG CHEST 2V
2 series · 2 of 2 positions shown · non-contrast
Comparison: Chest radiograph dated 09/06/2016

CLINICAL DATA: 55-year-old female with cough.

EXAM:
CHEST  2 VIEW

[w chest pa]
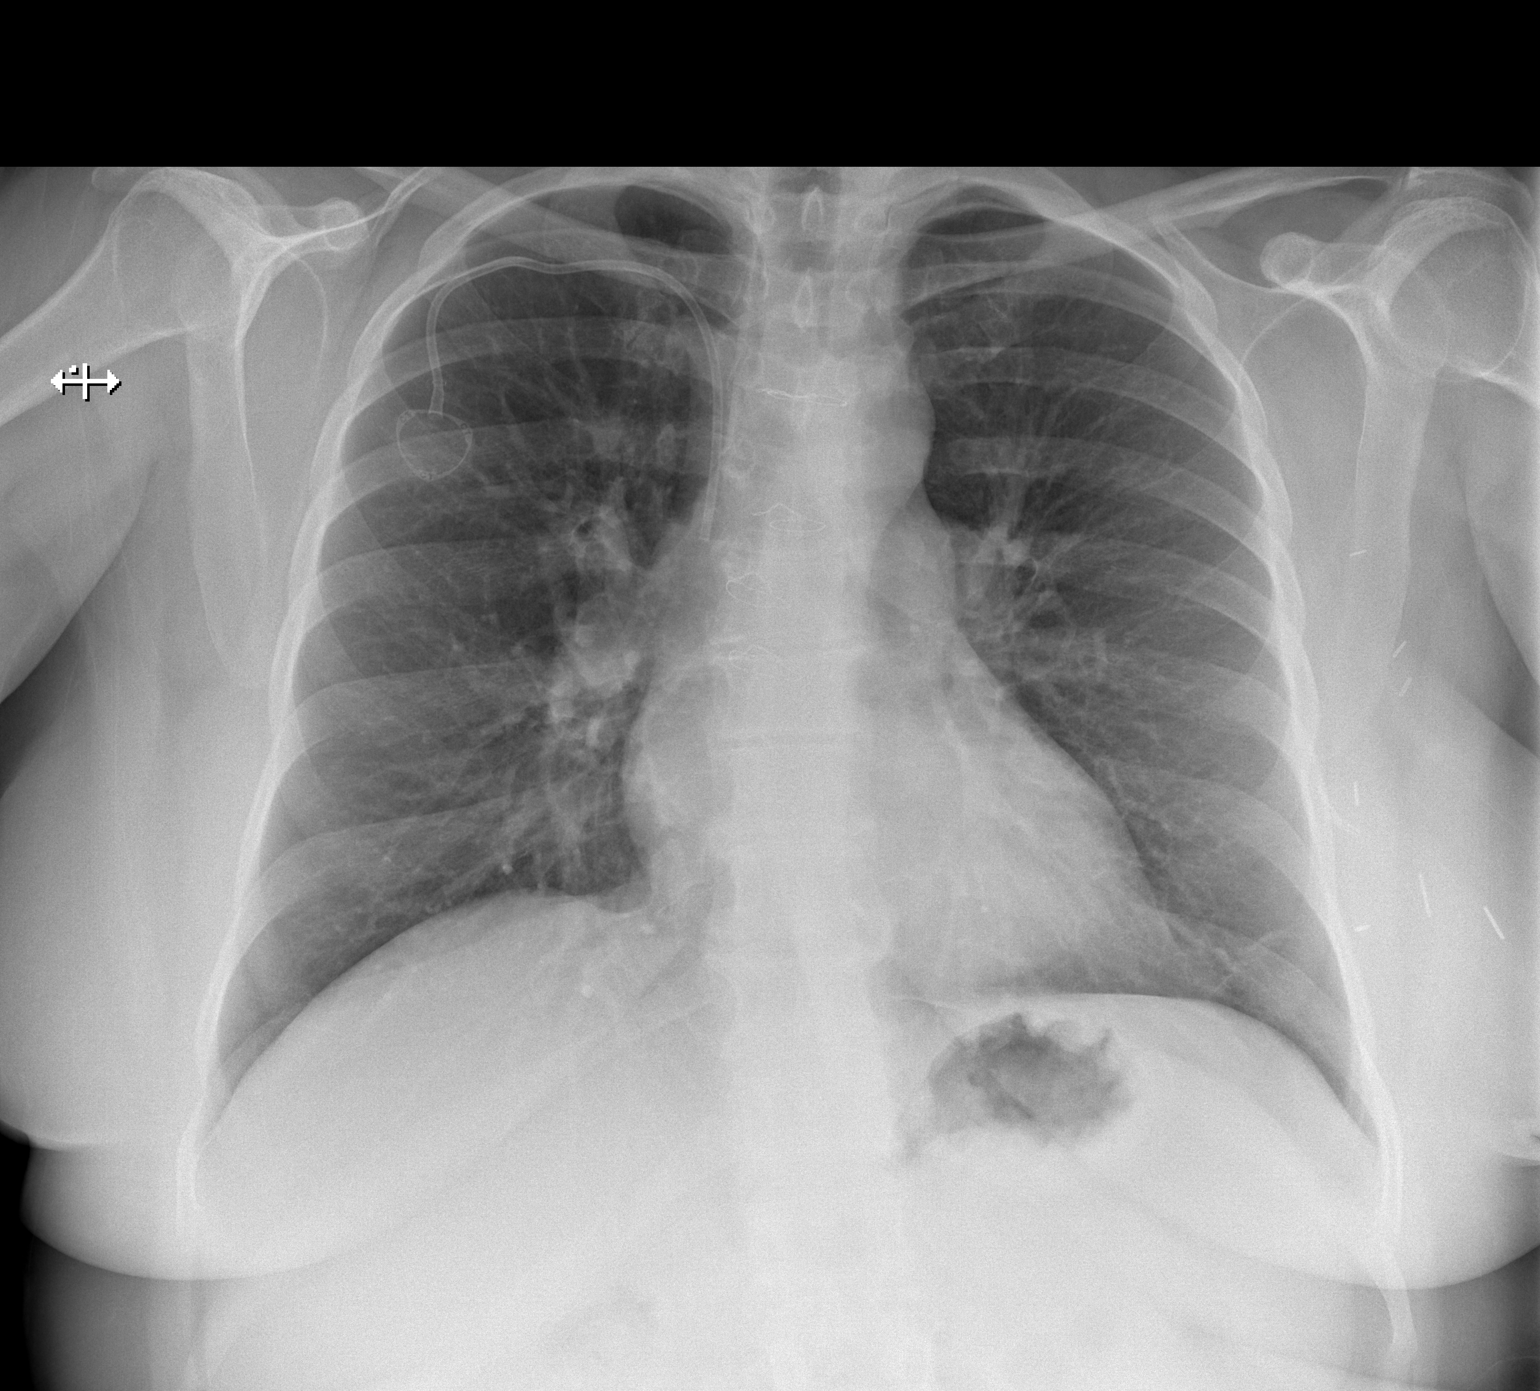

[w chest lat]
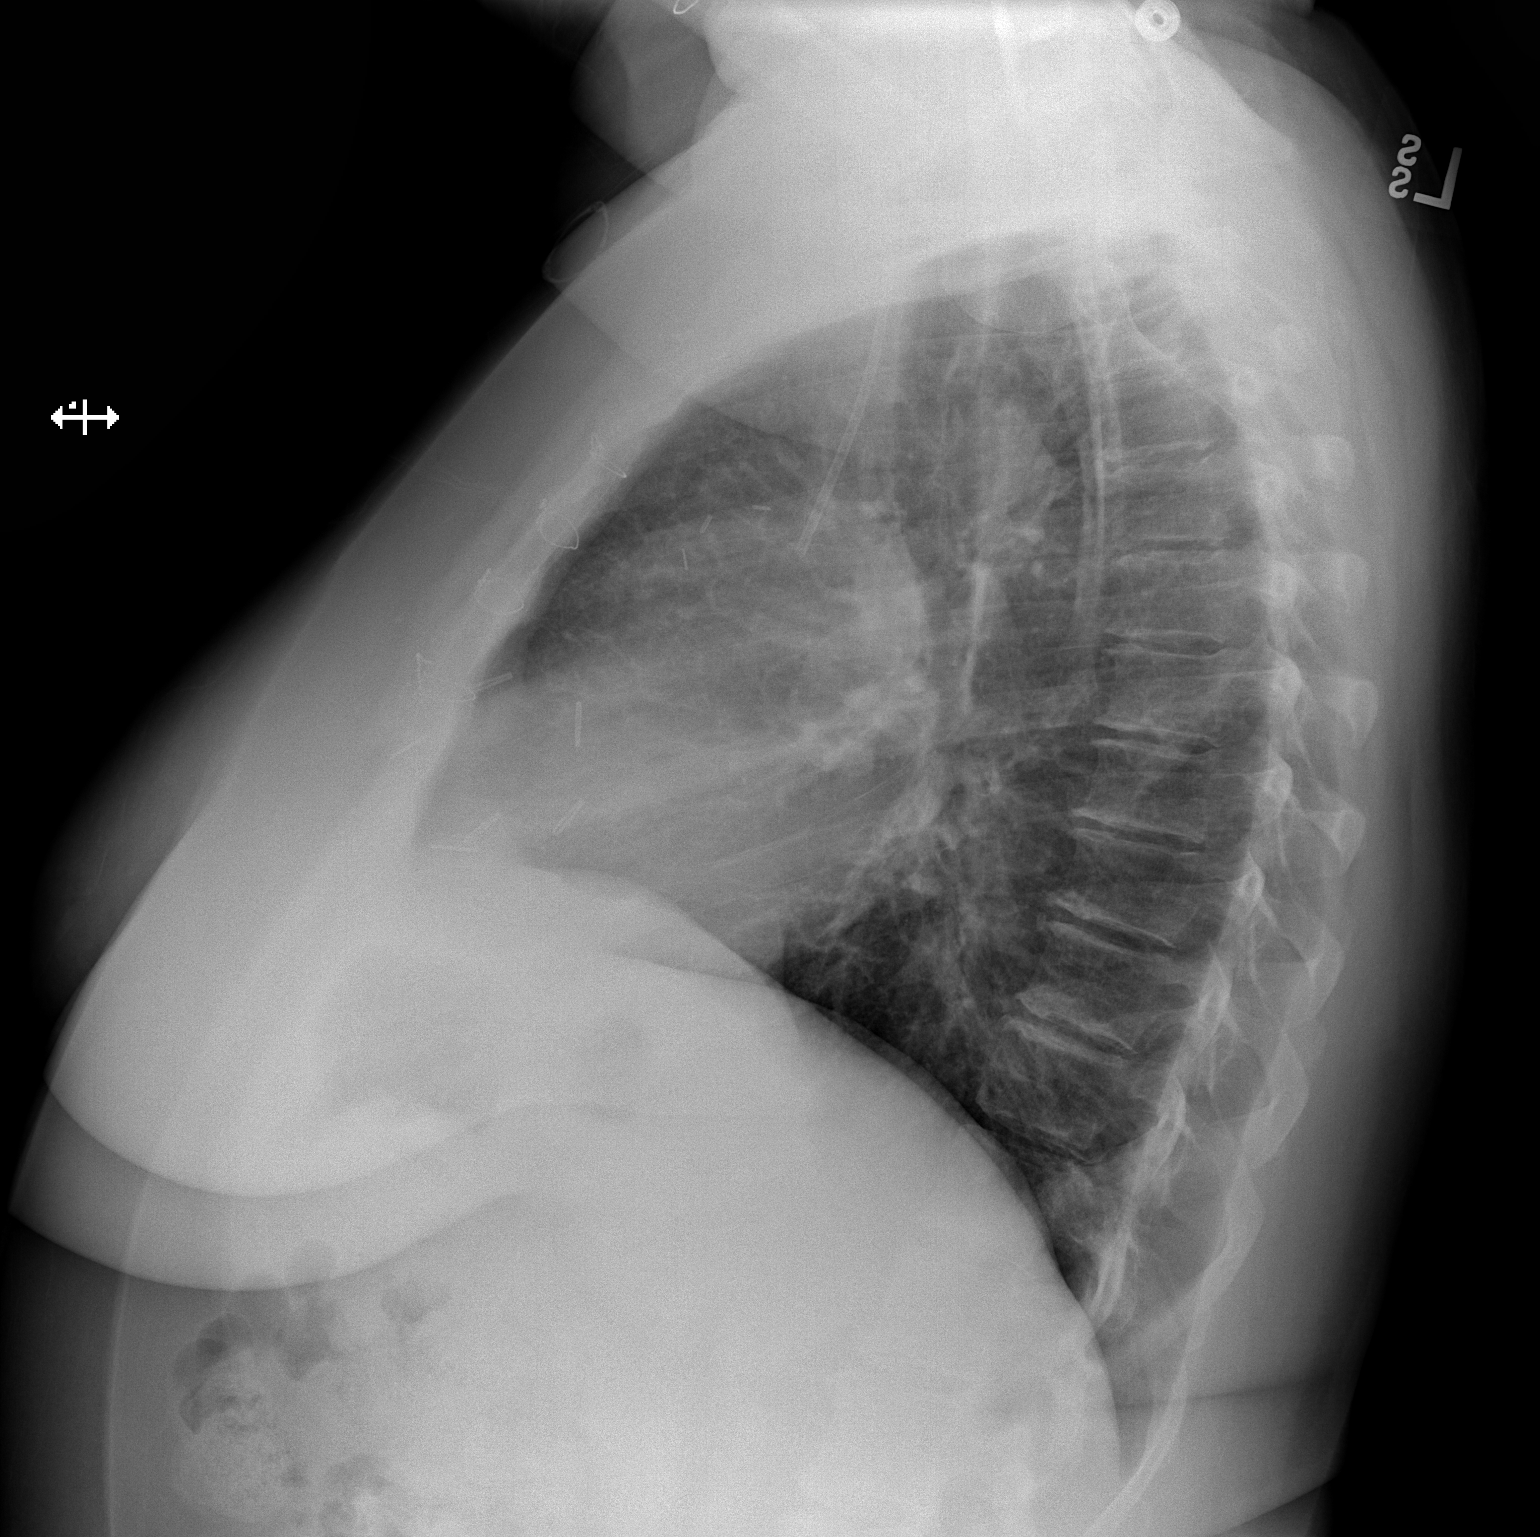

[2 of 2 positions shown; findings below may reference images not displayed]

FINDINGS: Right pectoral Port-A-Cath with tip over central SVC. The lungs are
clear. There is no pleural effusion or pneumothorax. The cardiac
silhouette is within normal limits. Median sternotomy wires noted.
Multiple surgical clips noted in left chest wall. Lower cervical
fixation hardware. No acute osseous pathology.
IMPRESSION: No active cardiopulmonary disease.

## 2018-11-21 ENCOUNTER — Ambulatory Visit: Payer: BLUE CROSS/BLUE SHIELD | Admitting: Internal Medicine

## 2018-11-21 NOTE — Progress Notes (Deleted)
Subjective:    Patient ID: Stacy [name], female    DOB: Jun 18, 1961, 58 y.o.   MRN: [id number]  HPI  Pt presents to the clinic today for 2 week follow up of HTN. At her last visit, Amlodipine was added to her HCTZ. She has been taking the medication as prescribed. She denies adverse side effects. Her BP today is. ECG from 01/2017 reviewed.  Review of Systems      Past Medical History:  Diagnosis Date  . Anxiety   . Arthritis   . Cancer ([place])   . Complication of anesthesia    DIFFICULTY WAKING UP , LAST SURGERY NECK 2012 WAS FINE PER PT HERE AT [facility]  . Depression   . Fibromyalgia   . Genetic testing 12/21/2016   Genetic testing reported out on 12/20/2016 through [facility]'s 46-gene Common Hereditary Cancers panel found no deleterious mutations. The following 46 genes were analyzed: APC, ATM, AXIN2, BARD1, BMPR1A, BRCA1, BRCA2, BRIP1, CDH1, CDKN2A, CHEK2, CTNNA1, DICER1, EPCAM, GREM1, KIT, MEN1, MLH1, MSH2, MSH3, MSH6, MUTYH, NBN, NF1, NTHL1, PALB2, PDGFRA, PMS2, POLD1, POLE, PTEN, RAD50, RAD51C, RAD51D, SDH  . Headache    HX MIGRAINES    . Heart murmur   . History of radiation therapy 02/14/17-04/03/2017   left breast 60.4 Gy: 50.4 Gy delivered in 28 fractions and a boost of 10 Gy delivered in 5 fractions.  . Hypertension   . Malignant neoplasm of upper-outer quadrant of left female breast ([place]) 08/23/2016  . Mitral valve disorder   . MVP (mitral valve prolapse)    AS INFANT  . Neck pain   . Personal history of chemotherapy   . Personal history of radiation therapy   . Vision abnormalities     Current Outpatient Medications  Medication Sig Dispense Refill  . alum & mag hydroxide-simeth (MAALOX/MYLANTA) 200-200-20 MG/5ML suspension Take 15 mLs by mouth every 6 (six) hours as needed for indigestion or heartburn.    Marland Kitchen amLODipine (NORVASC) 5 MG tablet Take 1 tablet (5 mg total) by mouth daily. 30 tablet 2  . clarithromycin (BIAXIN) 500 MG tablet Take 1 tablet (500 mg total) by  mouth 2 (two) times daily. 28 tablet 0  . hydrochlorothiazide (HYDRODIURIL) 25 MG tablet Take 1 tablet (25 mg total) by mouth daily. 90 tablet 0  . ibuprofen (ADVIL,MOTRIN) 200 MG tablet Take 400 mg by mouth every 6 (six) hours as needed for mild pain.    . metroNIDAZOLE (FLAGYL) 500 MG tablet Take 1 tablet (500 mg total) by mouth 3 (three) times daily. 42 tablet 0  . ondansetron (ZOFRAN) 4 MG tablet Take 1 tablet (4 mg total) by mouth every 6 (six) hours as needed for nausea or vomiting. 12 tablet 0  . pantoprazole (PROTONIX) 40 MG tablet Take 1 tablet (40 mg total) by mouth daily. 28 tablet 0   No current facility-administered medications for this visit.     Allergies  Allergen Reactions  . Gabapentin Swelling    "makes me feel detached"  . Naproxen Palpitations  . Neulasta [Pegfilgrastim] Itching and Other (See Comments)    Patient stated,"reddish skin tone, body on fire, tingly tongue."  . Erythromycin Nausea And Vomiting  . Gabapentin Other (See Comments)    "Makes me feel out of my body"  . Neulasta [Pegfilgrastim] Itching  . Tramadol Other (See Comments)    headache  . Erythromycin Nausea And Vomiting  . Naproxen Palpitations  . Tramadol Hcl Other (See Comments)    Headaches  Family History  Adopted: Yes  Problem Relation Age of Onset  . Breast cancer Brother 41       Maternal half-brother. Currently 45.  Marland Kitchen BRCA 1/2 Neg Hx     Social History   Socioeconomic History  . Marital status: Married    Spouse name: Not on file  . Number of children: 4  . Years of education: Not on file  . Highest education level: Not on file  Occupational History  . Not on file  Social Needs  . Financial resource strain: Not on file  . Food insecurity:    Worry: Not on file    Inability: Not on file  . Transportation needs:    Medical: Not on file    Non-medical: Not on file  Tobacco Use  . Smoking status: Never Smoker  . Smokeless tobacco: Never Used  Substance and Sexual  Activity  . Alcohol use: Not Currently  . Drug use: Not Currently  . Sexual activity: Yes    Birth control/protection: Post-menopausal  Lifestyle  . Physical activity:    Days per week: Not on file    Minutes per session: Not on file  . Stress: Not on file  Relationships  . Social connections:    Talks on phone: Not on file    Gets together: Not on file    Attends religious service: Not on file    Active member of club or organization: Not on file    Attends meetings of clubs or organizations: Not on file    Relationship status: Not on file  . Intimate partner violence:    Fear of current or ex partner: Not on file    Emotionally abused: Not on file    Physically abused: Not on file    Forced sexual activity: Not on file  Other Topics Concern  . Not on file  Social History Narrative   ** Merged History Encounter **         Constitutional: Denies fever, malaise, fatigue, headache or abrupt weight changes.  HEENT: Denies eye pain, eye redness, ear pain, ringing in the ears, wax buildup, runny nose, nasal congestion, bloody nose, or sore throat. Respiratory: Denies difficulty breathing, shortness of breath, cough or sputum production.   Cardiovascular: Denies chest pain, chest tightness, palpitations or swelling in the hands or feet.  Gastrointestinal: Denies abdominal pain, bloating, constipation, diarrhea or blood in the stool.  GU: Denies urgency, frequency, pain with urination, burning sensation, blood in urine, odor or discharge. Musculoskeletal: Denies decrease in range of motion, difficulty with gait, muscle pain or joint pain and swelling.  Skin: Denies redness, rashes, lesions or ulcercations.  Neurological: Denies dizziness, difficulty with memory, difficulty with speech or problems with balance and coordination.  Psych: Denies anxiety, depression, SI/HI.  No other specific complaints in a complete review of systems (except as listed in HPI above).  Objective:    Physical Exam        Assessment & Plan:

## 2018-11-26 ENCOUNTER — Other Ambulatory Visit: Payer: Self-pay

## 2018-11-26 ENCOUNTER — Ambulatory Visit: Payer: BLUE CROSS/BLUE SHIELD | Admitting: Internal Medicine

## 2018-11-26 ENCOUNTER — Encounter: Payer: Self-pay | Admitting: Internal Medicine

## 2018-11-26 DIAGNOSIS — I10 Essential (primary) hypertension: Secondary | ICD-10-CM

## 2018-11-26 NOTE — Patient Instructions (Signed)
Managing Your Hypertension  Hypertension is commonly called high blood pressure. This is when the force of your blood pressing against the walls of your arteries is too strong. Arteries are blood vessels that carry blood from your heart throughout your body. Hypertension forces the heart to work harder to pump blood, and may cause the arteries to become narrow or stiff. Having untreated or uncontrolled hypertension can cause heart attack, stroke, kidney disease, and other problems.  What are blood pressure readings?  A blood pressure reading consists of a higher number over a lower number. Ideally, your blood pressure should be below 120/80. The first ("top") number is called the systolic pressure. It is a measure of the pressure in your arteries as your heart beats. The second ("bottom") number is called the diastolic pressure. It is a measure of the pressure in your arteries as the heart relaxes.  What does my blood pressure reading mean?  Blood pressure is classified into four stages. Based on your blood pressure reading, your health care provider may use the following stages to determine what type of treatment you need, if any. Systolic pressure and diastolic pressure are measured in a unit called mm Hg.  Normal   Systolic pressure: below 120.   Diastolic pressure: below 80.  Elevated   Systolic pressure: 120-129.   Diastolic pressure: below 80.  Hypertension stage 1   Systolic pressure: 130-139.   Diastolic pressure: 80-89.  Hypertension stage 2   Systolic pressure: 140 or above.   Diastolic pressure: 90 or above.  What health risks are associated with hypertension?  Managing your hypertension is an important responsibility. Uncontrolled hypertension can lead to:   A heart attack.   A stroke.   A weakened blood vessel (aneurysm).   Heart failure.   Kidney damage.   Eye damage.   Metabolic syndrome.   Memory and concentration problems.  What changes can I make to manage my  hypertension?  Hypertension can be managed by making lifestyle changes and possibly by taking medicines. Your health care provider will help you make a plan to bring your blood pressure within a normal range.  Eating and drinking     Eat a diet that is high in fiber and potassium, and low in salt (sodium), added sugar, and fat. An example eating plan is called the DASH (Dietary Approaches to Stop Hypertension) diet. To eat this way:  ? Eat plenty of fresh fruits and vegetables. Try to fill half of your plate at each meal with fruits and vegetables.  ? Eat whole grains, such as whole wheat pasta, Rahl rice, or whole grain bread. Fill about one quarter of your plate with whole grains.  ? Eat low-fat diary products.  ? Avoid fatty cuts of meat, processed or cured meats, and poultry with skin. Fill about one quarter of your plate with lean proteins such as fish, chicken without skin, beans, eggs, and tofu.  ? Avoid premade and processed foods. These tend to be higher in sodium, added sugar, and fat.   Reduce your daily sodium intake. Most people with hypertension should eat less than 1,500 mg of sodium a day.   Limit alcohol intake to no more than 1 drink a day for nonpregnant women and 2 drinks a day for men. One drink equals 12 oz of beer, 5 oz of wine, or 1 oz of hard liquor.  Lifestyle   Work with your health care provider to maintain a healthy body weight, or to lose   weight. Ask what an ideal weight is for you.   Get at least 30 minutes of exercise that causes your heart to beat faster (aerobic exercise) most days of the week. Activities may include walking, swimming, or biking.   Include exercise to strengthen your muscles (resistance exercise), such as weight lifting, as part of your weekly exercise routine. Try to do these types of exercises for 30 minutes at least 3 days a week.   Do not use any products that contain nicotine or tobacco, such as cigarettes and e-cigarettes. If you need help quitting,  ask your health care provider.   Control any long-term (chronic) conditions you have, such as high cholesterol or diabetes.  Monitoring   Monitor your blood pressure at home as told by your health care provider. Your personal target blood pressure may vary depending on your medical conditions, your age, and other factors.   Have your blood pressure checked regularly, as often as told by your health care provider.  Working with your health care provider   Review all the medicines you take with your health care provider because there may be side effects or interactions.   Talk with your health care provider about your diet, exercise habits, and other lifestyle factors that may be contributing to hypertension.   Visit your health care provider regularly. Your health care provider can help you create and adjust your plan for managing hypertension.  Will I need medicine to control my blood pressure?  Your health care provider may prescribe medicine if lifestyle changes are not enough to get your blood pressure under control, and if:   Your systolic blood pressure is 130 or higher.   Your diastolic blood pressure is 80 or higher.  Take medicines only as told by your health care provider. Follow the directions carefully. Blood pressure medicines must be taken as prescribed. The medicine does not work as well when you skip doses. Skipping doses also puts you at risk for problems.  Contact a health care provider if:   You think you are having a reaction to medicines you have taken.   You have repeated (recurrent) headaches.   You feel dizzy.   You have swelling in your ankles.   You have trouble with your vision.  Get help right away if:   You develop a severe headache or confusion.   You have unusual weakness or numbness, or you feel faint.   You have severe pain in your chest or abdomen.   You vomit repeatedly.   You have trouble breathing.  Summary   Hypertension is when the force of blood pumping  through your arteries is too strong. If this condition is not controlled, it may put you at risk for serious complications.   Your personal target blood pressure may vary depending on your medical conditions, your age, and other factors. For most people, a normal blood pressure is less than 120/80.   Hypertension is managed by lifestyle changes, medicines, or both. Lifestyle changes include weight loss, eating a healthy, low-sodium diet, exercising more, and limiting alcohol.  This information is not intended to replace advice given to you by your health care provider. Make sure you discuss any questions you have with your health care provider.  Document Released: 05/28/2012 Document Revised: 08/01/2016 Document Reviewed: 08/01/2016  [employer]vier Interactive Patient Education  2019 [employer]

## 2018-11-26 NOTE — Assessment & Plan Note (Signed)
Controlled on Amlodipine and HCTZ, will continue Reinforced DASH diet and exercise for weight loss

## 2018-11-26 NOTE — Progress Notes (Signed)
Subjective:    Patient ID: Stacy [name], female    DOB: [birth year], 58 y.o.   MRN: [id number]  HPI  Pt presents to the clinic today for follow up of HTN. At her last visit, Amlodipine was added to HCTZ. She has been taking the medication as prescribed. She denies adverse side effects. Her BP today is 1228/2. ECG from 01/2017 reviewed.  Review of Systems      Past Medical History:  Diagnosis Date  . Anxiety   . Arthritis   . Cancer ([place])   . Complication of anesthesia    DIFFICULTY WAKING UP , LAST SURGERY NECK 2012 WAS FINE PER PT HERE AT [facility]  . Depression   . Fibromyalgia   . Genetic testing 12/21/2016   Genetic testing reported out on 12/20/2016 through [facility]'s 46-gene Common Hereditary Cancers panel found no deleterious mutations. The following 46 genes were analyzed: APC, ATM, AXIN2, BARD1, BMPR1A, BRCA1, BRCA2, BRIP1, CDH1, CDKN2A, CHEK2, CTNNA1, DICER1, EPCAM, GREM1, KIT, MEN1, MLH1, MSH2, MSH3, MSH6, MUTYH, NBN, NF1, NTHL1, PALB2, PDGFRA, PMS2, POLD1, POLE, PTEN, RAD50, RAD51C, RAD51D, SDH  . Headache    HX MIGRAINES    . Heart murmur   . History of radiation therapy 02/14/17-04/03/2017   left breast 60.4 Gy: 50.4 Gy delivered in 28 fractions and a boost of 10 Gy delivered in 5 fractions.  . Hypertension   . Malignant neoplasm of upper-outer quadrant of left female breast ([place]) 08/23/2016  . Mitral valve disorder   . MVP (mitral valve prolapse)    AS INFANT  . Neck pain   . Personal history of chemotherapy   . Personal history of radiation therapy   . Vision abnormalities     Current Outpatient Medications  Medication Sig Dispense Refill  . alum & mag hydroxide-simeth (MAALOX/MYLANTA) 200-200-20 MG/5ML suspension Take 15 mLs by mouth every 6 (six) hours as needed for indigestion or heartburn.    Marland Kitchen amLODipine (NORVASC) 5 MG tablet Take 1 tablet (5 mg total) by mouth daily. 30 tablet 2  . clarithromycin (BIAXIN) 500 MG tablet Take 1 tablet (500 mg total) by  mouth 2 (two) times daily. 28 tablet 0  . hydrochlorothiazide (HYDRODIURIL) 25 MG tablet Take 1 tablet (25 mg total) by mouth daily. 90 tablet 0  . ibuprofen (ADVIL,MOTRIN) 200 MG tablet Take 400 mg by mouth every 6 (six) hours as needed for mild pain.    . metroNIDAZOLE (FLAGYL) 500 MG tablet Take 1 tablet (500 mg total) by mouth 3 (three) times daily. 42 tablet 0  . ondansetron (ZOFRAN) 4 MG tablet Take 1 tablet (4 mg total) by mouth every 6 (six) hours as needed for nausea or vomiting. 12 tablet 0  . pantoprazole (PROTONIX) 40 MG tablet Take 1 tablet (40 mg total) by mouth daily. 28 tablet 0   No current facility-administered medications for this visit.     Allergies  Allergen Reactions  . Gabapentin Swelling    "makes me feel detached"  . Naproxen Palpitations  . Neulasta [Pegfilgrastim] Itching and Other (See Comments)    Patient stated,"reddish skin tone, body on fire, tingly tongue."  . Erythromycin Nausea And Vomiting  . Gabapentin Other (See Comments)    "Makes me feel out of my body"  . Neulasta [Pegfilgrastim] Itching  . Tramadol Other (See Comments)    headache  . Erythromycin Nausea And Vomiting  . Naproxen Palpitations  . Tramadol Hcl Other (See Comments)    Headaches    Family  History  Adopted: Yes  Problem Relation Age of Onset  . Breast cancer Brother 33       Maternal half-brother. Currently 44.  Marland Kitchen BRCA 1/2 Neg Hx     Social History   Socioeconomic History  . Marital status: Married    Spouse name: Not on file  . Number of children: 4  . Years of education: Not on file  . Highest education level: Not on file  Occupational History  . Not on file  Social Needs  . Financial resource strain: Not on file  . Food insecurity:    Worry: Not on file    Inability: Not on file  . Transportation needs:    Medical: Not on file    Non-medical: Not on file  Tobacco Use  . Smoking status: Never Smoker  . Smokeless tobacco: Never Used  Substance and Sexual  Activity  . Alcohol use: Not Currently  . Drug use: Not Currently  . Sexual activity: Yes    Birth control/protection: Post-menopausal  Lifestyle  . Physical activity:    Days per week: Not on file    Minutes per session: Not on file  . Stress: Not on file  Relationships  . Social connections:    Talks on phone: Not on file    Gets together: Not on file    Attends religious service: Not on file    Active member of club or organization: Not on file    Attends meetings of clubs or organizations: Not on file    Relationship status: Not on file  . Intimate partner violence:    Fear of current or ex partner: Not on file    Emotionally abused: Not on file    Physically abused: Not on file    Forced sexual activity: Not on file  Other Topics Concern  . Not on file  Social History Narrative   ** Merged History Encounter **         Constitutional: Denies fever, malaise, fatigue, headache or abrupt weight changes.  Respiratory: Denies difficulty breathing, shortness of breath, cough or sputum production.   Cardiovascular: Denies chest pain, chest tightness, palpitations or swelling in the hands or feet.   No other specific complaints in a complete review of systems (except as listed in HPI above).  Objective:   Physical Exam   BP 122/82   Pulse 78   Temp 98 F (36.7 C) (Oral)   Wt 226 lb (102.5 kg)   LMP 11/07/2016   SpO2 98%   BMI 38.79 kg/m  Wt Readings from Last 3 Encounters:  11/26/18 226 lb (102.5 kg)  11/07/18 227 lb (103 kg)  07/16/18 231 lb (104.8 kg)    General: Appears her stated age, obese, in NAD. Cardiovascular: Normal rate and rhythm. S1,S2 noted.  No murmur, rubs or gallops noted.  Pulmonary/Chest: Normal effort and positive vesicular breath sounds. No respiratory distress. No wheezes, rales or ronchi noted.  Neurological: Alert and oriented.  Psychiatric: Mood and affect normal. Behavior is normal. Judgment and thought content normal.     BMET     Component Value Date/Time   NA 135 11/01/2018 1657   NA 138 09/11/2017 0902   K 3.8 11/01/2018 1657   K 3.5 09/11/2017 0902   CL 102 11/01/2018 1657   CO2 24 11/01/2018 1657   CO2 25 09/11/2017 0902   GLUCOSE 109 (H) 11/01/2018 1657   GLUCOSE 98 09/11/2017 0902   BUN 19 11/01/2018 1657  BUN 18.2 09/11/2017 0902   CREATININE 0.92 11/01/2018 1657   CREATININE 1.0 09/11/2017 0902   CALCIUM 9.1 11/01/2018 1657   CALCIUM 9.4 09/11/2017 0902   GFRNONAA >60 11/01/2018 1657   GFRAA >60 11/01/2018 1657    Lipid Panel     Component Value Date/Time   CHOL 174 11/07/2018 1432   TRIG 117.0 11/07/2018 1432   HDL 55.60 11/07/2018 1432   CHOLHDL 3 11/07/2018 1432   VLDL 23.4 11/07/2018 1432   LDLCALC 95 11/07/2018 1432    CBC    Component Value Date/Time   WBC 14.1 (H) 11/01/2018 1657   RBC 4.75 11/01/2018 1657   HGB 14.5 11/01/2018 1657   HGB 13.0 09/11/2017 0902   HCT 44.0 11/01/2018 1657   HCT 38.6 09/11/2017 0902   PLT 297 11/01/2018 1657   PLT 263 09/11/2017 0902   MCV 92.6 11/01/2018 1657   MCV 88.7 09/11/2017 0902   MCH 30.5 11/01/2018 1657   MCHC 33.0 11/01/2018 1657   RDW 13.1 11/01/2018 1657   RDW 13.4 09/11/2017 0902   LYMPHSABS 2.8 11/01/2018 1657   LYMPHSABS 1.4 09/11/2017 0902   MONOABS 1.3 (H) 11/01/2018 1657   MONOABS 0.8 09/11/2017 0902   EOSABS 0.1 11/01/2018 1657   EOSABS 0.2 09/11/2017 0902   BASOSABS 0.1 11/01/2018 1657   BASOSABS 0.0 09/11/2017 0902    Hgb A1C Lab Results  Component Value Date   HGBA1C 5.7 11/07/2018           Assessment & Plan:

## 2018-12-03 ENCOUNTER — Encounter: Payer: Self-pay | Admitting: Internal Medicine

## 2018-12-09 IMAGING — DX DG CHEST 2V
2 series · 2 of 2 positions shown · non-contrast
Comparison: October 11, 2016

CLINICAL DATA: Two day history of cough. History of breast
carcinoma

EXAM:
CHEST  2 VIEW

[chest pa]
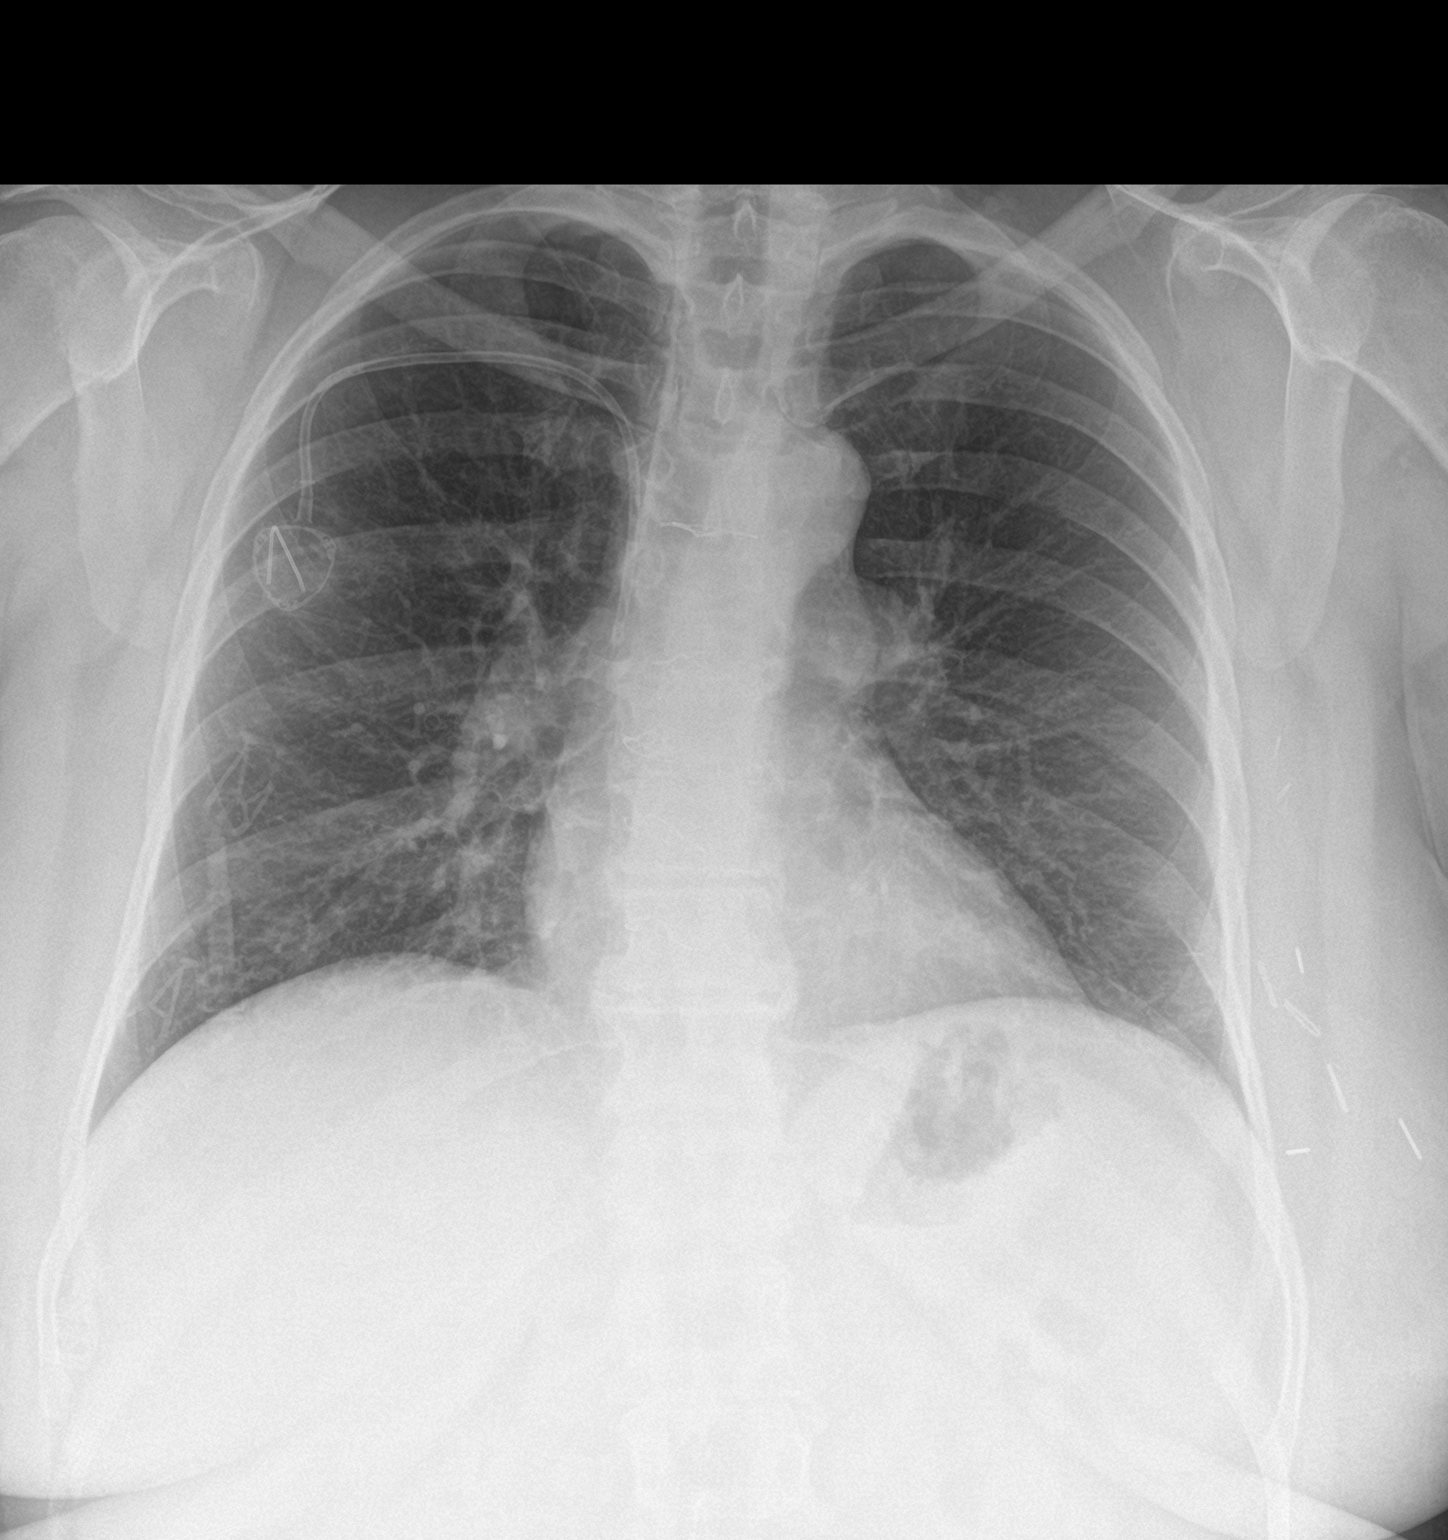

[chest lat]
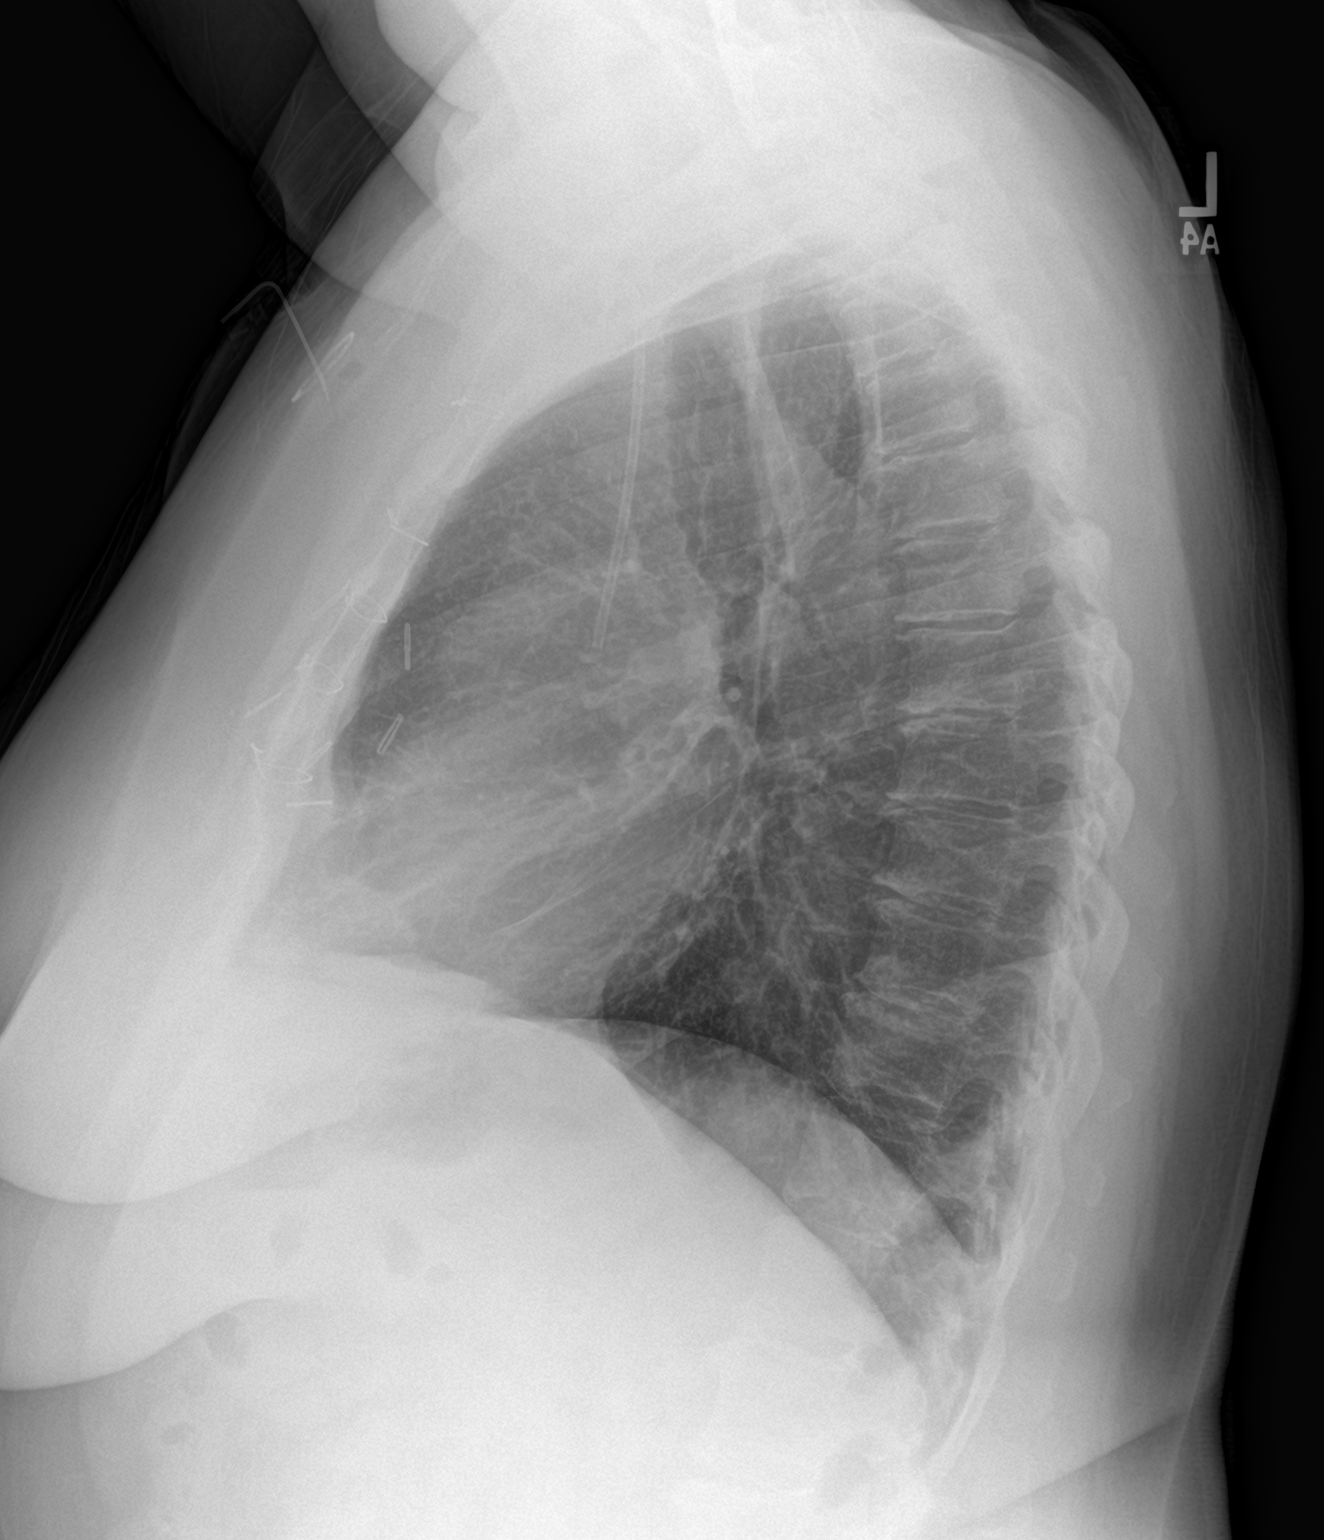

[2 of 2 positions shown; findings below may reference images not displayed]

FINDINGS: There is no edema or consolidation. Heart size and pulmonary
vascularity are normal. No adenopathy. There are surgical clips in
the lateral left breast region. Port-A-Cath tip is in the superior
vena cava. No adenopathy. There is postoperative change in the lower
cervical spine region. There is mild degenerative change in the
thoracic spine. No blastic or lytic bone lesions.
IMPRESSION: No edema or consolidation.  No adenopathy evident.

## 2018-12-11 IMAGING — CR DG CHEST 2V
2 series · 2 of 2 positions shown · non-contrast
Comparison: Chest radiograph performed 11/07/2016

CLINICAL DATA: Acute onset of chills and leukocytosis. Recent
chemotherapy for left breast cancer. Initial encounter.

EXAM:
CHEST  2 VIEW

[w chest pa]
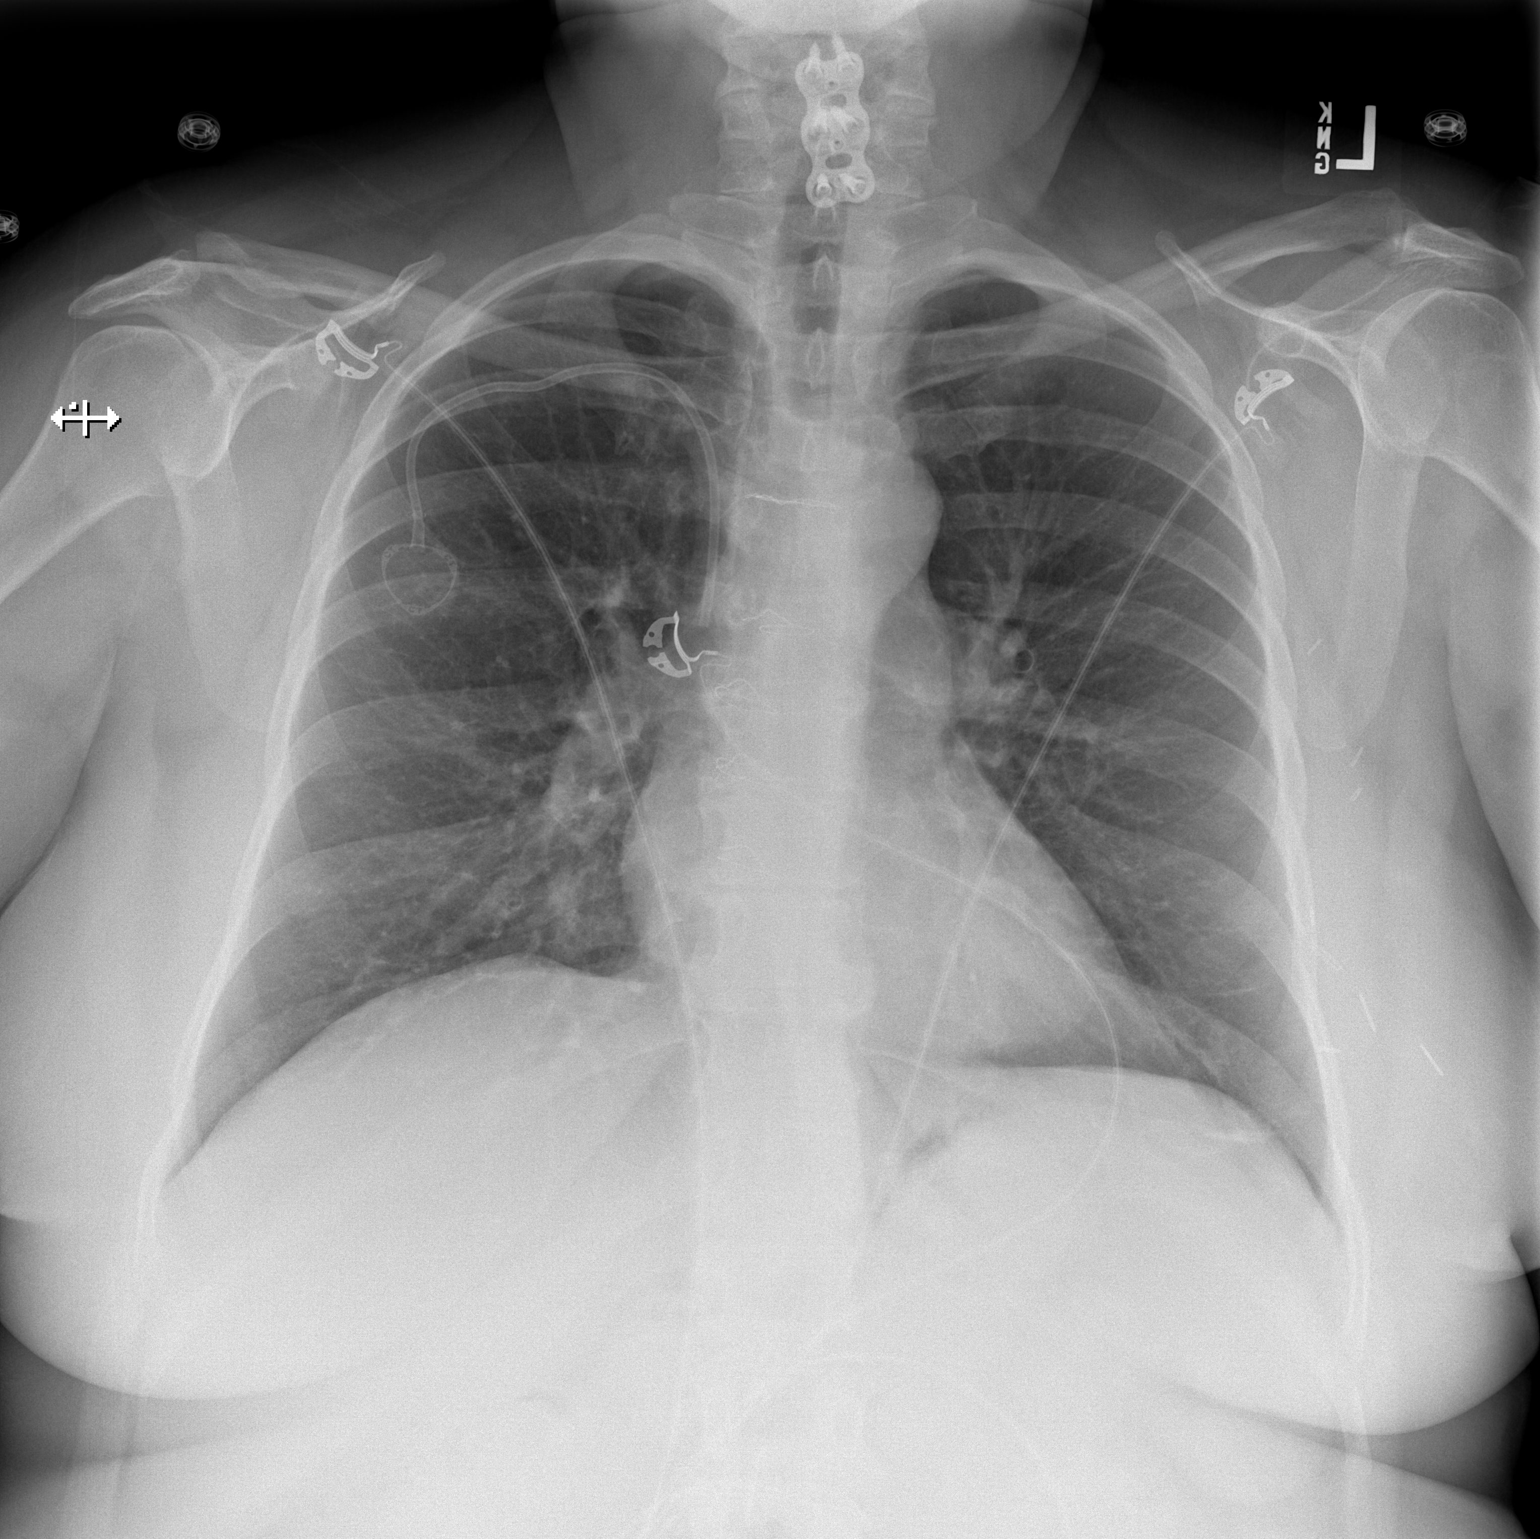

[w chest lat]
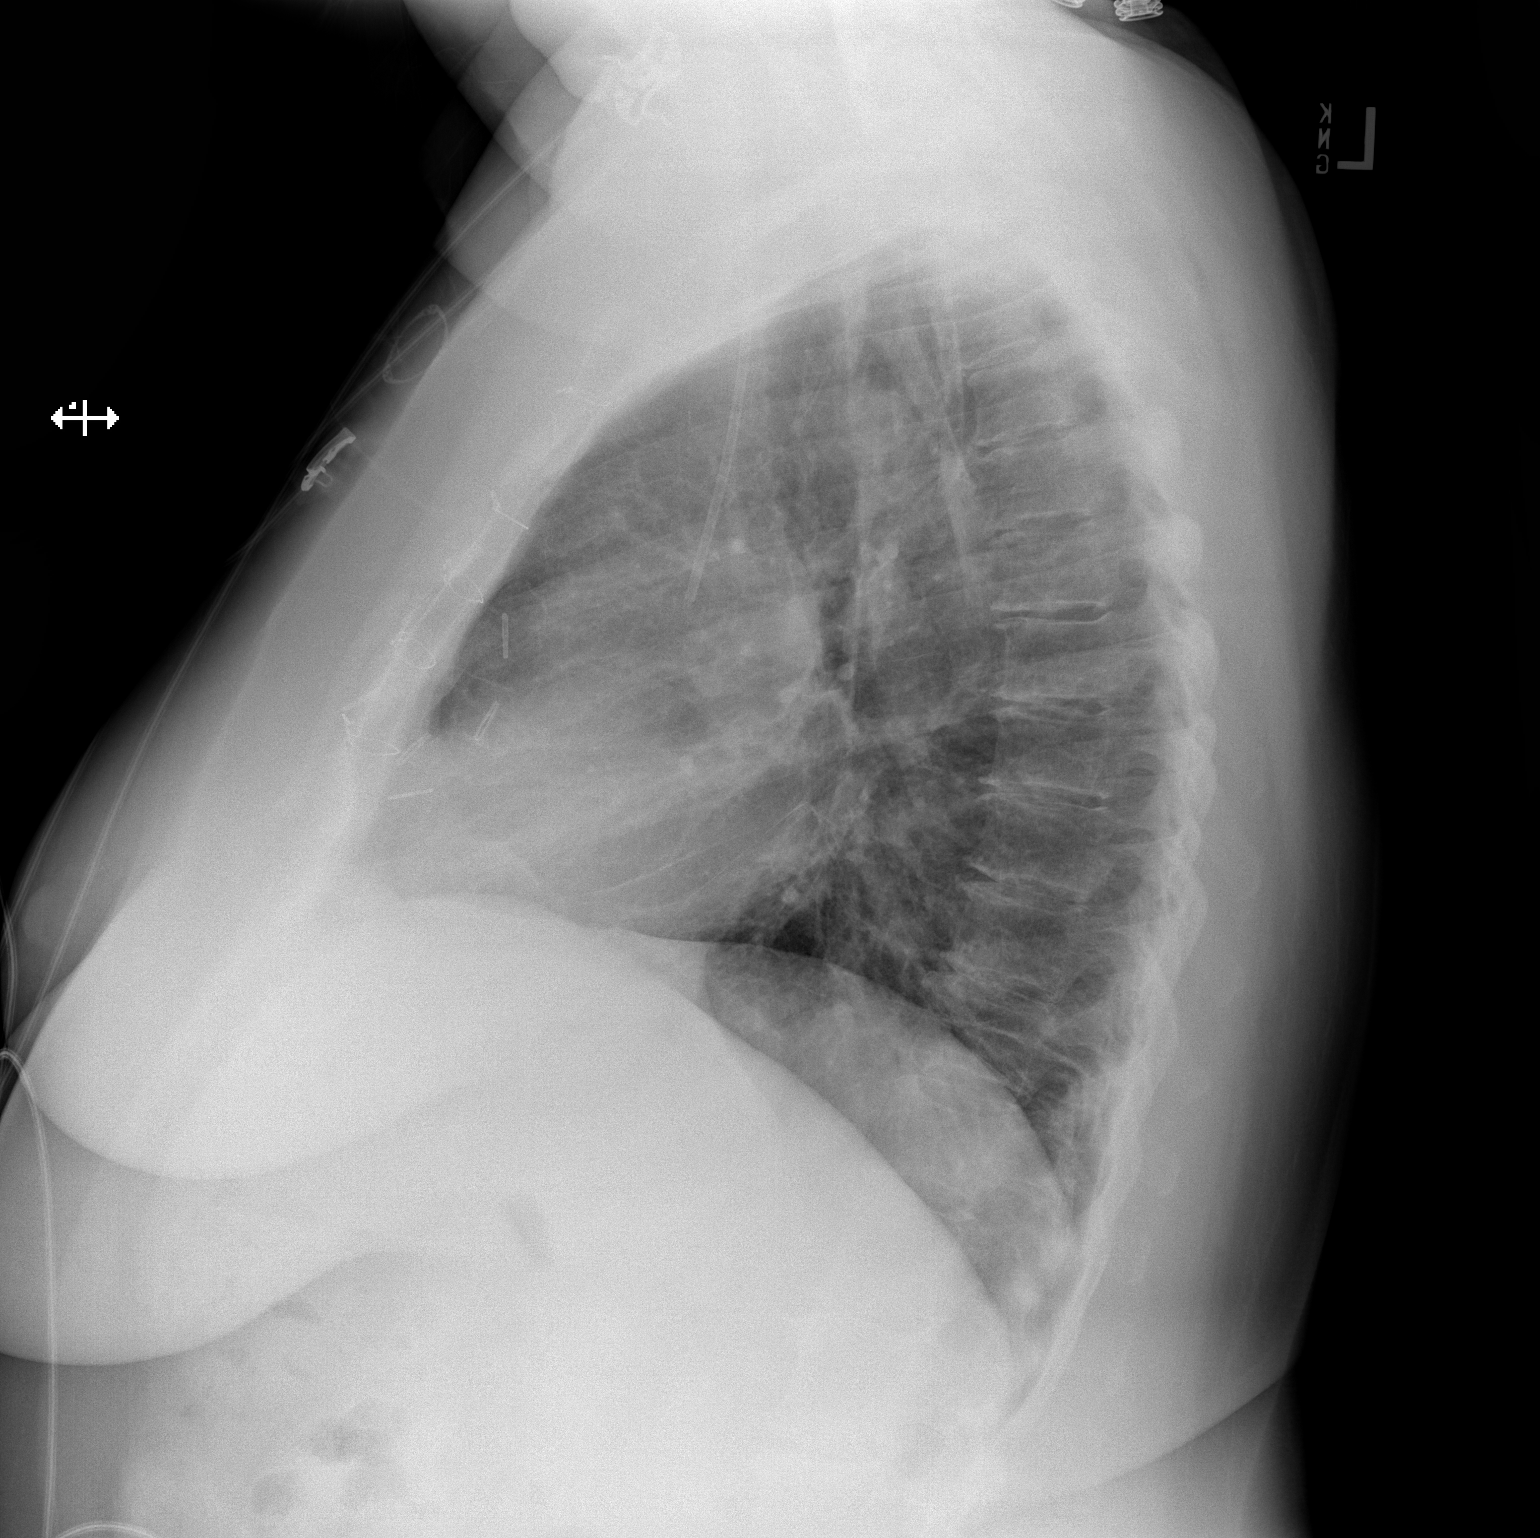

[2 of 2 positions shown; findings below may reference images not displayed]

FINDINGS: The lungs are well-aerated and clear. There is no evidence of focal
opacification, pleural effusion or pneumothorax.

The heart is normal in size; the mediastinal contour is within
normal limits. No acute osseous abnormalities are seen. A
right-sided chest port is noted ending about the mid SVC. Cervical
spinal fusion hardware is noted. Scattered clips are noted at the
left axilla.
IMPRESSION: No acute cardiopulmonary process seen.

## 2018-12-12 ENCOUNTER — Encounter: Payer: Self-pay | Admitting: Internal Medicine

## 2018-12-18 ENCOUNTER — Encounter: Payer: Self-pay | Admitting: Internal Medicine

## 2018-12-19 ENCOUNTER — Encounter: Payer: Self-pay | Admitting: Hematology and Oncology

## 2019-01-07 ENCOUNTER — Other Ambulatory Visit: Payer: Self-pay | Admitting: Internal Medicine

## 2019-02-03 ENCOUNTER — Encounter: Payer: Self-pay | Admitting: Internal Medicine

## 2019-02-03 ENCOUNTER — Telehealth: Payer: Self-pay | Admitting: Internal Medicine

## 2019-02-03 NOTE — Telephone Encounter (Signed)
FMLA paperwork in [employer] in box For review and signature

## 2019-02-03 NOTE — Telephone Encounter (Signed)
Done, given back to [name] 

## 2019-02-04 NOTE — Telephone Encounter (Signed)
Paperwork has been pt Pt aware Copy for pt Copy for scan

## 2019-06-23 ENCOUNTER — Other Ambulatory Visit: Payer: Self-pay | Admitting: Hematology and Oncology

## 2019-06-23 DIAGNOSIS — Z9889 Other specified postprocedural states: Secondary | ICD-10-CM

## 2019-08-06 ENCOUNTER — Encounter: Payer: Self-pay | Admitting: Internal Medicine

## 2019-08-07 DIAGNOSIS — Z0279 Encounter for issue of other medical certificate: Secondary | ICD-10-CM

## 2019-08-07 NOTE — Telephone Encounter (Signed)
Done, given back to [name] 

## 2019-08-07 NOTE — Telephone Encounter (Signed)
fmla paperwork in Stacy [name]'s in box for review and signature

## 2019-08-10 ENCOUNTER — Ambulatory Visit
Admission: RE | Admit: 2019-08-10 | Discharge: 2019-08-10 | Disposition: A | Discharge status: Home / Self Care | Payer: BC Managed Care – PPO | Source: Ambulatory Visit | Attending: Hematology and Oncology | Admitting: Hematology and Oncology

## 2019-08-10 ENCOUNTER — Other Ambulatory Visit: Payer: Self-pay

## 2019-08-10 DIAGNOSIS — Z9889 Other specified postprocedural states: Secondary | ICD-10-CM

## 2019-08-10 DIAGNOSIS — R928 Other abnormal and inconclusive findings on diagnostic imaging of breast: Secondary | ICD-10-CM | POA: Diagnosis not present

## 2019-08-10 DIAGNOSIS — Z853 Personal history of malignant neoplasm of breast: Secondary | ICD-10-CM | POA: Diagnosis not present

## 2019-08-10 NOTE — Telephone Encounter (Signed)
Paperwork faxed Copy for pt Copy for scan Copy for billing  Sent my chart message letting pt know paperwork has been faxed

## 2019-08-19 ENCOUNTER — Other Ambulatory Visit: Payer: Self-pay

## 2019-08-19 ENCOUNTER — Telehealth: Payer: BC Managed Care – PPO | Admitting: Family

## 2019-08-19 DIAGNOSIS — Z20828 Contact with and (suspected) exposure to other viral communicable diseases: Secondary | ICD-10-CM

## 2019-08-19 DIAGNOSIS — Z20822 Contact with and (suspected) exposure to covid-19: Secondary | ICD-10-CM

## 2019-08-19 MED ORDER — BENZONATATE 100 MG PO CAPS: 100.0000 mg | ORAL_CAPSULE | Freq: Three times a day (TID) | ORAL | 0 refills | Status: DC | PRN

## 2019-08-19 NOTE — Progress Notes (Signed)
E-Visit for Corona Virus Screening   Your current symptoms could be consistent with the coronavirus.  Many health care providers can now test patients at their office but not all are.  [place] has multiple testing sites. For information on our COVID testing locations and hours go to [email or web address]  Please quarantine yourself while awaiting your test results.  We are enrolling you in our [place] for [place] . Daily you will receive a questionnaire within the [place] website. Our COVID 19 response team willl be monitoriing your responses daily. Please continue good preventive care measures, including:  frequent hand-washing, avoid touching your face, cover coughs/sneezes, stay out of crowds and keep a 6 foot distance from others.     You can go to one of the testing sites listed below, while they are opened (see hours). You do not need an order and will stay in your car during the test. You do need to self isolate until your results return and if positive 14 days from when your symptoms started and until you are 3 days fever free.   Testing Locations (Monday - Friday, 10 a.m. - 3 p.m.) . [place]: [facility], [address] [place], [place]: [place], [place], [place], [place] (entrance off [address])  . [facility]: (Closed each Monday): Testing site relocated to the short stay covered drive at [facility] (Use the [facility] entrance to [facility] next to [place] is a respiratory illness with symptoms that are similar to the flu. Symptoms are typically mild to moderate, but there have been cases of severe illness and death due to the virus. The following symptoms may appear 2-14 days after exposure: . Fever . Cough . Shortness of breath or difficulty breathing . Chills . Repeated shaking with  chills . Muscle pain . Headache . Sore throat . New loss of taste or smell . Fatigue . Congestion or runny nose . Nausea or vomiting . Diarrhea  If you develop fever/cough/breathlessness, please stay home for 10 days with improving symptoms and until you have had 24 hours of no fever (without taking a fever reducer).  Go to the nearest hospital ED for assessment if fever/cough/breathlessness are severe or illness seems like a threat to life.  It is vitally important that if you feel that you have an infection such as this virus or any other virus that you stay home and away from places where you may spread it to others.  You should avoid contact with people age 64 and older.   You should wear a mask or cloth face covering over your nose and mouth if you must be around other people or animals, including pets (even at home). Try to stay at least 6 feet away from other people. This will protect the people around you.  You can use medication such as A prescription cough medication called Tessalon Perles 100 mg. You may take 1-2 capsules every 8 hours as needed for cough  You may also take acetaminophen (Tylenol) as needed for fever.   Reduce your risk of any infection by using the same precautions used for avoiding the common cold or flu:  Marland Kitchen Wash your hands often with soap and warm water for at least 20 seconds.  If soap and water are not readily available, use an alcohol-based hand sanitizer with at least 60% alcohol.  . If coughing  or sneezing, cover your mouth and nose by coughing or sneezing into the elbow areas of your shirt or coat, into a tissue or into your sleeve (not your hands). . Avoid shaking hands with others and consider head nods or verbal greetings only. . Avoid touching your eyes, nose, or mouth with unwashed hands.  . Avoid close contact with people who are sick. . Avoid places or events with large numbers of people in one location, like concerts or sporting  events. . Carefully consider travel plans you have or are making. . If you are planning any travel outside or inside the [place], visit the CDC's Travelers' Health webpage for the latest health notices. . If you have some symptoms but not all symptoms, continue to monitor at home and seek medical attention if your symptoms worsen. . If you are having a medical emergency, call 911.  HOME CARE . Only take medications as instructed by your medical team. . Drink plenty of fluids and get plenty of rest. . A steam or ultrasonic humidifier can help if you have congestion.   GET HELP RIGHT AWAY IF YOU HAVE EMERGENCY WARNING SIGNS** FOR COVID-19. If you or someone is showing any of these signs seek emergency medical care immediately. Call 911 or proceed to your closest emergency facility if: . You develop worsening high fever. . Trouble breathing . Bluish lips or face . Persistent pain or pressure in the chest . New confusion . Inability to wake or stay awake . You cough up blood. . Your symptoms become more severe  **This list is not all possible symptoms. Contact your medical provider for any symptoms that are sever or concerning to you.   MAKE SURE YOU   Understand these instructions.  Will watch your condition.  Will get help right away if you are not doing well or get worse.  Your e-visit answers were reviewed by a board certified advanced clinical practitioner to complete your personal care plan.  Depending on the condition, your plan could have included both over the counter or prescription medications.  If there is a problem please reply once you have received a response from your provider.  Your safety is important to [place].  If you have drug allergies check your prescription carefully.    You can use MyChart to ask questions about today's visit, request a non-urgent call back, or ask for a work or school excuse for 24 hours related to this e-Visit. If it has been greater than 24 hours  you will need to follow up with your provider, or enter a new e-Visit to address those concerns. You will get an e-mail in the next two days asking about your experience.  I hope that your e-visit has been valuable and will speed your recovery. Thank you for using e-visits.  Approximately 5 minutes was spent documenting and reviewing patient's chart.

## 2019-08-20 ENCOUNTER — Encounter (INDEPENDENT_AMBULATORY_CARE_PROVIDER_SITE_OTHER): Payer: Self-pay

## 2019-08-21 ENCOUNTER — Telehealth: Payer: Self-pay

## 2019-08-21 ENCOUNTER — Encounter (INDEPENDENT_AMBULATORY_CARE_PROVIDER_SITE_OTHER): Payer: Self-pay

## 2019-08-21 NOTE — Telephone Encounter (Signed)
Patient advised per protocol on cough and temperature:   Temperature is the same and is noted to be less than 100.4: Continue to monitor at home.   Advise patient to stay hydrated, push oral fluids if able, and advise pt. to avoid using multiple blankets and layer of clothing to prevent overheating.   Avoid over use of anti-pyretic medications for fevers less than 101 degrees Fahrenheit.   Fever helps fight infection.   Worsening temperature, treat if temperature is > 101: treat with OTC anti-fever medications (Tylenol and/or Ibuprofen)   May alternate Tylenol and Ibuprofen every 3 hours as needed for fever greater than 101. Example: Tylenol at 9AM, Ibuprofen at 12PM, Tylenol at 3PM, Ibuprofen at 6PM, Tylenol at 9PM, Ibuprofen at 12AM, Tylenol at 3AM, Ibuprofen at 6AM. Then restart rotation with Tylenol at 9AM.   If fever remains for greater than 3 days, notify PCP.   If fever becomes greater than 103 and unable to reduce with over the counter medication, contact PCP.   If cough remains the same or better: continue to treat with over the counter medications. Hard candy or cough drops and drinking warm fluids. Adults can also use honey 2 tsp (10 ML) at bedtime.   HONEY IS NOT RECOMMENDED FOR INFANTS UNDER ONE.  If cough is becoming worse even with the use of over the counter medications and patient is not able to sleep at night, cough becomes productive with sputum that maybe yellow or green in color, contact PCP.  Patient states that she has not took her temperature today but doesn't feel like she has one. Patient is having some burning in her chest and back. Patient will contact her pcp today. Patient will continue to monitor her symptoms. Patient verbalized understanding

## 2019-08-22 LAB — NOVEL CORONAVIRUS, NAA: SARS-CoV-2, NAA: DETECTED — AB

## 2019-08-23 ENCOUNTER — Encounter (INDEPENDENT_AMBULATORY_CARE_PROVIDER_SITE_OTHER): Payer: Self-pay

## 2019-08-23 ENCOUNTER — Encounter: Payer: Self-pay | Admitting: Nurse Practitioner

## 2019-08-23 ENCOUNTER — Other Ambulatory Visit: Payer: Self-pay | Admitting: Nurse Practitioner

## 2019-08-23 ENCOUNTER — Telehealth: Payer: Self-pay

## 2019-08-23 ENCOUNTER — Telehealth: Payer: Self-pay | Admitting: Nurse Practitioner

## 2019-08-23 DIAGNOSIS — Z6838 Body mass index (BMI) 38.0-38.9, adult: Secondary | ICD-10-CM | POA: Insufficient documentation

## 2019-08-23 DIAGNOSIS — U071 COVID-19: Secondary | ICD-10-CM

## 2019-08-23 NOTE — Telephone Encounter (Signed)
  I connected by phone with Stacy [name] on 08/23/2019 at 10:24 AM to discuss the potential use of an new treatment for mild to moderate COVID-19 viral infection in non-hospitalized patients.  Her current symptoms started on Tuesday 08/18/19 and have not improved.  Cough, myalgia, and headache.  Positive test results returned 08/22/19.  This patient is a 58 y.o. female that meets the FDA criteria for Emergency Use Authorization of bamlanivimab:  Has a (+) direct SARS-CoV-2 viral test result  Has mild or moderate COVID-19   Is ? 58 years of age and weighs ? 40 kg  Is NOT hospitalized due to COVID-19  Is NOT requiring oxygen therapy or requiring an increase in baseline oxygen flow rate due to COVID-19  Is within 10 days of symptom onset  Has at least one of the high risk factor(s) for progression to severe COVID-19 and/or hospitalization as defined in EUA.  Specific high risk criteria : BMI >/= 35 (38.79 in March)  Reviewed patient chronic problem list, which is currently managed by their PCP.  I have spoken and communicated the following to the patient or parent/caregiver:  1. FDA has authorized the emergency use of bamlanivimab for the treatment of mild to moderate COVID-19 in adults and pediatric patients with positive results of direct SARS-CoV-2 viral testing who are 22 years of age and older weighing at least 40 kg, and who are at high risk for progressing to severe COVID-19 and/or hospitalization.  2. The significant known and potential risks and benefits of bamlanivimab, and the extent to which such potential risks and benefits are unknown.  3. Information on available alternative treatments and the risks and benefits of those alternatives, including clinical trials.  4. Patients treated with bamlanivimab should continue to self-isolate and use infection control measures (e.g., wear mask, isolate, social distance, avoid sharing personal items, clean and disinfect "high touch"  surfaces, and frequent handwashing) according to CDC guidelines.   5. The patient or parent/caregiver has the option to accept or refuse bamlanivimab.  After reviewing this information with the patient, The patient agreed to proceed with receiving the infusion of bamlanivimab and will be provided a copy of the Fact sheet prior to receiving the infusion.  Discussed with her to alert her insurance company, [employer], to fact she will be obtaining transfusion.  Barbaraann Faster Duwayne Matters 08/23/2019 10:24 AM

## 2019-08-23 NOTE — Telephone Encounter (Signed)
Last fever 102 and this morning woke up covered in sweat. Pt has not taken temperature. Pt c/o chest pain to left rib area. No help with Tylenol or Ibuprofen. Pt denies SOB. Chest pain. Advised to drink push fluids. Advised to not treat fever if less than 101.0 Advised pt to call PCP if fever remain greater than 3 days and to call PCP if fever remains even after taking Tylenol. Advised pt to call 91 if becomes SOB or if chest pain worsens. Advised pt to take 2 tsp of honey at bedtime. Advised pt to drink warm fluids as well to help relax airway. Pt verbalized understanding.

## 2019-08-23 NOTE — Progress Notes (Signed)
BAM orders for 121/04/2019.  Alert sent to Jonelle Sidle to add to Tuesday schedule.

## 2019-08-23 NOTE — Telephone Encounter (Signed)
Scheduled for Tuesday 12/8 0830

## 2019-08-24 ENCOUNTER — Encounter: Payer: Self-pay | Admitting: Internal Medicine

## 2019-08-25 ENCOUNTER — Ambulatory Visit (HOSPITAL_COMMUNITY)
Admission: RE | Admit: 2019-08-25 | Discharge: 2019-08-25 | Disposition: A | Discharge status: Home / Self Care | Payer: BC Managed Care – PPO | Source: Ambulatory Visit | Attending: Pulmonary Disease | Admitting: Pulmonary Disease

## 2019-08-25 ENCOUNTER — Encounter: Payer: Self-pay | Admitting: Internal Medicine

## 2019-08-25 ENCOUNTER — Other Ambulatory Visit: Payer: Self-pay

## 2019-08-25 ENCOUNTER — Ambulatory Visit (INDEPENDENT_AMBULATORY_CARE_PROVIDER_SITE_OTHER): Payer: BC Managed Care – PPO | Admitting: Internal Medicine

## 2019-08-25 DIAGNOSIS — U071 COVID-19: Secondary | ICD-10-CM | POA: Insufficient documentation

## 2019-08-25 MED ORDER — EPINEPHRINE 0.3 MG/0.3ML IJ SOAJ: 0.3000 mg | Freq: Once | INTRAMUSCULAR | Status: DC | PRN

## 2019-08-25 MED ORDER — FAMOTIDINE IN NACL 20-0.9 MG/50ML-% IV SOLN: 20.0000 mg | Freq: Once | INTRAVENOUS | Status: DC | PRN

## 2019-08-25 MED ORDER — SODIUM CHLORIDE 0.9 % IV SOLN
700.0000 mg | Freq: Once | INTRAVENOUS | Status: AC
  Administered 2019-08-25: 700 mg via INTRAVENOUS
  Filled 2019-08-25: qty 20

## 2019-08-25 MED ORDER — DIPHENHYDRAMINE HCL 50 MG/ML IJ SOLN: 50.0000 mg | Freq: Once | INTRAMUSCULAR | Status: DC | PRN

## 2019-08-25 MED ORDER — ALBUTEROL SULFATE HFA 108 (90 BASE) MCG/ACT IN AERS: 2.0000 | INHALATION_SPRAY | Freq: Once | RESPIRATORY_TRACT | Status: DC | PRN

## 2019-08-25 MED ORDER — SODIUM CHLORIDE 0.9 % IV SOLN: INTRAVENOUS | Status: DC | PRN

## 2019-08-25 MED ORDER — METHYLPREDNISOLONE SODIUM SUCC 125 MG IJ SOLR: 125.0000 mg | Freq: Once | INTRAMUSCULAR | Status: DC | PRN

## 2019-08-25 NOTE — Telephone Encounter (Signed)
fmla paperwork in [facility] in box  For review and signature

## 2019-08-25 NOTE — Progress Notes (Signed)
  Diagnosis: COVID-19  Physician: Dr. Joya Gaskins  Procedure: [facility]fusion [facility] Med: bamlanivimab infusion - Provided patient with bamlanimivab fact sheet for patients, parents and caregivers prior to infusion.  Complications: No immediate complications noted.  Discharge: Discharged home   [place], [place] 08/25/2019

## 2019-08-25 NOTE — Telephone Encounter (Signed)
Paperwork faxed °

## 2019-08-25 NOTE — Progress Notes (Signed)
Virtual Visit via Video Note  I connected with Stacy [name] on 08/25/19 at  3:00 PM EST by a video enabled telemedicine application and verified that I am speaking with the correct person using two identifiers.  Location: Patient: Home Provider: Office   I discussed the limitations of evaluation and management by telemedicine and the availability of in person appointments. The patient expressed understanding and agreed to proceed.  History of Present Illness:  Pt recently diagnosed with COVID 19 on 12/2. Her symptoms include fever, body aches, loss of taste and smell, cough and nausea. Her fever has been as high as 102.2. The cough is dry and non productive. She denies headache, runny nose, nasal congestion, ear pain, sore throat, SOB, vomiting or diarrhea. She is taking Ibuprofen, Tylenol, Delsym and Zofran for her symptoms. She has had a plasma infusion. She would like FMLA forms to be completed to cover her from 08/19/19-09/02/19.   Past Medical History:  Diagnosis Date  . Anxiety   . Arthritis   . Cancer ([place])   . Complication of anesthesia    DIFFICULTY WAKING UP , LAST SURGERY NECK 2012 WAS FINE PER PT HERE AT [facility]  . Depression   . Fibromyalgia   . Genetic testing 12/21/2016   Genetic testing reported out on 12/20/2016 through [facility]'s 46-gene Common Hereditary Cancers panel found no deleterious mutations. The following 46 genes were analyzed: APC, ATM, AXIN2, BARD1, BMPR1A, BRCA1, BRCA2, BRIP1, CDH1, CDKN2A, CHEK2, CTNNA1, DICER1, EPCAM, GREM1, KIT, MEN1, MLH1, MSH2, MSH3, MSH6, MUTYH, NBN, NF1, NTHL1, PALB2, PDGFRA, PMS2, POLD1, POLE, PTEN, RAD50, RAD51C, RAD51D, SDH  . Headache    HX MIGRAINES    . Heart murmur   . History of radiation therapy 02/14/17-04/03/2017   left breast 60.4 Gy: 50.4 Gy delivered in 28 fractions and a boost of 10 Gy delivered in 5 fractions.  . Hypertension   . Malignant neoplasm of upper-outer quadrant of left female breast ([place]) 08/23/2016  .  Mitral valve disorder   . MVP (mitral valve prolapse)    AS INFANT  . Neck pain   . Personal history of chemotherapy   . Personal history of radiation therapy   . Vision abnormalities     Current Outpatient Medications  Medication Sig Dispense Refill  . alum & mag hydroxide-simeth (MAALOX/MYLANTA) 200-200-20 MG/5ML suspension Take 15 mLs by mouth every 6 (six) hours as needed for indigestion or heartburn.    Marland Kitchen amLODipine (NORVASC) 5 MG tablet Take 1 tablet (5 mg total) by mouth daily. 30 tablet 2  . benzonatate (TESSALON PERLES) 100 MG capsule Take 1 capsule (100 mg total) by mouth 3 (three) times daily as needed. 20 capsule 0  . hydrochlorothiazide (HYDRODIURIL) 25 MG tablet TAKE 1 TABLET BY MOUTH EVERY DAY 90 tablet 1  . ibuprofen (ADVIL,MOTRIN) 200 MG tablet Take 400 mg by mouth every 6 (six) hours as needed for mild pain.    Marland Kitchen ondansetron (ZOFRAN) 4 MG tablet Take 1 tablet (4 mg total) by mouth every 6 (six) hours as needed for nausea or vomiting. 12 tablet 0  . pantoprazole (PROTONIX) 40 MG tablet Take 1 tablet (40 mg total) by mouth daily. 28 tablet 0   No current facility-administered medications for this visit.    Facility-Administered Medications Ordered in Other Visits  Medication Dose Route Frequency Provider Last Rate Last Dose  . 0.9 %  sodium chloride infusion   Intravenous PRN Cannady, Jolene T, NP      . albuterol (  VENTOLIN HFA) 108 (90 Base) MCG/ACT inhaler 2 puff  2 puff Inhalation Once PRN Cannady, Jolene T, NP      . diphenhydrAMINE (BENADRYL) injection 50 mg  50 mg Intravenous Once PRN Cannady, Jolene T, NP      . EPINEPHrine (EPI-PEN) injection 0.3 mg  0.3 mg Intramuscular Once PRN Cannady, Jolene T, NP      . famotidine (PEPCID) IVPB 20 mg premix  20 mg Intravenous Once PRN Cannady, Jolene T, NP      . methylPREDNISolone sodium succinate (SOLU-MEDROL) 125 mg/2 mL injection 125 mg  125 mg Intravenous Once PRN Cannady, Jolene T, NP        Allergies  Allergen  Reactions  . Gabapentin Swelling    "makes me feel detached"  . Naproxen Palpitations  . Neulasta [Pegfilgrastim] Itching and Other (See Comments)    Patient stated,"reddish skin tone, body on fire, tingly tongue."  . Erythromycin Nausea And Vomiting  . Gabapentin Other (See Comments)    "Makes me feel out of my body"  . Neulasta [Pegfilgrastim] Itching  . Tramadol Other (See Comments)    headache  . Erythromycin Nausea And Vomiting  . Naproxen Palpitations  . Tramadol Hcl Other (See Comments)    Headaches    Family History  Adopted: Yes  Problem Relation Age of Onset  . Breast cancer Brother 76       Maternal half-brother. Currently 15.  Marland Kitchen BRCA 1/2 Neg Hx     Social History   Socioeconomic History  . Marital status: Married    Spouse name: Not on file  . Number of children: 4  . Years of education: Not on file  . Highest education level: Not on file  Occupational History  . Not on file  Social Needs  . Financial resource strain: Not on file  . Food insecurity    Worry: Not on file    Inability: Not on file  . Transportation needs    Medical: Not on file    Non-medical: Not on file  Tobacco Use  . Smoking status: Never Smoker  . Smokeless tobacco: Never Used  Substance and Sexual Activity  . Alcohol use: Not Currently  . Drug use: Not Currently  . Sexual activity: Yes    Birth control/protection: Post-menopausal  Lifestyle  . Physical activity    Days per week: Not on file    Minutes per session: Not on file  . Stress: Not on file  Relationships  . Social Herbalist on phone: Not on file    Gets together: Not on file    Attends religious service: Not on file    Active member of club or organization: Not on file    Attends meetings of clubs or organizations: Not on file    Relationship status: Not on file  . Intimate partner violence    Fear of current or ex partner: Not on file    Emotionally abused: Not on file    Physically abused: Not  on file    Forced sexual activity: Not on file  Other Topics Concern  . Not on file  Social History Narrative   ** Merged History Encounter **         Constitutional: Pt reports fever. Denies malaise, fatigue, headache or abrupt weight changes.  HEENT: Pt reports loss of taste and smell. Denies eye pain, eye redness, ear pain, ringing in the ears, wax buildup, runny nose, nasal congestion, bloody nose, or  sore throat. Respiratory: Pt reports cough. Denies difficulty breathing, shortness of breath, or sputum production.   Cardiovascular: Denies chest pain, chest tightness, palpitations or swelling in the hands or feet.  Gastrointestinal: Pt reports nausea. Denies abdominal pain, bloating, constipation, diarrhea or blood in the stool.  Musculoskeletal: Pt reports body aches. Denies decrease in range of motion, difficulty with gait, or joint pain and swelling.  Skin: Denies redness, rashes, lesions or ulcercations.   No other specific complaints in a complete review of systems (except as listed in HPI above).    Observations/Objective:  LMP 11/07/2016   Wt Readings from Last 3 Encounters:  11/26/18 226 lb (102.5 kg)  11/07/18 227 lb (103 kg)  07/16/18 231 lb (104.8 kg)    General: Appears her stated age, obese, appears ill but in NAD. HEENT:; Nose: sounds congested; Throat/Mouth: Sounds hoarse.  Pulmonary/Chest: Normal effort. No respiratory distress. Neurological: Alert and oriented.   BMET    Component Value Date/Time   NA 135 11/01/2018 1657   NA 138 09/11/2017 0902   K 3.8 11/01/2018 1657   K 3.5 09/11/2017 0902   CL 102 11/01/2018 1657   CO2 24 11/01/2018 1657   CO2 25 09/11/2017 0902   GLUCOSE 109 (H) 11/01/2018 1657   GLUCOSE 98 09/11/2017 0902   BUN 19 11/01/2018 1657   BUN 18.2 09/11/2017 0902   CREATININE 0.92 11/01/2018 1657   CREATININE 1.0 09/11/2017 0902   CALCIUM 9.1 11/01/2018 1657   CALCIUM 9.4 09/11/2017 0902   GFRNONAA >60 11/01/2018 1657   GFRAA  >60 11/01/2018 1657    Lipid Panel     Component Value Date/Time   CHOL 174 11/07/2018 1432   TRIG 117.0 11/07/2018 1432   HDL 55.60 11/07/2018 1432   CHOLHDL 3 11/07/2018 1432   VLDL 23.4 11/07/2018 1432   LDLCALC 95 11/07/2018 1432    CBC    Component Value Date/Time   WBC 14.1 (H) 11/01/2018 1657   RBC 4.75 11/01/2018 1657   HGB 14.5 11/01/2018 1657   HGB 13.0 09/11/2017 0902   HCT 44.0 11/01/2018 1657   HCT 38.6 09/11/2017 0902   PLT 297 11/01/2018 1657   PLT 263 09/11/2017 0902   MCV 92.6 11/01/2018 1657   MCV 88.7 09/11/2017 0902   MCH 30.5 11/01/2018 1657   MCHC 33.0 11/01/2018 1657   RDW 13.1 11/01/2018 1657   RDW 13.4 09/11/2017 0902   LYMPHSABS 2.8 11/01/2018 1657   LYMPHSABS 1.4 09/11/2017 0902   MONOABS 1.3 (H) 11/01/2018 1657   MONOABS 0.8 09/11/2017 0902   EOSABS 0.1 11/01/2018 1657   EOSABS 0.2 09/11/2017 0902   BASOSABS 0.1 11/01/2018 1657   BASOSABS 0.0 09/11/2017 0902    Hgb A1C Lab Results  Component Value Date   HGBA1C 5.7 11/07/2018       Assessment and Plan:  COVID 19:  Encouraged symptomatic care: rest, fluids Continue Tylenol, Ibuprofen, Delsym and Zofran as needed Can take Vit C and Zinc OTC Discussed need for quarantine until 09/02/19 or until symptoms resolve Encouraged social distancing, masking and frequent handwashing FMLA forms will be completed   Return/ER precautions discussed  Follow Up Instructions:    I discussed the assessment and treatment plan with the patient. The patient was provided an opportunity to ask questions and all were answered. The patient agreed with the plan and demonstrated an understanding of the instructions.   The patient was advised to call back or seek an in-person evaluation if the symptoms worsen  or if the condition fails to improve as anticipated.     Webb Silversmith, NP

## 2019-08-25 NOTE — Telephone Encounter (Signed)
Done, given back to [name] 

## 2019-08-25 NOTE — Patient Instructions (Signed)
COVID-19 Frequently Asked Questions °COVID-19 (coronavirus disease) is an infection that is caused by a large family of viruses. Some viruses cause illness in people and others cause illness in animals like camels, cats, and bats. In some cases, the viruses that cause illness in animals can spread to humans. °Where did the coronavirus come from? °In December 2019, [place] told the World Health Organization (WHO) of several cases of lung disease (human respiratory illness). These cases were linked to an open seafood and livestock market in the city of [place]. The link to the seafood and livestock market suggests that the virus may have spread from animals to humans. However, since that first outbreak in December, the virus has also been shown to spread from person to person. °What is the name of the disease and the virus? °Disease name °Early on, this disease was called novel coronavirus. This is because scientists determined that the disease was caused by a new (novel) respiratory virus. The World Health Organization (WHO) has now named the disease COVID-19, or coronavirus disease. °Virus name °The virus that causes the disease is called severe acute respiratory syndrome coronavirus 2 (SARS-CoV-2). °More information on disease and virus naming °World Health Organization (WHO): www.who.int/emergencies/diseases/novel-coronavirus-2019/technical-guidance/naming-the-coronavirus-disease-(covid-2019)-and-the-virus-that-causes-it °Who is at risk for complications from coronavirus disease? °Some people may be at higher risk for complications from coronavirus disease. This includes older adults and people who have chronic diseases, such as heart disease, diabetes, and lung disease. °If you are at higher risk for complications, take these extra precautions: °· Avoid close contact with people who are sick or have a fever or cough. Stay at least 3-6 ft (1-2 m) away from them, if possible. °· Wash your hands often with soap and  water for at least 20 seconds. °· Avoid touching your face, mouth, nose, or eyes. °· Keep supplies on hand at home, such as food, medicine, and cleaning supplies. °· Stay home as much as possible. °· Avoid social gatherings and travel. °How does coronavirus disease spread? °The virus that causes coronavirus disease spreads easily from person to person (is contagious). There are also cases of community-spread disease. This means the disease has spread to: °· People who have no known contact with other infected people. °· People who have not traveled to areas where there are known cases. °It appears to spread from one person to another through droplets from coughing or sneezing. °Can I get the virus from touching surfaces or objects? °There is still a lot that we do not know about the virus that causes coronavirus disease. Scientists are basing a lot of information on what they know about similar viruses, such as: °· Viruses cannot generally survive on surfaces for long. They need a human body (host) to survive. °· It is more likely that the virus is spread by close contact with people who are sick (direct contact), such as through: °? Shaking hands or hugging. °? Breathing in respiratory droplets that travel through the air. This can happen when an infected person coughs or sneezes on or near other people. °· It is less likely that the virus is spread when a person touches a surface or object that has the virus on it (indirect contact). The virus may be able to enter the body if the person touches a surface or object and then touches his or her face, eyes, nose, or mouth. °Can a person spread the virus without having symptoms of the disease? °It may be possible for the virus to spread before a person   has symptoms of the disease, but this is most likely not the main way the virus is spreading. It is more likely for the virus to spread by being in close contact with people who are sick and breathing in the respiratory  droplets of a sick person's cough or sneeze. °What are the symptoms of coronavirus disease? °Symptoms vary from person to person and can range from mild to severe. Symptoms may include: °· Fever. °· Cough. °· Tiredness, weakness, or fatigue. °· Fast breathing or feeling short of breath. °These symptoms can appear anywhere from 2 to 14 days after you have been exposed to the virus. If you develop symptoms, call your health care provider. People with severe symptoms may need hospital care. °If I am exposed to the virus, how long does it take before symptoms start? °Symptoms of coronavirus disease may appear anywhere from 2 to 14 days after a person has been exposed to the virus. If you develop symptoms, call your health care provider. °Should I be tested for this virus? °Your health care provider will decide whether to test you based on your symptoms, history of exposure, and your risk factors. °How does a health care provider test for this virus? °Health care providers will collect samples to send for testing. Samples may include: °· Taking a swab of fluid from the nose. °· Taking fluid from the lungs by having you cough up mucus (sputum) into a sterile cup. °· Taking a blood sample. °· Taking a stool or urine sample. °Is there a treatment or vaccine for this virus? °Currently, there is no vaccine to prevent coronavirus disease. Also, there are no medicines like antibiotics or antivirals to treat the virus. A person who becomes sick is given supportive care, which means rest and fluids. A person may also relieve his or her symptoms by using over-the-counter medicines that treat sneezing, coughing, and runny nose. These are the same medicines that a person takes for the common cold. °If you develop symptoms, call your health care provider. People with severe symptoms may need hospital care. °What can I do to protect myself and my family from this virus? ° °  ° °You can protect yourself and your family by taking the  same actions that you would take to prevent the spread of other viruses. Take the following actions: °· Wash your hands often with soap and water for at least 20 seconds. If soap and water are not available, use alcohol-based hand sanitizer. °· Avoid touching your face, mouth, nose, or eyes. °· Cough or sneeze into a tissue, sleeve, or elbow. Do not cough or sneeze into your hand or the air. °? If you cough or sneeze into a tissue, throw it away immediately and wash your hands. °· Disinfect objects and surfaces that you frequently touch every day. °· Avoid close contact with people who are sick or have a fever or cough. Stay at least 3-6 ft (1-2 m) away from them, if possible. °· Stay home if you are sick, except to get medical care. Call your health care provider before you get medical care. °· Make sure your vaccines are up to date. Ask your health care provider what vaccines you need. °What should I do if I need to travel? °Follow travel recommendations from your local health authority, the CDC, and WHO. °Travel information and advice °· Centers for Disease Control and Prevention (CDC): www.cdc.gov/coronavirus/2019-ncov/travelers/index.html °· World Health Organization (WHO): www.who.int/emergencies/diseases/novel-coronavirus-2019/travel-advice °Know the risks and take action to protect your health °·   You are at higher risk of getting coronavirus disease if you are traveling to areas with an outbreak or if you are exposed to travelers from areas with an outbreak. °· Wash your hands often and practice good hygiene to lower the risk of catching or spreading the virus. °What should I do if I am sick? °General instructions to stop the spread of infection °· Wash your hands often with soap and water for at least 20 seconds. If soap and water are not available, use alcohol-based hand sanitizer. °· Cough or sneeze into a tissue, sleeve, or elbow. Do not cough or sneeze into your hand or the air. °· If you cough or  sneeze into a tissue, throw it away immediately and wash your hands. °· Stay home unless you must get medical care. Call your health care provider or local health authority before you get medical care. °· Avoid public areas. Do not take public transportation, if possible. °· If you can, wear a mask if you must go out of the house or if you are in close contact with someone who is not sick. °Keep your home clean °· Disinfect objects and surfaces that are frequently touched every day. This may include: °? Counters and tables. °? Doorknobs and light switches. °? Sinks and faucets. °? Electronics such as phones, remote controls, keyboards, computers, and tablets. °· Wash dishes in hot, soapy water or use a dishwasher. Air-dry your dishes. °· Wash laundry in hot water. °Prevent infecting other household members °· Let healthy household members care for children and pets, if possible. If you have to care for children or pets, wash your hands often and wear a mask. °· Sleep in a different bedroom or bed, if possible. °· Do not share personal items, such as razors, toothbrushes, deodorant, combs, brushes, towels, and washcloths. °Where to find more information °Centers for Disease Control and Prevention (CDC) °· Information and news updates: www.cdc.gov/coronavirus/2019-ncov °World Health Organization (WHO) °· Information and news updates: www.who.int/emergencies/diseases/novel-coronavirus-2019 °· Coronavirus health topic: www.who.int/health-topics/coronavirus °· Questions and answers on COVID-19: www.who.int/news-room/q-a-detail/q-a-coronaviruses °· Global tracker: who.sprinklr.com °American Academy of Pediatrics (AAP) °· Information for families: www.healthychildren.org/English/health-issues/conditions/chest-lungs/Pages/2019-Novel-Coronavirus.aspx °The coronavirus situation is changing rapidly. Check your local health authority website or the CDC and WHO websites for updates and news. °When should I contact a health care  provider? °· Contact your health care provider if you have symptoms of an infection, such as fever or cough, and you: °? Have been near anyone who is known to have coronavirus disease. °? Have come into contact with a person who is suspected to have coronavirus disease. °? Have traveled outside of the country. °When should I get emergency medical care? °· Get help right away by calling your local emergency services (911 in the [place] if you have: °? Trouble breathing. °? Pain or pressure in your chest. °? Confusion. °? Blue-tinged lips and fingernails. °? Difficulty waking from sleep. °? Symptoms that get worse. °Let the emergency medical personnel know if you think you have coronavirus disease. °Summary °· A new respiratory virus is spreading from person to person and causing COVID-19 (coronavirus disease). °· The virus that causes COVID-19 appears to spread easily. It spreads from one person to another through droplets from coughing or sneezing. °· Older adults and those with chronic diseases are at higher risk of disease. If you are at higher risk for complications, take extra precautions. °· There is currently no vaccine to prevent coronavirus disease. There are no medicines, such as antibiotics or   antivirals, to treat the virus. °· You can protect yourself and your family by washing your hands often, avoiding touching your face, and covering your coughs and sneezes. °This information is not intended to replace advice given to you by your health care provider. Make sure you discuss any questions you have with your health care provider. °Document Released: 12/30/2018 Document Revised: 12/30/2018 Document Reviewed: 12/30/2018 °Elsevier Patient Education © 2020 [employer] ° °

## 2019-08-31 ENCOUNTER — Encounter (INDEPENDENT_AMBULATORY_CARE_PROVIDER_SITE_OTHER): Payer: Self-pay

## 2019-08-31 NOTE — Telephone Encounter (Signed)
Copy for pt Copy for scan Sent pt my chart message

## 2019-09-01 ENCOUNTER — Ambulatory Visit (INDEPENDENT_AMBULATORY_CARE_PROVIDER_SITE_OTHER): Payer: BC Managed Care – PPO | Admitting: Internal Medicine

## 2019-09-01 ENCOUNTER — Encounter: Payer: Self-pay | Admitting: Internal Medicine

## 2019-09-01 DIAGNOSIS — U071 COVID-19: Secondary | ICD-10-CM

## 2019-09-01 MED ORDER — HYDROCODONE-HOMATROPINE 5-1.5 MG/5ML PO SYRP: 5.0000 mL | ORAL_SOLUTION | Freq: Three times a day (TID) | ORAL | 0 refills | Status: DC | PRN

## 2019-09-01 MED ORDER — FLUTICASONE PROPIONATE HFA 44 MCG/ACT IN AERO: 2.0000 | INHALATION_SPRAY | Freq: Two times a day (BID) | RESPIRATORY_TRACT | 0 refills | Status: DC

## 2019-09-01 NOTE — Patient Instructions (Signed)
COVID-19 Frequently Asked Questions °COVID-19 (coronavirus disease) is an infection that is caused by a large family of viruses. Some viruses cause illness in people and others cause illness in animals like camels, cats, and bats. In some cases, the viruses that cause illness in animals can spread to humans. °Where did the coronavirus come from? °In December 2019, [place] told the World Health Organization (WHO) of several cases of lung disease (human respiratory illness). These cases were linked to an open seafood and livestock market in the city of [place]. The link to the seafood and livestock market suggests that the virus may have spread from animals to humans. However, since that first outbreak in December, the virus has also been shown to spread from person to person. °What is the name of the disease and the virus? °Disease name °Early on, this disease was called novel coronavirus. This is because scientists determined that the disease was caused by a new (novel) respiratory virus. The World Health Organization (WHO) has now named the disease COVID-19, or coronavirus disease. °Virus name °The virus that causes the disease is called severe acute respiratory syndrome coronavirus 2 (SARS-CoV-2). °More information on disease and virus naming °World Health Organization (WHO): www.who.int/emergencies/diseases/novel-coronavirus-2019/technical-guidance/naming-the-coronavirus-disease-(covid-2019)-and-the-virus-that-causes-it °Who is at risk for complications from coronavirus disease? °Some people may be at higher risk for complications from coronavirus disease. This includes older adults and people who have chronic diseases, such as heart disease, diabetes, and lung disease. °If you are at higher risk for complications, take these extra precautions: °· Avoid close contact with people who are sick or have a fever or cough. Stay at least 3-6 ft (1-2 m) away from them, if possible. °· Wash your hands often with soap and  water for at least 20 seconds. °· Avoid touching your face, mouth, nose, or eyes. °· Keep supplies on hand at home, such as food, medicine, and cleaning supplies. °· Stay home as much as possible. °· Avoid social gatherings and travel. °How does coronavirus disease spread? °The virus that causes coronavirus disease spreads easily from person to person (is contagious). There are also cases of community-spread disease. This means the disease has spread to: °· People who have no known contact with other infected people. °· People who have not traveled to areas where there are known cases. °It appears to spread from one person to another through droplets from coughing or sneezing. °Can I get the virus from touching surfaces or objects? °There is still a lot that we do not know about the virus that causes coronavirus disease. Scientists are basing a lot of information on what they know about similar viruses, such as: °· Viruses cannot generally survive on surfaces for long. They need a human body (host) to survive. °· It is more likely that the virus is spread by close contact with people who are sick (direct contact), such as through: °? Shaking hands or hugging. °? Breathing in respiratory droplets that travel through the air. This can happen when an infected person coughs or sneezes on or near other people. °· It is less likely that the virus is spread when a person touches a surface or object that has the virus on it (indirect contact). The virus may be able to enter the body if the person touches a surface or object and then touches his or her face, eyes, nose, or mouth. °Can a person spread the virus without having symptoms of the disease? °It may be possible for the virus to spread before a person   has symptoms of the disease, but this is most likely not the main way the virus is spreading. It is more likely for the virus to spread by being in close contact with people who are sick and breathing in the respiratory  droplets of a sick person's cough or sneeze. °What are the symptoms of coronavirus disease? °Symptoms vary from person to person and can range from mild to severe. Symptoms may include: °· Fever. °· Cough. °· Tiredness, weakness, or fatigue. °· Fast breathing or feeling short of breath. °These symptoms can appear anywhere from 2 to 14 days after you have been exposed to the virus. If you develop symptoms, call your health care provider. People with severe symptoms may need hospital care. °If I am exposed to the virus, how long does it take before symptoms start? °Symptoms of coronavirus disease may appear anywhere from 2 to 14 days after a person has been exposed to the virus. If you develop symptoms, call your health care provider. °Should I be tested for this virus? °Your health care provider will decide whether to test you based on your symptoms, history of exposure, and your risk factors. °How does a health care provider test for this virus? °Health care providers will collect samples to send for testing. Samples may include: °· Taking a swab of fluid from the nose. °· Taking fluid from the lungs by having you cough up mucus (sputum) into a sterile cup. °· Taking a blood sample. °· Taking a stool or urine sample. °Is there a treatment or vaccine for this virus? °Currently, there is no vaccine to prevent coronavirus disease. Also, there are no medicines like antibiotics or antivirals to treat the virus. A person who becomes sick is given supportive care, which means rest and fluids. A person may also relieve his or her symptoms by using over-the-counter medicines that treat sneezing, coughing, and runny nose. These are the same medicines that a person takes for the common cold. °If you develop symptoms, call your health care provider. People with severe symptoms may need hospital care. °What can I do to protect myself and my family from this virus? ° °  ° °You can protect yourself and your family by taking the  same actions that you would take to prevent the spread of other viruses. Take the following actions: °· Wash your hands often with soap and water for at least 20 seconds. If soap and water are not available, use alcohol-based hand sanitizer. °· Avoid touching your face, mouth, nose, or eyes. °· Cough or sneeze into a tissue, sleeve, or elbow. Do not cough or sneeze into your hand or the air. °? If you cough or sneeze into a tissue, throw it away immediately and wash your hands. °· Disinfect objects and surfaces that you frequently touch every day. °· Avoid close contact with people who are sick or have a fever or cough. Stay at least 3-6 ft (1-2 m) away from them, if possible. °· Stay home if you are sick, except to get medical care. Call your health care provider before you get medical care. °· Make sure your vaccines are up to date. Ask your health care provider what vaccines you need. °What should I do if I need to travel? °Follow travel recommendations from your local health authority, the CDC, and WHO. °Travel information and advice °· Centers for Disease Control and Prevention (CDC): www.cdc.gov/coronavirus/2019-ncov/travelers/index.html °· World Health Organization (WHO): www.who.int/emergencies/diseases/novel-coronavirus-2019/travel-advice °Know the risks and take action to protect your health °·   You are at higher risk of getting coronavirus disease if you are traveling to areas with an outbreak or if you are exposed to travelers from areas with an outbreak. °· Wash your hands often and practice good hygiene to lower the risk of catching or spreading the virus. °What should I do if I am sick? °General instructions to stop the spread of infection °· Wash your hands often with soap and water for at least 20 seconds. If soap and water are not available, use alcohol-based hand sanitizer. °· Cough or sneeze into a tissue, sleeve, or elbow. Do not cough or sneeze into your hand or the air. °· If you cough or  sneeze into a tissue, throw it away immediately and wash your hands. °· Stay home unless you must get medical care. Call your health care provider or local health authority before you get medical care. °· Avoid public areas. Do not take public transportation, if possible. °· If you can, wear a mask if you must go out of the house or if you are in close contact with someone who is not sick. °Keep your home clean °· Disinfect objects and surfaces that are frequently touched every day. This may include: °? Counters and tables. °? Doorknobs and light switches. °? Sinks and faucets. °? Electronics such as phones, remote controls, keyboards, computers, and tablets. °· Wash dishes in hot, soapy water or use a dishwasher. Air-dry your dishes. °· Wash laundry in hot water. °Prevent infecting other household members °· Let healthy household members care for children and pets, if possible. If you have to care for children or pets, wash your hands often and wear a mask. °· Sleep in a different bedroom or bed, if possible. °· Do not share personal items, such as razors, toothbrushes, deodorant, combs, brushes, towels, and washcloths. °Where to find more information °Centers for Disease Control and Prevention (CDC) °· Information and news updates: www.cdc.gov/coronavirus/2019-ncov °World Health Organization (WHO) °· Information and news updates: www.who.int/emergencies/diseases/novel-coronavirus-2019 °· Coronavirus health topic: www.who.int/health-topics/coronavirus °· Questions and answers on COVID-19: www.who.int/news-room/q-a-detail/q-a-coronaviruses °· Global tracker: who.sprinklr.com °American Academy of Pediatrics (AAP) °· Information for families: www.healthychildren.org/English/health-issues/conditions/chest-lungs/Pages/2019-Novel-Coronavirus.aspx °The coronavirus situation is changing rapidly. Check your local health authority website or the CDC and WHO websites for updates and news. °When should I contact a health care  provider? °· Contact your health care provider if you have symptoms of an infection, such as fever or cough, and you: °? Have been near anyone who is known to have coronavirus disease. °? Have come into contact with a person who is suspected to have coronavirus disease. °? Have traveled outside of the country. °When should I get emergency medical care? °· Get help right away by calling your local emergency services (911 in the [place] if you have: °? Trouble breathing. °? Pain or pressure in your chest. °? Confusion. °? Blue-tinged lips and fingernails. °? Difficulty waking from sleep. °? Symptoms that get worse. °Let the emergency medical personnel know if you think you have coronavirus disease. °Summary °· A new respiratory virus is spreading from person to person and causing COVID-19 (coronavirus disease). °· The virus that causes COVID-19 appears to spread easily. It spreads from one person to another through droplets from coughing or sneezing. °· Older adults and those with chronic diseases are at higher risk of disease. If you are at higher risk for complications, take extra precautions. °· There is currently no vaccine to prevent coronavirus disease. There are no medicines, such as antibiotics or   antivirals, to treat the virus. °· You can protect yourself and your family by washing your hands often, avoiding touching your face, and covering your coughs and sneezes. °This information is not intended to replace advice given to you by your health care provider. Make sure you discuss any questions you have with your health care provider. °Document Released: 12/30/2018 Document Revised: 12/30/2018 Document Reviewed: 12/30/2018 °Elsevier Patient Education © 2020 [employer] ° °

## 2019-09-01 NOTE — Progress Notes (Signed)
Virtual Visit via Video Note  I connected with Stacy [name] on 09/01/19 at 11:15 AM EST by a video enabled telemedicine application and verified that I am speaking with the correct person using two identifiers.  Location: Patient: Home Provider: Office   I discussed the limitations of evaluation and management by telemedicine and the availability of in person appointments. The patient expressed understanding and agreed to proceed.  History of Present Illness:  Pt wants to follow up on COVID 19 symptoms. Diagnosed on 08/19/19. She reports persistent fatigue, fever, body aches, runny nose, loss of taste/smell, cough, nausea and diarrhea. She has not run a fever in 3-4 days. The cough is dry and non productive. She has not vomited. She reports her stools are loose without blood. She denies headache, nasal congestion, ear pain, or shortness of breath. She has tried Ibuprofen and Tessalon without any relief.   Past Medical History:  Diagnosis Date  . Anxiety   . Arthritis   . Cancer ([place])   . Complication of anesthesia    DIFFICULTY WAKING UP , LAST SURGERY NECK 2012 WAS FINE PER PT HERE AT [facility]  . Depression   . Fibromyalgia   . Genetic testing 12/21/2016   Genetic testing reported out on 12/20/2016 through [facility]'s 46-gene Common Hereditary Cancers panel found no deleterious mutations. The following 46 genes were analyzed: APC, ATM, AXIN2, BARD1, BMPR1A, BRCA1, BRCA2, BRIP1, CDH1, CDKN2A, CHEK2, CTNNA1, DICER1, EPCAM, GREM1, KIT, MEN1, MLH1, MSH2, MSH3, MSH6, MUTYH, NBN, NF1, NTHL1, PALB2, PDGFRA, PMS2, POLD1, POLE, PTEN, RAD50, RAD51C, RAD51D, SDH  . Headache    HX MIGRAINES    . Heart murmur   . History of radiation therapy 02/14/17-04/03/2017   left breast 60.4 Gy: 50.4 Gy delivered in 28 fractions and a boost of 10 Gy delivered in 5 fractions.  . Hypertension   . Malignant neoplasm of upper-outer quadrant of left female breast ([place]) 08/23/2016  . Mitral valve disorder   . MVP  (mitral valve prolapse)    AS INFANT  . Neck pain   . Personal history of chemotherapy   . Personal history of radiation therapy   . Vision abnormalities     Current Outpatient Medications  Medication Sig Dispense Refill  . alum & mag hydroxide-simeth (MAALOX/MYLANTA) 200-200-20 MG/5ML suspension Take 15 mLs by mouth every 6 (six) hours as needed for indigestion or heartburn.    Marland Kitchen amLODipine (NORVASC) 5 MG tablet Take 1 tablet (5 mg total) by mouth daily. 30 tablet 2  . benzonatate (TESSALON PERLES) 100 MG capsule Take 1 capsule (100 mg total) by mouth 3 (three) times daily as needed. 20 capsule 0  . hydrochlorothiazide (HYDRODIURIL) 25 MG tablet TAKE 1 TABLET BY MOUTH EVERY DAY 90 tablet 1  . ibuprofen (ADVIL,MOTRIN) 200 MG tablet Take 400 mg by mouth every 6 (six) hours as needed for mild pain.    Marland Kitchen ondansetron (ZOFRAN) 4 MG tablet Take 1 tablet (4 mg total) by mouth every 6 (six) hours as needed for nausea or vomiting. 12 tablet 0  . pantoprazole (PROTONIX) 40 MG tablet Take 1 tablet (40 mg total) by mouth daily. 28 tablet 0   No current facility-administered medications for this visit.    Allergies  Allergen Reactions  . Gabapentin Swelling    "makes me feel detached"  . Naproxen Palpitations  . Neulasta [Pegfilgrastim] Itching and Other (See Comments)    Patient stated,"reddish skin tone, body on fire, tingly tongue."  . Erythromycin Nausea And Vomiting  .  Gabapentin Other (See Comments)    "Makes me feel out of my body"  . Neulasta [Pegfilgrastim] Itching  . Tramadol Other (See Comments)    headache  . Erythromycin Nausea And Vomiting  . Naproxen Palpitations  . Tramadol Hcl Other (See Comments)    Headaches    Family History  Adopted: Yes  Problem Relation Age of Onset  . Breast cancer Brother 63       Maternal half-brother. Currently 1.  Marland Kitchen BRCA 1/2 Neg Hx     Social History   Socioeconomic History  . Marital status: Married    Spouse name: Not on file  .  Number of children: 4  . Years of education: Not on file  . Highest education level: Not on file  Occupational History  . Not on file  Tobacco Use  . Smoking status: Never Smoker  . Smokeless tobacco: Never Used  Substance and Sexual Activity  . Alcohol use: Not Currently  . Drug use: Not Currently  . Sexual activity: Yes    Birth control/protection: Post-menopausal  Other Topics Concern  . Not on file  Social History Narrative   ** Merged History Encounter **       Social Determinants of Health   Financial Resource Strain:   . Difficulty of Paying Living Expenses: Not on file  Food Insecurity:   . Worried About Charity fundraiser in the Last Year: Not on file  . Ran Out of Food in the Last Year: Not on file  Transportation Needs:   . Lack of Transportation (Medical): Not on file  . Lack of Transportation (Non-Medical): Not on file  Physical Activity:   . Days of Exercise per Week: Not on file  . Minutes of Exercise per Session: Not on file  Stress:   . Feeling of Stress : Not on file  Social Connections:   . Frequency of Communication with Friends and Family: Not on file  . Frequency of Social Gatherings with Friends and Family: Not on file  . Attends Religious Services: Not on file  . Active Member of Clubs or Organizations: Not on file  . Attends Archivist Meetings: Not on file  . Marital Status: Not on file  Intimate Partner Violence:   . Fear of Current or Ex-Partner: Not on file  . Emotionally Abused: Not on file  . Physically Abused: Not on file  . Sexually Abused: Not on file     Constitutional: Pt reports fatigue, fever. Denies headache or abrupt weight changes.  HEENT: Pt reports runny nose, loss of taste/smell. Denies eye pain, eye redness, ear pain, ringing in the ears, wax buildup, runny nose, nasal congestion, bloody nose, or sore throat. Respiratory: Pt report cough. Denies difficulty breathing, shortness of breath, or sputum production.    Cardiovascular: Denies chest pain, chest tightness, palpitations or swelling in the hands or feet.  Gastrointestinal: Pt reports nausea and diarrhea. Denies abdominal pain, bloating, constipation, or blood in the stool.  Musculoskeletal: Pt reports body aches. Denies decrease in range of motion, difficulty with gait, or joint pain and swelling.  Skin: Denies redness, rashes, lesions or ulcercations.   No other specific complaints in a complete review of systems (except as listed in HPI above).    Observations/Objective: LMP 11/07/2016  Wt Readings from Last 3 Encounters:  11/26/18 226 lb (102.5 kg)  11/07/18 227 lb (103 kg)  07/16/18 231 lb (104.8 kg)    General: Appears her stated age, obese, ill  appearing but in NAD. Skin: Warm, dry and intact. No rashes noted. HEENT: Head: normal shape and size; Nose: does not sound congested; Throat/Mouth: no hoarseness noted Pulmonary/Chest: Normal effort. Intermittent dry cough noted. No respiratory distress.  Neurological: Alert and oriented.    BMET    Component Value Date/Time   NA 135 11/01/2018 1657   NA 138 09/11/2017 0902   K 3.8 11/01/2018 1657   K 3.5 09/11/2017 0902   CL 102 11/01/2018 1657   CO2 24 11/01/2018 1657   CO2 25 09/11/2017 0902   GLUCOSE 109 (H) 11/01/2018 1657   GLUCOSE 98 09/11/2017 0902   BUN 19 11/01/2018 1657   BUN 18.2 09/11/2017 0902   CREATININE 0.92 11/01/2018 1657   CREATININE 1.0 09/11/2017 0902   CALCIUM 9.1 11/01/2018 1657   CALCIUM 9.4 09/11/2017 0902   GFRNONAA >60 11/01/2018 1657   GFRAA >60 11/01/2018 1657    Lipid Panel     Component Value Date/Time   CHOL 174 11/07/2018 1432   TRIG 117.0 11/07/2018 1432   HDL 55.60 11/07/2018 1432   CHOLHDL 3 11/07/2018 1432   VLDL 23.4 11/07/2018 1432   LDLCALC 95 11/07/2018 1432    CBC    Component Value Date/Time   WBC 14.1 (H) 11/01/2018 1657   RBC 4.75 11/01/2018 1657   HGB 14.5 11/01/2018 1657   HGB 13.0 09/11/2017 0902   HCT 44.0  11/01/2018 1657   HCT 38.6 09/11/2017 0902   PLT 297 11/01/2018 1657   PLT 263 09/11/2017 0902   MCV 92.6 11/01/2018 1657   MCV 88.7 09/11/2017 0902   MCH 30.5 11/01/2018 1657   MCHC 33.0 11/01/2018 1657   RDW 13.1 11/01/2018 1657   RDW 13.4 09/11/2017 0902   LYMPHSABS 2.8 11/01/2018 1657   LYMPHSABS 1.4 09/11/2017 0902   MONOABS 1.3 (H) 11/01/2018 1657   MONOABS 0.8 09/11/2017 0902   EOSABS 0.1 11/01/2018 1657   EOSABS 0.2 09/11/2017 0902   BASOSABS 0.1 11/01/2018 1657   BASOSABS 0.0 09/11/2017 0902    Hgb A1C Lab Results  Component Value Date   HGBA1C 5.7 11/07/2018        Assessment and Plan:  COVID 19:  Continue symptomatic care: rest and fluids Continue Ibuprofen as needed She has Zofran to take as needed for nausea RX for Flovent inhaler 44 mcg 2 puffs BID until cough resolves RX for Hycodan for cough Work note provided Discussed quarantine for 14 days Encouraged social distancing, masking and frequent handwashing even in the home  ER/Return precautions discussed  Follow Up Instructions:    I discussed the assessment and treatment plan with the patient. The patient was provided an opportunity to ask questions and all were answered. The patient agreed with the plan and demonstrated an understanding of the instructions.   The patient was advised to call back or seek an in-person evaluation if the symptoms worsen or if the condition fails to improve as anticipated.    Webb Silversmith, NP

## 2019-09-04 ENCOUNTER — Encounter: Payer: Self-pay | Admitting: Internal Medicine

## 2019-09-06 ENCOUNTER — Other Ambulatory Visit: Payer: Self-pay | Admitting: Internal Medicine

## 2019-09-28 ENCOUNTER — Encounter: Payer: Self-pay | Admitting: Internal Medicine

## 2019-11-12 ENCOUNTER — Encounter: Payer: Self-pay | Admitting: Internal Medicine

## 2019-11-13 ENCOUNTER — Encounter: Payer: Self-pay | Admitting: Internal Medicine

## 2019-11-13 ENCOUNTER — Other Ambulatory Visit: Payer: Self-pay

## 2019-11-13 ENCOUNTER — Ambulatory Visit (INDEPENDENT_AMBULATORY_CARE_PROVIDER_SITE_OTHER): Payer: BC Managed Care – PPO | Admitting: Internal Medicine

## 2019-11-13 VITALS — BP 128/82 | HR 84 | Temp 98.2°F | Ht 62.5 in | Wt 225.0 lb

## 2019-11-13 DIAGNOSIS — F419 Anxiety disorder, unspecified: Secondary | ICD-10-CM | POA: Diagnosis not present

## 2019-11-13 DIAGNOSIS — I10 Essential (primary) hypertension: Secondary | ICD-10-CM | POA: Diagnosis not present

## 2019-11-13 DIAGNOSIS — F32A Depression, unspecified: Secondary | ICD-10-CM

## 2019-11-13 DIAGNOSIS — M797 Fibromyalgia: Secondary | ICD-10-CM | POA: Diagnosis not present

## 2019-11-13 DIAGNOSIS — F329 Major depressive disorder, single episode, unspecified: Secondary | ICD-10-CM

## 2019-11-13 DIAGNOSIS — C50412 Malignant neoplasm of upper-outer quadrant of left female breast: Secondary | ICD-10-CM

## 2019-11-13 DIAGNOSIS — E559 Vitamin D deficiency, unspecified: Secondary | ICD-10-CM | POA: Diagnosis not present

## 2019-11-13 DIAGNOSIS — M1990 Unspecified osteoarthritis, unspecified site: Secondary | ICD-10-CM

## 2019-11-13 DIAGNOSIS — Z0000 Encounter for general adult medical examination without abnormal findings: Secondary | ICD-10-CM | POA: Diagnosis not present

## 2019-11-13 NOTE — Assessment & Plan Note (Signed)
Continue Ibuprofen and exercise

## 2019-11-13 NOTE — Assessment & Plan Note (Signed)
Stable off meds °Will monitor °

## 2019-11-13 NOTE — Patient Instructions (Signed)

## 2019-11-13 NOTE — Assessment & Plan Note (Signed)
Following yearly mammograms

## 2019-11-13 NOTE — Progress Notes (Signed)
Subjective:    Patient ID: Stacy [name], female    DOB: 28-Aug-1961, 59 y.o.   MRN: [id number]  HPI  Patient presents to the clinic today for her annual exam.  She is also due to follow-up chronic conditions.  Anxiety and Depression: Stable off meds.  She does not follow with a therapist.  She denies SI/HI.  OA/Fibromyalgia: Managed with Ibuprofen and exercise.  HTN: Her BP today is 128/82.  She is taking HCTZ as prescribed.  ECG from 01/2017 reviewed.  History of Left Breast Cancer: Status post excision, chemo and radiation.  She gets yearly mammograms.  She no longer follows with oncology.  GERD: Triggered by fried, fatty and greasy foods.  She denies breakthrough on Pantoprazole.  There is no upper GI on file.  Flu: never Tetanus: 10/2010 Pap smear: 04/2016 Mammogram: 07/2019 Colon screening: 09/2017, Cologuard Vision screening: as needed Dentist: as needed  Diet: She eats a little meat. She consumes fruits and veggies daily. She tries to avoid fried foods. She drinks mostly water, coffee, occasional sweet tea. Exercise: None  Review of Systems  Past Medical History:  Diagnosis Date  . Anxiety   . Arthritis   . Cancer ([place])   . Complication of anesthesia    DIFFICULTY WAKING UP , LAST SURGERY NECK 2012 WAS FINE PER PT HERE AT [facility]  . Depression   . Fibromyalgia   . Genetic testing 12/21/2016   Genetic testing reported out on 12/20/2016 through [facility]'s 46-gene Common Hereditary Cancers panel found no deleterious mutations. The following 46 genes were analyzed: APC, ATM, AXIN2, BARD1, BMPR1A, BRCA1, BRCA2, BRIP1, CDH1, CDKN2A, CHEK2, CTNNA1, DICER1, EPCAM, GREM1, KIT, MEN1, MLH1, MSH2, MSH3, MSH6, MUTYH, NBN, NF1, NTHL1, PALB2, PDGFRA, PMS2, POLD1, POLE, PTEN, RAD50, RAD51C, RAD51D, SDH  . Headache    HX MIGRAINES    . Heart murmur   . History of radiation therapy 02/14/17-04/03/2017   left breast 60.4 Gy: 50.4 Gy delivered in 28 fractions and a boost of 10 Gy  delivered in 5 fractions.  . Hypertension   . Malignant neoplasm of upper-outer quadrant of left female breast ([place]) 08/23/2016  . Mitral valve disorder   . MVP (mitral valve prolapse)    AS INFANT  . Neck pain   . Personal history of chemotherapy   . Personal history of radiation therapy   . Vision abnormalities     Current Outpatient Medications  Medication Sig Dispense Refill  . alum & mag hydroxide-simeth (MAALOX/MYLANTA) 200-200-20 MG/5ML suspension Take 15 mLs by mouth every 6 (six) hours as needed for indigestion or heartburn.    Marland Kitchen amLODipine (NORVASC) 5 MG tablet Take 1 tablet (5 mg total) by mouth daily. 30 tablet 2  . benzonatate (TESSALON PERLES) 100 MG capsule Take 1 capsule (100 mg total) by mouth 3 (three) times daily as needed. 20 capsule 0  . fluticasone (FLOVENT HFA) 44 MCG/ACT inhaler Inhale 2 puffs into the lungs 2 (two) times daily. 1 Inhaler 0  . hydrochlorothiazide (HYDRODIURIL) 25 MG tablet Take 1 tablet (25 mg total) by mouth daily. SCHEDULE PHYSICAL March 2021 90 tablet 0  . HYDROcodone-homatropine (HYCODAN) 5-1.5 MG/5ML syrup Take 5 mLs by mouth every 8 (eight) hours as needed for cough. 120 mL 0  . ibuprofen (ADVIL,MOTRIN) 200 MG tablet Take 400 mg by mouth every 6 (six) hours as needed for mild pain.    Marland Kitchen ondansetron (ZOFRAN) 4 MG tablet Take 1 tablet (4 mg total) by mouth every 6 (six)  hours as needed for nausea or vomiting. 12 tablet 0  . pantoprazole (PROTONIX) 40 MG tablet Take 1 tablet (40 mg total) by mouth daily. 28 tablet 0   No current facility-administered medications for this visit.    Allergies  Allergen Reactions  . Gabapentin Swelling    "makes me feel detached"  . Naproxen Palpitations  . Neulasta [Pegfilgrastim] Itching and Other (See Comments)    Patient stated,"reddish skin tone, body on fire, tingly tongue."  . Erythromycin Nausea And Vomiting  . Gabapentin Other (See Comments)    "Makes me feel out of my body"  . Neulasta  [Pegfilgrastim] Itching  . Tramadol Other (See Comments)    headache  . Erythromycin Nausea And Vomiting  . Naproxen Palpitations  . Tramadol Hcl Other (See Comments)    Headaches    Family History  Adopted: Yes  Problem Relation Age of Onset  . Breast cancer Brother 67       Maternal half-brother. Currently 28.  Marland Kitchen BRCA 1/2 Neg Hx     Social History   Socioeconomic History  . Marital status: Married    Spouse name: Not on file  . Number of children: 4  . Years of education: Not on file  . Highest education level: Not on file  Occupational History  . Not on file  Tobacco Use  . Smoking status: Never Smoker  . Smokeless tobacco: Never Used  Substance and Sexual Activity  . Alcohol use: Not Currently  . Drug use: Not Currently  . Sexual activity: Yes    Birth control/protection: Post-menopausal  Other Topics Concern  . Not on file  Social History Narrative   ** Merged History Encounter **       Social Determinants of Health   Financial Resource Strain:   . Difficulty of Paying Living Expenses: Not on file  Food Insecurity:   . Worried About Charity fundraiser in the Last Year: Not on file  . Ran Out of Food in the Last Year: Not on file  Transportation Needs:   . Lack of Transportation (Medical): Not on file  . Lack of Transportation (Non-Medical): Not on file  Physical Activity:   . Days of Exercise per Week: Not on file  . Minutes of Exercise per Session: Not on file  Stress:   . Feeling of Stress : Not on file  Social Connections:   . Frequency of Communication with Friends and Family: Not on file  . Frequency of Social Gatherings with Friends and Family: Not on file  . Attends Religious Services: Not on file  . Active Member of Clubs or Organizations: Not on file  . Attends Archivist Meetings: Not on file  . Marital Status: Not on file  Intimate Partner Violence:   . Fear of Current or Ex-Partner: Not on file  . Emotionally Abused: Not on  file  . Physically Abused: Not on file  . Sexually Abused: Not on file     Constitutional: Denies fever, malaise, fatigue, headache or abrupt weight changes.  HEENT: Denies eye pain, eye redness, ear pain, ringing in the ears, wax buildup, runny nose, nasal congestion, bloody nose, or sore throat. Respiratory: Denies difficulty breathing, shortness of breath, cough or sputum production.   Cardiovascular: Denies chest pain, chest tightness, palpitations or swelling in the hands or feet.  Gastrointestinal: Denies abdominal pain, bloating, constipation, diarrhea or blood in the stool.  GU: Pt reports urinary incontinence. Denies urgency, frequency, pain with urination,  burning sensation, blood in urine, odor or discharge. Musculoskeletal: Pt reports chronic joint and muscle pain. Denies decrease in range of motion, difficulty with gait, or joint swelling.  Skin: Denies redness, rashes, lesions or ulcercations.  Neurological: Denies dizziness, difficulty with memory, difficulty with speech or problems with balance and coordination.  Psych: Pt has a history of anxiety and depression. Denies SI/HI.  No other specific complaints in a complete review of systems (except as listed in HPI above).     Objective:   Physical Exam  BP 128/82   Pulse 84   Temp 98.2 F (36.8 C) (Temporal)   Ht 5' 2.5" (1.588 m)   Wt 225 lb (102.1 kg)   LMP 11/07/2016   SpO2 98%   BMI 40.50 kg/m   Wt Readings from Last 3 Encounters:  11/26/18 226 lb (102.5 kg)  11/07/18 227 lb (103 kg)  07/16/18 231 lb (104.8 kg)    General: Appears her stated age, obese, in NAD. Skin: Warm, dry and intact. No rashesnoted. HEENT: Head: normal shape and size; Eyes: sclera white, no icterus, conjunctiva pink, PERRLA and EOMs intact;  Neck:  Neck supple, trachea midline. No masses, lumps or thyromegaly present.  Cardiovascular: Normal rate and rhythm. S1,S2 noted.  Murmur noted. No JVD or BLE edema. No carotid bruits  noted. Pulmonary/Chest: Normal effort and positive vesicular breath sounds. No respiratory distress. No wheezes, rales or ronchi noted.  Abdomen: Soft and nontender. Normal bowel sounds. No distention or masses noted. Liver, spleen and kidneys non palpable. Musculoskeletal: Strength 5/5 BUE/BLE. No difficulty with gait.  Neurological: Alert and oriented. Cranial nerves II-XII grossly intact. Coordination normal.  Psychiatric: Mood and affect normal. Behavior is normal. Judgment and thought content normal.    BMET    Component Value Date/Time   NA 135 11/01/2018 1657   NA 138 09/11/2017 0902   K 3.8 11/01/2018 1657   K 3.5 09/11/2017 0902   CL 102 11/01/2018 1657   CO2 24 11/01/2018 1657   CO2 25 09/11/2017 0902   GLUCOSE 109 (H) 11/01/2018 1657   GLUCOSE 98 09/11/2017 0902   BUN 19 11/01/2018 1657   BUN 18.2 09/11/2017 0902   CREATININE 0.92 11/01/2018 1657   CREATININE 1.0 09/11/2017 0902   CALCIUM 9.1 11/01/2018 1657   CALCIUM 9.4 09/11/2017 0902   GFRNONAA >60 11/01/2018 1657   GFRAA >60 11/01/2018 1657    Lipid Panel     Component Value Date/Time   CHOL 174 11/07/2018 1432   TRIG 117.0 11/07/2018 1432   HDL 55.60 11/07/2018 1432   CHOLHDL 3 11/07/2018 1432   VLDL 23.4 11/07/2018 1432   LDLCALC 95 11/07/2018 1432    CBC    Component Value Date/Time   WBC 14.1 (H) 11/01/2018 1657   RBC 4.75 11/01/2018 1657   HGB 14.5 11/01/2018 1657   HGB 13.0 09/11/2017 0902   HCT 44.0 11/01/2018 1657   HCT 38.6 09/11/2017 0902   PLT 297 11/01/2018 1657   PLT 263 09/11/2017 0902   MCV 92.6 11/01/2018 1657   MCV 88.7 09/11/2017 0902   MCH 30.5 11/01/2018 1657   MCHC 33.0 11/01/2018 1657   RDW 13.1 11/01/2018 1657   RDW 13.4 09/11/2017 0902   LYMPHSABS 2.8 11/01/2018 1657   LYMPHSABS 1.4 09/11/2017 0902   MONOABS 1.3 (H) 11/01/2018 1657   MONOABS 0.8 09/11/2017 0902   EOSABS 0.1 11/01/2018 1657   EOSABS 0.2 09/11/2017 0902   BASOSABS 0.1 11/01/2018 1657   BASOSABS 0.0  09/11/2017 0902    Hgb A1C Lab Results  Component Value Date   HGBA1C 5.7 11/07/2018           Assessment & Plan:   Preventative Health Maintenance:  She declines flu shot today Tetanus UTD Pap smear UTD Mammogram UTD Colon Screening UTD Encouraged her to consume a balanced diet and exercise regimen Advised her to see an eye doctor and dentist annually Will check CBC, CMET, Lipid, A1C and Vit D today  RTC in 1 year, sooner if needed Webb Silversmith, NP This visit occurred during the SARS-CoV-2 public health emergency.  Safety protocols were in place, including screening questions prior to the visit, additional usage of staff PPE, and extensive cleaning of exam room while observing appropriate contact time as indicated for disinfecting solutions.

## 2019-11-13 NOTE — Assessment & Plan Note (Signed)
Continue HCTZ CMET today

## 2019-11-18 ENCOUNTER — Encounter: Payer: Self-pay | Admitting: Internal Medicine

## 2019-11-18 DIAGNOSIS — E559 Vitamin D deficiency, unspecified: Secondary | ICD-10-CM

## 2019-11-18 MED ORDER — VITAMIN D (ERGOCALCIFEROL) 1.25 MG (50000 UNIT) PO CAPS: 50000.0000 [IU] | ORAL_CAPSULE | ORAL | 0 refills | Status: DC

## 2019-11-19 LAB — CBC
HCT: 43.7 % (ref 35.0–45.0)
Hemoglobin: 15.1 g/dL (ref 11.7–15.5)
MCH: 30.4 pg (ref 27.0–33.0)
MCHC: 34.6 g/dL (ref 32.0–36.0)
MCV: 88.1 fL (ref 80.0–100.0)
MPV: 11.5 fL (ref 7.5–12.5)
Platelets: 317 10*3/uL (ref 140–400)
RBC: 4.96 10*6/uL (ref 3.80–5.10)
RDW: 12.9 % (ref 11.0–15.0)
WBC: 11.6 10*3/uL — ABNORMAL HIGH (ref 3.8–10.8)

## 2019-11-19 LAB — COMPREHENSIVE METABOLIC PANEL
AG Ratio: 1.7 (calc) (ref 1.0–2.5)
ALT: 12 U/L (ref 6–29)
AST: 17 U/L (ref 10–35)
Albumin: 4.4 g/dL (ref 3.6–5.1)
Alkaline phosphatase (APISO): 58 U/L (ref 37–153)
BUN: 20 mg/dL (ref 7–25)
CO2: 26 mmol/L (ref 20–32)
Calcium: 10 mg/dL (ref 8.6–10.4)
Chloride: 102 mmol/L (ref 98–110)
Creat: 0.99 mg/dL (ref 0.50–1.05)
Globulin: 2.6 g/dL (calc) (ref 1.9–3.7)
Glucose, Bld: 101 mg/dL — ABNORMAL HIGH (ref 65–99)
Potassium: 4.3 mmol/L (ref 3.5–5.3)
Sodium: 138 mmol/L (ref 135–146)
Total Bilirubin: 0.5 mg/dL (ref 0.2–1.2)
Total Protein: 7 g/dL (ref 6.1–8.1)

## 2019-11-19 LAB — HEMOGLOBIN A1C
Hgb A1c MFr Bld: 5.7 % of total Hgb — ABNORMAL HIGH (ref <5.7)
Mean Plasma Glucose: 117 (calc)
eAG (mmol/L): 6.5 (calc)

## 2019-11-19 LAB — LIPID PANEL
Cholesterol: 203 mg/dL — ABNORMAL HIGH (ref <200)
HDL: 58 mg/dL (ref >=50)
LDL Cholesterol (Calc): 123 mg/dL (calc) — ABNORMAL HIGH
Non-HDL Cholesterol (Calc): 145 mg/dL (calc) — ABNORMAL HIGH (ref <130)
Total CHOL/HDL Ratio: 3.5 (calc) (ref <5.0)
Triglycerides: 113 mg/dL (ref <150)

## 2019-11-19 LAB — VITAMIN D 25 HYDROXY (VIT D DEFICIENCY, FRACTURES): Vit D, 25-Hydroxy: 20 ng/mL — ABNORMAL LOW (ref 30–100)

## 2019-12-06 ENCOUNTER — Other Ambulatory Visit: Payer: Self-pay | Admitting: Internal Medicine

## 2020-02-03 ENCOUNTER — Other Ambulatory Visit: Payer: Self-pay | Admitting: Internal Medicine

## 2020-02-05 ENCOUNTER — Encounter: Payer: Self-pay | Admitting: Internal Medicine

## 2020-02-08 NOTE — Telephone Encounter (Signed)
Form faxed to [employer] and copy sent to scan

## 2020-02-08 NOTE — Telephone Encounter (Signed)
Signed, placed  back in MYD box

## 2020-02-27 ENCOUNTER — Other Ambulatory Visit: Payer: Self-pay | Admitting: Internal Medicine

## 2020-03-27 DIAGNOSIS — Z20822 Contact with and (suspected) exposure to covid-19: Secondary | ICD-10-CM | POA: Diagnosis not present

## 2020-04-13 ENCOUNTER — Encounter (INDEPENDENT_AMBULATORY_CARE_PROVIDER_SITE_OTHER): Payer: Self-pay

## 2020-06-10 ENCOUNTER — Other Ambulatory Visit: Payer: Self-pay | Admitting: Hematology and Oncology

## 2020-06-10 DIAGNOSIS — Z853 Personal history of malignant neoplasm of breast: Secondary | ICD-10-CM

## 2020-06-10 DIAGNOSIS — Z9889 Other specified postprocedural states: Secondary | ICD-10-CM

## 2020-06-29 ENCOUNTER — Ambulatory Visit: Payer: Self-pay

## 2020-06-29 DIAGNOSIS — M533 Sacrococcygeal disorders, not elsewhere classified: Secondary | ICD-10-CM | POA: Diagnosis not present

## 2020-06-29 DIAGNOSIS — M4726 Other spondylosis with radiculopathy, lumbar region: Secondary | ICD-10-CM | POA: Diagnosis not present

## 2020-07-01 ENCOUNTER — Ambulatory Visit: Payer: BC Managed Care – PPO | Admitting: Family Medicine

## 2020-07-14 ENCOUNTER — Encounter: Payer: Self-pay | Admitting: Internal Medicine

## 2020-07-19 NOTE — Telephone Encounter (Signed)
Paperwork in [facility] in box

## 2020-07-19 NOTE — Telephone Encounter (Signed)
Done, given back to [name] 

## 2020-07-19 NOTE — Telephone Encounter (Signed)
PPW printed and given to Shirlean Mylar

## 2020-07-19 NOTE — Telephone Encounter (Signed)
Paperwork faxed °

## 2020-07-20 NOTE — Telephone Encounter (Signed)
Copy for scan Copy for pt 

## 2020-07-27 ENCOUNTER — Other Ambulatory Visit: Payer: Self-pay | Admitting: Internal Medicine

## 2020-07-28 ENCOUNTER — Ambulatory Visit: Payer: BC Managed Care – PPO | Admitting: Internal Medicine

## 2020-08-09 ENCOUNTER — Other Ambulatory Visit: Payer: Self-pay | Admitting: Hematology and Oncology

## 2020-08-09 ENCOUNTER — Ambulatory Visit
Admission: RE | Admit: 2020-08-09 | Discharge: 2020-08-09 | Disposition: A | Discharge status: Home / Self Care | Payer: BC Managed Care – PPO | Source: Ambulatory Visit | Attending: Hematology and Oncology | Admitting: Hematology and Oncology

## 2020-08-09 ENCOUNTER — Other Ambulatory Visit: Payer: Self-pay

## 2020-08-09 DIAGNOSIS — R921 Mammographic calcification found on diagnostic imaging of breast: Secondary | ICD-10-CM

## 2020-08-09 DIAGNOSIS — Z9889 Other specified postprocedural states: Secondary | ICD-10-CM

## 2020-08-09 DIAGNOSIS — Z853 Personal history of malignant neoplasm of breast: Secondary | ICD-10-CM

## 2020-08-19 ENCOUNTER — Ambulatory Visit
Admission: RE | Admit: 2020-08-19 | Discharge: 2020-08-19 | Disposition: A | Discharge status: Home / Self Care | Payer: BC Managed Care – PPO | Source: Ambulatory Visit | Attending: Hematology and Oncology | Admitting: Hematology and Oncology

## 2020-08-19 ENCOUNTER — Other Ambulatory Visit: Payer: Self-pay

## 2020-08-19 DIAGNOSIS — N6032 Fibrosclerosis of left breast: Secondary | ICD-10-CM | POA: Diagnosis not present

## 2020-08-19 DIAGNOSIS — R921 Mammographic calcification found on diagnostic imaging of breast: Secondary | ICD-10-CM | POA: Diagnosis not present

## 2020-08-19 DIAGNOSIS — Z9889 Other specified postprocedural states: Secondary | ICD-10-CM

## 2020-08-19 DIAGNOSIS — Z853 Personal history of malignant neoplasm of breast: Secondary | ICD-10-CM

## 2020-08-31 ENCOUNTER — Encounter: Payer: Self-pay | Admitting: Internal Medicine

## 2020-08-31 NOTE — Telephone Encounter (Signed)
This was back in 2019 and at her last physical, she did not mention trouble sleeping or using on RX for Xanax. She can schedule a virtual to discuss or wait until her physical, due 10/2020

## 2020-09-06 ENCOUNTER — Ambulatory Visit (INDEPENDENT_AMBULATORY_CARE_PROVIDER_SITE_OTHER)
Admission: RE | Admit: 2020-09-06 | Discharge: 2020-09-06 | Disposition: A | Discharge status: Home / Self Care | Payer: BC Managed Care – PPO | Source: Ambulatory Visit | Attending: Internal Medicine | Admitting: Internal Medicine

## 2020-09-06 ENCOUNTER — Other Ambulatory Visit: Payer: Self-pay

## 2020-09-06 ENCOUNTER — Encounter: Payer: Self-pay | Admitting: Internal Medicine

## 2020-09-06 ENCOUNTER — Ambulatory Visit: Payer: BC Managed Care – PPO | Admitting: Internal Medicine

## 2020-09-06 VITALS — BP 126/90 | HR 95 | Temp 96.7°F | Ht 63.0 in | Wt 222.6 lb

## 2020-09-06 DIAGNOSIS — F5104 Psychophysiologic insomnia: Secondary | ICD-10-CM

## 2020-09-06 DIAGNOSIS — M545 Low back pain, unspecified: Secondary | ICD-10-CM

## 2020-09-06 DIAGNOSIS — M25552 Pain in left hip: Secondary | ICD-10-CM

## 2020-09-06 DIAGNOSIS — G8929 Other chronic pain: Secondary | ICD-10-CM

## 2020-09-06 MED ORDER — DEXAMETHASONE SODIUM PHOSPHATE 10 MG/ML IJ SOLN
10.0000 mg | Freq: Once | INTRAMUSCULAR | Status: AC
  Administered 2020-09-06: 10 mg via INTRAMUSCULAR

## 2020-09-06 MED ORDER — ALPRAZOLAM 0.25 MG PO TABS: 0.2500 mg | ORAL_TABLET | Freq: Two times a day (BID) | ORAL | 0 refills | Status: DC | PRN

## 2020-09-06 NOTE — Progress Notes (Signed)
Subjective:    Patient ID: Stacy [name], female    DOB: 1961/05/02, 59 y.o.   MRN: [id number]  HPI  Pt presents to the clinic today with c/o low back pain. This started 3 weeks ago. She describes the pain is deep, aching, throbbing pain but can be sharp and shooting. The pain radiates into her left hip and left thigh. She denies numbness or tingling in the left leg. She denies loss of bowel or bladder control. The pain is worse with changing position from sitting to standing. She has a history of OA and fibromyalgia. She reports she fell down the steps 2 months ago, was seen at [place], xray of low back was normal. She has tried Ibuprofen, ice, heat, Blu Emu, Icy Hot with minimal relief of symptoms. She has not had back surgery. She does not see a Restaurant manager, fast food.  She would like a refill of Xanax. She has not had a RX for this since 2019. She reports it is about the only thing that will help her get to sleep right now because of this pain. She typically has difficulty falling asleep but once she gets to sleep she can stay asleep.  Review of Systems      Past Medical History:  Diagnosis Date  . Anxiety   . Arthritis   . Cancer ([place])   . Complication of anesthesia    DIFFICULTY WAKING UP , LAST SURGERY NECK 2012 WAS FINE PER PT HERE AT [facility]  . Depression   . Fibromyalgia   . Genetic testing 12/21/2016   Genetic testing reported out on 12/20/2016 through [facility]'s 46-gene Common Hereditary Cancers panel found no deleterious mutations. The following 46 genes were analyzed: APC, ATM, AXIN2, BARD1, BMPR1A, BRCA1, BRCA2, BRIP1, CDH1, CDKN2A, CHEK2, CTNNA1, DICER1, EPCAM, GREM1, KIT, MEN1, MLH1, MSH2, MSH3, MSH6, MUTYH, NBN, NF1, NTHL1, PALB2, PDGFRA, PMS2, POLD1, POLE, PTEN, RAD50, RAD51C, RAD51D, SDH  . Headache    HX MIGRAINES    . Heart murmur   . History of radiation therapy 02/14/17-04/03/2017   left breast 60.4 Gy: 50.4 Gy delivered in 28 fractions and a boost of 10 Gy delivered in  5 fractions.  . Hypertension   . Malignant neoplasm of upper-outer quadrant of left female breast ([place]) 08/23/2016  . Mitral valve disorder   . MVP (mitral valve prolapse)    AS INFANT  . Neck pain   . Personal history of chemotherapy   . Personal history of radiation therapy   . Vision abnormalities     Current Outpatient Medications  Medication Sig Dispense Refill  . alum & mag hydroxide-simeth (MAALOX/MYLANTA) 200-200-20 MG/5ML suspension Take 15 mLs by mouth every 6 (six) hours as needed for indigestion or heartburn.    . fluticasone (FLOVENT HFA) 44 MCG/ACT inhaler Inhale 2 puffs into the lungs 2 (two) times daily. 1 Inhaler 0  . hydrochlorothiazide (HYDRODIURIL) 25 MG tablet TAKE 1 TABLET BY MOUTH EVERY DAY 90 tablet 0  . ibuprofen (ADVIL,MOTRIN) 200 MG tablet Take 400 mg by mouth every 6 (six) hours as needed for mild pain.    Marland Kitchen ondansetron (ZOFRAN) 4 MG tablet Take 1 tablet (4 mg total) by mouth every 6 (six) hours as needed for nausea or vomiting. 12 tablet 0  . pantoprazole (PROTONIX) 40 MG tablet Take 1 tablet (40 mg total) by mouth daily. 28 tablet 0  . Vitamin D, Ergocalciferol, (DRISDOL) 1.25 MG (50000 UNIT) CAPS capsule Take 1 capsule (50,000 Units total) by mouth every  7 (seven) days. 12 capsule 0   No current facility-administered medications for this visit.    Allergies  Allergen Reactions  . Gabapentin Swelling    "makes me feel detached"  . Naproxen Palpitations  . Neulasta [Pegfilgrastim] Itching and Other (See Comments)    Patient stated,"reddish skin tone, body on fire, tingly tongue."  . Erythromycin Nausea And Vomiting  . Gabapentin Other (See Comments)    "Makes me feel out of my body"  . Neulasta [Pegfilgrastim] Itching  . Tramadol Other (See Comments)    headache  . Erythromycin Nausea And Vomiting  . Naproxen Palpitations  . Tramadol Hcl Other (See Comments)    Headaches    Family History  Adopted: Yes  Problem Relation Age of Onset  . Breast  cancer Brother 67       Maternal half-brother. Currently 70.  Marland Kitchen BRCA 1/2 Neg Hx     Social History   Socioeconomic History  . Marital status: Married    Spouse name: Not on file  . Number of children: 4  . Years of education: Not on file  . Highest education level: Not on file  Occupational History  . Not on file  Tobacco Use  . Smoking status: Never Smoker  . Smokeless tobacco: Never Used  Vaping Use  . Vaping Use: Never used  Substance and Sexual Activity  . Alcohol use: Not Currently  . Drug use: Not Currently  . Sexual activity: Yes    Birth control/protection: Post-menopausal  Other Topics Concern  . Not on file  Social History Narrative   ** Merged History Encounter **       Social Determinants of Health   Financial Resource Strain: Not on file  Food Insecurity: Not on file  Transportation Needs: Not on file  Physical Activity: Not on file  Stress: Not on file  Social Connections: Not on file  Intimate Partner Violence: Not on file     Constitutional: Denies fever, malaise, fatigue, headache or abrupt weight changes.  Respiratory: Denies difficulty breathing, shortness of breath, cough or sputum production.   Cardiovascular: Denies chest pain, chest tightness, palpitations or swelling in the hands or feet.  Gastrointestinal: Denies abdominal pain, bloating, constipation, diarrhea or blood in the stool.  GU: Denies urgency, frequency, pain with urination, burning sensation, blood in urine, odor or discharge. Musculoskeletal: Pt reports low back pain, left hip pain. Denies muscle pain or joint swelling.  Skin: Denies redness, rashes, lesions or ulcercations.  Neurological: Pt reports insomnia. Denies dizziness, difficulty with memory, difficulty with speech or problems with balance and coordination.    No other specific complaints in a complete review of systems (except as listed in HPI above).  Objective:   Physical Exam  BP 126/90 (BP Location: Right  Arm, Patient Position: Sitting, Cuff Size: Large)   Pulse 95   Temp (!) 96.7 F (35.9 C) (Temporal)   Ht 5' 3"  (1.6 m)   Wt 222 lb 9.6 oz (101 kg)   LMP 11/07/2016   SpO2 98%   BMI 39.43 kg/m   Wt Readings from Last 3 Encounters:  11/13/19 225 lb (102.1 kg)  11/26/18 226 lb (102.5 kg)  11/07/18 227 lb (103 kg)    General: Appears her stated age, obese, in NAD. Skin: Warm, dry and intact. No rashes noted. Cardiovascular: Normal rate and rhythm. S1,S2 noted.  No murmur, rubs or gallops noted.  Pulmonary/Chest: Normal effort and positive vesicular breath sounds. No respiratory distress. No wheezes, rales  or ronchi noted.  Abdomen: No CVA tenderness noted. Musculoskeletal: Pain with flexion, extension, rotation to the left and lateral bending to the right. Normal rotation to the right and lateral bending to the left. Normal flexion, extension, abduction, adduction of the left hip. Pain with internal and external rotation of the left hip. No pain over the trochanter. Strength 5/5. Able to stand on heels and toes. Limping gait. Neurological: Alert and oriented.    BMET    Component Value Date/Time   NA 138 11/13/2019 1445   NA 138 09/11/2017 0902   K 4.3 11/13/2019 1445   K 3.5 09/11/2017 0902   CL 102 11/13/2019 1445   CO2 26 11/13/2019 1445   CO2 25 09/11/2017 0902   GLUCOSE 101 (H) 11/13/2019 1445   GLUCOSE 98 09/11/2017 0902   BUN 20 11/13/2019 1445   BUN 18.2 09/11/2017 0902   CREATININE 0.99 11/13/2019 1445   CREATININE 1.0 09/11/2017 0902   CALCIUM 10.0 11/13/2019 1445   CALCIUM 9.4 09/11/2017 0902   GFRNONAA >60 11/01/2018 1657   GFRAA >60 11/01/2018 1657    Lipid Panel     Component Value Date/Time   CHOL 203 (H) 11/13/2019 1445   TRIG 113 11/13/2019 1445   HDL 58 11/13/2019 1445   CHOLHDL 3.5 11/13/2019 1445   VLDL 23.4 11/07/2018 1432   LDLCALC 123 (H) 11/13/2019 1445    CBC    Component Value Date/Time   WBC 11.6 (H) 11/13/2019 1445   RBC 4.96  11/13/2019 1445   HGB 15.1 11/13/2019 1445   HGB 13.0 09/11/2017 0902   HCT 43.7 11/13/2019 1445   HCT 38.6 09/11/2017 0902   PLT 317 11/13/2019 1445   PLT 263 09/11/2017 0902   MCV 88.1 11/13/2019 1445   MCV 88.7 09/11/2017 0902   MCH 30.4 11/13/2019 1445   MCHC 34.6 11/13/2019 1445   RDW 12.9 11/13/2019 1445   RDW 13.4 09/11/2017 0902   LYMPHSABS 2.8 11/01/2018 1657   LYMPHSABS 1.4 09/11/2017 0902   MONOABS 1.3 (H) 11/01/2018 1657   MONOABS 0.8 09/11/2017 0902   EOSABS 0.1 11/01/2018 1657   EOSABS 0.2 09/11/2017 0902   BASOSABS 0.1 11/01/2018 1657   BASOSABS 0.0 09/11/2017 0902    Hgb A1C Lab Results  Component Value Date   HGBA1C 5.7 (H) 11/13/2019           Assessment & Plan:   Chronic Low Back Pain, Left Hip Pain:  She reports normal xray of lumbar spine 2 months ago- declines repeat today Xray left hip today Decadron 10 mg IM today  Insomnia:  Xanax refilled today Will need to obtain CSA and UDS at next visit  Will follow up after xray, return precautions discussed Webb Silversmith, NP This visit occurred during the SARS-CoV-2 public health emergency.  Safety protocols were in place, including screening questions prior to the visit, additional usage of staff PPE, and extensive cleaning of exam room while observing appropriate contact time as indicated for disinfecting solutions.

## 2020-09-06 NOTE — Patient Instructions (Signed)
Hip Exercises Ask your health care provider which exercises are safe for you. Do exercises exactly as told by your health care provider and adjust them as directed. It is normal to feel mild stretching, pulling, tightness, or discomfort as you do these exercises. Stop right away if you feel sudden pain or your pain gets worse. Do not begin these exercises until told by your health care provider. Stretching and range-of-motion exercises These exercises warm up your muscles and joints and improve the movement and flexibility of your hip. These exercises also help to relieve pain, numbness, and tingling. You may be asked to limit your range of motion if you had a hip replacement. Talk to your health care provider about these restrictions. Hamstrings, supine  1. Lie on your back (supine position). 2. Loop a belt or towel over the ball of your left / right foot. The ball of your foot is on the walking surface, right under your toes. 3. Straighten your left / right knee and slowly pull on the belt or towel to raise your leg until you feel a gentle stretch behind your knee (hamstring). ? Do not let your knee bend while you do this. ? Keep your other leg flat on the floor. 4. Hold this position for __________ seconds. 5. Slowly return your leg to the starting position. Repeat __________ times. Complete this exercise __________ times a day. Hip rotation  1. Lie on your back on a firm surface. 2. With your left / right hand, gently pull your left / right knee toward the shoulder that is on the same side of the body. Stop when your knee is pointing toward the ceiling. 3. Hold your left / right ankle with your other hand. 4. Keeping your knee steady, gently pull your left / right ankle toward your other shoulder until you feel a stretch in your buttocks. ? Keep your hips and shoulders firmly planted while you do this stretch. 5. Hold this position for __________ seconds. Repeat __________ times. Complete  this exercise __________ times a day. Seated stretch This exercise is sometimes called hamstrings and adductors stretch. 1. Sit on the floor with your legs stretched wide. Keep your knees straight during this exercise. 2. Keeping your head and back in a straight line, bend at your waist to reach for your left foot (position A). You should feel a stretch in your right inner thigh (adductors). 3. Hold this position for __________ seconds. Then slowly return to the upright position. 4. Keeping your head and back in a straight line, bend at your waist to reach forward (position B). You should feel a stretch behind both of your thighs and knees (hamstrings). 5. Hold this position for __________ seconds. Then slowly return to the upright position. 6. Keeping your head and back in a straight line, bend at your waist to reach for your right foot (position C). You should feel a stretch in your left inner thigh (adductors). 7. Hold this position for __________ seconds. Then slowly return to the upright position. Repeat __________ times. Complete this exercise __________ times a day. Lunge This exercise stretches the muscles of the hip (hip flexors). 1. Place your left / right knee on the floor and bend your other knee so that is directly over your ankle. You should be half-kneeling. 2. Keep good posture with your head over your shoulders. 3. Tighten your buttocks to point your tailbone downward. This will prevent your back from arching too much. 4. You should feel a   gentle stretch in the front of your left / right thigh and hip. If you do not feel a stretch, slide your other foot forward slightly and then slowly lunge forward with your chest up until your knee once again lines up over your ankle. ? Make sure your tailbone continues to point downward. 5. Hold this position for __________ seconds. 6. Slowly return to the starting position. Repeat __________ times. Complete this exercise __________ times a  day. Strengthening exercises These exercises build strength and endurance in your hip. Endurance is the ability to use your muscles for a long time, even after they get tired. Bridge This exercise strengthens the muscles of your hip (hip extensors). 1. Lie on your back on a firm surface with your knees bent and your feet flat on the floor. 2. Tighten your buttocks muscles and lift your bottom off the floor until the trunk of your body and your hips are level with your thighs. ? Do not arch your back. ? You should feel the muscles working in your buttocks and the back of your thighs. If you do not feel these muscles, slide your feet 1-2 inches (2.5-5 cm) farther away from your buttocks. 3. Hold this position for __________ seconds. 4. Slowly lower your hips to the starting position. 5. Let your muscles relax completely between repetitions. Repeat __________ times. Complete this exercise __________ times a day. Straight leg raises, side-lying This exercise strengthens the muscles that move the hip joint away from the center of the body (hip abductors). 1. Lie on your side with your left / right leg in the top position. Lie so your head, shoulder, hip, and knee line up. You may bend your bottom knee slightly to help you balance. 2. Roll your hips slightly forward, so your hips are stacked directly over each other and your left / right knee is facing forward. 3. Leading with your heel, lift your top leg 4-6 inches (10-15 cm). You should feel the muscles in your top hip lifting. ? Do not let your foot drift forward. ? Do not let your knee roll toward the ceiling. 4. Hold this position for __________ seconds. 5. Slowly return to the starting position. 6. Let your muscles relax completely between repetitions. Repeat __________ times. Complete this exercise __________ times a day. Straight leg raises, side-lying This exercise strengthens the muscles that move the hip joint toward the center of the  body (hip adductors). 1. Lie on your side with your left / right leg in the bottom position. Lie so your head, shoulder, hip, and knee line up. You may place your upper foot in front to help you balance. 2. Roll your hips slightly forward, so your hips are stacked directly over each other and your left / right knee is facing forward. 3. Tense the muscles in your inner thigh and lift your bottom leg 4-6 inches (10-15 cm). 4. Hold this position for __________ seconds. 5. Slowly return to the starting position. 6. Let your muscles relax completely between repetitions. Repeat __________ times. Complete this exercise __________ times a day. Straight leg raises, supine This exercise strengthens the muscles in the front of your thigh (quadriceps). 1. Lie on your back (supine position) with your left / right leg extended and your other knee bent. 2. Tense the muscles in the front of your left / right thigh. You should see your kneecap slide up or see increased dimpling just above your knee. 3. Keep these muscles tight as you raise your   leg 4-6 inches (10-15 cm) off the floor. Do not let your knee bend. 4. Hold this position for __________ seconds. 5. Keep these muscles tense as you lower your leg. 6. Relax the muscles slowly and completely between repetitions. Repeat __________ times. Complete this exercise __________ times a day. Hip abductors, standing This exercise strengthens the muscles that move the leg and hip joint away from the center of the body (hip abductors). 1. Tie one end of a rubber exercise band or tubing to a secure surface, such as a chair, table, or pole. 2. Loop the other end of the band or tubing around your left / right ankle. 3. Keeping your ankle with the band or tubing directly opposite the secured end, step away until there is tension in the tubing or band. Hold on to a chair, table, or pole as needed for balance. 4. Lift your left / right leg out to your side. While you do  this: ? Keep your back upright. ? Keep your shoulders over your hips. ? Keep your toes pointing forward. ? Make sure to use your hip muscles to slowly lift your leg. Do not tip your body or forcefully lift your leg. 5. Hold this position for __________ seconds. 6. Slowly return to the starting position. Repeat __________ times. Complete this exercise __________ times a day. Squats This exercise strengthens the muscles in the front of your thigh (quadriceps). 1. Stand in a door frame so your feet and knees are in line with the frame. You may place your hands on the frame for balance. 2. Slowly bend your knees and lower your hips like you are going to sit in a chair. ? Keep your lower legs in a straight-up-and-down position. ? Do not let your hips go lower than your knees. ? Do not bend your knees lower than told by your health care provider. ? If your hip pain increases, do not bend as low. 3. Hold this position for ___________ seconds. 4. Slowly push with your legs to return to standing. Do not use your hands to pull yourself to standing. Repeat __________ times. Complete this exercise __________ times a day. This information is not intended to replace advice given to you by your health care provider. Make sure you discuss any questions you have with your health care provider. Document Revised: 04/09/2019 Document Reviewed: 07/15/2018 [employer] Patient Education  2020 [employer]  

## 2020-09-07 ENCOUNTER — Encounter: Payer: Self-pay | Admitting: Internal Medicine

## 2020-09-12 ENCOUNTER — Encounter: Payer: Self-pay | Admitting: Internal Medicine

## 2020-09-12 DIAGNOSIS — M25551 Pain in right hip: Secondary | ICD-10-CM

## 2020-09-15 ENCOUNTER — Encounter: Payer: Self-pay | Admitting: Internal Medicine

## 2020-09-15 NOTE — Telephone Encounter (Signed)
Recommend Delsym, Mucinex OTC. Should set up VV if needs anything RX. Give UC/ER precautions over the weekend.

## 2020-09-20 ENCOUNTER — Ambulatory Visit: Payer: BC Managed Care – PPO | Admitting: Orthopaedic Surgery

## 2020-09-30 DIAGNOSIS — Z20822 Contact with and (suspected) exposure to covid-19: Secondary | ICD-10-CM | POA: Diagnosis not present

## 2020-10-11 ENCOUNTER — Other Ambulatory Visit: Payer: Self-pay | Admitting: Physician Assistant

## 2020-10-11 ENCOUNTER — Ambulatory Visit: Payer: BC Managed Care – PPO | Admitting: Orthopaedic Surgery

## 2020-10-11 ENCOUNTER — Encounter: Payer: Self-pay | Admitting: Orthopaedic Surgery

## 2020-10-11 ENCOUNTER — Ambulatory Visit: Payer: Self-pay

## 2020-10-11 DIAGNOSIS — G8929 Other chronic pain: Secondary | ICD-10-CM | POA: Diagnosis not present

## 2020-10-11 DIAGNOSIS — M5442 Lumbago with sciatica, left side: Secondary | ICD-10-CM

## 2020-10-11 MED ORDER — PREDNISONE 10 MG (21) PO TBPK: ORAL_TABLET | ORAL | 0 refills | Status: DC

## 2020-10-11 MED ORDER — MELOXICAM 7.5 MG PO TABS: 7.5000 mg | ORAL_TABLET | Freq: Every day | ORAL | 2 refills | Status: AC

## 2020-10-11 MED ORDER — CYCLOBENZAPRINE HCL 5 MG PO TABS: 5.0000 mg | ORAL_TABLET | Freq: Three times a day (TID) | ORAL | 0 refills | Status: DC | PRN

## 2020-10-11 NOTE — Progress Notes (Signed)
Office Visit Note   Patient: Stacy [name]           Date of Birth: 07/02/1961           MRN: [id number] Visit Date: 10/11/2020              Requested by: [name], NP [place],  [place] [address] PCP: [name], NP   Assessment & Plan: Visit Diagnoses:  1. Chronic left-sided low back pain with left-sided sciatica     Plan: Impression is chronic left lower back pain and left lower extremity radiculopathy.  I have started the patient on a steroid taper and muscle relaxer.  Have also sent in an internal referral for physical therapy.  Should her symptoms not improve or worsen over the next several weeks she will call us and we will order an MRI of her lumbar spine.  Otherwise, follow-up with [place] as needed.  Follow-Up Instructions: Return if symptoms worsen or fail to improve.   Orders:  Orders Placed This Encounter  Procedures  . XR Lumbar Spine 2-3 Views  . Ambulatory referral to Physical Therapy   Meds ordered this encounter  Medications  . predniSONE (STERAPRED UNI-PAK 21 TAB) 10 MG (21) TBPK tablet    Sig: Take as directed    Dispense:  21 tablet    Refill:  0  . cyclobenzaprine (FLEXERIL) 5 MG tablet    Sig: Take 1 tablet (5 mg total) by mouth 3 (three) times daily as needed for muscle spasms.    Dispense:  30 tablet    Refill:  0  . meloxicam (MOBIC) 7.5 MG tablet    Sig: Take 1 tablet (7.5 mg total) by mouth daily for 14 doses. May take as needed once finished with steroid taper    Dispense:  30 tablet    Refill:  2      Procedures: No procedures performed   Clinical Data: No additional findings.   Subjective: Chief Complaint  Patient presents with  . Left Hip - Pain  . Lower Back - Pain    HPI patient is a pleasant 60 year old female who comes in today with left lower back and hip pain.  This began in mid October of this past year after slipping on stairs landing on her buttocks.  Pain she has had is to the left  lower back and radiates down the anterior and lateral thigh and occasionally into the groin.  Her pain is worse going from a seated to standing position as well as getting in and out of the car and sleeping.  She notes some electric type shock at times especially with lumbar extension.  She has tried a heating pad, ibuprofen and muscle relaxers as well as IM steroids without long-lasting relief of symptoms.  No bowel or bladder change or saddle paresthesias.  Review of Systems as detailed in HPI.  All others reviewed and are negative.   Objective: Vital Signs: LMP 11/07/2016   Physical Exam well-developed well-nourished female no acute distress.  Alert and oriented x3.  Ortho Exam left hip exam shows a negative logroll negative FADIR.  Negative straight leg raise.  She has increased pain with lumbar extension and rotation.  No pain with flexion.  She has moderate tenderness to the left lumbar paraspinous musculature.  No focal weakness.  She is neurovascular intact distally.  Specialty Comments:  No specialty comments available.  Imaging: XR Lumbar Spine 2-3 Views  Result  Date: 10/11/2020 Mild scoliosis.  Multilevel spondylosis with anterior spondylolisthesis at L5.  No acute findings.    PMFS History: Patient Active Problem List   Diagnosis Date Noted  . Port catheter in place 10/09/2016  . Malignant neoplasm of upper-outer quadrant of left female breast ([place]) 08/23/2016  . Anxiety and depression 04/20/2016  . Arthritis 06/27/2015  . Essential hypertension 04/19/2009  . Fibromyalgia 02/18/2008  . MITRAL VALVE PROLAPSE, HX OF 02/18/2008   Past Medical History:  Diagnosis Date  . Anxiety   . Arthritis   . Cancer ([place])   . Complication of anesthesia    DIFFICULTY WAKING UP , LAST SURGERY NECK 2012 WAS FINE PER PT HERE AT [facility]  . Depression   . Fibromyalgia   . Genetic testing 12/21/2016   Genetic testing reported out on 12/20/2016 through [facility]'s 46-gene Common Hereditary  Cancers panel found no deleterious mutations. The following 46 genes were analyzed: APC, ATM, AXIN2, BARD1, BMPR1A, BRCA1, BRCA2, BRIP1, CDH1, CDKN2A, CHEK2, CTNNA1, DICER1, EPCAM, GREM1, KIT, MEN1, MLH1, MSH2, MSH3, MSH6, MUTYH, NBN, NF1, NTHL1, PALB2, PDGFRA, PMS2, POLD1, POLE, PTEN, RAD50, RAD51C, RAD51D, SDH  . Headache    HX MIGRAINES    . Heart murmur   . History of radiation therapy 02/14/17-04/03/2017   left breast 60.4 Gy: 50.4 Gy delivered in 28 fractions and a boost of 10 Gy delivered in 5 fractions.  . Hypertension   . Malignant neoplasm of upper-outer quadrant of left female breast ([place]) 08/23/2016  . Mitral valve disorder   . MVP (mitral valve prolapse)    AS INFANT  . Neck pain   . Personal history of chemotherapy   . Personal history of radiation therapy   . Vision abnormalities     Family History  Adopted: Yes  Problem Relation Age of Onset  . Breast cancer Brother 25       Maternal half-brother. Currently 68.  Marland Kitchen BRCA 1/2 Neg Hx     Past Surgical History:  Procedure Laterality Date  . BREAST BIOPSY Left 2017  . BREAST LUMPECTOMY Left 2017  . cervical fusiion  2012  . HIP SURGERY    . JOINT REPLACEMENT    . MITRAL VALVE REPAIR     MVP 1970  . MITRAL VALVE REPAIR    . PORT-A-CATH REMOVAL N/A 10/23/2017   Procedure: REMOVAL PORT-A-CATH;  Surgeon: [name], MD;  Location: [place];  Service: General;  Laterality: N/A;  . PORTACATH PLACEMENT Right 09/06/2016   Procedure: INSERTION PORT-A-CATH;  Surgeon: [name], MD;  Location: [place];  Service: General;  Laterality: Right;  . RADIOACTIVE SEED GUIDED PARTIAL MASTECTOMY WITH AXILLARY SENTINEL LYMPH NODE BIOPSY Left 09/06/2016   Procedure: RADIOACTIVE SEED GUIDED LEFT BREAST LUMPECTOMY  WITH SENTINEL LYMPH NODE BIOPSY;  Surgeon: [name], MD;  Location: [place];  Service: General;  Laterality: Left;, Per patient just "lumpectomy"  . ROTATOR CUFF REPAIR Right 2012  .  SHOULDER SURGERY    . TOTAL HIP ARTHROPLASTY Right 09/14/2015   Procedure: RIGHT TOTAL HIP ARTHROPLASTY ANTERIOR APPROACH;  Surgeon: Leandrew Koyanagi, MD;  Location: [place];  Service: Orthopedics;  Laterality: Right;  . TUBAL LIGATION  1988   Social History   Occupational History  . Not on file  Tobacco Use  . Smoking status: Never Smoker  . Smokeless tobacco: Never Used  Vaping Use  . Vaping Use: Never used  Substance and Sexual Activity  . Alcohol use: Not Currently  .  Drug use: Not Currently  . Sexual activity: Yes    Birth control/protection: Post-menopausal

## 2020-10-19 ENCOUNTER — Other Ambulatory Visit: Payer: Self-pay | Admitting: Internal Medicine

## 2020-10-24 ENCOUNTER — Other Ambulatory Visit: Payer: Self-pay

## 2020-10-24 ENCOUNTER — Encounter: Payer: Self-pay | Admitting: Rehabilitative and Restorative Service Providers"

## 2020-10-24 ENCOUNTER — Ambulatory Visit (INDEPENDENT_AMBULATORY_CARE_PROVIDER_SITE_OTHER): Payer: BC Managed Care – PPO | Admitting: Rehabilitative and Restorative Service Providers"

## 2020-10-24 DIAGNOSIS — M545 Low back pain, unspecified: Secondary | ICD-10-CM

## 2020-10-24 DIAGNOSIS — M79605 Pain in left leg: Secondary | ICD-10-CM

## 2020-10-24 DIAGNOSIS — M6281 Muscle weakness (generalized): Secondary | ICD-10-CM | POA: Diagnosis not present

## 2020-10-24 DIAGNOSIS — G8929 Other chronic pain: Secondary | ICD-10-CM

## 2020-10-24 DIAGNOSIS — R262 Difficulty in walking, not elsewhere classified: Secondary | ICD-10-CM

## 2020-10-24 NOTE — Patient Instructions (Signed)
Access Code: [email or web address] URL: [email or web address] Date: 10/24/2020 Prepared by: Scot Jun  Exercises Supine Lower Trunk Rotation - 2 x daily - 7 x weekly - 1 sets - 5 reps - 15 hold Clamshell - 2 x daily - 7 x weekly - 3 sets - 10 reps Supine Bridge - 2 x daily - 7 x weekly - 3 sets - 10 reps - 2 hold Standing Lumbar Extension - 2 x daily - 7 x weekly - 2 sets - 5-10 reps

## 2020-10-24 NOTE — Therapy (Signed)
[facility] Physical Therapy [address] [place], [place], [address] Phone: [phone]   Fax:  [phone]  Physical Therapy Evaluation  Patient Details  Name: Stacy [name] MRN: [id number] Date of Birth: 08-09-1961 Referring Provider (PT): Dwana Melena, [place]   Encounter Date: 10/24/2020   PT End of Session - 10/24/20 0805    Visit Number 1    Number of Visits 12    Date for PT Re-Evaluation 12/19/20    Progress Note Due on Visit 10    PT Start Time 0805    PT Stop Time 0843    PT Time Calculation (min) 38 min    Activity Tolerance Patient tolerated treatment well    Behavior During Therapy [facility] for tasks assessed/performed           Past Medical History:  Diagnosis Date  . Anxiety   . Arthritis   . Cancer ([place])   . Complication of anesthesia    DIFFICULTY WAKING UP , LAST SURGERY NECK 2012 WAS FINE PER PT HERE AT [facility]  . Depression   . Fibromyalgia   . Genetic testing 12/21/2016   Genetic testing reported out on 12/20/2016 through [facility]'s 46-gene Common Hereditary Cancers panel found no deleterious mutations. The following 46 genes were analyzed: APC, ATM, AXIN2, BARD1, BMPR1A, BRCA1, BRCA2, BRIP1, CDH1, CDKN2A, CHEK2, CTNNA1, DICER1, EPCAM, GREM1, KIT, MEN1, MLH1, MSH2, MSH3, MSH6, MUTYH, NBN, NF1, NTHL1, PALB2, PDGFRA, PMS2, POLD1, POLE, PTEN, RAD50, RAD51C, RAD51D, SDH  . Headache    HX MIGRAINES    . Heart murmur   . History of radiation therapy 02/14/17-04/03/2017   left breast 60.4 Gy: 50.4 Gy delivered in 28 fractions and a boost of 10 Gy delivered in 5 fractions.  . Hypertension   . Malignant neoplasm of upper-outer quadrant of left female breast ([place]) 08/23/2016  . Mitral valve disorder   . MVP (mitral valve prolapse)    AS INFANT  . Neck pain   . Personal history of chemotherapy   . Personal history of radiation therapy   . Vision abnormalities     Past Surgical History:  Procedure Laterality Date  . BREAST BIOPSY Left 2017   . BREAST LUMPECTOMY Left 2017  . cervical fusiion  2012  . HIP SURGERY    . JOINT REPLACEMENT    . MITRAL VALVE REPAIR     MVP 1970  . MITRAL VALVE REPAIR    . PORT-A-CATH REMOVAL N/A 10/23/2017   Procedure: REMOVAL PORT-A-CATH;  Surgeon: [name], MD;  Location: [place];  Service: General;  Laterality: N/A;  . PORTACATH PLACEMENT Right 09/06/2016   Procedure: INSERTION PORT-A-CATH;  Surgeon: [name], MD;  Location: [place];  Service: General;  Laterality: Right;  . RADIOACTIVE SEED GUIDED PARTIAL MASTECTOMY WITH AXILLARY SENTINEL LYMPH NODE BIOPSY Left 09/06/2016   Procedure: RADIOACTIVE SEED GUIDED LEFT BREAST LUMPECTOMY  WITH SENTINEL LYMPH NODE BIOPSY;  Surgeon: [name], MD;  Location: [place];  Service: General;  Laterality: Left;, Per patient just "lumpectomy"  . ROTATOR CUFF REPAIR Right 2012  . SHOULDER SURGERY    . TOTAL HIP ARTHROPLASTY Right 09/14/2015   Procedure: RIGHT TOTAL HIP ARTHROPLASTY ANTERIOR APPROACH;  Surgeon: Leandrew Koyanagi, MD;  Location: [place];  Service: Orthopedics;  Laterality: Right;  . TUBAL LIGATION  1988    There were no vitals filed for this visit.    Subjective Assessment - 10/24/20 0808    Subjective Pt. stated back on and off  chronically but mid Oct 2021 fell on stairs and pain has progressively worsened and noticed in outside of Lt LE to knee and sometimes inside of knee with some increased pain compared to back at times. Pt. rated Lt LE as pain not numbness/tingling.  Having difficulty sleeping at this time (history of Rt THA 2016).    Pertinent History Cancer history    Limitations Sitting;Standing;Walking;House hold activities    Diagnostic tests xray 10/11/2020    Patient Stated Goals Reduce pain    Currently in Pain? Yes    Pain Score 4    at worst 9/10   Pain Location Back    Pain Orientation Left    Pain Descriptors / Indicators Aching;Throbbing;Shooting    Pain Type Chronic pain    Pain  Radiating Towards Lt LE    Pain Onset More than a month ago    Pain Frequency Intermittent    Aggravating Factors  sleeping difficulty, sitting, transfers, car transfers    Pain Relieving Factors unsure, sometimes bending forward (helps both)    Effect of Pain on Daily Activities Long walking (shopping), transfers              [facility] PT Assessment - 10/24/20 0001      Assessment   Medical Diagnosis Low back pain, Lt LE radicular    Referring Provider (PT) Dwana Melena, PA-C    Onset Date/Surgical Date 07/01/20    Hand Dominance Right      Precautions   Precaution Comments History of cancer      Balance Screen   Has the patient fallen in the past 6 months Yes    How many times? at least 2 (stairs, off stool)      [place] residence    Additional Comments 5 steps to enter home      Prior [place] Requirements Work from home    [employer], grandkids      Cognition   Overall Cognitive Status Within Functional Limits for tasks assessed      Observation/Other Assessments   Focus on Therapeutic Outcomes (FOTO)  intake 38%, outcome expected 55      Posture/Postural Control   Posture Comments Reduced lumbar lordosis in standing      ROM / Strength   AROM / PROM / Strength Strength;PROM;AROM      AROM   Overall AROM Comments Central lumbar pain pre assessment:    AROM Assessment Site Lumbar    Lumbar Flexion to ankle with no change    Lumbar Extension 25% c ERP    Lumbar - Right Side Bend lateral femoral epicondyle c central LBP    Lumbar - Left Side Bend lateral femoral epicondyle c central LBP      Strength   Strength Assessment Site Hip;Knee;Ankle    Right/Left Hip Left;Right    Right Hip Flexion 5/5    Left Hip Flexion 5/5    Left Hip ABduction 4/5    Right/Left Knee Left;Right    Right Knee Flexion 5/5    Right Knee Extension 5/5    Left Knee Flexion 5/5    Left Knee Extension 5/5     Right/Left Ankle Left;Right    Right Ankle Dorsiflexion 5/5    Left Ankle Dorsiflexion 5/5      Palpation   Spinal mobility Lower lumbar limitation L3-L5 c extension movements    Palpation comment Concordant localized lateral hip symptoms Lt c palpation to  Trigger points in Lt glute med/min      Special Tests   Other special tests (-) slump bilateral.  passive slr to 90 deg bilateral c central back pain produced c each      Transfers   Comments Pain in 18 inch chair transfer                      Objective measurements completed on examination: See above findings.       [place] Adult PT Treatment/Exercise - 10/24/20 0001      Self-Care   Self-Care Other Self-Care Comments    Other Self-Care Comments  Postural cues for standing, extension mobility after prolonged sitting.  Cues given on aerobic exercis such as walking and adjustment to pain response.      Exercises   Exercises Other Exercises;Lumbar    Other Exercises  HEP instruction/performance c cues for techniques.  1 set of each exercise performed for trial per handout provided.  HEP: supine brige, lumbar trunk rotation, sidelying clam shell, standing lumbar extension                  PT Education - 10/24/20 0914    Education Details HEP, POC.  Brief discussion about if symptoms don't improve, possible scanning may be warrented due to cancer history.    Person(s) Educated Patient    Methods Explanation;Demonstration;Verbal cues;Handout    Comprehension Verbalized understanding;Returned demonstration            PT Short Term Goals - 10/24/20 0805      PT SHORT TERM GOAL #1   Title Patient will demonstrate independent use of home exercise program to maintain progress from in clinic treatments.    Time 3    Period Weeks    Status New    Target Date 11/14/20             PT Long Term Goals - 10/24/20 0914      PT LONG TERM GOAL #1   Title Patient will demonstrate/report pain at worst less  than or equal to 2/10 to facilitate minimal limitation in daily activity secondary to pain symptoms.    Time 8    Period Weeks    Status New    Target Date 12/19/20      PT LONG TERM GOAL #2   Title Patient will demonstrate independent use of home exercise program to facilitate ability to maintain/progress functional gains from skilled physical therapy services.    Time 8    Period Weeks    Status New    Target Date 12/19/20      PT LONG TERM GOAL #3   Title Patient will demonstrate lumbar extension 100 % WFL s symptoms to facilitate upright standing, walking posture at PLOF s limitation.    Time 8    Period Weeks    Status New    Target Date 12/19/20      PT LONG TERM GOAL #4   Title Pt. will demonstrate Lt hip MMT 5/5 throughout to facilitate ability to perform usual transfers, walking at PLOF s limitation.    Time 8    Period Weeks    Status New    Target Date 12/19/20      PT LONG TERM GOAL #5   Title Pt. will demonstrate FOTO 55 or greater to indicated reduced disability due to condition.    Time 8    Period Weeks    Status New  Target Date 12/19/20                  Plan - 10/24/20 0816    Clinical Impression Statement Patient is a  60 y.o. female who comes to clinic with complaints of low back pain, Lt leg pain with mobility, strength deficits that impair their ability to perform usual daily and recreational functional activities without increase difficulty/symptoms at this time.  Patient to benefit from skilled PT services to address impairments and limitations to improve to previous level of function without restriction secondary to condition.    Personal Factors and Comorbidities Comorbidity 3+    Comorbidities Fibromyalgia, history of cancer, HTN,    Examination-Activity Limitations Sit;Sleep;Stairs;Stand;Bend;Squat;Transfers;Locomotion Level;Lift    Examination-Participation Restrictions Shop;Community Activity;Cleaning;Laundry;Yard Work     Stability/Clinical Decision Making Stable/Uncomplicated    Clinical Decision Making Low    Rehab Potential Good    PT Frequency --   1-2x/week   PT Duration 8 weeks    PT Treatment/Interventions ADLs/Self Care Home Management;Cryotherapy;Electrical Stimulation;Iontophoresis 12m/ml Dexamethasone;Moist Heat;Traction;Balance training;Therapeutic exercise;Therapeutic activities;Functional mobility training;Stair training;Gait training;DME Instruction;Ultrasound;Neuromuscular re-education;Patient/family education;Manual techniques;Taping;Dry needling;Passive range of motion;Spinal Manipulations;Joint Manipulations    PT Next Visit Plan Lumbar PAIVM intervention for mobility gains, reassess radicular connection vs. lateral hip trigger point symptoms.    PT Home Exercise Plan [email or web address]    Consulted and Agree with Plan of Care Patient           Patient will benefit from skilled therapeutic intervention in order to improve the following deficits and impairments:  Decreased endurance,Hypomobility,Decreased activity tolerance,Decreased strength,Pain,Difficulty walking,Decreased mobility,Decreased range of motion,Impaired perceived functional ability,Improper body mechanics,Postural dysfunction,Impaired flexibility  Visit Diagnosis: Chronic bilateral low back pain, unspecified whether sciatica present  Pain in left leg  Muscle weakness (generalized)  Difficulty in walking, not elsewhere classified     Problem List Patient Active Problem List   Diagnosis Date Noted  . Port catheter in place 10/09/2016  . Malignant neoplasm of upper-outer quadrant of left female breast ([place] 08/23/2016  . Anxiety and depression 04/20/2016  . Arthritis 06/27/2015  . Essential hypertension 04/19/2009  . Fibromyalgia 02/18/2008  . MITRAL VALVE PROLAPSE, HX OF 02/18/2008    MScot Jun PT, DPT, OCS, ATC 10/24/20  9:25 AM    [facility] [address] [place]  [address]Phone: [phone]  Fax:  [phone] Name: LTranika SchollerMRN: [id number] of Birth: 126-May-1962

## 2020-11-01 ENCOUNTER — Encounter: Payer: BC Managed Care – PPO | Admitting: Physical Therapy

## 2020-11-08 ENCOUNTER — Encounter: Payer: BC Managed Care – PPO | Admitting: Rehabilitative and Restorative Service Providers"

## 2020-11-15 ENCOUNTER — Encounter: Payer: BC Managed Care – PPO | Admitting: Rehabilitative and Restorative Service Providers"

## 2020-11-22 ENCOUNTER — Encounter: Payer: BC Managed Care – PPO | Admitting: Rehabilitative and Restorative Service Providers"

## 2021-01-10 ENCOUNTER — Other Ambulatory Visit: Payer: Self-pay | Admitting: Internal Medicine

## 2021-01-13 NOTE — Telephone Encounter (Signed)
Pt decided to stay with [facility]

## 2021-04-11 ENCOUNTER — Other Ambulatory Visit: Payer: Self-pay | Admitting: Family

## 2021-04-12 ENCOUNTER — Encounter: Payer: Self-pay | Admitting: Hematology and Oncology

## 2021-04-13 ENCOUNTER — Other Ambulatory Visit: Payer: Self-pay

## 2021-04-13 ENCOUNTER — Encounter: Payer: Self-pay | Admitting: Internal Medicine

## 2021-04-13 ENCOUNTER — Ambulatory Visit (INDEPENDENT_AMBULATORY_CARE_PROVIDER_SITE_OTHER): Payer: BC Managed Care – PPO | Admitting: Internal Medicine

## 2021-04-13 VITALS — BP 121/83 | HR 74 | Temp 97.8°F | Resp 17 | Ht 63.0 in | Wt 229.0 lb

## 2021-04-13 DIAGNOSIS — R7303 Prediabetes: Secondary | ICD-10-CM

## 2021-04-13 DIAGNOSIS — M797 Fibromyalgia: Secondary | ICD-10-CM

## 2021-04-13 DIAGNOSIS — E7800 Pure hypercholesterolemia, unspecified: Secondary | ICD-10-CM

## 2021-04-13 DIAGNOSIS — E66813 Obesity, class 3: Secondary | ICD-10-CM | POA: Insufficient documentation

## 2021-04-13 DIAGNOSIS — E785 Hyperlipidemia, unspecified: Secondary | ICD-10-CM | POA: Insufficient documentation

## 2021-04-13 DIAGNOSIS — F419 Anxiety disorder, unspecified: Secondary | ICD-10-CM

## 2021-04-13 DIAGNOSIS — M1990 Unspecified osteoarthritis, unspecified site: Secondary | ICD-10-CM

## 2021-04-13 DIAGNOSIS — E6609 Other obesity due to excess calories: Secondary | ICD-10-CM | POA: Insufficient documentation

## 2021-04-13 DIAGNOSIS — F32A Depression, unspecified: Secondary | ICD-10-CM

## 2021-04-13 DIAGNOSIS — I10 Essential (primary) hypertension: Secondary | ICD-10-CM | POA: Diagnosis not present

## 2021-04-13 DIAGNOSIS — Z853 Personal history of malignant neoplasm of breast: Secondary | ICD-10-CM

## 2021-04-13 MED ORDER — HYDROCHLOROTHIAZIDE 25 MG PO TABS: 25.0000 mg | ORAL_TABLET | Freq: Every day | ORAL | 3 refills | Status: DC

## 2021-04-13 NOTE — Assessment & Plan Note (Signed)
Encourage diet and exercise for weight loss 

## 2021-04-13 NOTE — Assessment & Plan Note (Signed)
Controlled on HCTZ, refilled today Reinforced DASH diet and exercise weight loss [facility]-Met today

## 2021-04-13 NOTE — Patient Instructions (Signed)
https://www.diabeteseducator.org/docs/default-source/living-with-diabetes/conquering-the-grocery-store-v1.pdf?[email or web address]">  Carbohydrate Counting for Diabetes Mellitus, Adult Carbohydrate counting is a method of keeping track of how many carbohydrates you eat. Eating carbohydrates naturally increases the amount of sugar (glucose) in the blood. Counting how many carbohydrates you eat improves your bloodglucose control, which helps you manage your diabetes. It is important to know how many carbohydrates you can safely have in each meal. This is different for every person. A dietitian can help you make a meal plan and calculate how many carbohydrates you should have at each meal andsnack. What foods contain carbohydrates? Carbohydrates are found in the following foods: Grains, such as breads and cereals. Dried beans and soy products. Starchy vegetables, such as potatoes, peas, and corn. Fruit and fruit juices. Milk and yogurt. Sweets and snack foods, such as cake, cookies, candy, chips, and soft drinks. How do I count carbohydrates in foods? There are two ways to count carbohydrates in food. You can read food labels or learn standard serving sizes of foods. You can use either of the methods or acombination of both. Using the Nutrition Facts label The Nutrition Facts list is included on the labels of almost all packaged foods and beverages in the [place] It includes: The serving size. Information about nutrients in each serving, including the grams (g) of carbohydrate per serving. To use the Nutrition Facts: Decide how many servings you will have. Multiply the number of servings by the number of carbohydrates per serving. The resulting number is the total amount of carbohydrates that you will be having. Learning the standard serving sizes of foods When you eat carbohydrate foods that are not packaged or do not include Nutrition Facts on the label, you need to measure the servings in order to count the  amount of carbohydrates. Measure the foods that you will eat with a food scale or measuring cup, if needed. Decide how many standard-size servings you will eat. Multiply the number of servings by 15. For foods that contain carbohydrates, one serving equals 15 g of carbohydrates. For example, if you eat 2 cups or 10 oz (300 g) of strawberries, you will have eaten 2 servings and 30 g of carbohydrates (2 servings x 15 g = 30 g). For foods that have more than one food mixed, such as soups and casseroles, you must count the carbohydrates in each food that is included. The following list contains standard serving sizes of common carbohydrate-rich foods. Each of these servings has about 15 g of carbohydrates: 1 slice of bread. 1 six-inch (15 cm) tortilla. ? cup or 2 oz (53 g) cooked rice or pasta.  cup or 3 oz (85 g) cooked or canned, drained and rinsed beans or lentils.  cup or 3 oz (85 g) starchy vegetable, such as peas, corn, or squash.  cup or 4 oz (120 g) hot cereal.  cup or 3 oz (85 g) boiled or mashed potatoes, or  or 3 oz (85 g) of a large baked potato.  cup or 4 fl oz (118 mL) fruit juice. 1 cup or 8 fl oz (237 mL) milk. 1 small or 4 oz (106 g) apple.  or 2 oz (63 g) of a medium banana. 1 cup or 5 oz (150 g) strawberries. 3 cups or 1 oz (24 g) popped popcorn. What is an example of carbohydrate counting? To calculate the number of carbohydrates in this sample meal, follow the stepsshown below. Sample meal 3 oz (85 g) chicken breast. ? cup or 4 oz (106 g) Nola rice.    cup or 3 oz (85 g) corn. 1 cup or 8 fl oz (237 mL) milk. 1 cup or 5 oz (150 g) strawberries with sugar-free whipped topping. Carbohydrate calculation Identify the foods that contain carbohydrates: Rice. Corn. Milk. Strawberries. Calculate how many servings you have of each food: 2 servings rice. 1 serving corn. 1 serving milk. 1 serving strawberries. Multiply each number of servings by 15 g: 2 servings  rice x 15 g = 30 g. 1 serving corn x 15 g = 15 g. 1 serving milk x 15 g = 15 g. 1 serving strawberries x 15 g = 15 g. Add together all of the amounts to find the total grams of carbohydrates eaten: 30 g + 15 g + 15 g + 15 g = 75 g of carbohydrates total. What are tips for following this plan? Shopping Develop a meal plan and then make a shopping list. Buy fresh and frozen vegetables, fresh and frozen fruit, dairy, eggs, beans, lentils, and whole grains. Look at food labels. Choose foods that have more fiber and less sugar. Avoid processed foods and foods with added sugars. Meal planning Aim to have the same amount of carbohydrates at each meal and for each snack time. Plan to have regular, balanced meals and snacks. Where to find more information American Diabetes Association: www.diabetes.org Centers for Disease Control and Prevention: www.cdc.gov Summary Carbohydrate counting is a method of keeping track of how many carbohydrates you eat. Eating carbohydrates naturally increases the amount of sugar (glucose) in the blood. Counting how many carbohydrates you eat improves your blood glucose control, which helps you manage your diabetes. A dietitian can help you make a meal plan and calculate how many carbohydrates you should have at each meal and snack. This information is not intended to replace advice given to you by your health care provider. Make sure you discuss any questions you have with your healthcare provider. Document Revised: 09/03/2019 Document Reviewed: 09/04/2019 Elsevier Patient Education  2021 Elsevier Inc.  

## 2021-04-13 NOTE — Assessment & Plan Note (Signed)
C-Met and lipid profile today Encouraged her to consume a low-fat diet 

## 2021-04-13 NOTE — Progress Notes (Signed)
Subjective:    Patient ID: Stacy [name], female    DOB: Oct 06, 1960, 60 y.o.   MRN: [id number]  HPI  Patient presents the clinic today for follow-up of chronic conditions.  Anxiety and Depression: Triggered by family stress.  She is not currently taking any medications for this.  She is not seeing a therapist.  She denies SI/HI.  OA/Fibromyalgia: Deteriorated lately, especially in her hands and her right hip.  She did experience a fall 01/2021.  She is status post right hip replacement.  She is taking ibuprofen as needed with some relief of symptoms.  HTN: Her BP today is 121/83.  She has been out of her HCTZ x3 days.  ECG from 01/2017 reviewed.  History of Left Breast Cancer: Status post excision, chemo and radiation.  She is getting her mammograms done yearly.  She no longer follows with oncology.  GERD: Triggered by spicy and greasy foods.  She is not currently taking any medications for this.  There is no upper GI on file.  HLD: Her last LDL was 123, triglycerides 113, 10/2019.  She is not currently taking any cholesterol-lowering medication.  She tries to consume a low-fat diet.  Prediabetes: Her last A1c was 5.7%, 10/2019.  She is not currently taking any oral diabetic medication.  She does not check her sugars.  Review of Systems     Past Medical History:  Diagnosis Date   Anxiety    Arthritis    Cancer ([place])    Complication of anesthesia    DIFFICULTY WAKING UP , LAST SURGERY NECK 2012 WAS FINE PER PT HERE AT [facility]   Depression    Fibromyalgia    Genetic testing 12/21/2016   Genetic testing reported out on 12/20/2016 through [facility] 46-gene Common Hereditary Cancers panel found no deleterious mutations. The following 46 genes were analyzed: APC, ATM, AXIN2, BARD1, BMPR1A, BRCA1, BRCA2, BRIP1, CDH1, CDKN2A, CHEK2, CTNNA1, DICER1, EPCAM, GREM1, KIT, MEN1, MLH1, MSH2, MSH3, MSH6, MUTYH, NBN, NF1, NTHL1, PALB2, PDGFRA, PMS2, POLD1, POLE, PTEN, RAD50, RAD51C, RAD51D, SDH    Headache    HX MIGRAINES     Heart murmur    History of radiation therapy 02/14/17-04/03/2017   left breast 60.4 Gy: 50.4 Gy delivered in 28 fractions and a boost of 10 Gy delivered in 5 fractions.   Hypertension    Malignant neoplasm of upper-outer quadrant of left female breast ([place]) 08/23/2016   Mitral valve disorder    MVP (mitral valve prolapse)    AS INFANT   Neck pain    Personal history of chemotherapy    Personal history of radiation therapy    Vision abnormalities     Current Outpatient Medications  Medication Sig Dispense Refill   hydrochlorothiazide (HYDRODIURIL) 25 MG tablet TAKE 1 TABLET (25 MG TOTAL) BY MOUTH DAILY. MUST SCHEDULE PHYSICAL EXAM 90 tablet 0   ibuprofen (ADVIL,MOTRIN) 200 MG tablet Take 400 mg by mouth every 6 (six) hours as needed for mild pain.     No current facility-administered medications for this visit.    Allergies  Allergen Reactions   Gabapentin Swelling    "makes me feel detached"   Naproxen Palpitations   Neulasta [Pegfilgrastim] Itching and Other (See Comments)    Patient stated,"reddish skin tone, body on fire, tingly tongue."   Erythromycin Nausea And Vomiting   Gabapentin Other (See Comments)    "Makes me feel out of my body"   Neulasta [Pegfilgrastim] Itching   Tramadol Other (See Comments)  headache   Erythromycin Nausea And Vomiting   Naproxen Palpitations   Tramadol Hcl Other (See Comments)    Headaches    Family History  Adopted: Yes  Problem Relation Age of Onset   Breast cancer Brother 64       Maternal half-brother. Currently 79.   BRCA 1/2 Neg Hx     Social History   Socioeconomic History   Marital status: Married    Spouse name: Not on file   Number of children: 4   Years of education: Not on file   Highest education level: Not on file  Occupational History   Not on file  Tobacco Use   Smoking status: Never   Smokeless tobacco: Never  Vaping Use   Vaping Use: Never used  Substance and Sexual  Activity   Alcohol use: Not Currently   Drug use: Not Currently   Sexual activity: Yes    Birth control/protection: Post-menopausal  Other Topics Concern   Not on file  Social History Narrative   ** Merged History Encounter **       Social Determinants of Health   Financial Resource Strain: Not on file  Food Insecurity: Not on file  Transportation Needs: Not on file  Physical Activity: Not on file  Stress: Not on file  Social Connections: Not on file  Intimate Partner Violence: Not on file     Constitutional: Denies fever, malaise, fatigue, headache or abrupt weight changes.  HEENT: Denies eye pain, eye redness, ear pain, ringing in the ears, wax buildup, runny nose, nasal congestion, bloody nose, or sore throat. Respiratory: Denies difficulty breathing, shortness of breath, cough or sputum production.   Cardiovascular: Denies chest pain, chest tightness, palpitations or swelling in the hands or feet.  Gastrointestinal: Denies abdominal pain, bloating, constipation, diarrhea or blood in the stool.  GU: Denies urgency, frequency, pain with urination, burning sensation, blood in urine, odor or discharge. Musculoskeletal: Patient reports chronic joint and muscle pain.  Denies decrease in range of motion, difficulty with gait, or joint swelling.  Skin: Denies redness, rashes, lesions or ulcercations.  Neurological: Denies dizziness, difficulty with memory, difficulty with speech or problems with balance and coordination.  Psych: Patient has a history of anxiety and depression.  Denies  SI/HI.  No other specific complaints in a complete review of systems (except as listed in HPI above).  Objective:   Physical Exam   BP 121/83 (BP Location: Right Arm, Patient Position: Sitting, Cuff Size: Large)   Pulse 74   Temp 97.8 F (36.6 C) (Temporal)   Resp 17   Ht 5' 3"  (1.6 m)   Wt 229 lb (103.9 kg)   LMP 11/07/2016   SpO2 98%   BMI 40.57 kg/m  Wt Readings from Last 3 Encounters:   04/13/21 229 lb (103.9 kg)  09/06/20 222 lb 9.6 oz (101 kg)  11/13/19 225 lb (102.1 kg)    General: Appears her stated age, obese, in NAD. Skin: Warm, dry and intact.  HEENT: Head: normal shape and size; Eyes: sclera white and EOMs intact; Neck:  Neck supple, trachea midline. No masses, lumps or thyromegaly present.  Cardiovascular: Normal rate and rhythm. S1,S2 noted.  No murmur, rubs or gallops noted. No JVD or BLE edema. No carotid bruits noted. Pulmonary/Chest: Normal effort and positive vesicular breath sounds. No respiratory distress. No wheezes, rales or ronchi noted.  Musculoskeletal: No difficulty with gait.  Neurological: Alert and oriented.  Psychiatric: Mood and affect normal.  Tearful.  Judgment  and thought content normal.     BMET    Component Value Date/Time   NA 138 11/13/2019 1445   NA 138 09/11/2017 0902   K 4.3 11/13/2019 1445   K 3.5 09/11/2017 0902   CL 102 11/13/2019 1445   CO2 26 11/13/2019 1445   CO2 25 09/11/2017 0902   GLUCOSE 101 (H) 11/13/2019 1445   GLUCOSE 98 09/11/2017 0902   BUN 20 11/13/2019 1445   BUN 18.2 09/11/2017 0902   CREATININE 0.99 11/13/2019 1445   CREATININE 1.0 09/11/2017 0902   CALCIUM 10.0 11/13/2019 1445   CALCIUM 9.4 09/11/2017 0902   GFRNONAA >60 11/01/2018 1657   GFRAA >60 11/01/2018 1657    Lipid Panel     Component Value Date/Time   CHOL 203 (H) 11/13/2019 1445   TRIG 113 11/13/2019 1445   HDL 58 11/13/2019 1445   CHOLHDL 3.5 11/13/2019 1445   VLDL 23.4 11/07/2018 1432   LDLCALC 123 (H) 11/13/2019 1445    CBC    Component Value Date/Time   WBC 11.6 (H) 11/13/2019 1445   RBC 4.96 11/13/2019 1445   HGB 15.1 11/13/2019 1445   HGB 13.0 09/11/2017 0902   HCT 43.7 11/13/2019 1445   HCT 38.6 09/11/2017 0902   PLT 317 11/13/2019 1445   PLT 263 09/11/2017 0902   MCV 88.1 11/13/2019 1445   MCV 88.7 09/11/2017 0902   MCH 30.4 11/13/2019 1445   MCHC 34.6 11/13/2019 1445   RDW 12.9 11/13/2019 1445   RDW 13.4  09/11/2017 0902   LYMPHSABS 2.8 11/01/2018 1657   LYMPHSABS 1.4 09/11/2017 0902   MONOABS 1.3 (H) 11/01/2018 1657   MONOABS 0.8 09/11/2017 0902   EOSABS 0.1 11/01/2018 1657   EOSABS 0.2 09/11/2017 0902   BASOSABS 0.1 11/01/2018 1657   BASOSABS 0.0 09/11/2017 0902    Hgb A1C Lab Results  Component Value Date   HGBA1C 5.7 (H) 11/13/2019           Assessment & Plan:   Webb Silversmith, NP This visit occurred during the SARS-CoV-2 public health emergency.  Safety protocols were in place, including screening questions prior to the visit, additional usage of staff PPE, and extensive cleaning of exam room while observing appropriate contact time as indicated for disinfecting solutions.

## 2021-04-13 NOTE — Assessment & Plan Note (Signed)
Encourage regular stretching and strength training Continue Ibuprofen OTC as needed

## 2021-04-13 NOTE — Assessment & Plan Note (Signed)
She will continue to get yearly mammograms

## 2021-04-13 NOTE — Assessment & Plan Note (Signed)
A1c today Encouraged her to consume a low-carb diet 

## 2021-04-13 NOTE — Assessment & Plan Note (Signed)
Support offered Will monitor

## 2021-04-13 NOTE — Assessment & Plan Note (Signed)
Continue Ibuprofen OTC as needed C-Met today 

## 2021-04-14 LAB — COMPLETE METABOLIC PANEL WITH GFR
AG Ratio: 1.7 (calc) (ref 1.0–2.5)
ALT: 12 U/L (ref 6–29)
AST: 13 U/L (ref 10–35)
Albumin: 4 g/dL (ref 3.6–5.1)
Alkaline phosphatase (APISO): 57 U/L (ref 37–153)
BUN: 17 mg/dL (ref 7–25)
CO2: 26 mmol/L (ref 20–32)
Calcium: 9.2 mg/dL (ref 8.6–10.4)
Chloride: 107 mmol/L (ref 98–110)
Creat: 0.78 mg/dL (ref 0.50–1.03)
Globulin: 2.4 g/dL (calc) (ref 1.9–3.7)
Glucose, Bld: 97 mg/dL (ref 65–99)
Potassium: 4.6 mmol/L (ref 3.5–5.3)
Sodium: 140 mmol/L (ref 135–146)
Total Bilirubin: 0.4 mg/dL (ref 0.2–1.2)
Total Protein: 6.4 g/dL (ref 6.1–8.1)
eGFR: 89 mL/min/{1.73_m2} (ref >=60)

## 2021-04-14 LAB — LIPID PANEL
Cholesterol: 190 mg/dL (ref <200)
HDL: 54 mg/dL (ref >=50)
LDL Cholesterol (Calc): 112 mg/dL (calc) — ABNORMAL HIGH
Non-HDL Cholesterol (Calc): 136 mg/dL (calc) — ABNORMAL HIGH (ref <130)
Total CHOL/HDL Ratio: 3.5 (calc) (ref <5.0)
Triglycerides: 129 mg/dL (ref <150)

## 2021-04-14 LAB — HEMOGLOBIN A1C
Hgb A1c MFr Bld: 5.7 % of total Hgb — ABNORMAL HIGH (ref <5.7)
Mean Plasma Glucose: 117 mg/dL
eAG (mmol/L): 6.5 mmol/L

## 2021-05-17 ENCOUNTER — Encounter: Payer: Self-pay | Admitting: Internal Medicine

## 2021-06-13 ENCOUNTER — Other Ambulatory Visit: Payer: Self-pay | Admitting: Hematology and Oncology

## 2021-06-13 DIAGNOSIS — R928 Other abnormal and inconclusive findings on diagnostic imaging of breast: Secondary | ICD-10-CM

## 2021-06-20 ENCOUNTER — Ambulatory Visit: Payer: Self-pay | Admitting: Internal Medicine

## 2021-07-28 ENCOUNTER — Ambulatory Visit: Payer: BC Managed Care – PPO | Admitting: Internal Medicine

## 2021-08-07 ENCOUNTER — Other Ambulatory Visit: Payer: Self-pay | Admitting: Hematology and Oncology

## 2021-08-07 DIAGNOSIS — Z9889 Other specified postprocedural states: Secondary | ICD-10-CM

## 2021-08-15 ENCOUNTER — Encounter: Payer: Self-pay | Admitting: Hematology and Oncology

## 2021-08-15 ENCOUNTER — Ambulatory Visit
Admission: RE | Admit: 2021-08-15 | Discharge: 2021-08-15 | Disposition: A | Discharge status: Home / Self Care | Payer: BC Managed Care – PPO | Source: Ambulatory Visit | Attending: Hematology and Oncology | Admitting: Hematology and Oncology

## 2021-08-15 DIAGNOSIS — Z9889 Other specified postprocedural states: Secondary | ICD-10-CM

## 2021-08-15 DIAGNOSIS — R922 Inconclusive mammogram: Secondary | ICD-10-CM | POA: Diagnosis not present

## 2021-11-17 ENCOUNTER — Encounter: Payer: Self-pay | Admitting: Internal Medicine

## 2021-12-18 ENCOUNTER — Ambulatory Visit: Payer: BC Managed Care – PPO | Admitting: Internal Medicine

## 2021-12-18 ENCOUNTER — Encounter: Payer: Self-pay | Admitting: Internal Medicine

## 2021-12-18 ENCOUNTER — Other Ambulatory Visit: Payer: Self-pay | Admitting: Internal Medicine

## 2021-12-18 ENCOUNTER — Encounter: Payer: Self-pay | Admitting: Hematology and Oncology

## 2021-12-18 VITALS — BP 122/86 | HR 86 | Temp 97.1°F | Wt 227.0 lb

## 2021-12-18 DIAGNOSIS — M5441 Lumbago with sciatica, right side: Secondary | ICD-10-CM | POA: Diagnosis not present

## 2021-12-18 DIAGNOSIS — M5442 Lumbago with sciatica, left side: Secondary | ICD-10-CM

## 2021-12-18 DIAGNOSIS — Z1211 Encounter for screening for malignant neoplasm of colon: Secondary | ICD-10-CM | POA: Diagnosis not present

## 2021-12-18 DIAGNOSIS — G8929 Other chronic pain: Secondary | ICD-10-CM | POA: Diagnosis not present

## 2021-12-18 MED ORDER — PREDNISONE 10 MG PO TABS: ORAL_TABLET | ORAL | 0 refills | Status: DC

## 2021-12-18 MED ORDER — HYDROCODONE-ACETAMINOPHEN 5-325 MG PO TABS: 1.0000 | ORAL_TABLET | Freq: Three times a day (TID) | ORAL | 0 refills | Status: AC | PRN

## 2021-12-18 MED ORDER — DIAZEPAM 5 MG PO TABS: ORAL_TABLET | ORAL | 0 refills | Status: DC

## 2021-12-18 NOTE — Patient Instructions (Signed)
Sciatica Rehab °Ask your health care provider which exercises are safe for you. Do exercises exactly as told by your health care provider and adjust them as directed. It is normal to feel mild stretching, pulling, tightness, or discomfort as you do these exercises. Stop right away if you feel sudden pain or your pain gets worse. Do not begin these exercises until told by your health care provider. °Stretching and range-of-motion exercises °These exercises warm up your muscles and joints and improve the movement and flexibility of your hips and back. These exercises also help to relieve pain, numbness, and tingling. °Sciatic nerve glide °Sit in a chair with your head facing down toward your chest. Place your hands behind your back. Let your shoulders slump forward. °Slowly straighten one of your legs while you tilt your head back as if you are looking toward the ceiling. Only straighten your leg as far as you can without making your symptoms worse. °Hold this position for __________ seconds. °Slowly return to the starting position. °Repeat with your other leg. °Repeat __________ times. Complete this exercise __________ times a day. °Knee to chest with hip adduction and internal rotation ° °Lie on your back on a firm surface with both legs straight. °Bend one of your knees and move it up toward your chest until you feel a gentle stretch in your lower back and buttock. Then, move your knee toward the shoulder that is on the opposite side from your leg. This is hip adduction and internal rotation. °Hold your leg in this position by holding on to the front of your knee. °Hold this position for __________ seconds. °Slowly return to the starting position. °Repeat with your other leg. °Repeat __________ times. Complete this exercise __________ times a day. °Prone extension on elbows ° °Lie on your abdomen on a firm surface. A bed may be too soft for this exercise. °Prop yourself up on your elbows. °Use your arms to help  lift your chest up until you feel a gentle stretch in your abdomen and your lower back. °This will place some of your body weight on your elbows. If this is uncomfortable, try stacking pillows under your chest. °Your hips should stay down, against the surface that you are lying on. Keep your hip and back muscles relaxed. °Hold this position for __________ seconds. °Slowly relax your upper body and return to the starting position. °Repeat __________ times. Complete this exercise __________ times a day. °Strengthening exercises °These exercises build strength and endurance in your back. Endurance is the ability to use your muscles for a long time, even after they get tired. °Pelvic tilt °This exercise strengthens the muscles that lie deep in the abdomen. °Lie on your back on a firm surface. Bend your knees and keep your feet flat on the floor. °Tense your abdominal muscles. Tip your pelvis up toward the ceiling and flatten your lower back into the floor. °To help with this exercise, you may place a small towel under your lower back and try to push your back into the towel. °Hold this position for __________ seconds. °Let your muscles relax completely before you repeat this exercise. °Repeat __________ times. Complete this exercise __________ times a day. °Alternating arm and leg raises ° °Get on your hands and knees on a firm surface. If you are on a hard floor, you may want to use padding, such as an exercise mat, to cushion your knees. °Line up your arms and legs. Your hands should be directly below your shoulders,   and your knees should be directly below your hips. °Lift your left leg behind you. At the same time, raise your right arm and straighten it in front of you. °Do not lift your leg higher than your hip. °Do not lift your arm higher than your shoulder. °Keep your abdominal and back muscles tight. °Keep your hips facing the ground. °Do not arch your back. °Keep your balance carefully, and do not hold your  breath. °Hold this position for __________ seconds. °Slowly return to the starting position. °Repeat with your right leg and your left arm. °Repeat __________ times. Complete this exercise __________ times a day. °Posture and body mechanics °Good posture and healthy body mechanics can help to relieve stress in your body's tissues and joints. Body mechanics refers to the movements and positions of your body while you do your daily activities. Posture is part of body mechanics. Good posture means: °Your spine is in its natural S-curve position (neutral). °Your shoulders are pulled back slightly. °Your head is not tipped forward. °Follow these guidelines to improve your posture and body mechanics in your everyday activities. °Standing ° °When standing, keep your spine neutral and your feet about hip width apart. Keep a slight bend in your knees. Your ears, shoulders, and hips should line up. °When you do a task in which you stand in one place for a long time, place one foot up on a stable object that is 2-4 inches (5-10 cm) high, such as a footstool. This helps keep your spine neutral. °Sitting ° °When sitting, keep your spine neutral and keep your feet flat on the floor. Use a footrest, if necessary, and keep your thighs parallel to the floor. Avoid rounding your shoulders, and avoid tilting your head forward. °When working at a desk or a computer, keep your desk at a height where your hands are slightly lower than your elbows. Slide your chair under your desk so you are close enough to maintain good posture. °When working at a computer, place your monitor at a height where you are looking straight ahead and you do not have to tilt your head forward or downward to look at the screen. °Resting °When lying down and resting, avoid positions that are most painful for you. °If you have pain with activities such as sitting, bending, stooping, or squatting, lie in a position in which your body does not bend very much. For  example, avoid curling up on your side with your arms and knees near your chest (fetal position). °If you have pain with activities such as standing for a long time or reaching with your arms, lie with your spine in a neutral position and bend your knees slightly. Try the following positions: °Lying on your side with a pillow between your knees. °Lying on your back with a pillow under your knees. °Lifting ° °When lifting objects, keep your feet at least shoulder width apart and tighten your abdominal muscles. °Bend your knees and hips and keep your spine neutral. It is important to lift using the strength of your legs, not your back. Do not lock your knees straight out. °Always ask for help to lift heavy or awkward objects. °This information is not intended to replace advice given to you by your health care provider. Make sure you discuss any questions you have with your health care provider. °Document Revised: 12/26/2018 Document Reviewed: 09/25/2018 °Elsevier Patient Education © 2022 [employer]. ° °

## 2021-12-18 NOTE — Progress Notes (Signed)
? ?Subjective:  ? ? Patient ID: Stacy [name], female    DOB: 1960/12/19, 61 y.o.   MRN: [id number] ? ?HPI ? ?Patient with a history of OA, fibromyalgia presents to the clinic today with complaint of low back pain.  This started 1 year ago after a fall down the stairs but this has worsened in the last 6 months.  She describes the pain as achy and throbbing but can be stabbing and shooting. The pain is worse with sitting, standing and walking for long periods of times. The pain radiates down her legs but she denies numbness, tingling or weakness. She is having urinary incontinence (not full incontinence) but denies bowel incontinence.  X-ray lumbar spine from 09/2020 showed: ? ?Mild scoliosis.  Multilevel spondylosis with anterior spondylolisthesis at L5.  No acute findings. ? ?She has tried Ibuprofen and muscle rubs OTC with minimal relief of symptoms.  She underwent PT after the initial accident with minimal relief of symptoms.  She has continued to do the stretching exercises without any relief. ? ?Review of Systems ? ?   ?Past Medical History:  ?Diagnosis Date  ? Anxiety   ? Arthritis   ? Cancer [facility])   ? Complication of anesthesia   ? DIFFICULTY WAKING UP , LAST SURGERY NECK 2012 WAS FINE PER PT HERE AT [facility]  ? Depression   ? Fibromyalgia   ? Genetic testing 12/21/2016  ? Genetic testing reported out on 12/20/2016 through [facility]'s 46-gene Common Hereditary Cancers panel found no deleterious mutations. The following 46 genes were analyzed: APC, ATM, AXIN2, BARD1, BMPR1A, BRCA1, BRCA2, BRIP1, CDH1, CDKN2A, CHEK2, CTNNA1, DICER1, EPCAM, GREM1, KIT, MEN1, MLH1, MSH2, MSH3, MSH6, MUTYH, NBN, NF1, NTHL1, PALB2, PDGFRA, PMS2, POLD1, POLE, PTEN, RAD50, RAD51C, RAD51D, SDH  ? Headache   ? HX MIGRAINES    ? Heart murmur   ? History of radiation therapy 02/14/17-04/03/2017  ? left breast 60.4 Gy: 50.4 Gy delivered in 28 fractions and a boost of 10 Gy delivered in 5 fractions.  ? Hypertension   ? Malignant neoplasm of  upper-outer quadrant of left female breast ([place]) 08/23/2016  ? Mitral valve disorder   ? MVP (mitral valve prolapse)   ? AS INFANT  ? Neck pain   ? Personal history of chemotherapy   ? Personal history of radiation therapy   ? Vision abnormalities   ? ? ?Current Outpatient Medications  ?Medication Sig Dispense Refill  ? hydrochlorothiazide (HYDRODIURIL) 25 MG tablet Take 1 tablet (25 mg total) by mouth daily. 90 tablet 3  ? ibuprofen (ADVIL,MOTRIN) 200 MG tablet Take 400 mg by mouth every 6 (six) hours as needed for mild pain.    ? ?No current facility-administered medications for this visit.  ? ? ?Allergies  ?Allergen Reactions  ? Gabapentin Swelling  ?  "makes me feel detached"  ? Naproxen Palpitations  ? Neulasta [Pegfilgrastim] Itching and Other (See Comments)  ?  Patient stated,"reddish skin tone, body on fire, tingly tongue."  ? Erythromycin Nausea And Vomiting  ? Gabapentin Other (See Comments)  ?  "Makes me feel out of my body"  ? Neulasta [Pegfilgrastim] Itching  ? Tramadol Other (See Comments)  ?  headache  ? Erythromycin Nausea And Vomiting  ? Naproxen Palpitations  ? Tramadol Hcl Other (See Comments)  ?  Headaches  ? ? ?Family History  ?Adopted: Yes  ?Problem Relation Age of Onset  ? Breast cancer Brother 67  ?     Maternal half-brother. Currently 58.  ?  BRCA 1/2 Neg Hx   ? ? ?Social History  ? ?Socioeconomic History  ? Marital status: Married  ?  Spouse name: Not on file  ? Number of children: 4  ? Years of education: Not on file  ? Highest education level: Not on file  ?Occupational History  ? Not on file  ?Tobacco Use  ? Smoking status: Never  ? Smokeless tobacco: Never  ?Vaping Use  ? Vaping Use: Never used  ?Substance and Sexual Activity  ? Alcohol use: Not Currently  ? Drug use: Not Currently  ? Sexual activity: Yes  ?  Birth control/protection: Post-menopausal  ?Other Topics Concern  ? Not on file  ?Social History Narrative  ? ** Merged History Encounter **  ?    ? ?Social Determinants of Health   ? ?Financial Resource Strain: Not on file  ?Food Insecurity: Not on file  ?Transportation Needs: Not on file  ?Physical Activity: Not on file  ?Stress: Not on file  ?Social Connections: Not on file  ?Intimate Partner Violence: Not on file  ? ? ? ?Constitutional: Denies fever, malaise, fatigue, headache or abrupt weight changes.  ?Respiratory: Denies difficulty breathing, shortness of breath, cough or sputum production.   ?Cardiovascular: Denies chest pain, chest tightness, palpitations or swelling in the hands or feet.  ?Gastrointestinal: Denies abdominal pain, bloating, constipation, diarrhea or blood in the stool.  ?GU: Patient reports urinary incontinence.  Denies urgency, frequency, pain with urination, burning sensation, blood in urine, odor or discharge. ?Musculoskeletal: Patient reports low back pain, decrease in range of motion.  Denies difficulty with gait, or joint swelling.  ? ?Neurological: Denies numbness, tingling, weakness or problems with balance and coordination.  ? ? ?No other specific complaints in a complete review of systems (except as listed in HPI above). ? ?Objective:  ? Physical Exam ? ?BP 122/86 (BP Location: Right Arm, Patient Position: Sitting, Cuff Size: Large)   Pulse 86   Temp (!) 97.1 ?F (36.2 ?C) (Temporal)   Wt 227 lb (103 kg)   LMP 11/07/2016   SpO2 98%   BMI 40.21 kg/m?  ? ?Wt Readings from Last 3 Encounters:  ?04/13/21 229 lb (103.9 kg)  ?09/06/20 222 lb 9.6 oz (101 kg)  ?11/13/19 225 lb (102.1 kg)  ? ? ?General: Appears her stated age, obese, in NAD. ?Skin: Warm, dry and intact.  ?Cardiovascular: Normal rate and rhythm. S1,S2 noted.  No murmur, rubs or gallops noted.  ?Pulmonary/Chest: Normal effort and positive vesicular breath sounds. No respiratory distress. No wheezes, rales or ronchi noted.  ?Musculoskeletal: Decreased flexion, extension, rotation and lateral bending to the right.  Normal rotation lateral bending to the left.  Pain with palpation over the lumbar  spine.  Strength 5/5 RLE.  Strength 4/5 LLE.  Able to stand on tiptoes but has difficulty standing on heels.  Difficulty getting from a sitting to a standing position.  Gait slow and steady without device. ?Neurological: Alert and oriented.  Positive SLR on the left at 45 degrees. ? ? ?BMET ?   ?Component Value Date/Time  ? NA 140 04/13/2021 0918  ? NA 138 09/11/2017 0902  ? K 4.6 04/13/2021 0918  ? K 3.5 09/11/2017 0902  ? CL 107 04/13/2021 0918  ? CO2 26 04/13/2021 0918  ? CO2 25 09/11/2017 0902  ? GLUCOSE 97 04/13/2021 0918  ? GLUCOSE 98 09/11/2017 0902  ? BUN 17 04/13/2021 0918  ? BUN 18.2 09/11/2017 0902  ? CREATININE 0.78 04/13/2021 0918  ?  CREATININE 1.0 09/11/2017 0902  ? CALCIUM 9.2 04/13/2021 0918  ? CALCIUM 9.4 09/11/2017 0902  ? GFRNONAA >60 11/01/2018 1657  ? GFRAA >60 11/01/2018 1657  ? ? ?Lipid Panel  ?   ?Component Value Date/Time  ? CHOL 190 04/13/2021 0918  ? TRIG 129 04/13/2021 0918  ? HDL 54 04/13/2021 0918  ? CHOLHDL 3.5 04/13/2021 0918  ? VLDL 23.4 11/07/2018 1432  ? LDLCALC 112 (H) 04/13/2021 1884  ? ? ?CBC ?   ?Component Value Date/Time  ? WBC 11.6 (H) 11/13/2019 1445  ? RBC 4.96 11/13/2019 1445  ? HGB 15.1 11/13/2019 1445  ? HGB 13.0 09/11/2017 0902  ? HCT 43.7 11/13/2019 1445  ? HCT 38.6 09/11/2017 0902  ? PLT 317 11/13/2019 1445  ? PLT 263 09/11/2017 0902  ? MCV 88.1 11/13/2019 1445  ? MCV 88.7 09/11/2017 0902  ? MCH 30.4 11/13/2019 1445  ? MCHC 34.6 11/13/2019 1445  ? RDW 12.9 11/13/2019 1445  ? RDW 13.4 09/11/2017 0902  ? LYMPHSABS 2.8 11/01/2018 1657  ? LYMPHSABS 1.4 09/11/2017 0902  ? MONOABS 1.3 (H) 11/01/2018 1657  ? MONOABS 0.8 09/11/2017 0902  ? EOSABS 0.1 11/01/2018 1657  ? EOSABS 0.2 09/11/2017 0902  ? BASOSABS 0.1 11/01/2018 1657  ? BASOSABS 0.0 09/11/2017 0902  ? ? ?Hgb A1C ?Lab Results  ?Component Value Date  ? HGBA1C 5.7 (H) 04/13/2021  ? ? ? ? ? ? ? ?   ?Assessment & Plan:  ? ?Chronic Low Back Pain with Sciatica: ? ?MRI lumbar spine ordered for further evaluation ?Rx for  Pred taper x9 days ?She declines Rx for muscle relaxer at this time ?Rx for Hydrocodone-Acetaminophen 5-325 mg every 8 hours as needed-addiction caution given ?Encouraged stretching and ice as needed ?Discussed chiropr

## 2021-12-18 NOTE — Assessment & Plan Note (Signed)
Encourage weight loss as this can help reduce back pain 

## 2021-12-21 ENCOUNTER — Encounter: Payer: Self-pay | Admitting: Internal Medicine

## 2021-12-22 ENCOUNTER — Ambulatory Visit
Admission: RE | Admit: 2021-12-22 | Discharge: 2021-12-22 | Disposition: A | Discharge status: Home / Self Care | Payer: BC Managed Care – PPO | Source: Ambulatory Visit | Attending: Internal Medicine | Admitting: Internal Medicine

## 2021-12-22 DIAGNOSIS — M545 Low back pain, unspecified: Secondary | ICD-10-CM | POA: Diagnosis not present

## 2021-12-22 DIAGNOSIS — G8929 Other chronic pain: Secondary | ICD-10-CM

## 2021-12-23 LAB — COLOGUARD: COLOGUARD: NEGATIVE

## 2021-12-25 ENCOUNTER — Encounter: Payer: Self-pay | Admitting: Internal Medicine

## 2021-12-25 DIAGNOSIS — G8929 Other chronic pain: Secondary | ICD-10-CM

## 2021-12-28 MED ORDER — HYDROCODONE-ACETAMINOPHEN 5-325 MG PO TABS: 1.0000 | ORAL_TABLET | Freq: Three times a day (TID) | ORAL | 0 refills | Status: DC | PRN

## 2021-12-28 NOTE — Addendum Note (Signed)
Addended by: [name] on: 12/28/2021 06:55 AM ? ? Modules accepted: Orders ? ?

## 2022-01-04 DIAGNOSIS — M5416 Radiculopathy, lumbar region: Secondary | ICD-10-CM | POA: Diagnosis not present

## 2022-01-04 DIAGNOSIS — E669 Obesity, unspecified: Secondary | ICD-10-CM | POA: Diagnosis not present

## 2022-01-04 DIAGNOSIS — M5430 Sciatica, unspecified side: Secondary | ICD-10-CM | POA: Diagnosis not present

## 2022-01-04 DIAGNOSIS — M47816 Spondylosis without myelopathy or radiculopathy, lumbar region: Secondary | ICD-10-CM | POA: Diagnosis not present

## 2022-01-04 DIAGNOSIS — M5442 Lumbago with sciatica, left side: Secondary | ICD-10-CM | POA: Diagnosis not present

## 2022-01-04 DIAGNOSIS — M4126 Other idiopathic scoliosis, lumbar region: Secondary | ICD-10-CM | POA: Diagnosis not present

## 2022-01-04 DIAGNOSIS — M5441 Lumbago with sciatica, right side: Secondary | ICD-10-CM | POA: Diagnosis not present

## 2022-01-04 DIAGNOSIS — M4316 Spondylolisthesis, lumbar region: Secondary | ICD-10-CM | POA: Diagnosis not present

## 2022-01-30 DIAGNOSIS — M48062 Spinal stenosis, lumbar region with neurogenic claudication: Secondary | ICD-10-CM | POA: Diagnosis not present

## 2022-01-30 DIAGNOSIS — M5416 Radiculopathy, lumbar region: Secondary | ICD-10-CM | POA: Diagnosis not present

## 2022-03-24 ENCOUNTER — Other Ambulatory Visit: Payer: Self-pay | Admitting: Internal Medicine

## 2022-03-24 DIAGNOSIS — I10 Essential (primary) hypertension: Secondary | ICD-10-CM

## 2022-03-26 ENCOUNTER — Encounter: Payer: BC Managed Care – PPO | Attending: Internal Medicine | Admitting: Dietician

## 2022-03-26 ENCOUNTER — Encounter: Payer: Self-pay | Admitting: Dietician

## 2022-03-26 VITALS — Ht 64.0 in | Wt 229.1 lb

## 2022-03-26 DIAGNOSIS — E669 Obesity, unspecified: Secondary | ICD-10-CM

## 2022-03-26 DIAGNOSIS — I10 Essential (primary) hypertension: Secondary | ICD-10-CM

## 2022-03-26 DIAGNOSIS — M797 Fibromyalgia: Secondary | ICD-10-CM | POA: Diagnosis not present

## 2022-03-26 DIAGNOSIS — Z6839 Body mass index (BMI) 39.0-39.9, adult: Secondary | ICD-10-CM | POA: Diagnosis not present

## 2022-03-26 NOTE — Patient Instructions (Addendum)
Plan to have something to eat every 3-5 hours during the day. Allow at least 2-3 hours between meals and snacks.  Gradually increase exercise and/or daily movement. Start with short duration, maybe 10 minutes, and increase the time by a few minutes every few days/ weeks.  Include protein with all meals to help with appetite control.  Control portions of starchy foods, ideally to 1 cup or less at any one meal. Include generous portions of low carb veggies to feel full enough after a meal.

## 2022-03-26 NOTE — Progress Notes (Signed)
Medical Nutrition Therapy: Visit start time: 1287  end time: 1630  Assessment:   Referral Diagnosis: obesity Other medical history/ diagnoses: hypertension, IBS Psychosocial issues/ stress concerns: none  Medications, supplements: reconciled list in medical record   Preferred learning method:  No preference indicated   Current weight: 229.1lbs Height: 5'4" BMI: 39.32 Patient's personal weight goal: 170lbs  Progress and evaluation:  Patient reports 9lb increase in past 2 months. Reports 198 when starting cancer treatments 2018; gained weight from chemo treatments.   No previous diets.  Keeps 36 yo granddaughter every other week, also 28 yo grandson often Reports frequent emotional eating. History of hip replacement Food allergies: dairy intolerance esp milk, ice cream, yogurt; intolerance to beans, collard greens, popcorn (IBS); does not eat eggs, fish; does not like to eat meats often Special diet practices: no Patient seeks help with weight loss of at least 25lbs in order to have back surgery.     Dietary Intake:  Usual eating pattern includes 3 meals and 2-3 snacks per day. Dining out frequency: 6 meals per week. Who plans meals/ buys groceries? self Who prepares meals? self  Breakfast: sometimes whole wheat toast; leftovers from dinner; grits Snack: none or same as pm Lunch: quick, easy meal ie salads Snack: sometimes crackers; chips; bread; carrots Supper: usu out or takeout  pasta, pizza, potatoes; grilledd chicken salad; potato skins, rolls, salad Snack: chips Beverages: water, coffee with half n half, occ half sweet tea. No sodas since chemo  Physical activity: play with grandchildren   Intervention:   Nutrition Care Education:   Basic nutrition: basic food groups; appropriate nutrient balance; appropriate meal and snack schedule; general nutrition guidelines    Weight control: importance of low sugar and low fat choices; portion control; estimated energy needs  for weight loss at 1300-1400 kcal, provided guidance for 50% CHO, 20% protein, 30% fat; controlling snacks/ mindless eating; balanced meals to help with appetite control, effects of high carb diet on insulin levels and hunger/ appetite; role of physical activity, gradual increase in duration and intensity to control chronic pain Hypertension:  identifying high sodium foods, food sources of potassium, magnesium IBS: High and Low fodmap foods   Other intervention notes: Patient's intolerance to/ dislike of several protein foods limits protein options; advised inclusion of protein source with each meal.  Encouraged structured eating pattern to control frequent snacks Listed specific goals for change, determined with direction from patient.     Nutritional Diagnosis:  [place]-1.4 Altered GI function As related to IBS, food intolerance.  As evidenced by patient reported symptoms. [place]-3.3 Overweight/obesity As related to excess calories and inadequate physical activity.  As evidenced by patient with current BMI of 39.32.   Education Materials given:  Museum/gallery conservator with food lists, sample meal pattern Sample menus Fodmap food list handout Visit summary with goals/ instructions   Learner/ who was taught:  Patient    Level of understanding: Verbalizes/ demonstrates competency  Demonstrated degree of understanding via:   Teach back Learning barriers: None  Willingness to learn/ readiness for change: Acceptance, ready for change   Monitoring and Evaluation:  Dietary intake, exercise, BP control, and body weight      follow up:  05/07/22 at 4:30pm

## 2022-03-26 NOTE — Telephone Encounter (Signed)
Requested Prescriptions  Pending Prescriptions Disp Refills  . hydrochlorothiazide (HYDRODIURIL) 25 MG tablet [Pharmacy Med Name: HYDROCHLOROTHIAZIDE 25 MG TAB] 90 tablet 0    Sig: TAKE 1 TABLET (25 MG TOTAL) BY MOUTH DAILY.     Cardiovascular: Diuretics - Thiazide Failed - 03/24/2022 11:09 AM      Failed - Cr in normal range and within 180 days    Creatinine  Date Value Ref Range Status  09/11/2017 1.0 0.6 - 1.1 mg/dL Final   Creat  Date Value Ref Range Status  04/13/2021 0.78 0.50 - 1.03 mg/dL Final         Failed - K in normal range and within 180 days    Potassium  Date Value Ref Range Status  04/13/2021 4.6 3.5 - 5.3 mmol/L Final  09/11/2017 3.5 3.5 - 5.1 mEq/L Final         Failed - Na in normal range and within 180 days    Sodium  Date Value Ref Range Status  04/13/2021 140 135 - 146 mmol/L Final  09/11/2017 138 136 - 145 mEq/L Final         Passed - Last BP in normal range    BP Readings from Last 1 Encounters:  12/18/21 122/86         Passed - Valid encounter within last 6 months    Recent Outpatient Visits          3 months ago Chronic midline low back pain with bilateral sciatica   [facility] [name], NP   11 months ago Essential hypertension   [facility] [place], Coralie Keens, NP

## 2022-04-05 ENCOUNTER — Ambulatory Visit: Payer: Self-pay | Admitting: Neurosurgery

## 2022-04-25 DIAGNOSIS — Z0289 Encounter for other administrative examinations: Secondary | ICD-10-CM

## 2022-05-07 ENCOUNTER — Ambulatory Visit: Payer: BC Managed Care – PPO | Admitting: Dietician

## 2022-05-17 ENCOUNTER — Ambulatory Visit: Payer: BC Managed Care – PPO | Admitting: Neurosurgery

## 2022-05-22 ENCOUNTER — Ambulatory Visit (INDEPENDENT_AMBULATORY_CARE_PROVIDER_SITE_OTHER): Payer: BC Managed Care – PPO | Admitting: Family Medicine

## 2022-05-24 ENCOUNTER — Encounter: Payer: Self-pay | Admitting: Internal Medicine

## 2022-05-25 ENCOUNTER — Encounter: Payer: Self-pay | Admitting: Internal Medicine

## 2022-05-25 ENCOUNTER — Telehealth (INDEPENDENT_AMBULATORY_CARE_PROVIDER_SITE_OTHER): Payer: BC Managed Care – PPO | Admitting: Internal Medicine

## 2022-05-25 DIAGNOSIS — F419 Anxiety disorder, unspecified: Secondary | ICD-10-CM

## 2022-05-25 DIAGNOSIS — M797 Fibromyalgia: Secondary | ICD-10-CM | POA: Diagnosis not present

## 2022-05-25 DIAGNOSIS — F439 Reaction to severe stress, unspecified: Secondary | ICD-10-CM

## 2022-05-25 DIAGNOSIS — F32A Depression, unspecified: Secondary | ICD-10-CM

## 2022-05-25 DIAGNOSIS — Z0289 Encounter for other administrative examinations: Secondary | ICD-10-CM | POA: Diagnosis not present

## 2022-05-25 NOTE — Patient Instructions (Signed)

## 2022-05-25 NOTE — Progress Notes (Signed)
Virtual Visit via Video Note  I connected with Stacy [name] on 05/25/22 at  4:00 PM EDT by a video enabled telemedicine application and verified that I am speaking with the correct person using two identifiers.  Location: Patient: Home Provider: Office  Persons participating in this video call: Webb Silversmith, NP and Jackie Plum.   I discussed the limitations of evaluation and management by telemedicine and the availability of in person appointments. The patient expressed understanding and agreed to proceed.  History of Present Illness:  Patient requesting FMLA form completion.  She is requesting leave of absence secondary to stress.  She reports situational stress related to her family and social situations.  She reports she is caring for a 77-year-old every other week due to lack of daycare.  She reports she is raising a 59 year old which presents some problems.  She reports her ex-husband was also recently admitted to the hospital for a week and then spent 4 additional weeks in a SNF for rehab and he just came home.  She reports caregiver stress having to care for him.  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  She reports the stress has caused her fibromyalgia to flare so she is having increased muscle and joint pain.  She would like a leave of absence from 9/6 to 10/9.  She has a history of anxiety and depression but is not currently taking any medications for this.  She is not currently seeing a therapist.  She denies SI/HI.   Past Medical History:  Diagnosis Date   Anxiety    Arthritis    Cancer ([place])    Complication of anesthesia    DIFFICULTY WAKING UP , LAST SURGERY NECK 2012 WAS FINE PER PT HERE AT [facility]   Depression    Fibromyalgia    Genetic testing 12/21/2016   Genetic testing reported out on 12/20/2016 through [facility]'s 46-gene Common Hereditary Cancers panel found no deleterious  mutations. The following 46 genes were analyzed: APC, ATM, AXIN2, BARD1, BMPR1A, BRCA1, BRCA2, BRIP1, CDH1, CDKN2A, CHEK2, CTNNA1, DICER1, EPCAM, GREM1, KIT, MEN1, MLH1, MSH2, MSH3, MSH6, MUTYH, NBN, NF1, NTHL1, PALB2, PDGFRA, PMS2, POLD1, POLE, PTEN, RAD50, RAD51C, RAD51D, SDH   Headache    HX MIGRAINES     Heart murmur    History of radiation therapy 02/14/17-04/03/2017   left breast 60.4 Gy: 50.4 Gy delivered in 28 fractions and a boost of 10 Gy delivered in 5 fractions.   Hypertension    Malignant neoplasm of upper-outer quadrant of left female breast ([place]) 08/23/2016   Mitral valve disorder    MVP (mitral valve prolapse)    AS INFANT   Neck pain    Personal history of chemotherapy    Personal history of radiation therapy    Vision abnormalities     Current Outpatient Medications  Medication Sig Dispense Refill   diazepam (VALIUM) 5 MG tablet 1 tab p.o. 1 hour prior to procedure-must have transportation 1 tablet 0   hydrochlorothiazide (HYDRODIURIL) 25 MG tablet TAKE 1 TABLET (25 MG TOTAL) BY MOUTH DAILY. 90 tablet 0   HYDROcodone-acetaminophen (NORCO/VICODIN) 5-325 MG tablet Take 1 tablet by mouth every 8 (eight) hours as needed for moderate pain. 15 tablet 0   ibuprofen (ADVIL,MOTRIN) 200 MG tablet Take 400 mg by mouth every 6 (six) hours as needed for mild pain.     predniSONE (DELTASONE) 10 MG tablet Take  3 tabs on days 1-3, 2 tabs on days 4-6, 1 tab on days 7-9 18 tablet 0   No current facility-administered medications for this visit.    Allergies  Allergen Reactions   Gabapentin Swelling    "makes me feel detached"   Naproxen Palpitations   Neulasta [Pegfilgrastim] Itching and Other (See Comments)    Patient stated,"reddish skin tone, body on fire, tingly tongue."   Erythromycin Nausea And Vomiting   Gabapentin Other (See Comments)    "Makes me feel out of my body"   Neulasta [Pegfilgrastim] Itching   Tramadol Other (See Comments)    headache   Erythromycin Nausea  And Vomiting   Naproxen Palpitations   Tramadol Hcl Other (See Comments)    Headaches    Family History  Adopted: Yes  Problem Relation Age of Onset   Breast cancer Brother 93       Maternal half-brother. Currently 84.   BRCA 1/2 Neg Hx     Social History   Socioeconomic History   Marital status: Married    Spouse name: Not on file   Number of children: 4   Years of education: Not on file   Highest education level: Not on file  Occupational History   Not on file  Tobacco Use   Smoking status: Every Day    Packs/day: 0.50    Types: Cigarettes   Smokeless tobacco: Never  Vaping Use   Vaping Use: Never used  Substance and Sexual Activity   Alcohol use: Never   Drug use: Not Currently   Sexual activity: Yes    Birth control/protection: Post-menopausal  Other Topics Concern   Not on file  Social History Narrative   ** Merged History Encounter **       Social Determinants of Health   Financial Resource Strain: Not on file  Food Insecurity: Not on file  Transportation Needs: Not on file  Physical Activity: Not on file  Stress: Not on file  Social Connections: Not on file  Intimate Partner Violence: Not on file     Constitutional: Patient reports fatigue.  Denies fever, malaise, headache or abrupt weight changes.  HEENT: Denies eye pain, eye redness, ear pain, ringing in the ears, wax buildup, runny nose, nasal congestion, bloody nose, or sore throat. Respiratory: Denies difficulty breathing, shortness of breath, cough or sputum production.   Cardiovascular: Denies chest pain, chest tightness, palpitations or swelling in the hands or feet.  Gastrointestinal: Denies abdominal pain, bloating, constipation, diarrhea or blood in the stool.  GU: Denies urgency, frequency, pain with urination, burning sensation, blood in urine, odor or discharge. Musculoskeletal: Patient reports increased muscle and joint pain.  Denies decrease in range of motion, difficulty with gait,  or joint swelling.  Skin: Denies redness, rashes, lesions or ulcercations.  Neurological: Patient reports insomnia, difficulty concentrating.  Denies dizziness, difficulty with memory, difficulty with speech or problems with balance and coordination.  Psych: Patient reports stress, has a history of anxiety and depression.  Denies SI/HI.  No other specific complaints in a complete review of systems (except as listed in HPI above).  Observations/Objective:  LMP 08/29/2016   Wt Readings from Last 3 Encounters:  03/26/22 229 lb 1.6 oz (103.9 kg)  12/18/21 227 lb (103 kg)  04/13/21 229 lb (103.9 kg)    General: Appears her stated age,obese, in NAD. Pulmonary/Chest: Normal effort. No respiratory distress.  Neurological: Alert and oriented.  Psychiatric: She is tearful and anxious appearing.  She is slightly unkempt.  BMET    Component Value Date/Time   NA 140 04/13/2021 0918   NA 138 09/11/2017 0902   K 4.6 04/13/2021 0918   K 3.5 09/11/2017 0902   CL 107 04/13/2021 0918   CO2 26 04/13/2021 0918   CO2 25 09/11/2017 0902   GLUCOSE 97 04/13/2021 0918   GLUCOSE 98 09/11/2017 0902   BUN 17 04/13/2021 0918   BUN 18.2 09/11/2017 0902   CREATININE 0.78 04/13/2021 0918   CREATININE 1.0 09/11/2017 0902   CALCIUM 9.2 04/13/2021 0918   CALCIUM 9.4 09/11/2017 0902   GFRNONAA >60 11/01/2018 1657   GFRAA >60 11/01/2018 1657    Lipid Panel     Component Value Date/Time   CHOL 190 04/13/2021 0918   TRIG 129 04/13/2021 0918   HDL 54 04/13/2021 0918   CHOLHDL 3.5 04/13/2021 0918   VLDL 23.4 11/07/2018 1432   LDLCALC 112 (H) 04/13/2021 0918    CBC    Component Value Date/Time   WBC 11.6 (H) 11/13/2019 1445   RBC 4.96 11/13/2019 1445   HGB 15.1 11/13/2019 1445   HGB 13.0 09/11/2017 0902   HCT 43.7 11/13/2019 1445   HCT 38.6 09/11/2017 0902   PLT 317 11/13/2019 1445   PLT 263 09/11/2017 0902   MCV 88.1 11/13/2019 1445   MCV 88.7 09/11/2017 0902   MCH 30.4 11/13/2019 1445    MCHC 34.6 11/13/2019 1445   RDW 12.9 11/13/2019 1445   RDW 13.4 09/11/2017 0902   LYMPHSABS 2.8 11/01/2018 1657   LYMPHSABS 1.4 09/11/2017 0902   MONOABS 1.3 (H) 11/01/2018 1657   MONOABS 0.8 09/11/2017 0902   EOSABS 0.1 11/01/2018 1657   EOSABS 0.2 09/11/2017 0902   BASOSABS 0.1 11/01/2018 1657   BASOSABS 0.0 09/11/2017 0902    Hgb A1C Lab Results  Component Value Date   HGBA1C 5.7 (H) 04/13/2021       Assessment and Plan:  Encounter for Form Completion with Patient, Stress, History of Anxiety and Depression, Fibromyalgia:  FMLA forms completed will be faxed back today We will hold off on medication treatment as this seems very situational and hopefully will improve with leave of absence We will would not be able to get her in with a therapist prior to her leave of absence ending so we will hold off on referral at this time So offered Schedule an appointment for your annual exam  Follow Up Instructions:    I discussed the assessment and treatment plan with the patient. The patient was provided an opportunity to ask questions and all were answered. The patient agreed with the plan and demonstrated an understanding of the instructions.   The patient was advised to call back or seek an in-person evaluation if the symptoms worsen or if the condition fails to improve as anticipated.    Webb Silversmith, NP

## 2022-06-05 ENCOUNTER — Ambulatory Visit (INDEPENDENT_AMBULATORY_CARE_PROVIDER_SITE_OTHER): Payer: BC Managed Care – PPO | Admitting: Family Medicine

## 2022-06-12 ENCOUNTER — Ambulatory Visit: Payer: Self-pay

## 2022-06-12 NOTE — Patient Outreach (Signed)
Care Coordination   Initial Visit Note   06/12/2022 Name: Madalene Mickler MRN: [id number] DOB: [birth year]  Loreta Blouch is a 61 y.o. year old female who sees Baity, Coralie Keens, NP for primary care. I spoke with  [name] by phone today.  What matters to the patients health and wellness today?  Maintaining balance and keeping her stress levels down so she can function daily     Goals Addressed             This Visit's Progress    RNCM: Effective Management of Stress and Anxiety       Care Coordination Interventions: Evaluation of current treatment plan related to situational stress, anxiety and depression and patient's adherence to plan as established by provider. The patient states that she is feeling better. She states her husband is doing better and she is trying to make sure he has what he needs. They do not live together but she assist with his care. She states she is going to have to go back to work 06-25-2022 and wants to know if the pcp needs her to send the paperwork. Will reach out to the pcp and staff for recommendations.  Advised patient to call the office for changes in mood, increased anxiety, depression, or other mental health needs Provided education to patient re: mindfulness and self care. Review of stressors and the patient states that things are better and she is going to have to go back to work on 06-25-2022.  Collaborated with pcp, CMA, and staff  regarding paperwork needs for release of going back to work and if the pcp needs this paperwork to be sent to her to fill out Provided patient with mindfulness and self care educational materials related to stress and anxiety the patient is experiencing Social Work referral for help with emotional and mental health well being. The patient does not feel she needs this right now. She knows the Education officer, museum is available to help with stressors and mental health and will let the RNCM know if this is needed in the  future.  Discussed plans with patient for ongoing care management follow up and provided patient with direct contact information for care management team Advised patient to discuss changes in her mood, anxiety, stress, depression and mental health with provider Screening for signs and symptoms of depression related to chronic disease state  Assessed social determinant of health barriers The patient agrees to outreaches by the [facility]. She knows that a pharmacist and LCSW are available for needs and resources. She will let the [facility] know if she needs support.   Active listening / Reflection utilized  Emotional Support Provided         SDOH assessments and interventions completed:  Yes  SDOH Interventions Today    Flowsheet Row Most Recent Value  SDOH Interventions   Food Insecurity Interventions Intervention Not Indicated  Housing Interventions Intervention Not Indicated  Transportation Interventions Intervention Not Indicated  Utilities Interventions Intervention Not Indicated  Financial Strain Interventions Intervention Not Indicated  Stress Interventions Other (Comment)  [offered LCSW support but the patient declined at this time, educationa and support given]        Care Coordination Interventions Activated:  Yes  Care Coordination Interventions:  Yes, provided   Follow up plan: Follow up call scheduled for 07-11-2022 at 1130 am    Encounter Outcome:  Pt. Visit Completed   Noreene Larsson RN, MSN, [place]  Mobile: [phone]

## 2022-06-12 NOTE — Progress Notes (Signed)
Can you please provide her with a note so that she can return to work on 06/25/2022.

## 2022-06-12 NOTE — Patient Instructions (Signed)
Visit Information  Thank you for taking time to visit with me today. Please don't hesitate to contact me if I can be of assistance to you.   Following are the goals we discussed today:   Goals Addressed             This Visit's Progress    RNCM: Effective Management of Stress and Anxiety       Care Coordination Interventions: Evaluation of current treatment plan related to situational stress, anxiety and depression and patient's adherence to plan as established by provider. The patient states that she is feeling better. She states her husband is doing better and she is trying to make sure he has what he needs. They do not live together but she assist with his care. She states she is going to have to go back to work 06-25-2022 and wants to know if the pcp needs her to send the paperwork. Will reach out to the pcp and staff for recommendations.  Advised patient to call the office for changes in mood, increased anxiety, depression, or other mental health needs Provided education to patient re: mindfulness and self care. Review of stressors and the patient states that things are better and she is going to have to go back to work on 06-25-2022.  Collaborated with pcp, CMA, and staff  regarding paperwork needs for release of going back to work and if the pcp needs this paperwork to be sent to her to fill out Provided patient with mindfulness and self care educational materials related to stress and anxiety the patient is experiencing Social Work referral for help with emotional and mental health well being. The patient does not feel she needs this right now. She knows the Education officer, [employer] is available to help with stressors and mental health and will let the RNCM know if this is needed in the future.  Discussed plans with patient for ongoing care management follow up and provided patient with direct contact information for care management team Advised patient to discuss changes in her mood, anxiety,  stress, depression and mental health with provider Screening for signs and symptoms of depression related to chronic disease state  Assessed social determinant of health barriers The patient agrees to outreaches by the [facility]. She knows that a pharmacist and LCSW are available for needs and resources. She will let the [facility] know if she needs support.   Active listening / Reflection utilized  Emotional Support Provided         Our next appointment is by telephone on 07-11-2022 at 1130 am  Please call the care guide team at [phone] if you need to cancel or reschedule your appointment.   If you are experiencing a Mental Health or [place] or need someone to talk to, please call the Suicide and Crisis Lifeline: 988 call the [place] National Suicide Prevention Lifeline: 231-627-5997 or TTY: 620-662-9427 TTY 952-718-5772) to talk to a trained counselor call 1-800-273-TALK (toll free, 24 hour hotline)  Patient verbalizes understanding of instructions and care plan provided today and agrees to view in [place]. Active MyChart status and patient understanding of how to access instructions and care plan via MyChart confirmed with patient.     Telephone follow up appointment with care management team member scheduled for: 07-11-2022 at 67 am  [place], MSN, [place]  Mobile: [phone]      Mindfulness-Based Stress Reduction Mindfulness-based stress reduction (MBSR) is a program that helps people learn to  practice mindfulness. Mindfulness is the practice of consciously paying attention to the present moment. MBSR focuses on developing self-awareness, which lets you respond to life stress without judgment or negative feelings. It can be learned and practiced through techniques such as education, breathing exercises, meditation, and yoga. MBSR includes several mindfulness techniques in one program. MBSR works best when you understand the  treatment, are willing to try new things, and can commit to spending time practicing what you learn. MBSR training may include learning about: How your feelings, thoughts, and reactions affect your body. New ways to respond to things that cause negative thoughts to start (triggers). How to notice your thoughts and let go of them. Practicing awareness of everyday things that you normally do without thinking. The techniques and goals of different types of meditation. What are the benefits of MBSR? MBSR can have many benefits, which include helping you to: Develop self-awareness. This means knowing and understanding yourself. Learn skills and attitudes that help you to take part in your own health care. Learn new ways to care for yourself. Be more accepting about how things are, and let things go. Be less judgmental and approach things with an open mind. Be patient with yourself and trust yourself more. MBSR has also been shown to: Reduce negative emotions, such as sadness, overwhelm, and worry. Improve memory and focus. Change how you sense and react to pain. Boost your body's ability to fight infections. Help you connect better with other people. Improve your sense of well-being. How to practice mindfulness To do a basic awareness exercise: Find a comfortable place to sit. Pay attention to the present moment. Notice your thoughts, feelings, and surroundings just as they are. Avoid judging yourself, your feelings, or your surroundings. Make note of any judgment that comes up and let it go. Your mind may wander, and that is okay. Make note of when your thoughts drift, and return your attention to the present moment. To do basic mindfulness meditation: Find a comfortable place to sit. This may include a stable chair or a firm floor cushion. Sit upright with your back straight. Let your arms fall next to your sides, with your hands resting on your legs. If you are sitting in a chair, rest  your feet flat on the floor. If you are sitting on a cushion, cross your legs in front of you. Keep your head in a neutral position with your chin dropped slightly. Relax your jaw and rest the tip of your tongue on the roof of your mouth. Drop your gaze to the floor or close your eyes. Breathe normally and pay attention to your breath. Feel the air moving in and out of your nose. Feel your belly expanding and relaxing with each breath. Your mind may wander, and that is okay. Make note of when your thoughts drift, and return your attention to your breath. Avoid judging yourself, your feelings, or your surroundings. Make note of any judgment or feelings that come up, let them go, and bring your attention back to your breath. When you are ready, lift your gaze or open your eyes. Pay attention to how your body feels after the meditation. Follow these instructions at home:  Find a local in-person or online MBSR program. Set aside some time regularly for mindfulness practice. Practice every day if you can. Even 10 minutes of practice is helpful. Find a mindfulness practice that works best for you. This may include one or more of the following: Meditation. This involves focusing  your mind on a certain thought or activity. Breathing awareness exercises. These help you to stay present by focusing on your breath. Body scan. For this practice, you lie down and pay attention to each part of your body from head to toe. You can identify tension and soreness and consciously relax parts of your body. Yoga. Yoga involves stretching and breathing, and it can improve your ability to move and be flexible. It can also help you to test your body's limits, which can help you release stress. Mindful eating. This way of eating involves focusing on the taste, texture, color, and smell of each bite of food. This slows down eating and helps you feel full sooner. For this reason, it can be an important part of a weight loss  plan. Find a podcast or recording that provides guidance for breathing awareness, body scan, or meditation exercises. You can listen to these any time when you have a free moment to rest without distractions. Follow your treatment plan as told by your health care provider. This may include taking regular medicines and making changes to your diet or lifestyle as recommended. Where to find more information You can find more information about MBSR from: Your health care provider. Community-based meditation centers or programs. Programs offered near you. Summary Mindfulness-based stress reduction (MBSR) is a program that teaches you how to consciously pay attention to the present moment. It is used to help you deal better with daily stress, feelings, and pain. MBSR focuses on developing self-awareness, which allows you to respond to life stress without judgment or negative feelings. MBSR programs may involve learning different mindfulness practices, such as breathing exercises, meditation, yoga, body scan, or mindful eating. Find a mindfulness practice that works best for you, and set aside time for it on a regular basis. This information is not intended to replace advice given to you by your health care provider. Make sure you discuss any questions you have with your health care provider. Document Revised: 04/13/2021 Document Reviewed: 04/13/2021 Elsevier Patient Education  [place].

## 2022-06-19 ENCOUNTER — Encounter: Payer: Self-pay | Admitting: Internal Medicine

## 2022-06-21 ENCOUNTER — Other Ambulatory Visit: Payer: Self-pay | Admitting: Internal Medicine

## 2022-06-21 DIAGNOSIS — Z1231 Encounter for screening mammogram for malignant neoplasm of breast: Secondary | ICD-10-CM

## 2022-06-24 ENCOUNTER — Other Ambulatory Visit: Payer: Self-pay | Admitting: Internal Medicine

## 2022-06-24 DIAGNOSIS — I10 Essential (primary) hypertension: Secondary | ICD-10-CM

## 2022-06-25 NOTE — Telephone Encounter (Signed)
Requested medication (s) are due for refill today: yes  Requested medication (s) are on the active medication list: yes  Last refill:  03/26/22 #90 0 refills  Future visit scheduled: yes in 3 weeks  Notes to clinic:  protocol failed. Last labs 04/13/21. Do you want to refill Rx?     Requested Prescriptions  Pending Prescriptions Disp Refills   hydrochlorothiazide (HYDRODIURIL) 25 MG tablet [Pharmacy Med Name: HYDROCHLOROTHIAZIDE 25 MG TAB] 90 tablet 0    Sig: TAKE 1 TABLET (25 MG TOTAL) BY MOUTH DAILY.     Cardiovascular: Diuretics - Thiazide Failed - 06/24/2022  9:33 AM      Failed - Cr in normal range and within 180 days    Creatinine  Date Value Ref Range Status  09/11/2017 1.0 0.6 - 1.1 mg/dL Final   Creat  Date Value Ref Range Status  04/13/2021 0.78 0.50 - 1.03 mg/dL Final         Failed - K in normal range and within 180 days    Potassium  Date Value Ref Range Status  04/13/2021 4.6 3.5 - 5.3 mmol/L Final  09/11/2017 3.5 3.5 - 5.1 mEq/L Final         Failed - Na in normal range and within 180 days    Sodium  Date Value Ref Range Status  04/13/2021 140 135 - 146 mmol/L Final  09/11/2017 138 136 - 145 mEq/L Final         Passed - Last BP in normal range    BP Readings from Last 1 Encounters:  12/18/21 122/86         Passed - Valid encounter within last 6 months    Recent Outpatient Visits           1 month ago Encounter for completion of form with patient   [facility], NP   6 months ago Chronic midline low back pain with bilateral sciatica   [facility], Coralie Keens, NP   1 year ago Essential hypertension   [place], NP       Future Appointments             In 3 weeks Garnette Gunner, Coralie Keens, NP [facility]

## 2022-06-27 ENCOUNTER — Encounter: Payer: Self-pay | Admitting: Dietician

## 2022-06-27 NOTE — Progress Notes (Signed)
Patient cancelled her MNT follow up on 05/07/22 as she was planning to start another weight loss program. Sent notification to referring provider.

## 2022-07-11 ENCOUNTER — Ambulatory Visit: Payer: Self-pay

## 2022-07-11 NOTE — Patient Instructions (Signed)
Visit Information  Thank you for taking time to visit with me today. Please don't hesitate to contact me if I can be of assistance to you.   Following are the goals we discussed today:   Goals Addressed             This Visit's Progress    COMPLETED: RNCM: Effective Management of Stress and Anxiety       Care Coordination Interventions: The patient states she is back and work and doing well. Denies any issues at this time. Stress level is better. No further outreach needed at this time. The patient knows to call for changes or new needs.  Evaluation of current treatment plan related to situational stress, anxiety and depression and patient's adherence to plan as established by provider. The patient states that she is feeling better. She states her husband is doing better and she is trying to make sure he has what he needs. They do not live together but she assist with his care. She states she is going to have to go back to work 06-25-2022 and wants to know if the pcp needs her to send the paperwork. Will reach out to the pcp and staff for recommendations.  Advised patient to call the office for changes in mood, increased anxiety, depression, or other mental health needs Provided education to patient re: mindfulness and self care. Review of stressors and the patient states that things are better and she is going to have to go back to work on 06-25-2022.  Collaborated with pcp, CMA, and staff  regarding paperwork needs for release of going back to work and if the pcp needs this paperwork to be sent to her to fill out Provided patient with mindfulness and self care educational materials related to stress and anxiety the patient is experiencing Social Work referral for help with emotional and mental health well being. The patient does not feel she needs this right now. She knows the Education officer, museum is available to help with stressors and mental health and will let the RNCM know if this is needed in the  future.  Discussed plans with patient for ongoing care management follow up and provided patient with direct contact information for care management team Advised patient to discuss changes in her mood, anxiety, stress, depression and mental health with provider Screening for signs and symptoms of depression related to chronic disease state  Assessed social determinant of health barriers The patient agrees to outreaches by the [facility]. She knows that a pharmacist and LCSW are available for needs and resources. She will let the [facility] know if she needs support.   Active listening / Reflection utilized  Emotional Support Provided           Please call the care guide team at [phone] if you need to schedule an appointment.   If you are experiencing a Mental Health or [place] or need someone to talk to, please call the Suicide and Crisis Lifeline: 988 call the [place] National Suicide Prevention Lifeline: 313-799-6798 or TTY: 574-299-9027 TTY (979)420-7989) to talk to a trained counselor call 1-800-273-TALK (toll free, 24 hour hotline)  Patient verbalizes understanding of instructions and care plan provided today and agrees to view in [place]. Active MyChart status and patient understanding of how to access instructions and care plan via MyChart confirmed with patient.     No further follow up required: the patient knows to call for changes or needs.  Noreene Larsson RN, MSN, CCM Community  [place]  Mobile: [phone]

## 2022-07-11 NOTE — Patient Outreach (Signed)
  Care Coordination   Follow Up Visit Note   07/11/2022 Name: Stacy [name] MRN: [id number] DOB: 02-Mar-1961  Stacy [name] is a 61 y.o. year old female who sees Baity, Coralie Keens, NP for primary care. I spoke with  Stacy [name] by phone today.  What matters to the patients health and wellness today?  Managing stress. The patient feels better and is back at work now.     Goals Addressed             This Visit's Progress    COMPLETED: RNCM: Effective Management of Stress and Anxiety       Care Coordination Interventions: The patient states she is back and work and doing well. Denies any issues at this time. Stress level is better. No further outreach needed at this time. The patient knows to call for changes or new needs.  Evaluation of current treatment plan related to situational stress, anxiety and depression and patient's adherence to plan as established by provider. The patient states that she is feeling better. She states her husband is doing better and she is trying to make sure he has what he needs. They do not live together but she assist with his care. She states she is going to have to go back to work 06-25-2022 and wants to know if the pcp needs her to send the paperwork. Will reach out to the pcp and staff for recommendations.  Advised patient to call the office for changes in mood, increased anxiety, depression, or other mental health needs Provided education to patient re: mindfulness and self care. Review of stressors and the patient states that things are better and she is going to have to go back to work on 06-25-2022.  Collaborated with pcp, CMA, and staff  regarding paperwork needs for release of going back to work and if the pcp needs this paperwork to be sent to her to fill out Provided patient with mindfulness and self care educational materials related to stress and anxiety the patient is experiencing Social Work referral for help with emotional and  mental health well being. The patient does not feel she needs this right now. She knows the Education officer, [employer] is available to help with stressors and mental health and will let the RNCM know if this is needed in the future.  Discussed plans with patient for ongoing care management follow up and provided patient with direct contact information for care management team Advised patient to discuss changes in her mood, anxiety, stress, depression and mental health with provider Screening for signs and symptoms of depression related to chronic disease state  Assessed social determinant of health barriers The patient agrees to outreaches by the [facility]. She knows that a pharmacist and LCSW are available for needs and resources. She will let the [facility] know if she needs support.   Active listening / Reflection utilized  Emotional Support Provided         SDOH assessments and interventions completed:  No     Care Coordination Interventions Activated:  Yes  Care Coordination Interventions:  Yes, provided   Follow up plan: No further intervention required.   Encounter Outcome:  Pt. Visit Completed

## 2022-07-16 ENCOUNTER — Other Ambulatory Visit (HOSPITAL_COMMUNITY)
Admission: RE | Admit: 2022-07-16 | Discharge: 2022-07-16 | Disposition: A | Discharge status: Home / Self Care | Payer: BC Managed Care – PPO | Source: Ambulatory Visit | Attending: Internal Medicine | Admitting: Internal Medicine

## 2022-07-16 ENCOUNTER — Encounter: Payer: Self-pay | Admitting: Internal Medicine

## 2022-07-16 ENCOUNTER — Ambulatory Visit: Payer: BC Managed Care – PPO | Admitting: Internal Medicine

## 2022-07-16 VITALS — BP 124/68 | HR 89 | Temp 98.1°F | Ht 63.5 in | Wt 232.0 lb

## 2022-07-16 DIAGNOSIS — Z6841 Body Mass Index (BMI) 40.0 and over, adult: Secondary | ICD-10-CM

## 2022-07-16 DIAGNOSIS — R7303 Prediabetes: Secondary | ICD-10-CM

## 2022-07-16 DIAGNOSIS — Z23 Encounter for immunization: Secondary | ICD-10-CM | POA: Diagnosis not present

## 2022-07-16 DIAGNOSIS — Z124 Encounter for screening for malignant neoplasm of cervix: Secondary | ICD-10-CM

## 2022-07-16 DIAGNOSIS — Z0001 Encounter for general adult medical examination with abnormal findings: Secondary | ICD-10-CM | POA: Diagnosis not present

## 2022-07-16 DIAGNOSIS — Z780 Asymptomatic menopausal state: Secondary | ICD-10-CM

## 2022-07-16 MED ORDER — PHENTERMINE HCL 15 MG PO CAPS: 15.0000 mg | ORAL_CAPSULE | ORAL | 0 refills | Status: DC

## 2022-07-16 NOTE — Progress Notes (Unsigned)
Subjective:    Patient ID: Stacy [name], female    DOB: [birth year], 61 y.o.   MRN: [id number]  HPI  Patient presents to clinic today for her annual exam.  Flu: 05/2015 Tetanus: 10/2010 COVID: Never Shingrix: Never Pap smear: > 5 years ago Mammogram: 07/2021, scheduled 08/2022 Bone density: Never Colon screening: 12/2021 Vision screening: as needed Dentist: as needed  Diet: She does eat meat. She consumes fruits and veggies. She does eat some fried foods. She drinks mostly water, tea and coffee. Exercise:  None  Review of Systems     Past Medical History:  Diagnosis Date   Anxiety    Arthritis    Cancer ([place])    Complication of anesthesia    DIFFICULTY WAKING UP , LAST SURGERY NECK 2012 WAS FINE PER PT HERE AT [facility]   Depression    Fibromyalgia    Genetic testing 12/21/2016   Genetic testing reported out on 12/20/2016 through [facility]'s 46-gene Common Hereditary Cancers panel found no deleterious mutations. The following 46 genes were analyzed: APC, ATM, AXIN2, BARD1, BMPR1A, BRCA1, BRCA2, BRIP1, CDH1, CDKN2A, CHEK2, CTNNA1, DICER1, EPCAM, GREM1, KIT, MEN1, MLH1, MSH2, MSH3, MSH6, MUTYH, NBN, NF1, NTHL1, PALB2, PDGFRA, PMS2, POLD1, POLE, PTEN, RAD50, RAD51C, RAD51D, SDH   Headache    HX MIGRAINES     Heart murmur    History of radiation therapy 02/14/17-04/03/2017   left breast 60.4 Gy: 50.4 Gy delivered in 28 fractions and a boost of 10 Gy delivered in 5 fractions.   Hypertension    Malignant neoplasm of upper-outer quadrant of left female breast ([place]) 08/23/2016   Mitral valve disorder    MVP (mitral valve prolapse)    AS INFANT   Neck pain    Personal history of chemotherapy    Personal history of radiation therapy    Vision abnormalities     Current Outpatient Medications  Medication Sig Dispense Refill   diazepam (VALIUM) 5 MG tablet 1 tab p.o. 1 hour prior to procedure-must have transportation 1 tablet 0   hydrochlorothiazide (HYDRODIURIL) 25 MG tablet TAKE  1 TABLET (25 MG TOTAL) BY MOUTH DAILY. 90 tablet 0   HYDROcodone-acetaminophen (NORCO/VICODIN) 5-325 MG tablet Take 1 tablet by mouth every 8 (eight) hours as needed for moderate pain. 15 tablet 0   ibuprofen (ADVIL,MOTRIN) 200 MG tablet Take 400 mg by mouth every 6 (six) hours as needed for mild pain.     predniSONE (DELTASONE) 10 MG tablet Take 3 tabs on days 1-3, 2 tabs on days 4-6, 1 tab on days 7-9 18 tablet 0   No current facility-administered medications for this visit.    Allergies  Allergen Reactions   Gabapentin Swelling    "makes me feel detached"   Naproxen Palpitations   Neulasta [Pegfilgrastim] Itching and Other (See Comments)    Patient stated,"reddish skin tone, body on fire, tingly tongue."   Erythromycin Nausea And Vomiting   Gabapentin Other (See Comments)    "Makes me feel out of my body"   Neulasta [Pegfilgrastim] Itching   Tramadol Other (See Comments)    headache   Erythromycin Nausea And Vomiting   Naproxen Palpitations   Tramadol Hcl Other (See Comments)    Headaches    Family History  Adopted: Yes  Problem Relation Age of Onset   Breast cancer Brother 1       Maternal half-brother. Currently 80.   BRCA 1/2 Neg Hx     Social History   Socioeconomic History  Marital status: Married    Spouse name: Not on file   Number of children: 4   Years of education: Not on file   Highest education level: Not on file  Occupational History   Not on file  Tobacco Use   Smoking status: Every Day    Packs/day: 0.50    Types: Cigarettes   Smokeless tobacco: Never  Vaping Use   Vaping Use: Never used  Substance and Sexual Activity   Alcohol use: Never   Drug use: Not Currently   Sexual activity: Yes    Birth control/protection: Post-menopausal  Other Topics Concern   Not on file  Social History Narrative   ** Merged History Encounter **       Social Determinants of Health   Financial Resource Strain: Low Risk  (06/12/2022)   Overall Financial  Resource Strain (CARDIA)    Difficulty of Paying Living Expenses: Not very hard  Food Insecurity: No Food Insecurity (06/12/2022)   Hunger Vital Sign    Worried About Running Out of Food in the Last Year: Never true    Ran Out of Food in the Last Year: Never true  Transportation Needs: No Transportation Needs (06/12/2022)   PRAPARE - Hydrologist (Medical): No    Lack of Transportation (Non-Medical): No  Physical Activity: Not on file  Stress: Stress Concern Present (06/12/2022)   [place]    Feeling of Stress : Rather much  Social Connections: Not on file  Intimate Partner Violence: Not on file     Constitutional: Denies fever, malaise, fatigue, headache or abrupt weight changes.  HEENT: Denies eye pain, eye redness, ear pain, ringing in the ears, wax buildup, runny nose, nasal congestion, bloody nose, or sore throat. Respiratory: Denies difficulty breathing, shortness of breath, cough or sputum production.   Cardiovascular: Denies chest pain, chest tightness, palpitations or swelling in the hands or feet.  Gastrointestinal: Pt reports LUQ abdominal pain. Denies abdominal pain, bloating, constipation, diarrhea or blood in the stool.  GU: Pt reports urinary incontinence. Deniesfrequency, pain with urination, burning sensation, blood in urine, odor or discharge. Musculoskeletal: Patient reports chronic joint muscle pain.  Denies decrease in range of motion, difficulty with gait, or joint swelling.  Skin: Denies redness, rashes, lesions or ulcercations.  Neurological: Denies dizziness, difficulty with memory, difficulty with speech or problems with balance and coordination.  Psych: Patient has a history of anxiety and depression.  Denies SI/HI.  No other specific complaints in a complete review of systems (except as listed in HPI above).  Objective:   Physical Exam BP 124/68 (BP Location:  Right Arm, Patient Position: Sitting, Cuff Size: Large)   Pulse 89   Temp 98.1 F (36.7 C) (Oral)   Ht 5' 3.5" (1.613 m)   Wt 232 lb (105.2 kg)   LMP 08/29/2016   SpO2 99%   BMI 40.45 kg/m   Wt Readings from Last 3 Encounters:  03/26/22 229 lb 1.6 oz (103.9 kg)  12/18/21 227 lb (103 kg)  04/13/21 229 lb (103.9 kg)    General: Appears her stated age, obese, in NAD. Skin: Warm, dry and intact.  HEENT: Head: normal shape and size; Eyes: sclera white, no icterus, conjunctiva pink, PERRLA and EOMs intact;  Neck:  Neck supple, trachea midline. No masses, lumps or thyromegaly present.  Cardiovascular: Normal rate and rhythm. S1,S2 noted.  No murmur, rubs or gallops noted. No JVD or BLE edema.  No carotid bruits noted. Pulmonary/Chest: Normal effort and positive vesicular breath sounds. No respiratory distress. No wheezes, rales or ronchi noted.  Abdomen: Normal bowel sounds. Pelvic: Normal female anatomy.  Cervix without mass or lesion.  No CMT noted.  Adnexa nonpalpable. Musculoskeletal: Strength 5/5 BUE/BLE.  No difficulty with gait.  Neurological: Alert and oriented. Cranial nerves II-XII grossly intact. Coordination normal.  Psychiatric: Mood and affect normal.  Tearful.  Judgment and thought content normal.    BMET    Component Value Date/Time   NA 140 04/13/2021 0918   NA 138 09/11/2017 0902   K 4.6 04/13/2021 0918   K 3.5 09/11/2017 0902   CL 107 04/13/2021 0918   CO2 26 04/13/2021 0918   CO2 25 09/11/2017 0902   GLUCOSE 97 04/13/2021 0918   GLUCOSE 98 09/11/2017 0902   BUN 17 04/13/2021 0918   BUN 18.2 09/11/2017 0902   CREATININE 0.78 04/13/2021 0918   CREATININE 1.0 09/11/2017 0902   CALCIUM 9.2 04/13/2021 0918   CALCIUM 9.4 09/11/2017 0902   GFRNONAA >60 11/01/2018 1657   GFRAA >60 11/01/2018 1657    Lipid Panel     Component Value Date/Time   CHOL 190 04/13/2021 0918   TRIG 129 04/13/2021 0918   HDL 54 04/13/2021 0918   CHOLHDL 3.5 04/13/2021 0918   VLDL  23.4 11/07/2018 1432   LDLCALC 112 (H) 04/13/2021 0918    CBC    Component Value Date/Time   WBC 11.6 (H) 11/13/2019 1445   RBC 4.96 11/13/2019 1445   HGB 15.1 11/13/2019 1445   HGB 13.0 09/11/2017 0902   HCT 43.7 11/13/2019 1445   HCT 38.6 09/11/2017 0902   PLT 317 11/13/2019 1445   PLT 263 09/11/2017 0902   MCV 88.1 11/13/2019 1445   MCV 88.7 09/11/2017 0902   MCH 30.4 11/13/2019 1445   MCHC 34.6 11/13/2019 1445   RDW 12.9 11/13/2019 1445   RDW 13.4 09/11/2017 0902   LYMPHSABS 2.8 11/01/2018 1657   LYMPHSABS 1.4 09/11/2017 0902   MONOABS 1.3 (H) 11/01/2018 1657   MONOABS 0.8 09/11/2017 0902   EOSABS 0.1 11/01/2018 1657   EOSABS 0.2 09/11/2017 0902   BASOSABS 0.1 11/01/2018 1657   BASOSABS 0.0 09/11/2017 0902    Hgb A1C Lab Results  Component Value Date   HGBA1C 5.7 (H) 04/13/2021            Assessment & Plan:   Preventative Health Maintenance:  She declines flu shot Tdap today Encouraged her to get her COVID-vaccine Discussed Shingrix vaccine, she will check coverage with her insurance company and schedule a nurse visit if she would like to have this done Pap smear today, she declines STD screening Mammogram has already been scheduled Bone density ordered-she will call to schedule Colon screening UTD Encourage her to consume a balanced diet and exercise regimen Advised her to see an eye doctor and dentist annually We will CBC, c-Met, lipid, A1c  RTC in 6 months, follow-up chronic conditions Webb Silversmith, NP

## 2022-07-16 NOTE — Patient Instructions (Signed)
Health Maintenance for Postmenopausal Women Menopause is a normal process in which your ability to get pregnant comes to an end. This process happens slowly over many months or years, usually between the ages of 48 and 55. Menopause is complete when you have missed your menstrual period for 12 months. It is important to talk with your health care provider about some of the most common conditions that affect women after menopause (postmenopausal women). These include heart disease, cancer, and bone loss (osteoporosis). Adopting a healthy lifestyle and getting preventive care can help to promote your health and wellness. The actions you take can also lower your chances of developing some of these common conditions. What are the signs and symptoms of menopause? During menopause, you may have the following symptoms: Hot flashes. These can be moderate or severe. Night sweats. Decrease in sex drive. Mood swings. Headaches. Tiredness (fatigue). Irritability. Memory problems. Problems falling asleep or staying asleep. Talk with your health care provider about treatment options for your symptoms. Do I need hormone replacement therapy? Hormone replacement therapy is effective in treating symptoms that are caused by menopause, such as hot flashes and night sweats. Hormone replacement carries certain risks, especially as you become older. If you are thinking about using estrogen or estrogen with progestin, discuss the benefits and risks with your health care provider. How can I reduce my risk for heart disease and stroke? The risk of heart disease, heart attack, and stroke increases as you age. One of the causes may be a change in the body's hormones during menopause. This can affect how your body uses dietary fats, triglycerides, and cholesterol. Heart attack and stroke are medical emergencies. There are many things that you can do to help prevent heart disease and stroke. Watch your blood pressure High  blood pressure causes heart disease and increases the risk of stroke. This is more likely to develop in people who have high blood pressure readings or are overweight. Have your blood pressure checked: Every 3-5 years if you are 18-39 years of age. Every year if you are 40 years old or older. Eat a healthy diet  Eat a diet that includes plenty of vegetables, fruits, low-fat dairy products, and lean protein. Do not eat a lot of foods that are high in solid fats, added sugars, or sodium. Get regular exercise Get regular exercise. This is one of the most important things you can do for your health. Most adults should: Try to exercise for at least 150 minutes each week. The exercise should increase your heart rate and make you sweat (moderate-intensity exercise). Try to do strengthening exercises at least twice each week. Do these in addition to the moderate-intensity exercise. Spend less time sitting. Even light physical activity can be beneficial. Other tips Work with your health care provider to achieve or maintain a healthy weight. Do not use any products that contain nicotine or tobacco. These products include cigarettes, chewing tobacco, and vaping devices, such as e-cigarettes. If you need help quitting, ask your health care provider. Know your numbers. Ask your health care provider to check your cholesterol and your blood sugar (glucose). Continue to have your blood tested as directed by your health care provider. Do I need screening for cancer? Depending on your health history and family history, you may need to have cancer screenings at different stages of your life. This may include screening for: Breast cancer. Cervical cancer. Lung cancer. Colorectal cancer. What is my risk for osteoporosis? After menopause, you may be   at increased risk for osteoporosis. Osteoporosis is a condition in which bone destruction happens more quickly than new bone creation. To help prevent osteoporosis or  the bone fractures that can happen because of osteoporosis, you may take the following actions: If you are 19-50 years old, get at least 1,000 mg of calcium and at least 600 international units (IU) of vitamin D per day. If you are older than age 50 but younger than age 70, get at least 1,200 mg of calcium and at least 600 international units (IU) of vitamin D per day. If you are older than age 70, get at least 1,200 mg of calcium and at least 800 international units (IU) of vitamin D per day. Smoking and drinking excessive alcohol increase the risk of osteoporosis. Eat foods that are rich in calcium and vitamin D, and do weight-bearing exercises several times each week as directed by your health care provider. How does menopause affect my mental health? Depression may occur at any age, but it is more common as you become older. Common symptoms of depression include: Feeling depressed. Changes in sleep patterns. Changes in appetite or eating patterns. Feeling an overall lack of motivation or enjoyment of activities that you previously enjoyed. Frequent crying spells. Talk with your health care provider if you think that you are experiencing any of these symptoms. General instructions See your health care provider for regular wellness exams and vaccines. This may include: Scheduling regular health, dental, and eye exams. Getting and maintaining your vaccines. These include: Influenza vaccine. Get this vaccine each year before the flu season begins. Pneumonia vaccine. Shingles vaccine. Tetanus, diphtheria, and pertussis (Tdap) booster vaccine. Your health care provider may also recommend other immunizations. Tell your health care provider if you have ever been abused or do not feel safe at home. Summary Menopause is a normal process in which your ability to get pregnant comes to an end. This condition causes hot flashes, night sweats, decreased interest in sex, mood swings, headaches, or lack  of sleep. Treatment for this condition may include hormone replacement therapy. Take actions to keep yourself healthy, including exercising regularly, eating a healthy diet, watching your weight, and checking your blood pressure and blood sugar levels. Get screened for cancer and depression. Make sure that you are up to date with all your vaccines. This information is not intended to replace advice given to you by your health care provider. Make sure you discuss any questions you have with your health care provider. Document Revised: 01/23/2021 Document Reviewed: 01/23/2021 Elsevier Patient Education  2023 Elsevier Inc.  

## 2022-07-16 NOTE — Assessment & Plan Note (Addendum)
Encouraged diet and exercise for weight loss We will trial phentermine

## 2022-07-17 LAB — COMPLETE METABOLIC PANEL WITH GFR
AG Ratio: 1.5 (calc) (ref 1.0–2.5)
ALT: 12 U/L (ref 6–29)
AST: 15 U/L (ref 10–35)
Albumin: 4.3 g/dL (ref 3.6–5.1)
Alkaline phosphatase (APISO): 65 U/L (ref 37–153)
BUN/Creatinine Ratio: 20 (calc) (ref 6–22)
BUN: 21 mg/dL (ref 7–25)
CO2: 22 mmol/L (ref 20–32)
Calcium: 9.8 mg/dL (ref 8.6–10.4)
Chloride: 104 mmol/L (ref 98–110)
Creat: 1.07 mg/dL — ABNORMAL HIGH (ref 0.50–1.03)
Globulin: 2.8 g/dL (calc) (ref 1.9–3.7)
Glucose, Bld: 88 mg/dL (ref 65–139)
Potassium: 4.1 mmol/L (ref 3.5–5.3)
Sodium: 139 mmol/L (ref 135–146)
Total Bilirubin: 0.3 mg/dL (ref 0.2–1.2)
Total Protein: 7.1 g/dL (ref 6.1–8.1)
eGFR: 60 mL/min/{1.73_m2} (ref >=60)

## 2022-07-17 LAB — LIPID PANEL
Cholesterol: 212 mg/dL — ABNORMAL HIGH (ref <200)
HDL: 59 mg/dL (ref >=50)
LDL Cholesterol (Calc): 119 mg/dL (calc) — ABNORMAL HIGH
Non-HDL Cholesterol (Calc): 153 mg/dL (calc) — ABNORMAL HIGH (ref <130)
Total CHOL/HDL Ratio: 3.6 (calc) (ref <5.0)
Triglycerides: 225 mg/dL — ABNORMAL HIGH (ref <150)

## 2022-07-17 LAB — CBC
HCT: 42.5 % (ref 35.0–45.0)
Hemoglobin: 14.5 g/dL (ref 11.7–15.5)
MCH: 30.1 pg (ref 27.0–33.0)
MCHC: 34.1 g/dL (ref 32.0–36.0)
MCV: 88.4 fL (ref 80.0–100.0)
MPV: 11.1 fL (ref 7.5–12.5)
Platelets: 332 10*3/uL (ref 140–400)
RBC: 4.81 10*6/uL (ref 3.80–5.10)
RDW: 12.9 % (ref 11.0–15.0)
WBC: 12.8 10*3/uL — ABNORMAL HIGH (ref 3.8–10.8)

## 2022-07-17 LAB — HEMOGLOBIN A1C
Hgb A1c MFr Bld: 5.9 % of total Hgb — ABNORMAL HIGH (ref <5.7)
Mean Plasma Glucose: 123 mg/dL
eAG (mmol/L): 6.8 mmol/L

## 2022-07-20 LAB — CYTOLOGY - PAP
Comment: NEGATIVE
Diagnosis: NEGATIVE
High risk HPV: NEGATIVE

## 2022-08-17 ENCOUNTER — Other Ambulatory Visit: Payer: Self-pay | Admitting: Internal Medicine

## 2022-08-17 ENCOUNTER — Ambulatory Visit
Admission: RE | Admit: 2022-08-17 | Discharge: 2022-08-17 | Disposition: A | Discharge status: Home / Self Care | Payer: BC Managed Care – PPO | Source: Ambulatory Visit | Attending: Internal Medicine | Admitting: Internal Medicine

## 2022-08-17 DIAGNOSIS — N644 Mastodynia: Secondary | ICD-10-CM

## 2022-08-17 DIAGNOSIS — Z853 Personal history of malignant neoplasm of breast: Secondary | ICD-10-CM | POA: Diagnosis not present

## 2022-08-17 DIAGNOSIS — Z1231 Encounter for screening mammogram for malignant neoplasm of breast: Secondary | ICD-10-CM

## 2022-08-17 HISTORY — DX: Malignant neoplasm of unspecified site of unspecified female breast: C50.919

## 2022-09-23 ENCOUNTER — Other Ambulatory Visit: Payer: Self-pay | Admitting: Internal Medicine

## 2022-09-23 DIAGNOSIS — I10 Essential (primary) hypertension: Secondary | ICD-10-CM

## 2022-09-24 NOTE — Telephone Encounter (Signed)
Requested Prescriptions  Pending Prescriptions Disp Refills   hydrochlorothiazide (HYDRODIURIL) 25 MG tablet [Pharmacy Med Name: HYDROCHLOROTHIAZIDE 25 MG TAB] 90 tablet 0    Sig: TAKE 1 TABLET (25 MG TOTAL) BY MOUTH DAILY.     Cardiovascular: Diuretics - Thiazide Failed - 09/23/2022  8:47 AM      Failed - Cr in normal range and within 180 days    Creatinine  Date Value Ref Range Status  09/11/2017 1.0 0.6 - 1.1 mg/dL Final   Creat  Date Value Ref Range Status  07/16/2022 1.07 (H) 0.50 - 1.03 mg/dL Final         Passed - K in normal range and within 180 days    Potassium  Date Value Ref Range Status  07/16/2022 4.1 3.5 - 5.3 mmol/L Final  09/11/2017 3.5 3.5 - 5.1 mEq/L Final         Passed - Na in normal range and within 180 days    Sodium  Date Value Ref Range Status  07/16/2022 139 135 - 146 mmol/L Final  09/11/2017 138 136 - 145 mEq/L Final         Passed - Last BP in normal range    BP Readings from Last 1 Encounters:  07/16/22 124/68         Passed - Valid encounter within last 6 months    Recent Outpatient Visits           2 months ago Encounter for general adult medical examination with abnormal findings   [place], NP   4 months ago Encounter for completion of form with patient   [facility] [place], Coralie Keens, NP   9 months ago Chronic midline low back pain with bilateral sciatica   [facility] [place], Coralie Keens, NP   1 year ago Essential hypertension   [place] [place], Coralie Keens, NP

## 2022-12-25 ENCOUNTER — Encounter: Payer: Self-pay | Admitting: Hematology and Oncology

## 2022-12-28 ENCOUNTER — Other Ambulatory Visit: Payer: Self-pay | Admitting: Internal Medicine

## 2022-12-28 DIAGNOSIS — I10 Essential (primary) hypertension: Secondary | ICD-10-CM

## 2022-12-31 NOTE — Telephone Encounter (Signed)
Requested Prescriptions  Pending Prescriptions Disp Refills   hydrochlorothiazide (HYDRODIURIL) 25 MG tablet [Pharmacy Med Name: HYDROCHLOROTHIAZIDE 25 MG TAB] 90 tablet 0    Sig: TAKE 1 TABLET BY MOUTH EVERY DAY     Cardiovascular: Diuretics - Thiazide Failed - 12/28/2022  2:30 PM      Failed - Cr in normal range and within 180 days    Creatinine  Date Value Ref Range Status  09/11/2017 1.0 0.6 - 1.1 mg/dL Final   Creat  Date Value Ref Range Status  07/16/2022 1.07 (H) 0.50 - 1.03 mg/dL Final         Passed - K in normal range and within 180 days    Potassium  Date Value Ref Range Status  07/16/2022 4.1 3.5 - 5.3 mmol/L Final  09/11/2017 3.5 3.5 - 5.1 mEq/L Final         Passed - Na in normal range and within 180 days    Sodium  Date Value Ref Range Status  07/16/2022 139 135 - 146 mmol/L Final  09/11/2017 138 136 - 145 mEq/L Final         Passed - Last BP in normal range    BP Readings from Last 1 Encounters:  07/16/22 124/68         Passed - Valid encounter within last 6 months    Recent Outpatient Visits           5 months ago Encounter for general adult medical examination with abnormal findings   [place] [facility] [place], Salvadore Oxford, NP   7 months ago Encounter for completion of form with patient   [place] [facility] [place], Salvadore Oxford, NP   1 year ago Chronic midline low back pain with bilateral sciatica   [place] [facility] [place], Salvadore Oxford, NP   1 year ago Essential hypertension   [place] [facility] [place], Salvadore Oxford, [place]

## 2023-01-01 ENCOUNTER — Encounter: Payer: Self-pay | Admitting: Hematology and Oncology

## 2023-01-01 ENCOUNTER — Ambulatory Visit
Admission: RE | Admit: 2023-01-01 | Discharge: 2023-01-01 | Disposition: A | Discharge status: Home / Self Care | Payer: No Typology Code available for payment source | Source: Ambulatory Visit | Attending: Internal Medicine | Admitting: Internal Medicine

## 2023-01-01 DIAGNOSIS — Z780 Asymptomatic menopausal state: Secondary | ICD-10-CM

## 2023-03-17 ENCOUNTER — Other Ambulatory Visit: Payer: Self-pay | Admitting: Internal Medicine

## 2023-03-17 DIAGNOSIS — I10 Essential (primary) hypertension: Secondary | ICD-10-CM

## 2023-03-19 ENCOUNTER — Encounter: Payer: Self-pay | Admitting: Hematology and Oncology

## 2023-03-19 NOTE — Telephone Encounter (Signed)
Requested medication (s) are due for refill today: yes  Requested medication (s) are on the active medication list: yes  Last refill:  07/16/22 #30  Future visit scheduled: no  Notes to clinic:  med not delegated to NT to RF   Requested Prescriptions  Pending Prescriptions Disp Refills   phentermine 15 MG capsule [Pharmacy Med Name: PHENTERMINE 15 MG CAPSULE] 30 capsule     Sig: TAKE 1 CAPSULE BY MOUTH EVERY DAY IN THE MORNING     Not Delegated - Neurology: Anticonvulsants - Controlled - phentermine hydrochloride Failed - 03/17/2023 12:24 PM      Failed - This refill cannot be delegated      Failed - Cr in normal range and within 360 days    Creatinine  Date Value Ref Range Status  09/11/2017 1.0 0.6 - 1.1 mg/dL Final   Creat  Date Value Ref Range Status  07/16/2022 1.07 (H) 0.50 - 1.03 mg/dL Final         Failed - Valid encounter within last 6 months    Recent Outpatient Visits           8 months ago Encounter for general adult medical examination with abnormal findings   [place] [facility] [place], Salvadore Oxford, NP   9 months ago Encounter for completion of form with patient   [place] [facility] [place], Salvadore Oxford, NP   1 year ago Chronic midline low back pain with bilateral sciatica   [place] [facility] [place], Salvadore Oxford, NP   1 year ago Essential hypertension   [place] [facility] [place], [place] W, NP              Failed - Weight completed in the last 3 months    Wt Readings from Last 1 Encounters:  07/16/22 232 lb (105.2 kg)         Passed - eGFR in normal range and within 360 days    GFR calc Af Amer  Date Value Ref Range Status  11/01/2018 >60 >60 mL/min Final   GFR calc non Af Amer  Date Value Ref Range Status  11/01/2018 >60 >60 mL/min Final   GFR  Date Value Ref Range Status  12/31/2017 63.53 >60.00 mL/min Final   eGFR  Date Value Ref Range Status  07/16/2022 60 > OR =  60 mL/min/1.34m2 Final         Passed - Last BP in normal range    BP Readings from Last 1 Encounters:  07/16/22 124/68          hydrochlorothiazide (HYDRODIURIL) 25 MG tablet [Pharmacy Med Name: HYDROCHLOROTHIAZIDE 25 MG TAB] 90 tablet 0    Sig: TAKE 1 TABLET BY MOUTH EVERY DAY     Cardiovascular: Diuretics - Thiazide Failed - 03/17/2023 12:24 PM      Failed - Cr in normal range and within 180 days    Creatinine  Date Value Ref Range Status  09/11/2017 1.0 0.6 - 1.1 mg/dL Final   Creat  Date Value Ref Range Status  07/16/2022 1.07 (H) 0.50 - 1.03 mg/dL Final         Failed - K in normal range and within 180 days    Potassium  Date Value Ref Range Status  07/16/2022 4.1 3.5 - 5.3 mmol/L Final  09/11/2017 3.5 3.5 - 5.1 mEq/L Final         Failed - Na in normal range and  within 180 days    Sodium  Date Value Ref Range Status  07/16/2022 139 135 - 146 mmol/L Final  09/11/2017 138 136 - 145 mEq/L Final         Failed - Valid encounter within last 6 months    Recent Outpatient Visits           8 months ago Encounter for general adult medical examination with abnormal findings   [place] [facility] [place], Salvadore Oxford, NP   9 months ago Encounter for completion of form with patient   [place] [facility] [place], Salvadore Oxford, NP   1 year ago Chronic midline low back pain with bilateral sciatica   [place] [facility] [place], Salvadore Oxford, NP   1 year ago Essential hypertension   [place] [facility] [place], [place], NP              Passed - Last BP in normal range    BP Readings from Last 1 Encounters:  07/16/22 124/68

## 2023-04-04 ENCOUNTER — Ambulatory Visit: Payer: No Typology Code available for payment source | Admitting: Internal Medicine

## 2023-04-04 VITALS — BP 134/86 | HR 86 | Temp 96.6°F | Ht 63.0 in | Wt 224.0 lb

## 2023-04-04 DIAGNOSIS — M1990 Unspecified osteoarthritis, unspecified site: Secondary | ICD-10-CM | POA: Diagnosis not present

## 2023-04-04 DIAGNOSIS — E7800 Pure hypercholesterolemia, unspecified: Secondary | ICD-10-CM

## 2023-04-04 DIAGNOSIS — R7303 Prediabetes: Secondary | ICD-10-CM | POA: Diagnosis not present

## 2023-04-04 DIAGNOSIS — Z853 Personal history of malignant neoplasm of breast: Secondary | ICD-10-CM

## 2023-04-04 DIAGNOSIS — F32A Depression, unspecified: Secondary | ICD-10-CM

## 2023-04-04 DIAGNOSIS — I10 Essential (primary) hypertension: Secondary | ICD-10-CM | POA: Diagnosis not present

## 2023-04-04 DIAGNOSIS — F419 Anxiety disorder, unspecified: Secondary | ICD-10-CM

## 2023-04-04 DIAGNOSIS — Z6839 Body mass index (BMI) 39.0-39.9, adult: Secondary | ICD-10-CM

## 2023-04-04 DIAGNOSIS — M797 Fibromyalgia: Secondary | ICD-10-CM

## 2023-04-04 NOTE — Assessment & Plan Note (Signed)
Controlled on hydrochlorothiazide Reinforced DASH diet and exercise for weight loss C-Met today

## 2023-04-04 NOTE — Progress Notes (Addendum)
Subjective:    Patient ID: Stacy [name], female    DOB: 1960/10/03, 62 y.o.   MRN: [id number]  HPI  Patient presents to clinic today for follow-up of chronic conditions.  Anxiety and depression: Situational.  She is not currently taking any medications for this.  She is not seeing a therapist.  She denies SI/HI.  OA/fibromyalgia: Managed with ibuprofen as needed with some relief of symptoms.  She does not follow with orthopedics or pain management.  HTN: Her BP today is 134/86.  She is taking HCTZ as prescribed.  ECG from 01/2017 reviewed.  History of left breast cancer: Status post excision, chemo and radiation.  She no longer follows with oncology.  GERD: Triggered by spicy and greasy foods.  She does not take any medications OTC for this.  There is no upper GI on file.  HLD: Her last LDL was 119, triglycerides 225, 06/2022.  She is not taking any cholesterol-lowering medication at this time.  She tries to consume low-fat diet  Prediabetes: Her last A1c was 5.9%, 06/2022.  She is not taking any oral diabetic medication at this time.  She does not check her sugars.  Review of Systems     Past Medical History:  Diagnosis Date   Anxiety    Arthritis    Breast cancer (HCC)    Cancer (HCC)    Complication of anesthesia    DIFFICULTY WAKING UP , LAST SURGERY NECK 2012 WAS FINE PER PT HERE AT [facility]   Depression    Fibromyalgia    Genetic testing 12/21/2016   Genetic testing reported out on 12/20/2016 through [facility]'s 46-gene Common Hereditary Cancers panel found no deleterious mutations. The following 46 genes were analyzed: APC, ATM, AXIN2, BARD1, BMPR1A, BRCA1, BRCA2, BRIP1, CDH1, CDKN2A, CHEK2, CTNNA1, DICER1, EPCAM, GREM1, KIT, MEN1, MLH1, MSH2, MSH3, MSH6, MUTYH, NBN, NF1, NTHL1, PALB2, PDGFRA, PMS2, POLD1, POLE, PTEN, RAD50, RAD51C, RAD51D, SDH   Headache    HX MIGRAINES     Heart murmur    History of radiation therapy 02/14/17-04/03/2017   left breast 60.4 Gy: 50.4  Gy delivered in 28 fractions and a boost of 10 Gy delivered in 5 fractions.   Hypertension    Malignant neoplasm of upper-outer quadrant of left female breast (HCC) 08/23/2016   Mitral valve disorder    MVP (mitral valve prolapse)    AS INFANT   Neck pain    Personal history of chemotherapy    Personal history of radiation therapy    Vision abnormalities     Current Outpatient Medications  Medication Sig Dispense Refill   hydrochlorothiazide (HYDRODIURIL) 25 MG tablet TAKE 1 TABLET BY MOUTH EVERY DAY 90 tablet 0   ibuprofen (ADVIL,MOTRIN) 200 MG tablet Take 400 mg by mouth every 6 (six) hours as needed for mild pain.     phentermine 15 MG capsule Take 1 capsule (15 mg total) by mouth every morning. 30 capsule 0   No current facility-administered medications for this visit.    Allergies  Allergen Reactions   Gabapentin Swelling    "makes me feel detached"   Naproxen Palpitations   Neulasta [Pegfilgrastim] Itching and Other (See Comments)    Patient stated,"reddish skin tone, body on fire, tingly tongue."   Erythromycin Nausea And Vomiting   Gabapentin Other (See Comments)    "Makes me feel out of my body"   Neulasta [Pegfilgrastim] Itching   Tramadol Other (See Comments)    headache   Erythromycin Nausea And Vomiting  Naproxen Palpitations   Tramadol Hcl Other (See Comments)    Headaches    Family History  Adopted: Yes  Problem Relation Age of Onset   Ehlers-Danlos syndrome Sister    Breast cancer Brother 69       Maternal half-brother. Currently 59.   BRCA 1/2 Neg Hx     Social History   Socioeconomic History   Marital status: Married    Spouse name: Not on file   Number of children: 4   Years of education: Not on file   Highest education level: Not on file  Occupational History   Not on file  Tobacco Use   Smoking status: Every Day    Current packs/day: 0.50    Types: Cigarettes   Smokeless tobacco: Never  Vaping Use   Vaping status: Never Used   Substance and Sexual Activity   Alcohol use: Never   Drug use: Not Currently   Sexual activity: Yes    Birth control/protection: Post-menopausal  Other Topics Concern   Not on file  Social History Narrative   ** Merged History Encounter **       Social Determinants of Health   Financial Resource Strain: Low Risk  (06/12/2022)   Overall Financial Resource Strain (CARDIA)    Difficulty of Paying Living Expenses: Not very hard  Food Insecurity: No Food Insecurity (06/12/2022)   Hunger Vital Sign    Worried About Running Out of Food in the Last Year: Never true    Ran Out of Food in the Last Year: Never true  Transportation Needs: No Transportation Needs (06/12/2022)   PRAPARE - Administrator, Civil Service (Medical): No    Lack of Transportation (Non-Medical): No  Physical Activity: Not on file  Stress: Stress Concern Present (06/12/2022)   [employer] of Occupational Health - Occupational Stress Questionnaire    Feeling of Stress : Rather much  Social Connections: Not on file  Intimate Partner Violence: Not on file     Constitutional: Denies fever, malaise, fatigue, headache or abrupt weight changes.  HEENT: Denies eye pain, eye redness, ear pain, ringing in the ears, wax buildup, runny nose, nasal congestion, bloody nose, or sore throat. Respiratory: Denies difficulty breathing, shortness of breath, cough or sputum production.   Cardiovascular: Denies chest pain, chest tightness, palpitations or swelling in the hands or feet.  Gastrointestinal: Patient reports intermittent reflux.  Denies abdominal pain, bloating, constipation, diarrhea or blood in the stool.  GU: Denies urgency, frequency, pain with urination, burning sensation, blood in urine, odor or discharge. Musculoskeletal: Patient reports chronic muscle and joint pain.  Denies decrease in range of motion, difficulty with gait, or joint swelling.  Skin: Denies redness, rashes, lesions or ulcercations.   Neurological: Denies dizziness, difficulty with memory, difficulty with speech or problems with balance and coordination.  Psych: Patient has a history of anxiety and depression.  Denies SI/HI.  No other specific complaints in a complete review of systems (except as listed in HPI above).  Objective:   Physical Exam  BP 134/86 (BP Location: Left Arm, Patient Position: Sitting, Cuff Size: Large)   Pulse 86   Temp (!) 96.6 F (35.9 C) (Temporal)   Ht 5\' 3"  (1.6 m)   Wt 224 lb (101.6 kg)   LMP 08/29/2016   SpO2 96%   BMI 39.68 kg/m   Wt Readings from Last 3 Encounters:  07/16/22 232 lb (105.2 kg)  03/26/22 229 lb 1.6 oz (103.9 kg)  12/18/21 227 lb (  103 kg)    General: Appears her stated age, obese in NAD. Skin: Warm, dry and intact.  HEENT: Head: normal shape and size; Eyes: sclera white, no icterus, conjunctiva pink, PERRLA and EOMs intact;  Cardiovascular: Normal rate and rhythm. S1,S2 noted. Murmur noted. No JVD or BLE edema. No carotid bruits noted. Pulmonary/Chest: Normal effort and positive vesicular breath sounds. No respiratory distress. No wheezes, rales or ronchi noted.  Abdomen: Normal bowel sounds.  Musculoskeletal: She has difficulty getting up on the exam table.  No difficulty with gait.  Neurological: Alert and oriented. Coordination normal.  Psychiatric: Mood and affect normal. Behavior is normal. Judgment and thought content normal.    BMET    Component Value Date/Time   NA 139 07/16/2022 1555   NA 138 09/11/2017 0902   K 4.1 07/16/2022 1555   K 3.5 09/11/2017 0902   CL 104 07/16/2022 1555   CO2 22 07/16/2022 1555   CO2 25 09/11/2017 0902   GLUCOSE 88 07/16/2022 1555   GLUCOSE 98 09/11/2017 0902   BUN 21 07/16/2022 1555   BUN 18.2 09/11/2017 0902   CREATININE 1.07 (H) 07/16/2022 1555   CREATININE 1.0 09/11/2017 0902   CALCIUM 9.8 07/16/2022 1555   CALCIUM 9.4 09/11/2017 0902   GFRNONAA >60 11/01/2018 1657   GFRAA >60 11/01/2018 1657    Lipid  Panel     Component Value Date/Time   CHOL 212 (H) 07/16/2022 1555   TRIG 225 (H) 07/16/2022 1555   HDL 59 07/16/2022 1555   CHOLHDL 3.6 07/16/2022 1555   VLDL 23.4 11/07/2018 1432   LDLCALC 119 (H) 07/16/2022 1555    CBC    Component Value Date/Time   WBC 12.8 (H) 07/16/2022 1555   RBC 4.81 07/16/2022 1555   HGB 14.5 07/16/2022 1555   HGB 13.0 09/11/2017 0902   HCT 42.5 07/16/2022 1555   HCT 38.6 09/11/2017 0902   PLT 332 07/16/2022 1555   PLT 263 09/11/2017 0902   MCV 88.4 07/16/2022 1555   MCV 88.7 09/11/2017 0902   MCH 30.1 07/16/2022 1555   MCHC 34.1 07/16/2022 1555   RDW 12.9 07/16/2022 1555   RDW 13.4 09/11/2017 0902   LYMPHSABS 2.8 11/01/2018 1657   LYMPHSABS 1.4 09/11/2017 0902   MONOABS 1.3 (H) 11/01/2018 1657   MONOABS 0.8 09/11/2017 0902   EOSABS 0.1 11/01/2018 1657   EOSABS 0.2 09/11/2017 0902   BASOSABS 0.1 11/01/2018 1657   BASOSABS 0.0 09/11/2017 0902    Hgb A1C Lab Results  Component Value Date   HGBA1C 5.9 (H) 07/16/2022            Assessment & Plan:      RTC in 3 months for your annual exam Nicki Reaper, NP

## 2023-04-04 NOTE — Patient Instructions (Signed)

## 2023-04-04 NOTE — Assessment & Plan Note (Signed)
Encourage diet and exercise for weight loss 

## 2023-04-04 NOTE — Assessment & Plan Note (Signed)
A1c today Encourage low-carb diet and exercise for weight loss 

## 2023-04-04 NOTE — Assessment & Plan Note (Signed)
Continue ibuprofen as needed Encourage regular stretching and muscle toning

## 2023-04-04 NOTE — Assessment & Plan Note (Signed)
C-Met and lipid profile today Encouraged her to consume a low-fat diet

## 2023-04-04 NOTE — Assessment & Plan Note (Signed)
Stable off meds We will monitor

## 2023-04-04 NOTE — Assessment & Plan Note (Signed)
Continue ibuprofen as needed Encourage weight loss as this can help reduce joint pain

## 2023-04-04 NOTE — Assessment & Plan Note (Signed)
In remission Getting yearly mammograms

## 2023-04-05 ENCOUNTER — Encounter: Payer: Self-pay | Admitting: Internal Medicine

## 2023-04-05 DIAGNOSIS — I10 Essential (primary) hypertension: Secondary | ICD-10-CM

## 2023-04-05 LAB — COMPLETE METABOLIC PANEL WITH GFR
AG Ratio: 1.6 (calc) (ref 1.0–2.5)
ALT: 16 U/L (ref 6–29)
AST: 14 U/L (ref 10–35)
Albumin: 4.1 g/dL (ref 3.6–5.1)
Alkaline phosphatase (APISO): 71 U/L (ref 37–153)
BUN: 12 mg/dL (ref 7–25)
CO2: 28 mmol/L (ref 20–32)
Calcium: 9.4 mg/dL (ref 8.6–10.4)
Chloride: 102 mmol/L (ref 98–110)
Creat: 0.87 mg/dL (ref 0.50–1.03)
Globulin: 2.5 g/dL (calc) (ref 1.9–3.7)
Glucose, Bld: 106 mg/dL — ABNORMAL HIGH (ref 65–99)
Potassium: 4.6 mmol/L (ref 3.5–5.3)
Sodium: 138 mmol/L (ref 135–146)
Total Bilirubin: 0.4 mg/dL (ref 0.2–1.2)
Total Protein: 6.6 g/dL (ref 6.1–8.1)
eGFR: 77 mL/min/{1.73_m2} (ref >=60)

## 2023-04-05 LAB — CBC
HCT: 43.8 % (ref 35.0–45.0)
Hemoglobin: 14.6 g/dL (ref 11.7–15.5)
MCH: 29.8 pg (ref 27.0–33.0)
MCHC: 33.3 g/dL (ref 32.0–36.0)
MCV: 89.4 fL (ref 80.0–100.0)
MPV: 11.2 fL (ref 7.5–12.5)
Platelets: 365 10*3/uL (ref 140–400)
RBC: 4.9 10*6/uL (ref 3.80–5.10)
RDW: 13 % (ref 11.0–15.0)
WBC: 10.5 10*3/uL (ref 3.8–10.8)

## 2023-04-05 LAB — HEMOGLOBIN A1C
Hgb A1c MFr Bld: 6 % of total Hgb — ABNORMAL HIGH (ref <5.7)
Mean Plasma Glucose: 126 mg/dL
eAG (mmol/L): 7 mmol/L

## 2023-04-05 LAB — LIPID PANEL
Cholesterol: 206 mg/dL — ABNORMAL HIGH (ref <200)
HDL: 62 mg/dL (ref >=50)
LDL Cholesterol (Calc): 120 mg/dL (calc) — ABNORMAL HIGH
Non-HDL Cholesterol (Calc): 144 mg/dL (calc) — ABNORMAL HIGH (ref <130)
Total CHOL/HDL Ratio: 3.3 (calc) (ref <5.0)
Triglycerides: 126 mg/dL (ref <150)

## 2023-04-05 MED ORDER — HYDROCHLOROTHIAZIDE 25 MG PO TABS: 25.0000 mg | ORAL_TABLET | Freq: Every day | ORAL | 0 refills | Status: DC

## 2023-04-05 MED ORDER — PHENTERMINE HCL 15 MG PO CAPS: 15.0000 mg | ORAL_CAPSULE | ORAL | 0 refills | Status: DC

## 2023-07-18 ENCOUNTER — Ambulatory Visit
Admission: RE | Admit: 2023-07-18 | Discharge: 2023-07-18 | Disposition: A | Discharge status: Home / Self Care | Payer: No Typology Code available for payment source | Source: Ambulatory Visit | Attending: Internal Medicine | Admitting: Internal Medicine

## 2023-07-18 ENCOUNTER — Encounter: Payer: Self-pay | Admitting: Internal Medicine

## 2023-07-18 ENCOUNTER — Ambulatory Visit
Admission: RE | Admit: 2023-07-18 | Discharge: 2023-07-18 | Disposition: A | Discharge status: Home / Self Care | Payer: No Typology Code available for payment source | Attending: Internal Medicine | Admitting: Internal Medicine

## 2023-07-18 ENCOUNTER — Ambulatory Visit (INDEPENDENT_AMBULATORY_CARE_PROVIDER_SITE_OTHER): Payer: No Typology Code available for payment source | Admitting: Internal Medicine

## 2023-07-18 VITALS — BP 136/76 | Ht 63.0 in | Wt 228.0 lb

## 2023-07-18 DIAGNOSIS — M79641 Pain in right hand: Secondary | ICD-10-CM | POA: Diagnosis present

## 2023-07-18 DIAGNOSIS — M79642 Pain in left hand: Secondary | ICD-10-CM | POA: Insufficient documentation

## 2023-07-18 DIAGNOSIS — R7303 Prediabetes: Secondary | ICD-10-CM | POA: Diagnosis not present

## 2023-07-18 DIAGNOSIS — M7989 Other specified soft tissue disorders: Secondary | ICD-10-CM

## 2023-07-18 DIAGNOSIS — R7982 Elevated C-reactive protein (CRP): Secondary | ICD-10-CM

## 2023-07-18 DIAGNOSIS — E66813 Obesity, class 3: Secondary | ICD-10-CM

## 2023-07-18 DIAGNOSIS — Z1231 Encounter for screening mammogram for malignant neoplasm of breast: Secondary | ICD-10-CM | POA: Diagnosis not present

## 2023-07-18 DIAGNOSIS — E7800 Pure hypercholesterolemia, unspecified: Secondary | ICD-10-CM | POA: Diagnosis not present

## 2023-07-18 DIAGNOSIS — Z6841 Body Mass Index (BMI) 40.0 and over, adult: Secondary | ICD-10-CM

## 2023-07-18 DIAGNOSIS — Z0001 Encounter for general adult medical examination with abnormal findings: Secondary | ICD-10-CM

## 2023-07-18 NOTE — Assessment & Plan Note (Signed)
Encouraged diet and exercise for weight loss ?

## 2023-07-18 NOTE — Patient Instructions (Signed)
Health Maintenance for Postmenopausal Women Menopause is a normal process in which your ability to get pregnant comes to an end. This process happens slowly over many months or years, usually between the ages of 48 and 55. Menopause is complete when you have missed your menstrual period for 12 months. It is important to talk with your health care provider about some of the most common conditions that affect women after menopause (postmenopausal women). These include heart disease, cancer, and bone loss (osteoporosis). Adopting a healthy lifestyle and getting preventive care can help to promote your health and wellness. The actions you take can also lower your chances of developing some of these common conditions. What are the signs and symptoms of menopause? During menopause, you may have the following symptoms: Hot flashes. These can be moderate or severe. Night sweats. Decrease in sex drive. Mood swings. Headaches. Tiredness (fatigue). Irritability. Memory problems. Problems falling asleep or staying asleep. Talk with your health care provider about treatment options for your symptoms. Do I need hormone replacement therapy? Hormone replacement therapy is effective in treating symptoms that are caused by menopause, such as hot flashes and night sweats. Hormone replacement carries certain risks, especially as you become older. If you are thinking about using estrogen or estrogen with progestin, discuss the benefits and risks with your health care provider. How can I reduce my risk for heart disease and stroke? The risk of heart disease, heart attack, and stroke increases as you age. One of the causes may be a change in the body's hormones during menopause. This can affect how your body uses dietary fats, triglycerides, and cholesterol. Heart attack and stroke are medical emergencies. There are many things that you can do to help prevent heart disease and stroke. Watch your blood pressure High  blood pressure causes heart disease and increases the risk of stroke. This is more likely to develop in people who have high blood pressure readings or are overweight. Have your blood pressure checked: Every 3-5 years if you are 18-39 years of age. Every year if you are 40 years old or older. Eat a healthy diet  Eat a diet that includes plenty of vegetables, fruits, low-fat dairy products, and lean protein. Do not eat a lot of foods that are high in solid fats, added sugars, or sodium. Get regular exercise Get regular exercise. This is one of the most important things you can do for your health. Most adults should: Try to exercise for at least 150 minutes each week. The exercise should increase your heart rate and make you sweat (moderate-intensity exercise). Try to do strengthening exercises at least twice each week. Do these in addition to the moderate-intensity exercise. Spend less time sitting. Even light physical activity can be beneficial. Other tips Work with your health care provider to achieve or maintain a healthy weight. Do not use any products that contain nicotine or tobacco. These products include cigarettes, chewing tobacco, and vaping devices, such as e-cigarettes. If you need help quitting, ask your health care provider. Know your numbers. Ask your health care provider to check your cholesterol and your blood sugar (glucose). Continue to have your blood tested as directed by your health care provider. Do I need screening for cancer? Depending on your health history and family history, you may need to have cancer screenings at different stages of your life. This may include screening for: Breast cancer. Cervical cancer. Lung cancer. Colorectal cancer. What is my risk for osteoporosis? After menopause, you may be   at increased risk for osteoporosis. Osteoporosis is a condition in which bone destruction happens more quickly than new bone creation. To help prevent osteoporosis or  the bone fractures that can happen because of osteoporosis, you may take the following actions: If you are 19-50 years old, get at least 1,000 mg of calcium and at least 600 international units (IU) of vitamin D per day. If you are older than age 50 but younger than age 70, get at least 1,200 mg of calcium and at least 600 international units (IU) of vitamin D per day. If you are older than age 70, get at least 1,200 mg of calcium and at least 800 international units (IU) of vitamin D per day. Smoking and drinking excessive alcohol increase the risk of osteoporosis. Eat foods that are rich in calcium and vitamin D, and do weight-bearing exercises several times each week as directed by your health care provider. How does menopause affect my mental health? Depression may occur at any age, but it is more common as you become older. Common symptoms of depression include: Feeling depressed. Changes in sleep patterns. Changes in appetite or eating patterns. Feeling an overall lack of motivation or enjoyment of activities that you previously enjoyed. Frequent crying spells. Talk with your health care provider if you think that you are experiencing any of these symptoms. General instructions See your health care provider for regular wellness exams and vaccines. This may include: Scheduling regular health, dental, and eye exams. Getting and maintaining your vaccines. These include: Influenza vaccine. Get this vaccine each year before the flu season begins. Pneumonia vaccine. Shingles vaccine. Tetanus, diphtheria, and pertussis (Tdap) booster vaccine. Your health care provider may also recommend other immunizations. Tell your health care provider if you have ever been abused or do not feel safe at home. Summary Menopause is a normal process in which your ability to get pregnant comes to an end. This condition causes hot flashes, night sweats, decreased interest in sex, mood swings, headaches, or lack  of sleep. Treatment for this condition may include hormone replacement therapy. Take actions to keep yourself healthy, including exercising regularly, eating a healthy diet, watching your weight, and checking your blood pressure and blood sugar levels. Get screened for cancer and depression. Make sure that you are up to date with all your vaccines. This information is not intended to replace advice given to you by your health care provider. Make sure you discuss any questions you have with your health care provider. Document Revised: 01/23/2021 Document Reviewed: 01/23/2021 [employer] Patient Education  2024 [employer]  

## 2023-07-18 NOTE — Progress Notes (Signed)
Subjective:    Patient ID: Stacy [name], female    DOB: September 22, 1960, 62 y.o.   MRN: [id number]  HPI  Patient presents to clinic today for her annual exam.  She also reports bilateral hand pain.  She reports this started a few weeks ago.  At times she has difficulty gripping things.  She has not reported dropping things.  She denies numbness, tingling or weakness of her hands.  Flu: 05/2015 Tetanus: 06/2022 COVID: Never Shingrix: Never Pap smear: 06/2022 Mammogram: 08/2022 Bone density: 12/2022 Colon screening: 12/2021, Cologuard Vision screening: as needed Dentist: as needed  Diet: She does eat some meat. She consumes fruits and veggies. She does eat some fried foods. She drinks mostly water and coffee. Exercise: None  Review of Systems     Past Medical History:  Diagnosis Date   Anxiety    Arthritis    Breast cancer (HCC)    Cancer (HCC)    Complication of anesthesia    DIFFICULTY WAKING UP , LAST SURGERY NECK 2012 WAS FINE PER PT HERE AT [facility]   Depression    Fibromyalgia    Genetic testing 12/21/2016   Genetic testing reported out on 12/20/2016 through [facility]'s 46-gene Common Hereditary Cancers panel found no deleterious mutations. The following 46 genes were analyzed: APC, ATM, AXIN2, BARD1, BMPR1A, BRCA1, BRCA2, BRIP1, CDH1, CDKN2A, CHEK2, CTNNA1, DICER1, EPCAM, GREM1, KIT, MEN1, MLH1, MSH2, MSH3, MSH6, MUTYH, NBN, NF1, NTHL1, PALB2, PDGFRA, PMS2, POLD1, POLE, PTEN, RAD50, RAD51C, RAD51D, SDH   Headache    HX MIGRAINES     Heart murmur    History of radiation therapy 02/14/17-04/03/2017   left breast 60.4 Gy: 50.4 Gy delivered in 28 fractions and a boost of 10 Gy delivered in 5 fractions.   Hypertension    Malignant neoplasm of upper-outer quadrant of left female breast (HCC) 08/23/2016   Mitral valve disorder    MVP (mitral valve prolapse)    AS INFANT   Neck pain    Personal history of chemotherapy    Personal history of radiation therapy    Vision  abnormalities     Current Outpatient Medications  Medication Sig Dispense Refill   hydrochlorothiazide (HYDRODIURIL) 25 MG tablet Take 1 tablet (25 mg total) by mouth daily. 90 tablet 0   ibuprofen (ADVIL,MOTRIN) 200 MG tablet Take 400 mg by mouth every 6 (six) hours as needed for mild pain.     phentermine 15 MG capsule Take 1 capsule (15 mg total) by mouth every morning. 30 capsule 0   No current facility-administered medications for this visit.    Allergies  Allergen Reactions   Gabapentin Swelling    "makes me feel detached"   Naproxen Palpitations   Neulasta [Pegfilgrastim] Itching and Other (See Comments)    Patient stated,"reddish skin tone, body on fire, tingly tongue."   Erythromycin Nausea And Vomiting   Gabapentin Other (See Comments)    "Makes me feel out of my body"   Neulasta [Pegfilgrastim] Itching   Tramadol Other (See Comments)    headache   Erythromycin Nausea And Vomiting   Naproxen Palpitations   Tramadol Hcl Other (See Comments)    Headaches    Family History  Adopted: Yes  Problem Relation Age of Onset   Ehlers-Danlos syndrome Sister    Breast cancer Brother 42       Maternal half-brother. Currently 59.   BRCA 1/2 Neg Hx     Social History   Socioeconomic History   Marital status:  Married    Spouse name: Not on file   Number of children: 4   Years of education: Not on file   Highest education level: Some college, no degree  Occupational History   Not on file  Tobacco Use   Smoking status: Every Day    Current packs/day: 0.50    Types: Cigarettes   Smokeless tobacco: Never  Vaping Use   Vaping status: Never Used  Substance and Sexual Activity   Alcohol use: Never   Drug use: Not Currently   Sexual activity: Yes    Birth control/protection: Post-menopausal  Other Topics Concern   Not on file  Social History Narrative   ** Merged History Encounter **       Social Determinants of Health   Financial Resource Strain: Low Risk   (07/17/2023)   Overall Financial Resource Strain (CARDIA)    Difficulty of Paying Living Expenses: Not very hard  Food Insecurity: Food Insecurity Present (07/17/2023)   Hunger Vital Sign    Worried About Running Out of Food in the Last Year: Sometimes true    Ran Out of Food in the Last Year: Sometimes true  Transportation Needs: No Transportation Needs (07/17/2023)   PRAPARE - Administrator, Civil Service (Medical): No    Lack of Transportation (Non-Medical): No  Physical Activity: Insufficiently Active (07/17/2023)   Exercise Vital Sign    Days of Exercise per Week: 1 day    Minutes of Exercise per Session: 20 min  Stress: Stress Concern Present (07/17/2023)   [employer] of Occupational Health - Occupational Stress Questionnaire    Feeling of Stress : Very much  Social Connections: Socially Isolated (07/17/2023)   Social Connection and Isolation Panel [NHANES]    Frequency of Communication with Friends and Family: More than three times a week    Frequency of Social Gatherings with Friends and Family: Once a week    Attends Religious Services: Never    Database administrator or Organizations: No    Attends Engineer, structural: Not on file    Marital Status: Separated  Intimate Partner Violence: Not on file     Constitutional: Denies fever, malaise, fatigue, headache or abrupt weight changes.  HEENT: Denies eye pain, eye redness, ear pain, ringing in the ears, wax buildup, runny nose, nasal congestion, bloody nose, or sore throat. Respiratory: Denies difficulty breathing, shortness of breath, cough or sputum production.   Cardiovascular: Denies chest pain, chest tightness, palpitations or swelling in the hands or feet.  Gastrointestinal: Denies abdominal pain, bloating, constipation, diarrhea or blood in the stool.  GU: Pt reports mixed incontinence. Denies urgency, frequency, pain with urination, burning sensation, blood in urine, odor or  discharge. Musculoskeletal: Patient reports chronic joint and muscle pain, bilateral hand pain, swelling of left bicep.  Denies decrease in range of motion, difficulty with gait, or joint swelling.  Skin: Denies redness, rashes, lesions or ulcercations.  Neurological: Denies dizziness, difficulty with memory, difficulty with speech or problems with balance and coordination.  Psych: Denies anxiety, depression, SI/HI.  No other specific complaints in a complete review of systems (except as listed in HPI above).  Objective:   Physical Exam  BP (!) 142/84   Ht 5\' 3"  (1.6 m)   Wt 228 lb (103.4 kg)   LMP 08/29/2016   BMI 40.39 kg/m   Wt Readings from Last 3 Encounters:  04/04/23 224 lb (101.6 kg)  07/16/22 232 lb (105.2 kg)  03/26/22 229 lb  1.6 oz (103.9 kg)    General: Appears her stated age, obese, in NAD. Skin: Warm, dry and intact.  HEENT: Head: normal shape and size; Eyes: sclera white, no icterus, conjunctiva pink, PERRLA and EOMs intact;  Neck:  Neck supple, trachea midline. No masses, lumps or thyromegaly present.  Cardiovascular: Normal rate and rhythm. S1,S2 noted.  No murmur, rubs or gallops noted. No JVD or BLE edema. No carotid bruits noted. Pulmonary/Chest: Normal effort and positive vesicular breath sounds. No respiratory distress. No wheezes, rales or ronchi noted.  Abdomen: Soft and nontender. Normal bowel sounds.  Musculoskeletal: Mass noted of the left bicep concerning for lipoma.  Normal flexion and extension of the fingers.  She has a nodule noted of her left index finger.  Strength 4/5 BUE, 5/5 BLE. No difficulty with gait.  Neurological: Alert and oriented. Cranial nerves II-XII grossly intact. Coordination normal.  Psychiatric: Mood and affect normal. Behavior is normal. Judgment and thought content normal.    BMET    Component Value Date/Time   NA 138 04/04/2023 1012   NA 138 09/11/2017 0902   K 4.6 04/04/2023 1012   K 3.5 09/11/2017 0902   CL 102  04/04/2023 1012   CO2 28 04/04/2023 1012   CO2 25 09/11/2017 0902   GLUCOSE 106 (H) 04/04/2023 1012   GLUCOSE 98 09/11/2017 0902   BUN 12 04/04/2023 1012   BUN 18.2 09/11/2017 0902   CREATININE 0.87 04/04/2023 1012   CREATININE 1.0 09/11/2017 0902   CALCIUM 9.4 04/04/2023 1012   CALCIUM 9.4 09/11/2017 0902   GFRNONAA >60 11/01/2018 1657   GFRAA >60 11/01/2018 1657    Lipid Panel     Component Value Date/Time   CHOL 206 (H) 04/04/2023 1012   TRIG 126 04/04/2023 1012   HDL 62 04/04/2023 1012   CHOLHDL 3.3 04/04/2023 1012   VLDL 23.4 11/07/2018 1432   LDLCALC 120 (H) 04/04/2023 1012    CBC    Component Value Date/Time   WBC 10.5 04/04/2023 1012   RBC 4.90 04/04/2023 1012   HGB 14.6 04/04/2023 1012   HGB 13.0 09/11/2017 0902   HCT 43.8 04/04/2023 1012   HCT 38.6 09/11/2017 0902   PLT 365 04/04/2023 1012   PLT 263 09/11/2017 0902   MCV 89.4 04/04/2023 1012   MCV 88.7 09/11/2017 0902   MCH 29.8 04/04/2023 1012   MCHC 33.3 04/04/2023 1012   RDW 13.0 04/04/2023 1012   RDW 13.4 09/11/2017 0902   LYMPHSABS 2.8 11/01/2018 1657   LYMPHSABS 1.4 09/11/2017 0902   MONOABS 1.3 (H) 11/01/2018 1657   MONOABS 0.8 09/11/2017 0902   EOSABS 0.1 11/01/2018 1657   EOSABS 0.2 09/11/2017 0902   BASOSABS 0.1 11/01/2018 1657   BASOSABS 0.0 09/11/2017 0902    Hgb A1C Lab Results  Component Value Date   HGBA1C 6.0 (H) 04/04/2023            Assessment & Plan:   Preventative health maintenance:  Flu shot declined Tetanus UTD Encourage her to get her COVID-vaccine Discussed Shingrix vaccine, she will check coverage with her insurance company and schedule visit if she would like to have this done Pap smear UTD Mammogram ordered-she will call to schedule Bone density UTD Colon screening UTD Encouraged her to consume a balanced diet and exercise regimen Advised her to see an eye doctor and dentist annually We will check CBC, c-Met, lipid, A1c today  Bilateral hand  pain:  Will check ESR, CRP, ANA and rheumatoid factor  X-ray bilateral hands today  Mass of left upper extremity:  Concerning for lipoma however given her history of lumpectomy and lymph node dissection on that side we will obtain ultrasound for further evaluation  RTC in 6 months, follow-up chronic conditions Nicki Reaper, NP

## 2023-07-19 ENCOUNTER — Other Ambulatory Visit: Payer: Self-pay | Admitting: Internal Medicine

## 2023-07-19 ENCOUNTER — Encounter: Payer: Self-pay | Admitting: Internal Medicine

## 2023-07-19 DIAGNOSIS — N644 Mastodynia: Secondary | ICD-10-CM

## 2023-07-19 DIAGNOSIS — Z1231 Encounter for screening mammogram for malignant neoplasm of breast: Secondary | ICD-10-CM

## 2023-07-19 LAB — CBC
HCT: 44.3 % (ref 35.0–45.0)
Hemoglobin: 14.8 g/dL (ref 11.7–15.5)
MCH: 30.3 pg (ref 27.0–33.0)
MCHC: 33.4 g/dL (ref 32.0–36.0)
MCV: 90.6 fL (ref 80.0–100.0)
MPV: 11.4 fL (ref 7.5–12.5)
Platelets: 344 10*3/uL (ref 140–400)
RBC: 4.89 10*6/uL (ref 3.80–5.10)
RDW: 12.5 % (ref 11.0–15.0)
WBC: 10.4 10*3/uL (ref 3.8–10.8)

## 2023-07-19 LAB — LIPID PANEL
Cholesterol: 177 mg/dL (ref <200)
HDL: 58 mg/dL (ref >=50)
LDL Cholesterol (Calc): 97 mg/dL
Non-HDL Cholesterol (Calc): 119 mg/dL (ref <130)
Total CHOL/HDL Ratio: 3.1 (calc) (ref <5.0)
Triglycerides: 119 mg/dL (ref <150)

## 2023-07-19 LAB — HEMOGLOBIN A1C
Hgb A1c MFr Bld: 6 %{Hb} — ABNORMAL HIGH (ref <5.7)
Mean Plasma Glucose: 126 mg/dL
eAG (mmol/L): 7 mmol/L

## 2023-07-19 LAB — COMPLETE METABOLIC PANEL WITH GFR
AG Ratio: 1.6 (calc) (ref 1.0–2.5)
ALT: 13 U/L (ref 6–29)
AST: 18 U/L (ref 10–35)
Albumin: 4.3 g/dL (ref 3.6–5.1)
Alkaline phosphatase (APISO): 77 U/L (ref 37–153)
BUN: 16 mg/dL (ref 7–25)
CO2: 27 mmol/L (ref 20–32)
Calcium: 9.5 mg/dL (ref 8.6–10.4)
Chloride: 100 mmol/L (ref 98–110)
Creat: 0.92 mg/dL (ref 0.50–1.03)
Globulin: 2.7 g/dL (ref 1.9–3.7)
Glucose, Bld: 105 mg/dL — ABNORMAL HIGH (ref 65–99)
Potassium: 4.3 mmol/L (ref 3.5–5.3)
Sodium: 136 mmol/L (ref 135–146)
Total Bilirubin: 0.4 mg/dL (ref 0.2–1.2)
Total Protein: 7 g/dL (ref 6.1–8.1)
eGFR: 72 mL/min/{1.73_m2} (ref >=60)

## 2023-07-19 LAB — RHEUMATOID FACTOR: Rheumatoid fact SerPl-aCnc: <10 [IU]/mL (ref <14)

## 2023-07-19 LAB — ANA: Anti Nuclear Antibody (ANA): NEGATIVE

## 2023-07-19 LAB — SEDIMENTATION RATE: Sed Rate: 17 mm/h (ref 0–30)

## 2023-07-19 LAB — C-REACTIVE PROTEIN: CRP: 11.6 mg/L — ABNORMAL HIGH (ref <8.0)

## 2023-07-22 NOTE — Addendum Note (Signed)
Addended by: Lorre Munroe on: 07/22/2023 11:30 AM   Modules accepted: Orders

## 2023-07-24 ENCOUNTER — Encounter: Payer: Self-pay | Admitting: Internal Medicine

## 2023-07-25 ENCOUNTER — Ambulatory Visit
Admission: RE | Admit: 2023-07-25 | Discharge: 2023-07-25 | Disposition: A | Discharge status: Home / Self Care | Payer: No Typology Code available for payment source | Source: Ambulatory Visit | Attending: Internal Medicine | Admitting: Internal Medicine

## 2023-07-25 DIAGNOSIS — M7989 Other specified soft tissue disorders: Secondary | ICD-10-CM | POA: Insufficient documentation

## 2023-08-06 ENCOUNTER — Other Ambulatory Visit: Payer: No Typology Code available for payment source

## 2023-08-06 DIAGNOSIS — R7982 Elevated C-reactive protein (CRP): Secondary | ICD-10-CM

## 2023-08-07 ENCOUNTER — Encounter: Payer: Self-pay | Admitting: Internal Medicine

## 2023-08-07 DIAGNOSIS — R7982 Elevated C-reactive protein (CRP): Secondary | ICD-10-CM

## 2023-08-07 DIAGNOSIS — M79641 Pain in right hand: Secondary | ICD-10-CM

## 2023-08-07 LAB — C-REACTIVE PROTEIN: CRP: 7.2 mg/L (ref <8.0)

## 2023-08-21 ENCOUNTER — Ambulatory Visit
Admission: RE | Admit: 2023-08-21 | Discharge: 2023-08-21 | Disposition: A | Discharge status: Home / Self Care | Payer: No Typology Code available for payment source | Source: Ambulatory Visit | Attending: Internal Medicine | Admitting: Internal Medicine

## 2023-08-21 DIAGNOSIS — N644 Mastodynia: Secondary | ICD-10-CM

## 2023-08-29 ENCOUNTER — Other Ambulatory Visit: Payer: Self-pay | Admitting: Internal Medicine

## 2023-08-29 DIAGNOSIS — I10 Essential (primary) hypertension: Secondary | ICD-10-CM

## 2023-08-29 NOTE — Telephone Encounter (Signed)
Requested Prescriptions  Pending Prescriptions Disp Refills   hydrochlorothiazide (HYDRODIURIL) 25 MG tablet [Pharmacy Med Name: HYDROCHLOROTHIAZIDE 25 MG TAB] 90 tablet 1    Sig: TAKE 1 TABLET (25 MG TOTAL) BY MOUTH DAILY.     Cardiovascular: Diuretics - Thiazide Passed - 08/29/2023 11:59 AM      Passed - Cr in normal range and within 180 days    Creatinine  Date Value Ref Range Status  09/11/2017 1.0 0.6 - 1.1 mg/dL Final   Creat  Date Value Ref Range Status  07/18/2023 0.92 0.50 - 1.03 mg/dL Final         Passed - K in normal range and within 180 days    Potassium  Date Value Ref Range Status  07/18/2023 4.3 3.5 - 5.3 mmol/L Final  09/11/2017 3.5 3.5 - 5.1 mEq/L Final         Passed - Na in normal range and within 180 days    Sodium  Date Value Ref Range Status  07/18/2023 136 135 - 146 mmol/L Final  09/11/2017 138 136 - 145 mEq/L Final         Passed - Last BP in normal range    BP Readings from Last 1 Encounters:  07/18/23 136/76         Passed - Valid encounter within last 6 months    Recent Outpatient Visits           1 month ago Encounter for general adult medical examination with abnormal findings   [place] [facility] [place], Salvadore Oxford, NP   4 months ago Prediabetes   [place] [facility] [place], Salvadore Oxford, NP   1 year ago Encounter for general adult medical examination with abnormal findings   [place] [facility] [place], Salvadore Oxford, NP   1 year ago Encounter for completion of form with patient   [place] [facility] [place], Salvadore Oxford, NP   1 year ago Chronic midline low back pain with bilateral sciatica   [place] [facility] [place], Salvadore Oxford, NP       Future Appointments             In 2 months Pollyann Savoy, MD [facility] Rheumatology - A Dept Of [place]   In 4 months [place], Salvadore Oxford, NP [place] [facility] [place]

## 2023-10-03 NOTE — Progress Notes (Signed)
Office Visit Note  Patient: Stacy [name]             Date of Birth: May 24, 1961           MRN: [id number]             PCP: Lorre Munroe, NP Referring: Lorre Munroe, NP Visit Date: 10/17/2023 Occupation: @GUAROCC @  Subjective:  Pain in joints and muscles.  History of Present Illness: Stacy [name] is a 63 y.o. female seen in consultation per request of her PCP.  According the patient her symptoms started in 2007 with generalized pain at the time she was diagnosed with fibromyalgia syndrome.  In 2012 she started having neck pain and was diagnosed with disc disease of the cervical spine.  She underwent cervical spine fusion by Dr. Channing Mutters.  She has off-and-on discomfort in her cervical spine.  She also has lower back pain for the last 10 years.  She states 2 years ago her lower back pain got worse and she was diagnosed with disc disease of the lumbar spine along with spinal stenosis.  She goes to [place] [facility] and gets injections there.  She recalls having a right shoulder joint surgery for rotator cuff tear repair in the past.  She has intermittent discomfort in her right shoulder.  She had right total hip replacement for osteoarthritis of the hip in 2016.  She still have some intermittent discomfort in her right hip.  She states in September 2024 she started having discomfort in her hands and feet with the spasm.  She states that she had to massage her hands to relieve discomfort.  She denies any history of paresthesias or swelling.  She said the pain has eased off but she still is in discomfort in her hands and feet.  None of the other joints are painful.  She denies any history of joint swelling.  There is no personal or family history of psoriasis.  She is adopted.  She states that her half-sister has EDS and osteoarthritis.  She is gravida 4, para 4.  There is no history of preeclampsia or DVTs.  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  She takes care of her grandchildren in  her spare time.  She does not have much time for exercise.    Activities of Daily Living:  Patient reports morning stiffness for 1 hour.   Patient Reports nocturnal pain.  Difficulty dressing/grooming: Reports Difficulty climbing stairs: Reports Difficulty getting out of chair: Reports Difficulty using hands for taps, buttons, cutlery, and/or writing: Reports  Review of Systems  Constitutional:  Positive for fatigue.  HENT:  Negative for mouth sores and mouth dryness.   Eyes:  Negative for pain and dryness.  Respiratory:  Negative for shortness of breath and difficulty breathing.   Cardiovascular:  Positive for palpitations. Negative for chest pain.  Gastrointestinal:  Negative for blood in stool, constipation and diarrhea.  Endocrine: Positive for increased urination.  Genitourinary:  Positive for involuntary urination.  Musculoskeletal:  Positive for joint pain, gait problem, joint pain, myalgias, muscle weakness, morning stiffness, muscle tenderness and myalgias. Negative for joint swelling.  Skin:  Negative for color change, rash, hair loss and sensitivity to sunlight.  Allergic/Immunologic: Negative for susceptible to infections.  Neurological:  Positive for dizziness and headaches.  Hematological:  Negative for swollen glands.  Psychiatric/Behavioral:  Positive for sleep disturbance. Negative for depressed mood. The patient is not nervous/anxious.     PMFS History:  Patient  Active Problem List   Diagnosis Date Noted   HLD (hyperlipidemia) 04/13/2021   Prediabetes 04/13/2021   Class 3 severe obesity due to excess calories with body mass index (BMI) of 40.0 to 44.9 in adult [facility]) 04/13/2021   Port catheter in place 10/09/2016   History of left breast cancer 08/23/2016   Anxiety and depression 04/20/2016   Arthritis 06/27/2015   Essential hypertension 04/19/2009   Fibromyalgia 02/18/2008    Past Medical History:  Diagnosis Date   Anxiety    Arthritis    Breast cancer  (HCC)    Cancer (HCC)    Cataract    left eye, per patient   Complication of anesthesia    DIFFICULTY WAKING UP , LAST SURGERY NECK 2012 WAS FINE PER PT HERE AT [facility]   Depression    Fibromyalgia    Genetic testing 12/21/2016   Genetic testing reported out on 12/20/2016 through [facility]'s 46-gene Common Hereditary Cancers panel found no deleterious mutations. The following 46 genes were analyzed: APC, ATM, AXIN2, BARD1, BMPR1A, BRCA1, BRCA2, BRIP1, CDH1, CDKN2A, CHEK2, CTNNA1, DICER1, EPCAM, GREM1, KIT, MEN1, MLH1, MSH2, MSH3, MSH6, MUTYH, NBN, NF1, NTHL1, PALB2, PDGFRA, PMS2, POLD1, POLE, PTEN, RAD50, RAD51C, RAD51D, SDH   Headache    HX MIGRAINES     Heart murmur    History of radiation therapy 02/14/17-04/03/2017   left breast 60.4 Gy: 50.4 Gy delivered in 28 fractions and a boost of 10 Gy delivered in 5 fractions.   Hypertension    Macular degeneration    right eye, per patient   Malignant neoplasm of upper-outer quadrant of left female breast (HCC) 08/23/2016   Mitral valve disorder    MVP (mitral valve prolapse)    AS INFANT   Neck pain    Personal history of chemotherapy    Personal history of radiation therapy    Vision abnormalities     Family History  Adopted: Yes  Problem Relation Age of Onset   Ehlers-Danlos syndrome Sister    Breast cancer Brother 61       unknown   Healthy Son    Healthy Son    Healthy Son    Hypertension Son    Healthy Son    BRCA 1/2 Neg Hx    Past Surgical History:  Procedure Laterality Date   BREAST BIOPSY Left 2017   BREAST LUMPECTOMY Left 2017   cervical fusiion  2012   JOINT REPLACEMENT     MITRAL VALVE REPAIR     MVP 1970   PORT-A-CATH REMOVAL N/A 10/23/2017   Procedure: REMOVAL PORT-A-CATH;  Surgeon: Almond Lint, MD;  Location: [facility];  Service: General;  Laterality: N/A;   PORTACATH PLACEMENT Right 09/06/2016   Procedure: INSERTION PORT-A-CATH;  Surgeon: Almond Lint, MD;  Location: [facility];  Service: General;   Laterality: Right;   RADIOACTIVE SEED GUIDED PARTIAL MASTECTOMY WITH AXILLARY SENTINEL LYMPH NODE BIOPSY Left 09/06/2016   Procedure: RADIOACTIVE SEED GUIDED LEFT BREAST LUMPECTOMY  WITH SENTINEL LYMPH NODE BIOPSY;  Surgeon: Almond Lint, MD;  Location: [facility];  Service: General;  Laterality: Left;, Per patient just "lumpectomy"   ROTATOR CUFF REPAIR Right 2012   TOTAL HIP ARTHROPLASTY Right 09/14/2015   Procedure: RIGHT TOTAL HIP ARTHROPLASTY ANTERIOR APPROACH;  Surgeon: Tarry Kos, MD;  Location: [facility] OR;  Service: Orthopedics;  Laterality: Right;   TUBAL LIGATION  1988   Social History   Social History Narrative   ** Merged History Encounter **  Immunization History  Administered Date(s) Administered   Influenza,inj,Quad PF,6+ Mos 05/19/2015   PPD Test 11/01/2014   Tdap 10/19/2010, 07/16/2022     Objective: Vital Signs: BP (!) 150/89 (BP Location: Right Arm, Patient Position: Sitting, Cuff Size: Large)   Pulse 79   Resp 16   Ht 5\' 3"  (1.6 m)   Wt 232 lb 3.2 oz (105.3 kg)   LMP 08/29/2016   BMI 41.13 kg/m    Physical Exam Vitals and nursing note reviewed.  Constitutional:      Appearance: She is well-developed.  HENT:     Head: Normocephalic and atraumatic.  Eyes:     Conjunctiva/sclera: Conjunctivae normal.  Cardiovascular:     Rate and Rhythm: Normal rate and regular rhythm.     Heart sounds: Normal heart sounds.  Pulmonary:     Effort: Pulmonary effort is normal.     Breath sounds: Normal breath sounds.  Abdominal:     General: Bowel sounds are normal.     Palpations: Abdomen is soft.  Musculoskeletal:     Cervical back: Normal range of motion.  Lymphadenopathy:     Cervical: No cervical adenopathy.  Skin:    General: Skin is warm and dry.     Capillary Refill: Capillary refill takes less than 2 seconds.  Neurological:     Mental Status: She is alert and oriented to person, place, and time.  Psychiatric:        Behavior: Behavior  normal.      Musculoskeletal Exam: Patient had limited range of motion of the cervical spine without any discomfort.  She had limited painful range of motion of the lumbar spine.  She had good range of motion of bilateral shoulders without discomfort.  Elbow joints and wrist joints were in good range of motion with no synovitis.  She had bilateral PIP and DIP thickening with subluxation of her left index finger DIP joint.  She had limited range of motion of the right hip joint which was replaced.  The left hip joint was in good range of motion.  Bilateral knee joints with good range of motion without any warmth swelling or effusion.  There was no tenderness over ankles or MTPs.  First MTP thickening was noted with no synovitis.  CDAI Exam: CDAI Score: -- Patient Global: --; Provider Global: -- Swollen: --; Tender: -- Joint Exam 10/17/2023   No joint exam has been documented for this visit   There is currently no information documented on the homunculus. Go to the Rheumatology activity and complete the homunculus joint exam.  Investigation: No additional findings.  Imaging: No results found.  Recent Labs: Lab Results  Component Value Date   WBC 10.4 07/18/2023   HGB 14.8 07/18/2023   PLT 344 07/18/2023   NA 136 07/18/2023   K 4.3 07/18/2023   CL 100 07/18/2023   CO2 27 07/18/2023   GLUCOSE 105 (H) 07/18/2023   BUN 16 07/18/2023   CREATININE 0.92 07/18/2023   BILITOT 0.4 07/18/2023   ALKPHOS 64 11/01/2018   AST 18 07/18/2023   ALT 13 07/18/2023   PROT 7.0 07/18/2023   ALBUMIN 4.6 11/01/2018   CALCIUM 9.5 07/18/2023   GFRAA >60 11/01/2018   April 04, 2023 lipid panel LDL 120 July 18, 2023 hemoglobin A1c 6.0, ESR 17, CRP 11.6, RF negative, ANA negative August 06, 2023 CRP 7.2 Speciality Comments: No specialty comments available.  Procedures:  No procedures performed Allergies: Gabapentin, Naproxen, Neulasta [pegfilgrastim], Erythromycin, Gabapentin, Neulasta  [  pegfilgrastim], Tramadol, Erythromycin, Naproxen, and Tramadol hcl   Assessment / Plan:     Visit Diagnoses: Pain in both hands -patient complains of pain and discomfort in the bilateral hands.  No synovitis was noted.  Bilateral PIP and DIP thickening was noted.  I reviewed her previous x-rays which were suggestive of osteoarthritis.  Joint protection muscle strengthening was discussed.  I offered physical therapy and Occupational Therapy which she declined.  A handout on hand exercises was given.  ANA negative, RF negative, sed rate normal.  Enchondroma of bone of hand, right-enchondroma was noted in the right ring finger middle phalanx.  I advised patient to schedule an appointment with Dr. Roda Shutters.  Chronic right shoulder pain - Rotator cuff tear repair 2012.  She continues to have chronic discomfort.  Status post total hip replacement, right - 2016 by Dr. Roda Shutters.  She had limited range of motion with some discomfort.  Pain in both feet-she had been told first MTP thickening with no synovitis.  DDD (degenerative disc disease), cervical - s/p post fusion 2012.  She has limited range of motion without any discomfort today.  Lumbar spondylosis - With a spinal stenosis.  She continues to have pain and discomfort in her lumbar spine.  She is followed at [facility] in [place].  She gets epidural injections.  Fibromyalgia - dxd 2007.  She continues to have generalized pain and discomfort.  She had hyperalgesia and positive tender points.  Need for regular exercise was emphasized.  History of left breast cancer - 2017 partial mastectomy, CTX, RTX  Essential hypertension  H/O mitral valve repair  Prediabetes  Anxiety and depression  Orders: No orders of the defined types were placed in this encounter.  No orders of the defined types were placed in this encounter.    Follow-Up Instructions: Return if symptoms worsen or fail to improve, for Polyarthralgia.   Pollyann Savoy,  MD  Note - This record has been created using Animal nutritionist.  Chart creation errors have been sought, but may not always  have been located. Such creation errors do not reflect on  the standard of medical care.

## 2023-10-17 ENCOUNTER — Encounter: Payer: Self-pay | Admitting: Rheumatology

## 2023-10-17 ENCOUNTER — Ambulatory Visit: Payer: No Typology Code available for payment source | Attending: Rheumatology | Admitting: Rheumatology

## 2023-10-17 VITALS — BP 150/89 | HR 79 | Resp 16 | Ht 63.0 in | Wt 232.2 lb

## 2023-10-17 DIAGNOSIS — M79672 Pain in left foot: Secondary | ICD-10-CM

## 2023-10-17 DIAGNOSIS — M79641 Pain in right hand: Secondary | ICD-10-CM

## 2023-10-17 DIAGNOSIS — Z96641 Presence of right artificial hip joint: Secondary | ICD-10-CM

## 2023-10-17 DIAGNOSIS — F32A Depression, unspecified: Secondary | ICD-10-CM

## 2023-10-17 DIAGNOSIS — M5030 Other cervical disc degeneration, unspecified cervical region: Secondary | ICD-10-CM

## 2023-10-17 DIAGNOSIS — M797 Fibromyalgia: Secondary | ICD-10-CM

## 2023-10-17 DIAGNOSIS — Z853 Personal history of malignant neoplasm of breast: Secondary | ICD-10-CM

## 2023-10-17 DIAGNOSIS — M25511 Pain in right shoulder: Secondary | ICD-10-CM | POA: Diagnosis not present

## 2023-10-17 DIAGNOSIS — F419 Anxiety disorder, unspecified: Secondary | ICD-10-CM

## 2023-10-17 DIAGNOSIS — E782 Mixed hyperlipidemia: Secondary | ICD-10-CM

## 2023-10-17 DIAGNOSIS — M47816 Spondylosis without myelopathy or radiculopathy, lumbar region: Secondary | ICD-10-CM

## 2023-10-17 DIAGNOSIS — Z9889 Other specified postprocedural states: Secondary | ICD-10-CM

## 2023-10-17 DIAGNOSIS — M79642 Pain in left hand: Secondary | ICD-10-CM

## 2023-10-17 DIAGNOSIS — G8929 Other chronic pain: Secondary | ICD-10-CM

## 2023-10-17 DIAGNOSIS — D1611 Benign neoplasm of short bones of right upper limb: Secondary | ICD-10-CM

## 2023-10-17 DIAGNOSIS — M79671 Pain in right foot: Secondary | ICD-10-CM

## 2023-10-17 DIAGNOSIS — R7303 Prediabetes: Secondary | ICD-10-CM

## 2023-10-17 DIAGNOSIS — I10 Essential (primary) hypertension: Secondary | ICD-10-CM

## 2023-10-17 NOTE — Patient Instructions (Signed)

## 2023-11-14 ENCOUNTER — Ambulatory Visit: Payer: No Typology Code available for payment source | Admitting: Rheumatology

## 2024-01-14 ENCOUNTER — Encounter: Payer: Self-pay | Admitting: Internal Medicine

## 2024-01-14 ENCOUNTER — Ambulatory Visit: Payer: Self-pay | Admitting: Internal Medicine

## 2024-01-14 VITALS — BP 140/80 | HR 85 | Ht 63.0 in | Wt 235.6 lb

## 2024-01-14 DIAGNOSIS — I10 Essential (primary) hypertension: Secondary | ICD-10-CM | POA: Diagnosis not present

## 2024-01-14 DIAGNOSIS — R7303 Prediabetes: Secondary | ICD-10-CM | POA: Diagnosis not present

## 2024-01-14 DIAGNOSIS — M1990 Unspecified osteoarthritis, unspecified site: Secondary | ICD-10-CM

## 2024-01-14 DIAGNOSIS — F419 Anxiety disorder, unspecified: Secondary | ICD-10-CM

## 2024-01-14 DIAGNOSIS — K219 Gastro-esophageal reflux disease without esophagitis: Secondary | ICD-10-CM

## 2024-01-14 DIAGNOSIS — E782 Mixed hyperlipidemia: Secondary | ICD-10-CM

## 2024-01-14 DIAGNOSIS — Z853 Personal history of malignant neoplasm of breast: Secondary | ICD-10-CM

## 2024-01-14 DIAGNOSIS — E66813 Obesity, class 3: Secondary | ICD-10-CM

## 2024-01-14 DIAGNOSIS — M797 Fibromyalgia: Secondary | ICD-10-CM

## 2024-01-14 DIAGNOSIS — F32A Depression, unspecified: Secondary | ICD-10-CM

## 2024-01-14 DIAGNOSIS — J301 Allergic rhinitis due to pollen: Secondary | ICD-10-CM

## 2024-01-14 MED ORDER — PROMETHAZINE-DM 6.25-15 MG/5ML PO SYRP: 5.0000 mL | ORAL_SOLUTION | Freq: Four times a day (QID) | ORAL | 0 refills | Status: DC | PRN

## 2024-01-14 MED ORDER — CELECOXIB 100 MG PO CAPS: 100.0000 mg | ORAL_CAPSULE | Freq: Two times a day (BID) | ORAL | 1 refills | Status: DC

## 2024-01-14 MED ORDER — OLMESARTAN MEDOXOMIL 20 MG PO TABS: 10.0000 mg | ORAL_TABLET | Freq: Every day | ORAL | 1 refills | Status: DC

## 2024-01-14 NOTE — Assessment & Plan Note (Signed)
 Daily use of ibuprofen  likely contributing to high blood pressure, will discontinue Rx for Celebrex 100 mg twice daily Encourage weight loss as this can help reduce joint pain

## 2024-01-14 NOTE — Assessment & Plan Note (Signed)
 Encouraged diet and exercise for weight loss ?

## 2024-01-14 NOTE — Progress Notes (Signed)
 Subjective:    Patient ID: Stacy [name], female    DOB: Jul 18, 1961, 63 y.o.   MRN: [id number]  HPI  Patient presents to clinic today for follow-up of chronic conditions.  Anxiety and depression: Situational.  She is not currently taking any medications for this.  She is not seeing a therapist.  She denies SI/HI.  OA/fibromyalgia: Managed with ibuprofen  as needed with some relief of symptoms, but she is taking this daily.  She does not follow with orthopedics or pain management.  HTN: Her BP today is 148/88.  She is taking HCTZ as prescribed.  ECG from 01/2017 reviewed.  History of left breast cancer: Status post excision, chemo and radiation.  She no longer follows with oncology.  GERD: Triggered by spicy and greasy foods.  She does not take any medications OTC for this.  There is no upper GI on file.  HLD: Her last LDL was 97, triglycerides 782, 06/2023.  She is not taking any cholesterol-lowering medication at this time.  She tries to consume low-fat diet  Prediabetes: Her last A1c was 6%, 06/2023.  She is not taking any oral diabetic medication at this time.  She does not check her sugars.  She also reports nasal congestion, ear fullness, scratchy throat and cough. This started 1 week ago. She is blowing green mucous out of her nose. She is coughing up green mucous. She denies headache, sinus pressure, runny nose, ear pain, sore throat, shortness of breath, nausea, vomiting and diarrhea. She denies fever, chills or body aches. She has tried Catering manager cold.  Review of Systems     Past Medical History:  Diagnosis Date   Anxiety    Arthritis    Breast cancer (HCC)    Cancer (HCC)    Cataract    left eye, per patient   Complication of anesthesia    DIFFICULTY WAKING UP , LAST SURGERY NECK 2012 WAS FINE PER PT HERE AT [facility]   Depression    Fibromyalgia    Genetic testing 12/21/2016   Genetic testing reported out on 12/20/2016 through [facility]'s 46-gene Common Hereditary  Cancers panel found no deleterious mutations. The following 46 genes were analyzed: APC, ATM, AXIN2, BARD1, BMPR1A, BRCA1, BRCA2, BRIP1, CDH1, CDKN2A, CHEK2, CTNNA1, DICER1, EPCAM, GREM1, KIT, MEN1, MLH1, MSH2, MSH3, MSH6, MUTYH, NBN, NF1, NTHL1, PALB2, PDGFRA, PMS2, POLD1, POLE, PTEN, RAD50, RAD51C, RAD51D, SDH   Headache    HX MIGRAINES     Heart murmur    History of radiation therapy 02/14/17-04/03/2017   left breast 60.4 Gy: 50.4 Gy delivered in 28 fractions and a boost of 10 Gy delivered in 5 fractions.   Hypertension    Macular degeneration    right eye, per patient   Malignant neoplasm of upper-outer quadrant of left female breast (HCC) 08/23/2016   Mitral valve disorder    MVP (mitral valve prolapse)    AS INFANT   Neck pain    Personal history of chemotherapy    Personal history of radiation therapy    Vision abnormalities     Current Outpatient Medications  Medication Sig Dispense Refill   hydrochlorothiazide  (HYDRODIURIL ) 25 MG tablet TAKE 1 TABLET (25 MG TOTAL) BY MOUTH DAILY. 90 tablet 1   ibuprofen  (ADVIL ,MOTRIN ) 200 MG tablet Take 400 mg by mouth every 6 (six) hours as needed for mild pain.     No current facility-administered medications for this visit.    Allergies  Allergen Reactions   Gabapentin Swelling    "  makes me feel detached"   Naproxen Palpitations   Neulasta  [Pegfilgrastim ] Itching and Other (See Comments)    Patient stated,"reddish skin tone, body on fire, tingly tongue."   Erythromycin Nausea And Vomiting   Gabapentin Other (See Comments)    "Makes me feel out of my body"   Neulasta  [Pegfilgrastim ] Itching   Tramadol Other (See Comments)    headache   Erythromycin Nausea And Vomiting   Naproxen Palpitations   Tramadol Hcl Other (See Comments)    Headaches    Family History  Adopted: Yes  Problem Relation Age of Onset   Ehlers-Danlos syndrome Sister    Breast cancer Brother 26       unknown   Healthy Son    Healthy Son    Healthy Son     Hypertension Son    Healthy Son    BRCA 1/2 Neg Hx     Social History   Socioeconomic History   Marital status: Married    Spouse name: Not on file   Number of children: 4   Years of education: Not on file   Highest education level: Some college, no degree  Occupational History   Not on file  Tobacco Use   Smoking status: Every Day    Current packs/day: 0.50    Types: Cigarettes    Passive exposure: Past   Smokeless tobacco: Never  Vaping Use   Vaping status: Never Used  Substance and Sexual Activity   Alcohol use: Never   Drug use: Not Currently   Sexual activity: Yes    Birth control/protection: Post-menopausal  Other Topics Concern   Not on file  Social History Narrative   ** Merged History Encounter **       Social Drivers of Health   Financial Resource Strain: Medium Risk (01/09/2024)   Overall Financial Resource Strain (CARDIA)    Difficulty of Paying Living Expenses: Somewhat hard  Food Insecurity: Food Insecurity Present (01/09/2024)   Hunger Vital Sign    Worried About Running Out of Food in the Last Year: Sometimes true    Ran Out of Food in the Last Year: Sometimes true  Transportation Needs: No Transportation Needs (01/09/2024)   PRAPARE - Administrator, Civil Service (Medical): No    Lack of Transportation (Non-Medical): No  Physical Activity: Insufficiently Active (01/09/2024)   Exercise Vital Sign    Days of Exercise per Week: 1 day    Minutes of Exercise per Session: 20 min  Stress: Stress Concern Present (01/09/2024)   [employer] of Occupational Health - Occupational Stress Questionnaire    Feeling of Stress : Very much  Social Connections: Moderately Isolated (01/09/2024)   Social Connection and Isolation Panel [NHANES]    Frequency of Communication with Friends and Family: More than three times a week    Frequency of Social Gatherings with Friends and Family: Once a week    Attends Religious Services: 1 to 4 times per year     Active Member of [employer] or Organizations: No    Attends Engineer, structural: Not on file    Marital Status: Separated  Intimate Partner Violence: Not on file     Constitutional: Denies fever, malaise, fatigue, headache or abrupt weight changes.  HEENT: Pt reports nasal congestion. Denies eye pain, eye redness, ear pain, ringing in the ears, wax buildup, runny nose, bloody nose, or sore throat. Respiratory: Pt reports cough. Denies difficulty breathing, shortness of breath, or sputum production.   Cardiovascular:  Denies chest pain, chest tightness, palpitations or swelling in the hands or feet.  Gastrointestinal: Patient reports intermittent reflux.  Denies abdominal pain, bloating, constipation, diarrhea or blood in the stool.  GU: Denies urgency, frequency, pain with urination, burning sensation, blood in urine, odor or discharge. Musculoskeletal: Patient reports chronic muscle and joint pain.  Denies decrease in range of motion, difficulty with gait, or joint swelling.  Skin: Denies redness, rashes, lesions or ulcercations.  Neurological: Denies dizziness, difficulty with memory, difficulty with speech or problems with balance and coordination.  Psych: Patient has a history of anxiety and depression.  Denies SI/HI.  No other specific complaints in a complete review of systems (except as listed in HPI above).  Objective:   Physical Exam  BP (!) 140/80   Pulse 85   Ht 5\' 3"  (1.6 m)   Wt 235 lb 9.6 oz (106.9 kg)   LMP 08/29/2016   SpO2 97%   BMI 41.73 kg/m    Wt Readings from Last 3 Encounters:  10/17/23 232 lb 3.2 oz (105.3 kg)  07/18/23 228 lb (103.4 kg)  04/04/23 224 lb (101.6 kg)    General: Appears her stated age, obese in NAD. Skin: Warm, dry and intact.  HEENT: Head: normal shape and size, no sinus tenderness noted.; Eyes: sclera white, no icterus, conjunctiva pink, PERRLA and EOMs intact; Nose: mucosa pink and dry, septum midline; Throat: Mucosa pink and  moist, + PND. Neck: No adenopathy noted. Cardiovascular: Normal rate and rhythm. S1,S2 noted. Murmur noted. No JVD or BLE edema. No carotid bruits noted. Pulmonary/Chest: Normal effort and positive vesicular breath sounds. No respiratory distress. No wheezes, rales or ronchi noted.  Abdomen: Normal bowel sounds.  Musculoskeletal:  No difficulty with gait.  Neurological: Alert and oriented. Coordination normal.  Psychiatric: Mood and affect normal. Behavior is normal. Judgment and thought content normal.    BMET    Component Value Date/Time   NA 136 07/18/2023 0952   NA 138 09/11/2017 0902   K 4.3 07/18/2023 0952   K 3.5 09/11/2017 0902   CL 100 07/18/2023 0952   CO2 27 07/18/2023 0952   CO2 25 09/11/2017 0902   GLUCOSE 105 (H) 07/18/2023 0952   GLUCOSE 98 09/11/2017 0902   BUN 16 07/18/2023 0952   BUN 18.2 09/11/2017 0902   CREATININE 0.92 07/18/2023 0952   CREATININE 1.0 09/11/2017 0902   CALCIUM 9.5 07/18/2023 0952   CALCIUM 9.4 09/11/2017 0902   GFRNONAA >60 11/01/2018 1657   GFRAA >60 11/01/2018 1657    Lipid Panel     Component Value Date/Time   CHOL 177 07/18/2023 0952   TRIG 119 07/18/2023 0952   HDL 58 07/18/2023 0952   CHOLHDL 3.1 07/18/2023 0952   VLDL 23.4 11/07/2018 1432   LDLCALC 97 07/18/2023 0952    CBC    Component Value Date/Time   WBC 10.4 07/18/2023 0952   RBC 4.89 07/18/2023 0952   HGB 14.8 07/18/2023 0952   HGB 13.0 09/11/2017 0902   HCT 44.3 07/18/2023 0952   HCT 38.6 09/11/2017 0902   PLT 344 07/18/2023 0952   PLT 263 09/11/2017 0902   MCV 90.6 07/18/2023 0952   MCV 88.7 09/11/2017 0902   MCH 30.3 07/18/2023 0952   MCHC 33.4 07/18/2023 0952   RDW 12.5 07/18/2023 0952   RDW 13.4 09/11/2017 0902   LYMPHSABS 2.8 11/01/2018 1657   LYMPHSABS 1.4 09/11/2017 0902   MONOABS 1.3 (H) 11/01/2018 1657   MONOABS 0.8 09/11/2017 0902  EOSABS 0.1 11/01/2018 1657   EOSABS 0.2 09/11/2017 0902   BASOSABS 0.1 11/01/2018 1657   BASOSABS 0.0  09/11/2017 0902    Hgb A1C Lab Results  Component Value Date   HGBA1C 6.0 (H) 07/18/2023            Assessment & Plan:   Allergic rhinitis:  Encourage antihistamine such as Claritin, Allegra or Zyrtec Recommend Flonase  1 spray each nostril 2 times daily for 1 week then daily thereafter Promethazine  DM for cough   RTC in 2 weeks, follow-up HTN 6 months for your annual exam Helayne Lo, NP

## 2024-01-14 NOTE — Assessment & Plan Note (Signed)
 Daily use of ibuprofen  likely contributing to high blood pressure, will discontinue Rx for Celebrex 100 mg twice daily Encourage regular stretching and muscle toning

## 2024-01-14 NOTE — Assessment & Plan Note (Signed)
 A1c today Encourage low-carb diet and exercise for weight loss

## 2024-01-14 NOTE — Assessment & Plan Note (Signed)
 Uncontrolled on HCTZ, will continue Will add olmesartan 10 mg daily Reinforced DASH diet and exercise for weight loss C-Met today

## 2024-01-14 NOTE — Assessment & Plan Note (Signed)
 Currently not an issue We will monitor

## 2024-01-14 NOTE — Assessment & Plan Note (Signed)
 C-Met and lipid profile today Encouraged her to consume a low-fat diet

## 2024-01-14 NOTE — Assessment & Plan Note (Signed)
Stable off meds We will monitor

## 2024-01-14 NOTE — Assessment & Plan Note (Signed)
In remission Getting yearly mammograms

## 2024-01-14 NOTE — Patient Instructions (Signed)

## 2024-01-15 ENCOUNTER — Encounter: Payer: Self-pay | Admitting: Internal Medicine

## 2024-01-15 LAB — CBC
HCT: 41.6 % (ref 35.0–45.0)
Hemoglobin: 13.9 g/dL (ref 11.7–15.5)
MCH: 30 pg (ref 27.0–33.0)
MCHC: 33.4 g/dL (ref 32.0–36.0)
MCV: 89.8 fL (ref 80.0–100.0)
MPV: 11.5 fL (ref 7.5–12.5)
Platelets: 302 10*3/uL (ref 140–400)
RBC: 4.63 10*6/uL (ref 3.80–5.10)
RDW: 12.7 % (ref 11.0–15.0)
WBC: 10 10*3/uL (ref 3.8–10.8)

## 2024-01-15 LAB — LIPID PANEL
Cholesterol: 175 mg/dL (ref <200)
HDL: 48 mg/dL — ABNORMAL LOW (ref >=50)
LDL Cholesterol (Calc): 104 mg/dL — ABNORMAL HIGH
Non-HDL Cholesterol (Calc): 127 mg/dL (ref <130)
Total CHOL/HDL Ratio: 3.6 (calc) (ref <5.0)
Triglycerides: 135 mg/dL (ref <150)

## 2024-01-15 LAB — COMPREHENSIVE METABOLIC PANEL WITH GFR
AG Ratio: 1.5 (calc) (ref 1.0–2.5)
ALT: 9 U/L (ref 6–29)
AST: 14 U/L (ref 10–35)
Albumin: 4 g/dL (ref 3.6–5.1)
Alkaline phosphatase (APISO): 69 U/L (ref 37–153)
BUN: 16 mg/dL (ref 7–25)
CO2: 28 mmol/L (ref 20–32)
Calcium: 9 mg/dL (ref 8.6–10.4)
Chloride: 102 mmol/L (ref 98–110)
Creat: 0.99 mg/dL (ref 0.50–1.05)
Globulin: 2.6 g/dL (ref 1.9–3.7)
Glucose, Bld: 111 mg/dL (ref 65–139)
Potassium: 4 mmol/L (ref 3.5–5.3)
Sodium: 139 mmol/L (ref 135–146)
Total Bilirubin: 0.4 mg/dL (ref 0.2–1.2)
Total Protein: 6.6 g/dL (ref 6.1–8.1)
eGFR: 65 mL/min/{1.73_m2} (ref >=60)

## 2024-01-15 LAB — HEMOGLOBIN A1C
Hgb A1c MFr Bld: 6 % — ABNORMAL HIGH (ref <5.7)
Mean Plasma Glucose: 126 mg/dL
eAG (mmol/L): 7 mmol/L

## 2024-01-30 ENCOUNTER — Other Ambulatory Visit: Payer: Self-pay | Admitting: Medical Genetics

## 2024-02-06 ENCOUNTER — Other Ambulatory Visit

## 2024-02-06 ENCOUNTER — Ambulatory Visit: Admitting: Internal Medicine

## 2024-03-07 ENCOUNTER — Other Ambulatory Visit: Payer: Self-pay | Admitting: Internal Medicine

## 2024-03-07 DIAGNOSIS — I10 Essential (primary) hypertension: Secondary | ICD-10-CM

## 2024-03-10 NOTE — Telephone Encounter (Signed)
 Requested Prescriptions  Pending Prescriptions Disp Refills   hydrochlorothiazide  (HYDRODIURIL ) 25 MG tablet [Pharmacy Med Name: HYDROCHLOROTHIAZIDE  25 MG TAB] 90 tablet 1    Sig: TAKE 1 TABLET (25 MG TOTAL) BY MOUTH DAILY.     Cardiovascular: Diuretics - Thiazide Failed - 03/10/2024 12:14 PM      Failed - Last BP in normal range    BP Readings from Last 1 Encounters:  01/14/24 (!) 140/80         Passed - Cr in normal range and within 180 days    Creatinine  Date Value Ref Range Status  09/11/2017 1.0 0.6 - 1.1 mg/dL Final   Creat  Date Value Ref Range Status  01/14/2024 0.99 0.50 - 1.05 mg/dL Final         Passed - K in normal range and within 180 days    Potassium  Date Value Ref Range Status  01/14/2024 4.0 3.5 - 5.3 mmol/L Final  09/11/2017 3.5 3.5 - 5.1 mEq/L Final         Passed - Na in normal range and within 180 days    Sodium  Date Value Ref Range Status  01/14/2024 139 135 - 146 mmol/L Final  09/11/2017 138 136 - 145 mEq/L Final         Passed - Valid encounter within last 6 months    Recent Outpatient Visits           1 month ago Mixed hyperlipidemia   [place] [facility] [place], Angeline ORN, NP

## 2024-07-07 ENCOUNTER — Other Ambulatory Visit: Payer: Self-pay | Admitting: Internal Medicine

## 2024-07-07 DIAGNOSIS — E66813 Obesity, class 3: Secondary | ICD-10-CM

## 2024-07-07 DIAGNOSIS — R7303 Prediabetes: Secondary | ICD-10-CM

## 2024-07-07 DIAGNOSIS — Z853 Personal history of malignant neoplasm of breast: Secondary | ICD-10-CM

## 2024-07-07 DIAGNOSIS — Z0001 Encounter for general adult medical examination with abnormal findings: Secondary | ICD-10-CM

## 2024-07-07 DIAGNOSIS — E7800 Pure hypercholesterolemia, unspecified: Secondary | ICD-10-CM

## 2024-07-07 DIAGNOSIS — Z1231 Encounter for screening mammogram for malignant neoplasm of breast: Secondary | ICD-10-CM

## 2024-07-07 DIAGNOSIS — M79641 Pain in right hand: Secondary | ICD-10-CM

## 2024-07-07 DIAGNOSIS — M7989 Other specified soft tissue disorders: Secondary | ICD-10-CM

## 2024-07-07 DIAGNOSIS — R7982 Elevated C-reactive protein (CRP): Secondary | ICD-10-CM

## 2024-07-10 ENCOUNTER — Other Ambulatory Visit: Payer: Self-pay | Admitting: Medical Genetics

## 2024-07-10 DIAGNOSIS — Z006 Encounter for examination for normal comparison and control in clinical research program: Secondary | ICD-10-CM

## 2024-08-10 LAB — GENECONNECT MOLECULAR SCREEN: Genetic Analysis Overall Interpretation: NEGATIVE

## 2024-08-24 ENCOUNTER — Ambulatory Visit
Admission: RE | Admit: 2024-08-24 | Discharge: 2024-08-24 | Disposition: A | Source: Ambulatory Visit | Attending: Internal Medicine | Admitting: Internal Medicine

## 2024-08-24 ENCOUNTER — Encounter: Payer: Self-pay | Admitting: Hematology and Oncology

## 2024-08-24 DIAGNOSIS — Z853 Personal history of malignant neoplasm of breast: Secondary | ICD-10-CM

## 2024-08-30 ENCOUNTER — Other Ambulatory Visit: Payer: Self-pay | Admitting: Internal Medicine

## 2024-08-30 DIAGNOSIS — I10 Essential (primary) hypertension: Secondary | ICD-10-CM

## 2024-09-01 NOTE — Telephone Encounter (Signed)
 Requested Prescriptions  Pending Prescriptions Disp Refills   hydrochlorothiazide  (HYDRODIURIL ) 25 MG tablet [Pharmacy Med Name: HYDROCHLOROTHIAZIDE  25 MG TAB] 90 tablet 0    Sig: TAKE 1 TABLET (25 MG TOTAL) BY MOUTH DAILY.     Cardiovascular: Diuretics - Thiazide Failed - 09/01/2024  4:23 PM      Failed - Cr in normal range and within 180 days    Creatinine  Date Value Ref Range Status  09/11/2017 1.0 0.6 - 1.1 mg/dL Final   Creat  Date Value Ref Range Status  01/14/2024 0.99 0.50 - 1.05 mg/dL Final         Failed - K in normal range and within 180 days    Potassium  Date Value Ref Range Status  01/14/2024 4.0 3.5 - 5.3 mmol/L Final  09/11/2017 3.5 3.5 - 5.1 mEq/L Final         Failed - Na in normal range and within 180 days    Sodium  Date Value Ref Range Status  01/14/2024 139 135 - 146 mmol/L Final  09/11/2017 138 136 - 145 mEq/L Final         Failed - Last BP in normal range    BP Readings from Last 1 Encounters:  01/14/24 (!) 140/80         Failed - Valid encounter within last 6 months    Recent Outpatient Visits           7 months ago Mixed hyperlipidemia   [place] [facility] [place], Angeline ORN, NP

## 2024-09-07 ENCOUNTER — Ambulatory Visit
Admission: RE | Admit: 2024-09-07 | Discharge: 2024-09-07 | Disposition: A | Discharge status: Home / Self Care | Payer: Self-pay

## 2024-09-07 ENCOUNTER — Emergency Department

## 2024-09-07 ENCOUNTER — Other Ambulatory Visit: Payer: Self-pay

## 2024-09-07 ENCOUNTER — Inpatient Hospital Stay
Admission: EM | Admit: 2024-09-07 | Discharge: 2024-09-09 | DRG: 690 | Disposition: A | Discharge status: Home / Self Care

## 2024-09-07 VITALS — BP 100/68 | HR 97 | Temp 97.9°F | Resp 20

## 2024-09-07 DIAGNOSIS — E876 Hypokalemia: Secondary | ICD-10-CM | POA: Diagnosis present

## 2024-09-07 DIAGNOSIS — Z885 Allergy status to narcotic agent status: Secondary | ICD-10-CM

## 2024-09-07 DIAGNOSIS — R197 Diarrhea, unspecified: Secondary | ICD-10-CM

## 2024-09-07 DIAGNOSIS — Z7985 Long-term (current) use of injectable non-insulin antidiabetic drugs: Secondary | ICD-10-CM

## 2024-09-07 DIAGNOSIS — Z8249 Family history of ischemic heart disease and other diseases of the circulatory system: Secondary | ICD-10-CM | POA: Diagnosis not present

## 2024-09-07 DIAGNOSIS — R739 Hyperglycemia, unspecified: Secondary | ICD-10-CM | POA: Diagnosis present

## 2024-09-07 DIAGNOSIS — M797 Fibromyalgia: Secondary | ICD-10-CM | POA: Diagnosis present

## 2024-09-07 DIAGNOSIS — E66812 Obesity, class 2: Secondary | ICD-10-CM | POA: Diagnosis present

## 2024-09-07 DIAGNOSIS — R112 Nausea with vomiting, unspecified: Secondary | ICD-10-CM | POA: Diagnosis not present

## 2024-09-07 DIAGNOSIS — R590 Localized enlarged lymph nodes: Secondary | ICD-10-CM | POA: Diagnosis present

## 2024-09-07 DIAGNOSIS — Z888 Allergy status to other drugs, medicaments and biological substances status: Secondary | ICD-10-CM

## 2024-09-07 DIAGNOSIS — Z96641 Presence of right artificial hip joint: Secondary | ICD-10-CM | POA: Diagnosis present

## 2024-09-07 DIAGNOSIS — R1110 Vomiting, unspecified: Secondary | ICD-10-CM | POA: Diagnosis not present

## 2024-09-07 DIAGNOSIS — Z881 Allergy status to other antibiotic agents status: Secondary | ICD-10-CM

## 2024-09-07 DIAGNOSIS — E871 Hypo-osmolality and hyponatremia: Secondary | ICD-10-CM | POA: Diagnosis present

## 2024-09-07 DIAGNOSIS — R1010 Upper abdominal pain, unspecified: Secondary | ICD-10-CM

## 2024-09-07 DIAGNOSIS — I10 Essential (primary) hypertension: Secondary | ICD-10-CM | POA: Diagnosis present

## 2024-09-07 DIAGNOSIS — I959 Hypotension, unspecified: Secondary | ICD-10-CM | POA: Diagnosis not present

## 2024-09-07 DIAGNOSIS — N12 Tubulo-interstitial nephritis, not specified as acute or chronic: Principal | ICD-10-CM | POA: Diagnosis present

## 2024-09-07 DIAGNOSIS — K219 Gastro-esophageal reflux disease without esophagitis: Secondary | ICD-10-CM | POA: Diagnosis present

## 2024-09-07 DIAGNOSIS — N179 Acute kidney failure, unspecified: Secondary | ICD-10-CM | POA: Diagnosis present

## 2024-09-07 DIAGNOSIS — Z923 Personal history of irradiation: Secondary | ICD-10-CM

## 2024-09-07 DIAGNOSIS — Z853 Personal history of malignant neoplasm of breast: Secondary | ICD-10-CM

## 2024-09-07 DIAGNOSIS — Z9221 Personal history of antineoplastic chemotherapy: Secondary | ICD-10-CM | POA: Diagnosis not present

## 2024-09-07 DIAGNOSIS — E878 Other disorders of electrolyte and fluid balance, not elsewhere classified: Secondary | ICD-10-CM | POA: Diagnosis present

## 2024-09-07 DIAGNOSIS — K5730 Diverticulosis of large intestine without perforation or abscess without bleeding: Secondary | ICD-10-CM | POA: Diagnosis present

## 2024-09-07 DIAGNOSIS — Z6838 Body mass index (BMI) 38.0-38.9, adult: Secondary | ICD-10-CM | POA: Diagnosis not present

## 2024-09-07 DIAGNOSIS — N10 Acute tubulo-interstitial nephritis: Principal | ICD-10-CM | POA: Diagnosis present

## 2024-09-07 DIAGNOSIS — D259 Leiomyoma of uterus, unspecified: Secondary | ICD-10-CM | POA: Diagnosis present

## 2024-09-07 DIAGNOSIS — Z59868 Other specified financial insecurity: Secondary | ICD-10-CM

## 2024-09-07 DIAGNOSIS — H3530 Unspecified macular degeneration: Secondary | ICD-10-CM | POA: Diagnosis present

## 2024-09-07 DIAGNOSIS — R809 Proteinuria, unspecified: Secondary | ICD-10-CM | POA: Diagnosis present

## 2024-09-07 DIAGNOSIS — E860 Dehydration: Secondary | ICD-10-CM | POA: Diagnosis present

## 2024-09-07 DIAGNOSIS — Z803 Family history of malignant neoplasm of breast: Secondary | ICD-10-CM

## 2024-09-07 DIAGNOSIS — Z79899 Other long term (current) drug therapy: Secondary | ICD-10-CM

## 2024-09-07 DIAGNOSIS — Z9851 Tubal ligation status: Secondary | ICD-10-CM

## 2024-09-07 DIAGNOSIS — F17210 Nicotine dependence, cigarettes, uncomplicated: Secondary | ICD-10-CM | POA: Diagnosis present

## 2024-09-07 DIAGNOSIS — D649 Anemia, unspecified: Secondary | ICD-10-CM | POA: Diagnosis not present

## 2024-09-07 LAB — URINALYSIS, ROUTINE W REFLEX MICROSCOPIC
Bilirubin Urine: NEGATIVE
Glucose, UA: NEGATIVE mg/dL
Ketones, ur: NEGATIVE mg/dL
Nitrite: NEGATIVE
Protein, ur: 100 mg/dL — AB
Specific Gravity, Urine: 1.006 (ref 1.005–1.030)
WBC, UA: >50 WBC/hpf (ref 0–5)
pH: 5 (ref 5.0–8.0)

## 2024-09-07 LAB — COMPREHENSIVE METABOLIC PANEL WITH GFR
ALT: 14 U/L (ref 0–44)
AST: 23 U/L (ref 15–41)
Albumin: 3.8 g/dL (ref 3.5–5.0)
Alkaline Phosphatase: 83 U/L (ref 38–126)
Anion gap: 19 — ABNORMAL HIGH (ref 5–15)
BUN: 29 mg/dL — ABNORMAL HIGH (ref 6–20)
CO2: 21 mmol/L — ABNORMAL LOW (ref 22–32)
Calcium: 9.1 mg/dL (ref 8.9–10.3)
Chloride: 90 mmol/L — ABNORMAL LOW (ref 98–111)
Creatinine, Ser: 1.62 mg/dL — ABNORMAL HIGH (ref 0.44–1.00)
GFR, Estimated: 36 mL/min — ABNORMAL LOW
Glucose, Bld: 140 mg/dL — ABNORMAL HIGH (ref 70–99)
Potassium: 3.7 mmol/L (ref 3.5–5.1)
Sodium: 129 mmol/L — ABNORMAL LOW (ref 135–145)
Total Bilirubin: 1.4 mg/dL — ABNORMAL HIGH (ref 0.0–1.2)
Total Protein: 7.8 g/dL (ref 6.5–8.1)

## 2024-09-07 LAB — CBC
HCT: 42.6 % (ref 36.0–46.0)
Hemoglobin: 14.7 g/dL (ref 12.0–15.0)
MCH: 29.6 pg (ref 26.0–34.0)
MCHC: 34.5 g/dL (ref 30.0–36.0)
MCV: 85.7 fL (ref 80.0–100.0)
Platelets: 229 K/uL (ref 150–400)
RBC: 4.97 MIL/uL (ref 3.87–5.11)
RDW: 12.9 % (ref 11.5–15.5)
WBC: 22.6 K/uL — ABNORMAL HIGH (ref 4.0–10.5)
nRBC: 0 % (ref 0.0–0.2)

## 2024-09-07 LAB — POC COVID19/FLU A&B COMBO
Covid Antigen, POC: NEGATIVE
Influenza A Antigen, POC: NEGATIVE
Influenza B Antigen, POC: NEGATIVE

## 2024-09-07 LAB — LIPASE, BLOOD: Lipase: 17 U/L (ref 11–51)

## 2024-09-07 LAB — LACTIC ACID, PLASMA: Lactic Acid, Venous: 1.3 mmol/L (ref 0.5–1.9)

## 2024-09-07 MED ORDER — PANTOPRAZOLE SODIUM 40 MG PO TBEC
40.0000 mg | DELAYED_RELEASE_TABLET | Freq: Every day | ORAL | Status: DC
  Administered 2024-09-07 – 2024-09-09 (×3): 40 mg via ORAL
  Filled 2024-09-07 (×3): qty 1

## 2024-09-07 MED ORDER — OXYCODONE HCL 5 MG PO TABS: 2.5000 mg | ORAL_TABLET | ORAL | Status: DC | PRN

## 2024-09-07 MED ORDER — ACETAMINOPHEN 325 MG PO TABS
650.0000 mg | ORAL_TABLET | Freq: Four times a day (QID) | ORAL | Status: DC | PRN
  Administered 2024-09-07: 650 mg via ORAL
  Filled 2024-09-07: qty 2

## 2024-09-07 MED ORDER — MORPHINE SULFATE (PF) 4 MG/ML IV SOLN
4.0000 mg | Freq: Once | INTRAVENOUS | Status: AC
  Administered 2024-09-07: 4 mg via INTRAVENOUS
  Filled 2024-09-07: qty 1

## 2024-09-07 MED ORDER — LACTATED RINGERS IV SOLN: INTRAVENOUS | Status: DC

## 2024-09-07 MED ORDER — ONDANSETRON HCL 4 MG/2ML IJ SOLN
4.0000 mg | Freq: Once | INTRAMUSCULAR | Status: AC
  Administered 2024-09-07: 4 mg via INTRAVENOUS
  Filled 2024-09-07: qty 2

## 2024-09-07 MED ORDER — SODIUM CHLORIDE 0.9 % IV SOLN: Freq: Once | INTRAVENOUS | Status: AC

## 2024-09-07 MED ORDER — IOHEXOL 300 MG/ML  SOLN
100.0000 mL | Freq: Once | INTRAMUSCULAR | Status: AC | PRN
  Administered 2024-09-07: 80 mL via INTRAVENOUS

## 2024-09-07 MED ORDER — MIDODRINE HCL 5 MG PO TABS
5.0000 mg | ORAL_TABLET | ORAL | Status: AC
  Administered 2024-09-07: 5 mg via ORAL
  Filled 2024-09-07: qty 1

## 2024-09-07 MED ORDER — SODIUM CHLORIDE 0.9 % IV SOLN
1.0000 g | INTRAVENOUS | Status: DC
  Filled 2024-09-07: qty 10

## 2024-09-07 MED ORDER — ONDANSETRON HCL 4 MG/2ML IJ SOLN
4.0000 mg | Freq: Four times a day (QID) | INTRAMUSCULAR | Status: DC | PRN
  Administered 2024-09-07 – 2024-09-08 (×2): 4 mg via INTRAVENOUS
  Filled 2024-09-07 (×2): qty 2

## 2024-09-07 MED ORDER — SODIUM CHLORIDE 0.9 % IV BOLUS
1000.0000 mL | Freq: Once | INTRAVENOUS | Status: AC
  Administered 2024-09-07: 1000 mL via INTRAVENOUS

## 2024-09-07 MED ORDER — MORPHINE SULFATE (PF) 2 MG/ML IV SOLN
2.0000 mg | INTRAVENOUS | Status: AC
  Administered 2024-09-07: 2 mg via INTRAVENOUS
  Filled 2024-09-07: qty 1

## 2024-09-07 MED ORDER — ENOXAPARIN SODIUM 40 MG/0.4ML IJ SOSY
40.0000 mg | PREFILLED_SYRINGE | Freq: Every day | INTRAMUSCULAR | Status: DC
  Administered 2024-09-07 – 2024-09-08 (×2): 40 mg via SUBCUTANEOUS
  Filled 2024-09-07 (×2): qty 0.4

## 2024-09-07 MED ORDER — ONDANSETRON 4 MG PO TBDP
4.0000 mg | ORAL_TABLET | Freq: Once | ORAL | Status: AC
  Administered 2024-09-07: 4 mg via ORAL

## 2024-09-07 MED ORDER — OXYCODONE HCL 5 MG PO TABS
5.0000 mg | ORAL_TABLET | ORAL | Status: DC | PRN
  Administered 2024-09-07 – 2024-09-08 (×4): 5 mg via ORAL
  Filled 2024-09-07 (×4): qty 1

## 2024-09-07 MED ORDER — SODIUM CHLORIDE 0.9 % IV SOLN
1.0000 g | Freq: Once | INTRAVENOUS | Status: AC
  Administered 2024-09-07: 1 g via INTRAVENOUS
  Filled 2024-09-07: qty 10

## 2024-09-07 MED ORDER — PROCHLORPERAZINE EDISYLATE 10 MG/2ML IJ SOLN
5.0000 mg | Freq: Once | INTRAMUSCULAR | Status: AC
  Administered 2024-09-07: 5 mg via INTRAVENOUS
  Filled 2024-09-07: qty 2

## 2024-09-07 NOTE — ED Triage Notes (Signed)
 Pt via POV from home. Pt was seen at [facility]. Pt c/o epigastric pain, NVD, and loss of appetite that started Friday. Denies any abd surgeries. Pt is A&Ox4 and NAD

## 2024-09-07 NOTE — ED Notes (Signed)
 Patient is being discharged from the Urgent Care and sent to the Emergency Department via POV . Per Burnard Cork NP, patient is in need of higher level of care due to abdominal pain/ nausea/ vomiting. Patient is aware and verbalizes understanding of plan of care.  Vitals:   09/07/24 1036  BP: 100/68  Pulse: 97  Resp: 20  Temp: 97.9 F (36.6 C)  SpO2: 97%

## 2024-09-07 NOTE — ED Triage Notes (Addendum)
 Patient to Urgent Care with complaints of nausea/ emesis/ fevers/ body aches/ poor appetite.  Symptoms started Friday. Unable to tolerate PO.

## 2024-09-07 NOTE — H&P (Addendum)
 " History and Physical    Patient: Stacy [name] FMW:[id number] DOB: [birth year] DOA: 09/07/2024 DOS: the patient was seen and examined on 09/07/2024 PCP: Antonette Angeline ORN, NP  Patient coming from: Home  Chief Complaint:  Chief Complaint  Patient presents with   Emesis   Abdominal Pain   HPI: Stacy [name] is a 63 y.o. female with medical history significant of PMH GERD, HTN, Fibromyalgia presented with nausea/vomiting, abdominal pain, ongoing for 4 days, unable to tolerate p.o. intake. Reports had 2-3 episodes of diarrhea which resolved. Has generalized body aches, subjective fever, chills.  denies fever, cough, SOB, recent hospitalizations, dysuria. Grandchildren had Influenza positive. Lives at home independently  On presentation to ED patient found to be afebrile, BP 100/68, saturating 99% on room air. Workup revealed CMP with hyponatremia 129, hypochloremia 90, bicarb 21, BUN/creatinine 29/1.62 with GFR 36, AG 19 T. bili 1.4, LFTs normal.  Lactic acid not elevated.  CBC with leukocytosis 22.6. COVID/flu negative.  UA cloudy, large leukocytes, proteinuria, 6-10 RBC, > 50 WBC CT abdomen/pelvis shows heterogeneous bilateral renal enhancement with perinephric edema, more prominent on the left.  Question of ureteral thickening bilaterally, suspicious for UTI.  Enlarged right inguinal lymph nodes measuring 15 mm, nonspecific and may be reactive.  Consider pelvic CT follow-up in 3 months.  Sigmoid diverticulosis without diverticulitis, uterine fibroids. Urine culture sent in ER  Received 2 L NS bolus, IV Zofran , IV morphine , IV ceftriaxone  1 g    Review of Systems: As mentioned in the history of present illness. All other systems reviewed and are negative. Past Medical History:  Diagnosis Date   Anxiety    Arthritis    Breast cancer (HCC)    Cancer (HCC)    Cataract    left eye, per patient   Complication of anesthesia    DIFFICULTY WAKING UP , LAST SURGERY NECK 2012 WAS  FINE PER PT HERE AT [facility]   Depression    Fibromyalgia    Genetic testing 12/21/2016   Genetic testing reported out on 12/20/2016 through [facility]'s 46-gene Common Hereditary Cancers panel found no deleterious mutations. The following 46 genes were analyzed: APC, ATM, AXIN2, BARD1, BMPR1A, BRCA1, BRCA2, BRIP1, CDH1, CDKN2A, CHEK2, CTNNA1, DICER1, EPCAM, GREM1, KIT, MEN1, MLH1, MSH2, MSH3, MSH6, MUTYH, NBN, NF1, NTHL1, PALB2, PDGFRA, PMS2, POLD1, POLE, PTEN, RAD50, RAD51C, RAD51D, SDH   Headache    HX MIGRAINES     Heart murmur    History of radiation therapy 02/14/17-04/03/2017   left breast 60.4 Gy: 50.4 Gy delivered in 28 fractions and a boost of 10 Gy delivered in 5 fractions.   Hypertension    Macular degeneration    right eye, per patient   Malignant neoplasm of upper-outer quadrant of left female breast (HCC) 08/23/2016   Mitral valve disorder    MVP (mitral valve prolapse)    AS INFANT   Neck pain    Personal history of chemotherapy    Personal history of radiation therapy    Vision abnormalities    Past Surgical History:  Procedure Laterality Date   BREAST BIOPSY Left 2017   BREAST LUMPECTOMY Left 2017   cervical fusiion  2012   JOINT REPLACEMENT     MITRAL VALVE REPAIR     MVP 1970   PORT-A-CATH REMOVAL N/A 10/23/2017   Procedure: REMOVAL PORT-A-CATH;  Surgeon: Aron Shoulders, MD;  Location: [facility] OR;  Service: General;  Laterality: N/A;   PORTACATH PLACEMENT Right 09/06/2016   Procedure: INSERTION PORT-A-CATH;  Surgeon: Jina Nephew, MD;  Location: [facility];  Service: General;  Laterality: Right;   RADIOACTIVE SEED GUIDED PARTIAL MASTECTOMY WITH AXILLARY SENTINEL LYMPH NODE BIOPSY Left 09/06/2016   Procedure: RADIOACTIVE SEED GUIDED LEFT BREAST LUMPECTOMY  WITH SENTINEL LYMPH NODE BIOPSY;  Surgeon: Jina Nephew, MD;  Location: [facility];  Service: General;  Laterality: Left;, Per patient just lumpectomy   ROTATOR CUFF REPAIR Right 2012   TOTAL  HIP ARTHROPLASTY Right 09/14/2015   Procedure: RIGHT TOTAL HIP ARTHROPLASTY ANTERIOR APPROACH;  Surgeon: Kay CHRISTELLA Cummins, MD;  Location: [facility] OR;  Service: Orthopedics;  Laterality: Right;   TUBAL LIGATION  1988   Social History:  reports that she has been smoking cigarettes. She has been exposed to tobacco smoke. She has never used smokeless tobacco. She reports that she does not currently use drugs. She reports that she does not drink alcohol.  Allergies[1]  Family History  Adopted: Yes  Problem Relation Age of Onset   Ehlers-Danlos syndrome Sister    Breast cancer Brother 39       unknown   Healthy Son    Healthy Son    Healthy Son    Hypertension Son    Healthy Son    BRCA 1/2 Neg Hx     Prior to Admission medications  Medication Sig Start Date End Date Taking? Authorizing Provider  celecoxib  (CELEBREX ) 100 MG capsule Take 1 capsule (100 mg total) by mouth 2 (two) times daily. 01/14/24   Antonette Angeline ORN, NP  hydrochlorothiazide  (HYDRODIURIL ) 25 MG tablet TAKE 1 TABLET (25 MG TOTAL) BY MOUTH DAILY. 09/01/24   Antonette Angeline ORN, NP  ibuprofen  (ADVIL ,MOTRIN ) 200 MG tablet Take 400 mg by mouth every 6 (six) hours as needed for mild pain.    [provider]  olmesartan  (BENICAR ) 20 MG tablet Take 0.5 tablets (10 mg total) by mouth daily. 01/14/24   Antonette Angeline ORN, NP  promethazine -dextromethorphan (PROMETHAZINE -DM) 6.25-15 MG/5ML syrup Take 5 mLs by mouth 4 (four) times daily as needed. Patient not taking: Reported on 09/07/2024 01/14/24   Antonette Angeline ORN, NP  WEGOVY 1 MG/0.5ML SOAJ SQ injection  09/03/24   [provider]    Physical Exam: Vitals:   09/07/24 1451 09/07/24 1541 09/07/24 1700 09/07/24 1800  BP: 99/69  96/62 108/79  Pulse:    99  Resp: 16  16 16   Temp:  98.9 F (37.2 C)    TempSrc:  Oral    SpO2: 99%  99% 96%  Weight:      Height:       General: Awake, alert, ill appearing Cardio: RRR, no murmurs heard Lungs: Clear to auscultation bilaterally, no  wheezing, no use of accessory muscle Abdomen: Soft, mild epigastric tenderness, no guarding/rigidity, b/l CVA ttp + R > L Neuro: AO x 3, no gross focal deficit  Data Reviewed: Labs and Imaging reviewed  Assessment and Plan: Acute pyelonephritis UTI leukocytosis 22.6, LA not elevated - does not meet SIRS criteria UA cloudy, large leukocytes, proteinuria, 6-10 RBC, > 50 WBC CT abdomen/pelvis shows heterogeneous bilateral renal enhancement with perinephric edema, more prominent on the left. Continue IV Ceftriaxone  Follow blood culture, urine culture Pain management, Antiemetics prn  AKI AGMA Likely due to dehydration BUN/creatinine 29/1.62, Baseline Cr ~ 0.9-1 Continue IV fluids Monitor Cr  Hyponatremia Hypochloremia Likely due to dehydration On IV fluids Monitor BMP   Mild hyperglycemia HbA1c  HTN Hold hydrochlorothiazide  due to AKI Monitor BP  Fibromyalgia Takes Ibuprofen   at home, hold due to AKI Tylenol  and Oxycodone  prn  GERD PPI  Incidental Findings: Enlarged right inguinal lymph nodes measuring 15 mm, nonspecific and may be reactive.  Consider pelvic CT follow-up in 3 months.   Sigmoid diverticulosis without diverticulitis Uterine fibroids  -- PT/OT eval   Advance Care Planning:   Code Status: Full Code  Consults: None  Family Communication: Partner, Ricky present at the bedside  Severity of Illness: The appropriate patient status for this patient is INPATIENT. Inpatient status is judged to be reasonable and necessary in order to provide the required intensity of service to ensure the patient's safety. The patient's presenting symptoms, physical exam findings, and initial radiographic and laboratory data in the context of their chronic comorbidities is felt to place them at high risk for further clinical deterioration. Furthermore, it is not anticipated that the patient will be medically stable for discharge from the hospital within 2 midnights of  admission.   * I certify that at the point of admission it is my clinical judgment that the patient will require inpatient hospital care spanning beyond 2 midnights from the point of admission due to high intensity of service, high risk for further deterioration and high frequency of surveillance required.*  Author: Laree Lock, MD 09/07/2024 6:29 PM  For on call review [email or web address].      [1]  Allergies Allergen Reactions   Gabapentin Swelling    makes me feel detached   Naproxen Palpitations   Neulasta  [Pegfilgrastim ] Itching and Other (See Comments)    Patient stated,reddish skin tone, body on fire, tingly tongue.   Erythromycin Nausea And Vomiting   Gabapentin Other (See Comments)    Makes me feel out of my body   Neulasta  [Pegfilgrastim ] Itching   Tramadol Other (See Comments)    headache   Erythromycin Nausea And Vomiting   Naproxen Palpitations   Tramadol Hcl Other (See Comments)    Headaches   "

## 2024-09-07 NOTE — ED Provider Notes (Signed)
 " CAY RALPH PELT    CSN: [id number] Arrival date & time: 09/07/24  1016      History   Chief Complaint Chief Complaint  Patient presents with   Fever    Entered by patient    HPI Kaniesha Barile is a 63 y.o. female.  Accompanied by her husband, patient presents on day 4 of abdominal pain, nausea, vomiting, diarrhea, subjective fever.  Patient reports she has thrown up more than 10 times today but that it is mostly mucus and bile as she has not been able to tolerate oral fluids or food.  She has not taken her temperature but has felt warm and had chills.  She has not been able to take medications due to the vomiting.  2 episodes of diarrhea today so far.  No sore throat, cough, shortness of breath.  The history is provided by the patient, the spouse and medical records.    Past Medical History:  Diagnosis Date   Anxiety    Arthritis    Breast cancer (HCC)    Cancer (HCC)    Cataract    left eye, per patient   Complication of anesthesia    DIFFICULTY WAKING UP , LAST SURGERY NECK 2012 WAS FINE PER PT HERE AT [facility]   Depression    Fibromyalgia    Genetic testing 12/21/2016   Genetic testing reported out on 12/20/2016 through [facility]'s 46-gene Common Hereditary Cancers panel found no deleterious mutations. The following 46 genes were analyzed: APC, ATM, AXIN2, BARD1, BMPR1A, BRCA1, BRCA2, BRIP1, CDH1, CDKN2A, CHEK2, CTNNA1, DICER1, EPCAM, GREM1, KIT, MEN1, MLH1, MSH2, MSH3, MSH6, MUTYH, NBN, NF1, NTHL1, PALB2, PDGFRA, PMS2, POLD1, POLE, PTEN, RAD50, RAD51C, RAD51D, SDH   Headache    HX MIGRAINES     Heart murmur    History of radiation therapy 02/14/17-04/03/2017   left breast 60.4 Gy: 50.4 Gy delivered in 28 fractions and a boost of 10 Gy delivered in 5 fractions.   Hypertension    Macular degeneration    right eye, per patient   Malignant neoplasm of upper-outer quadrant of left female breast (HCC) 08/23/2016   Mitral valve disorder    MVP (mitral valve prolapse)     AS INFANT   Neck pain    Personal history of chemotherapy    Personal history of radiation therapy    Vision abnormalities     Patient Active Problem List   Diagnosis Date Noted   HLD (hyperlipidemia) 04/13/2021   Prediabetes 04/13/2021   Class 3 severe obesity due to excess calories with body mass index (BMI) of 40.0 to 44.9 in adult [facility]) 04/13/2021   Port catheter in place 10/09/2016   GERD (gastroesophageal reflux disease) 10/09/2016   History of left breast cancer 08/23/2016   Anxiety and depression 04/20/2016   Arthritis 06/27/2015   Essential hypertension 04/19/2009   Fibromyalgia 02/18/2008    Past Surgical History:  Procedure Laterality Date   BREAST BIOPSY Left 2017   BREAST LUMPECTOMY Left 2017   cervical fusiion  2012   JOINT REPLACEMENT     MITRAL VALVE REPAIR     MVP 1970   PORT-A-CATH REMOVAL N/A 10/23/2017   Procedure: REMOVAL PORT-A-CATH;  Surgeon: Aron Shoulders, MD;  Location: [facility] OR;  Service: General;  Laterality: N/A;   PORTACATH PLACEMENT Right 09/06/2016   Procedure: INSERTION PORT-A-CATH;  Surgeon: Shoulders Aron, MD;  Location: [facility];  Service: General;  Laterality: Right;   RADIOACTIVE SEED GUIDED PARTIAL MASTECTOMY WITH  AXILLARY SENTINEL LYMPH NODE BIOPSY Left 09/06/2016   Procedure: RADIOACTIVE SEED GUIDED LEFT BREAST LUMPECTOMY  WITH SENTINEL LYMPH NODE BIOPSY;  Surgeon: Jina Nephew, MD;  Location: [facility];  Service: General;  Laterality: Left;, Per patient just lumpectomy   ROTATOR CUFF REPAIR Right 2012   TOTAL HIP ARTHROPLASTY Right 09/14/2015   Procedure: RIGHT TOTAL HIP ARTHROPLASTY ANTERIOR APPROACH;  Surgeon: Kay CHRISTELLA Cummins, MD;  Location: [facility] OR;  Service: Orthopedics;  Laterality: Right;   TUBAL LIGATION  1988    OB History   No obstetric history on file.      Home Medications    Prior to Admission medications  Medication Sig Start Date End Date Taking? Authorizing Provider  WEGOVY 1  MG/0.5ML SOAJ SQ injection  09/03/24  Yes [provider]  celecoxib  (CELEBREX ) 100 MG capsule Take 1 capsule (100 mg total) by mouth 2 (two) times daily. 01/14/24   Antonette Angeline ORN, NP  hydrochlorothiazide  (HYDRODIURIL ) 25 MG tablet TAKE 1 TABLET (25 MG TOTAL) BY MOUTH DAILY. 09/01/24   Antonette Angeline ORN, NP  ibuprofen  (ADVIL ,MOTRIN ) 200 MG tablet Take 400 mg by mouth every 6 (six) hours as needed for mild pain.    [provider]  olmesartan  (BENICAR ) 20 MG tablet Take 0.5 tablets (10 mg total) by mouth daily. 01/14/24   Antonette Angeline ORN, NP  promethazine -dextromethorphan (PROMETHAZINE -DM) 6.25-15 MG/5ML syrup Take 5 mLs by mouth 4 (four) times daily as needed. Patient not taking: Reported on 09/07/2024 01/14/24   Antonette Angeline ORN, NP    Family History Family History  Adopted: Yes  Problem Relation Age of Onset   Ehlers-Danlos syndrome Sister    Breast cancer Brother 67       unknown   Healthy Son    Healthy Son    Healthy Son    Hypertension Son    Healthy Son    BRCA 1/2 Neg Hx     Social History Social History[1]   Allergies   Gabapentin, Naproxen, Neulasta  [pegfilgrastim ], Erythromycin, Gabapentin, Neulasta  [pegfilgrastim ], Tramadol, Erythromycin, Naproxen, and Tramadol hcl   Review of Systems Review of Systems  Constitutional:  Positive for chills and fever.  HENT:  Negative for ear pain and sore throat.   Respiratory:  Negative for cough and shortness of breath.   Cardiovascular:  Negative for chest pain and palpitations.  Gastrointestinal:  Positive for abdominal pain, diarrhea, nausea and vomiting.     Physical Exam Triage Vital Signs ED Triage Vitals  Encounter Vitals Group     BP 09/07/24 1036 100/68     Girls Systolic BP Percentile --      Girls Diastolic BP Percentile --      Boys Systolic BP Percentile --      Boys Diastolic BP Percentile --      Pulse --      Resp --      Temp --      Temp src --      SpO2 --      Weight --       Height --      Head Circumference --      Peak Flow --      Pain Score 09/07/24 1035 4     Pain Loc --      Pain Education --      Exclude from Growth Chart --    No data found.  Updated Vital Signs BP 100/68   Pulse 97   Temp 97.9  F (36.6 C)   Resp 20   LMP 08/29/2016   SpO2 97%   Visual Acuity Right Eye Distance:   Left Eye Distance:   Bilateral Distance:    Right Eye Near:   Left Eye Near:    Bilateral Near:     Physical Exam Constitutional:      General: She is not in acute distress.    Appearance: She is ill-appearing.     Comments: Moaning and tearful throughout exam.  HENT:     Mouth/Throat:     Mouth: Mucous membranes are dry.  Cardiovascular:     Rate and Rhythm: Normal rate and regular rhythm.     Heart sounds: Normal heart sounds.  Pulmonary:     Effort: Pulmonary effort is normal. No respiratory distress.     Breath sounds: Normal breath sounds.  Abdominal:     General: Bowel sounds are normal.     Palpations: Abdomen is soft.     Tenderness: There is abdominal tenderness in the epigastric area and left upper quadrant. There is guarding. There is no rebound.  Neurological:     Mental Status: She is alert.      UC Treatments / Results  Labs (all labs ordered are listed, but only abnormal results are displayed) Labs Reviewed  POC COVID19/FLU A&B COMBO    EKG   Radiology No results found.  Procedures Procedures (including critical care time)  Medications Ordered in UC Medications  ondansetron  (ZOFRAN -ODT) disintegrating tablet 4 mg (4 mg Oral Given 09/07/24 1044)    Initial Impression / Assessment and Plan / UC Course  I have reviewed the triage vital signs and the nursing notes.  Pertinent labs & imaging results that were available during my care of the patient were reviewed by me and considered in my medical decision making (see chart for details).    Upper abdominal pain, intractable vomiting, nausea vomiting and diarrhea.   Blood pressure 100/68.  Patient appears dehydrated.  Zofran  given here and patient still vomiting.  Her abdomen is tender to palpation.  Sending her to the ED for evaluation.  Her husband is with her and will drive her to [facility] ED now.  Final Clinical Impressions(s) / UC Diagnoses   Final diagnoses:  Pain of upper abdomen  Nausea vomiting and diarrhea  Intractable vomiting     Discharge Instructions      Go to the emergency department for evaluation of your abdominal pain, nausea vomiting and diarrhea.     ED Prescriptions   None    PDMP not reviewed this encounter.    [1]  Social History Tobacco Use   Smoking status: Every Day    Current packs/day: 0.50    Types: Cigarettes    Passive exposure: Past   Smokeless tobacco: Never  Vaping Use   Vaping status: Never Used  Substance Use Topics   Alcohol use: Never   Drug use: Not Currently     Corlis Burnard DEL, NP 09/07/24 1119  "

## 2024-09-07 NOTE — ED Provider Notes (Signed)
 "  [facility] Provider Note    Event Date/Time   First MD Initiated Contact with Patient 09/07/24 1201     (approximate)   History   Emesis and Abdominal Pain   HPI  Stacy [name] is a 63 y.o. female with history of GERD, hypertension who presents with complaints of nausea vomiting abdominal pain as well as diarrhea.  Symptoms started several days ago.  She does report body aches, no fevers.     Physical Exam   Triage Vital Signs: ED Triage Vitals  Encounter Vitals Group     BP 09/07/24 1134 (!) 168/113     Girls Systolic BP Percentile --      Girls Diastolic BP Percentile --      Boys Systolic BP Percentile --      Boys Diastolic BP Percentile --      Pulse Rate 09/07/24 1134 84     Resp --      Temp 09/07/24 1134 (!) 97.4 F (36.3 C)     Temp Source 09/07/24 1134 Oral     SpO2 09/07/24 1134 96 %     Weight 09/07/24 1132 97.5 kg (215 lb)     Height 09/07/24 1132 1.6 m (5' 3)     Head Circumference --      Peak Flow --      Pain Score 09/07/24 1132 5     Pain Loc --      Pain Education --      Exclude from Growth Chart --     Most recent vital signs: Vitals:   09/07/24 1134  BP: (!) 168/113  Pulse: 84  Temp: (!) 97.4 F (36.3 C)  SpO2: 96%     General: Awake, no distress.  CV:  Good peripheral perfusion.  Resp:  Normal effort.  Abd:  No distention.  Mild epigastric tenderness, otherwise reassuring exam, soft, no rebound Other:     ED Results / Procedures / Treatments   Labs (all labs ordered are listed, but only abnormal results are displayed) Labs Reviewed  COMPREHENSIVE METABOLIC PANEL WITH GFR - Abnormal; Notable for the following components:      Result Value   Sodium 129 (*)    Chloride 90 (*)    CO2 21 (*)    Glucose, Bld 140 (*)    BUN 29 (*)    Creatinine, Ser 1.62 (*)    Total Bilirubin 1.4 (*)    GFR, Estimated 36 (*)    Anion gap 19 (*)    All other components within normal limits  CBC -  Abnormal; Notable for the following components:   WBC 22.6 (*)    All other components within normal limits  LIPASE, BLOOD  URINALYSIS, ROUTINE W REFLEX MICROSCOPIC     EKG ED ECG REPORT I, Lamar Price, the attending physician, personally viewed and interpreted this ECG.  Date: 09/07/2024  Rhythm: normal sinus rhythm QRS Axis: normal Intervals: normal ST/T Wave abnormalities: normal Narrative Interpretation: no evidence of acute ischemia     RADIOLOGY     PROCEDURES:  Critical Care performed:   Procedures   MEDICATIONS ORDERED IN ED: Medications  ondansetron  (ZOFRAN ) injection 4 mg (4 mg Intravenous Given 09/07/24 1336)  0.9 %  sodium chloride  infusion (0 mLs Intravenous Stopped 09/07/24 1423)  morphine  (PF) 4 MG/ML injection 4 mg (4 mg Intravenous Given 09/07/24 1337)     IMPRESSION / MDM / ASSESSMENT AND PLAN / ED COURSE  I  reviewed the triage vital signs and the nursing notes. Patient's presentation is most consistent with acute presentation with potential threat to life or bodily function.  Patient presents with nausea vomiting diarrhea abdominal pain as described above.  Differential includes viral gastroenteritis, doubt pancreatitis, colitis  Will treat with IV morphine , IV Zofran , IV fluids  Labs reviewed, noted elevated white blood cell count, this can frequently be seen in viral gastroenteritis,   ----------------------------------------- 2:45 PM on 09/07/2024 ----------------------------------------- Patient complains of rocks in her stomach , given elevated white blood cell count and continued pain will send for CT abdomen pelvis, last night colleague to follow-up on CT results.      FINAL CLINICAL IMPRESSION(S) / ED DIAGNOSES   Final diagnoses:  Gastroenteritis     Rx / DC Orders   ED Discharge Orders     None        Note:  This document was prepared using Dragon voice recognition software and may include unintentional  dictation errors.   Arlander Charleston, MD 09/07/24 1445  "

## 2024-09-07 NOTE — ED Provider Notes (Signed)
 CT abd/pel IMPRESSION:  1. Heterogeneous bilateral renal enhancement with perinephric edema,  more prominent on the left. Question of ureteral thickening  bilaterally. Findings are suspicious for urinary tract infection.  Recommend correlation with urinalysis.  2. Enlarged right inguinal lymph node measuring 15 mm short axis.  This is nonspecific and may be reactive. Recommend correlation for  any infectious or inflammatory process in the right lower extremity.  Consider pelvic CT follow-up in 3 months.  3. Sigmoid diverticulosis without diverticulitis.  4. Uterine fibroid.   CT scan concerning for pyelonephritis.  I discussed this with the patient.  Given the concern for pyelonephritis, leukocytosis, AKI and hyponatremia I do feel patient would benefit from further management.  Will discuss with the hospitalist service. Discussed plan for admission with the patient.   Floy Roberts, MD 09/07/24 [phone]

## 2024-09-07 NOTE — Discharge Instructions (Signed)
 Go to the emergency department for evaluation of your abdominal pain, nausea vomiting and diarrhea.

## 2024-09-08 DIAGNOSIS — N12 Tubulo-interstitial nephritis, not specified as acute or chronic: Secondary | ICD-10-CM | POA: Diagnosis not present

## 2024-09-08 LAB — BASIC METABOLIC PANEL WITH GFR
Anion gap: 14 (ref 5–15)
Anion gap: 10 (ref 5–15)
BUN: 29 mg/dL — ABNORMAL HIGH (ref 6–20)
BUN: 25 mg/dL — ABNORMAL HIGH (ref 6–20)
CO2: 20 mmol/L — ABNORMAL LOW (ref 22–32)
CO2: 23 mmol/L (ref 22–32)
Calcium: 8.1 mg/dL — ABNORMAL LOW (ref 8.9–10.3)
Calcium: 8.2 mg/dL — ABNORMAL LOW (ref 8.9–10.3)
Chloride: 93 mmol/L — ABNORMAL LOW (ref 98–111)
Chloride: 92 mmol/L — ABNORMAL LOW (ref 98–111)
Creatinine, Ser: 1.77 mg/dL — ABNORMAL HIGH (ref 0.44–1.00)
Creatinine, Ser: 1.77 mg/dL — ABNORMAL HIGH (ref 0.44–1.00)
GFR, Estimated: 32 mL/min — ABNORMAL LOW
GFR, Estimated: 32 mL/min — ABNORMAL LOW
Glucose, Bld: 108 mg/dL — ABNORMAL HIGH (ref 70–99)
Glucose, Bld: 127 mg/dL — ABNORMAL HIGH (ref 70–99)
Potassium: 3.4 mmol/L — ABNORMAL LOW (ref 3.5–5.1)
Potassium: 4.3 mmol/L (ref 3.5–5.1)
Sodium: 127 mmol/L — ABNORMAL LOW (ref 135–145)
Sodium: 126 mmol/L — ABNORMAL LOW (ref 135–145)

## 2024-09-08 LAB — CBC
HCT: 34.6 % — ABNORMAL LOW (ref 36.0–46.0)
Hemoglobin: 12.1 g/dL (ref 12.0–15.0)
MCH: 29.9 pg (ref 26.0–34.0)
MCHC: 35 g/dL (ref 30.0–36.0)
MCV: 85.4 fL (ref 80.0–100.0)
Platelets: 201 K/uL (ref 150–400)
RBC: 4.05 MIL/uL (ref 3.87–5.11)
RDW: 12.9 % (ref 11.5–15.5)
WBC: 18.6 K/uL — ABNORMAL HIGH (ref 4.0–10.5)
nRBC: 0 % (ref 0.0–0.2)

## 2024-09-08 LAB — HEMOGLOBIN A1C
Hgb A1c MFr Bld: 5.6 % (ref 4.8–5.6)
Mean Plasma Glucose: 114.02 mg/dL

## 2024-09-08 LAB — CREATININE, URINE, RANDOM: Creatinine, Urine: 55 mg/dL

## 2024-09-08 LAB — MAGNESIUM: Magnesium: 2.3 mg/dL (ref 1.7–2.4)

## 2024-09-08 MED ORDER — HYDROMORPHONE HCL 1 MG/ML IJ SOLN
0.5000 mg | INTRAMUSCULAR | Status: DC | PRN
  Administered 2024-09-08 – 2024-09-09 (×2): 0.5 mg via INTRAVENOUS
  Filled 2024-09-08 (×2): qty 0.5

## 2024-09-08 MED ORDER — POTASSIUM CHLORIDE CRYS ER 20 MEQ PO TBCR
40.0000 meq | EXTENDED_RELEASE_TABLET | Freq: Once | ORAL | Status: AC
  Administered 2024-09-08: 40 meq via ORAL
  Filled 2024-09-08: qty 2

## 2024-09-08 MED ORDER — PIPERACILLIN-TAZOBACTAM 3.375 G IVPB
3.3750 g | Freq: Three times a day (TID) | INTRAVENOUS | Status: DC
  Administered 2024-09-08 – 2024-09-09 (×3): 3.375 g via INTRAVENOUS
  Filled 2024-09-08 (×3): qty 50

## 2024-09-08 NOTE — Progress Notes (Signed)
 PT Cancellation Note  Patient Details Name: Stacy [name] MRN: [id number] DOB: 10-23-1960   Cancelled Treatment:    Reason Eval/Treat Not Completed: Other (comment) PT orders received, chart reviewed. Pt received in the recliner, lying down. Pt repeatedly states she cannot participate right now & needs to rest. Will f/u as able.  Richerd Pinal, PT, DPT 09/08/2024, 2:34 PM  Richerd CHRISTELLA Pinal 09/08/2024, 2:34 PM

## 2024-09-08 NOTE — Consult Note (Signed)
 " [facility]  CONSULT NOTE    Date: 09/08/2024                  Patient Name:  Stacy [name]  MRN: [id number]  DOB: Oct 14, 1960  Age / Sex: 63 y.o., female         PCP: Antonette Angeline ORN, NP                 Service Requesting Consult: TRH                 Reason for Consult: Acute kidney injury            History of Present Illness: Ms. Moniqua Engebretsen is a 63 y.o.  female with past medical conditions including GERD, hypertension, and fibromyalgia, who was admitted to [facility] on 09/07/2024 for Pyelonephritis [N12] AKI (acute kidney injury) [N17.9]  Patient presents to the emergency department with nausea, vomiting, diarrhea and epigastric pain.  Patient initially went to urgent care and was sent to the emergency department.  Patient states the symptoms began over the weekend and have progressively worsened.  Patient states she was too weak to come earlier for evaluation.  She is seen in recliner.  Currently nauseated.  Currently complaining of abdominal pain.  States she has not been able to eat or drink much since the weekend.  Everything she did consume she vomited.  Labs on ED arrival concerning for sodium 129, serum bicarb 21, glucose 140, BUN 29, creatinine 1.62 with GFR 36, and white count 22.6.  Respiratory POC negative for COVID, influenza A and B.  UA appears cloudy with leukocytes and mild proteins.  Urine culture pending.  CT abdomen pelvis shows uterine fibroids, heterogeneous bilateral renal enhancements with perinephric edema suspicious for UTI.  Normal renal function noted in April of this year.  No recent labs since that time.   Medications: Outpatient medications: Medications Prior to Admission  Medication Sig Dispense Refill Last Dose/Taking   celecoxib  (CELEBREX ) 100 MG capsule Take 1 capsule (100 mg total) by mouth 2 (two) times daily. 180 capsule 1    hydrochlorothiazide  (HYDRODIURIL ) 25 MG tablet TAKE 1 TABLET (25 MG TOTAL) BY MOUTH  DAILY. 90 tablet 0    ibuprofen  (ADVIL ,MOTRIN ) 200 MG tablet Take 400 mg by mouth every 6 (six) hours as needed for mild pain.      olmesartan  (BENICAR ) 20 MG tablet Take 0.5 tablets (10 mg total) by mouth daily. 45 tablet 1    promethazine -dextromethorphan (PROMETHAZINE -DM) 6.25-15 MG/5ML syrup Take 5 mLs by mouth 4 (four) times daily as needed. (Patient not taking: Reported on 09/07/2024) 118 mL 0    WEGOVY 1 MG/0.5ML SOAJ SQ injection        Current medications: Current Facility-Administered Medications  Medication Dose Route Frequency Provider Last Rate Last Admin   acetaminophen  (TYLENOL ) tablet 650 mg  650 mg Oral Q6H PRN Ponnala, Shruthi, MD   650 mg at 09/07/24 2350   enoxaparin  (LOVENOX ) injection 40 mg  40 mg Subcutaneous QHS Ponnala, Shruthi, MD   40 mg at 09/07/24 2158   lactated ringers  infusion   Intravenous Continuous Donati-Garmon, Natalie M, NP 125 mL/hr at 09/08/24 1302 Infusion Verify at 09/08/24 1302   ondansetron  (ZOFRAN ) injection 4 mg  4 mg Intravenous Q6H PRN Ponnala, Shruthi, MD   4 mg at 09/08/24 1309   oxyCODONE  (Oxy IR/ROXICODONE ) immediate release tablet 2.5 mg  2.5 mg Oral Q4H PRN Jerelene Critchley, MD  oxyCODONE  (Oxy IR/ROXICODONE ) immediate release tablet 5 mg  5 mg Oral Q4H PRN Ponnala, Shruthi, MD   5 mg at 09/08/24 1309   pantoprazole  (PROTONIX ) EC tablet 40 mg  40 mg Oral Daily Ponnala, Shruthi, MD   40 mg at 09/08/24 9197   piperacillin -tazobactam (ZOSYN ) IVPB 3.375 g  3.375 g Intravenous Q8H Ponnala, Shruthi, MD          Allergies: Allergies[1]    Past Medical History: Past Medical History:  Diagnosis Date   Anxiety    Arthritis    Breast cancer (HCC)    Cancer (HCC)    Cataract    left eye, per patient   Complication of anesthesia    DIFFICULTY WAKING UP , LAST SURGERY NECK 2012 WAS FINE PER PT HERE AT [facility]   Depression    Fibromyalgia    Genetic testing 12/21/2016   Genetic testing reported out on 12/20/2016 through [facility]'s 46-gene  Common Hereditary Cancers panel found no deleterious mutations. The following 46 genes were analyzed: APC, ATM, AXIN2, BARD1, BMPR1A, BRCA1, BRCA2, BRIP1, CDH1, CDKN2A, CHEK2, CTNNA1, DICER1, EPCAM, GREM1, KIT, MEN1, MLH1, MSH2, MSH3, MSH6, MUTYH, NBN, NF1, NTHL1, PALB2, PDGFRA, PMS2, POLD1, POLE, PTEN, RAD50, RAD51C, RAD51D, SDH   Headache    HX MIGRAINES     Heart murmur    History of radiation therapy 02/14/17-04/03/2017   left breast 60.4 Gy: 50.4 Gy delivered in 28 fractions and a boost of 10 Gy delivered in 5 fractions.   Hypertension    Macular degeneration    right eye, per patient   Malignant neoplasm of upper-outer quadrant of left female breast (HCC) 08/23/2016   Mitral valve disorder    MVP (mitral valve prolapse)    AS INFANT   Neck pain    Personal history of chemotherapy    Personal history of radiation therapy    Vision abnormalities      Past Surgical History: Past Surgical History:  Procedure Laterality Date   BREAST BIOPSY Left 2017   BREAST LUMPECTOMY Left 2017   cervical fusiion  2012   JOINT REPLACEMENT     MITRAL VALVE REPAIR     MVP 1970   PORT-A-CATH REMOVAL N/A 10/23/2017   Procedure: REMOVAL PORT-A-CATH;  Surgeon: Aron Shoulders, MD;  Location: [facility] OR;  Service: General;  Laterality: N/A;   PORTACATH PLACEMENT Right 09/06/2016   Procedure: INSERTION PORT-A-CATH;  Surgeon: Shoulders Aron, MD;  Location: [facility];  Service: General;  Laterality: Right;   RADIOACTIVE SEED GUIDED PARTIAL MASTECTOMY WITH AXILLARY SENTINEL LYMPH NODE BIOPSY Left 09/06/2016   Procedure: RADIOACTIVE SEED GUIDED LEFT BREAST LUMPECTOMY  WITH SENTINEL LYMPH NODE BIOPSY;  Surgeon: Shoulders Aron, MD;  Location: [facility];  Service: General;  Laterality: Left;, Per patient just lumpectomy   ROTATOR CUFF REPAIR Right 2012   TOTAL HIP ARTHROPLASTY Right 09/14/2015   Procedure: RIGHT TOTAL HIP ARTHROPLASTY ANTERIOR APPROACH;  Surgeon: Kay CHRISTELLA Cummins, MD;   Location: [facility] OR;  Service: Orthopedics;  Laterality: Right;   TUBAL LIGATION  1988     Family History: Family History  Adopted: Yes  Problem Relation Age of Onset   Ehlers-Danlos syndrome Sister    Breast cancer Brother 70       unknown   Healthy Son    Healthy Son    Healthy Son    Hypertension Son    Healthy Son    BRCA 1/2 Neg Hx      Social History: Social  History   Socioeconomic History   Marital status: Married    Spouse name: Not on file   Number of children: 4   Years of education: Not on file   Highest education level: Some college, no degree  Occupational History   Not on file  Tobacco Use   Smoking status: Every Day    Current packs/day: 0.50    Types: Cigarettes    Passive exposure: Past   Smokeless tobacco: Never  Vaping Use   Vaping status: Never Used  Substance and Sexual Activity   Alcohol use: Never   Drug use: Not Currently   Sexual activity: Yes    Birth control/protection: Post-menopausal  Other Topics Concern   Not on file  Social History Narrative   ** Merged History Encounter **       Social Drivers of Health   Tobacco Use: High Risk (09/07/2024)   Patient History    Smoking Tobacco Use: Every Day    Smokeless Tobacco Use: Never    Passive Exposure: Past  Financial Resource Strain: Medium Risk (01/09/2024)   Overall Financial Resource Strain (CARDIA)    Difficulty of Paying Living Expenses: Somewhat hard  Food Insecurity: No Food Insecurity (09/07/2024)   Epic    Worried About Radiation Protection Practitioner of Food in the Last Year: Never true    Ran Out of Food in the Last Year: Never true  Transportation Needs: No Transportation Needs (09/07/2024)   Epic    Lack of Transportation (Medical): No    Lack of Transportation (Non-Medical): No  Physical Activity: Insufficiently Active (01/09/2024)   Exercise Vital Sign    Days of Exercise per Week: 1 day    Minutes of Exercise per Session: 20 min  Stress: Stress Concern Present (01/09/2024)    [employer] of Occupational Health - Occupational Stress Questionnaire    Feeling of Stress : Very much  Social Connections: Moderately Isolated (01/09/2024)   Social Connection and Isolation Panel    Frequency of Communication with Friends and Family: More than three times a week    Frequency of Social Gatherings with Friends and Family: Once a week    Attends Religious Services: 1 to 4 times per year    Active Member of [employer] or Organizations: No    Attends Banker Meetings: Not on file    Marital Status: Separated  Intimate Partner Violence: Not At Risk (09/07/2024)   Epic    Fear of Current or Ex-Partner: No    Emotionally Abused: No    Physically Abused: No    Sexually Abused: No  Depression (PHQ2-9): High Risk (01/14/2024)   Depression (PHQ2-9)    PHQ-2 Score: 14  Alcohol Screen: Low Risk (07/18/2023)   Alcohol Screen    Last Alcohol Screening Score (AUDIT): 0  Housing: Low Risk (09/07/2024)   Epic    Unable to Pay for Housing in the Last Year: No    Number of Times Moved in the Last Year: 0    Homeless in the Last Year: No  Utilities: Not At Risk (09/08/2024)   Epic    Threatened with loss of utilities: No  Health Literacy: Not on file     Review of Systems: Review of Systems  Constitutional:  Positive for fever. Negative for chills and malaise/fatigue.  HENT:  Negative for congestion, sore throat and tinnitus.   Eyes:  Negative for blurred vision and redness.  Respiratory:  Negative for cough, shortness of breath and wheezing.   Cardiovascular:  Negative for chest pain, palpitations, claudication and leg swelling.  Gastrointestinal:  Positive for diarrhea, nausea and vomiting. Negative for abdominal pain and blood in stool.  Genitourinary:  Negative for flank pain, frequency and hematuria.  Musculoskeletal:  Negative for back pain, falls and myalgias.  Skin:  Negative for rash.  Neurological:  Negative for dizziness, weakness and headaches.   Endo/Heme/Allergies:  Does not bruise/bleed easily.  Psychiatric/Behavioral:  Negative for depression. The patient is not nervous/anxious and does not have insomnia.     Vital Signs: Blood pressure (!) 99/52, pulse 85, temperature 98.9 F (37.2 C), resp. rate 15, height 5' 3 (1.6 m), weight 97.5 kg, last menstrual period 08/29/2016, SpO2 96%.  Weight trends: Filed Weights   09/07/24 1132  Weight: 97.5 kg    Physical Exam: General: Ill appearing  Head: Normocephalic, atraumatic. dry oral mucosal membranes  Eyes: Anicteric  Lungs:  Clear to auscultation, room air  Heart: Regular rate and rhythm  Abdomen:  Soft, tender  Extremities:  No peripheral edema.  Neurologic: Alert, oriented  Skin: No lesions        Lab results: Basic Metabolic Panel: Recent Labs  Lab 09/07/24 1136 09/08/24 0442  NA 129* 127*  K 3.7 3.4*  CL 90* 93*  CO2 21* 20*  GLUCOSE 140* 108*  BUN 29* 29*  CREATININE 1.62* 1.77*  CALCIUM 9.1 8.1*  MG  --  2.3    Liver Function Tests: Recent Labs  Lab 09/07/24 1136  AST 23  ALT 14  ALKPHOS 83  BILITOT 1.4*  PROT 7.8  ALBUMIN  3.8   Recent Labs  Lab 09/07/24 1136  LIPASE 17   No results for input(s): AMMONIA in the last 168 hours.  CBC: Recent Labs  Lab 09/07/24 1136 09/08/24 0442  WBC 22.6* 18.6*  HGB 14.7 12.1  HCT 42.6 34.6*  MCV 85.7 85.4  PLT 229 201    Cardiac Enzymes: No results for input(s): CKTOTAL, CKMB, CKMBINDEX, TROPONINI in the last 168 hours.  BNP: Invalid input(s): POCBNP  CBG: No results for input(s): GLUCAP in the last 168 hours.  Microbiology: Results for orders placed or performed during the hospital encounter of 09/07/24  Culture, blood (Routine X 2) w Reflex to ID Panel     Status: None (Preliminary result)   Collection Time: 09/07/24  6:26 PM   Specimen: BLOOD  Result Value Ref Range Status   Specimen Description BLOOD RIGHT ANTECUBITAL  Final   Special Requests   Final     BOTTLES DRAWN AEROBIC AND ANAEROBIC Blood Culture adequate volume   Culture   Final    NO GROWTH < 24 HOURS Performed at [facility], [address], [place], [place] [address]    Report Status PENDING  Incomplete  Culture, blood (Routine X 2) w Reflex to ID Panel     Status: None (Preliminary result)   Collection Time: 09/07/24  6:26 PM   Specimen: BLOOD  Result Value Ref Range Status   Specimen Description BLOOD BLOOD LEFT FOREARM  Final   Special Requests   Final    BOTTLES DRAWN AEROBIC AND ANAEROBIC Blood Culture results may not be optimal due to an inadequate volume of blood received in culture bottles   Culture   Final    NO GROWTH < 24 HOURS Performed at [facility], [address] [place], [place] [address]    Report Status PENDING  Incomplete    Coagulation Studies: No results for input(s): LABPROT, INR in  the last 72 hours.  Urinalysis: Recent Labs    09/07/24 1449  COLORURINE YELLOW*  LABSPEC 1.006  PHURINE 5.0  GLUCOSEU NEGATIVE  HGBUR MODERATE*  BILIRUBINUR NEGATIVE  KETONESUR NEGATIVE  PROTEINUR 100*  NITRITE NEGATIVE  LEUKOCYTESUR LARGE*      Imaging: CT ABDOMEN PELVIS W CONTRAST Result Date: 09/07/2024 CLINICAL DATA:  Acute abdominal pain. EXAM: CT ABDOMEN AND PELVIS WITH CONTRAST TECHNIQUE: Multidetector CT imaging of the abdomen and pelvis was performed using the standard protocol following bolus administration of intravenous contrast. RADIATION DOSE REDUCTION: This exam was performed according to the departmental dose-optimization program which includes automated exposure control, adjustment of the mA and/or kV according to patient size and/or use of iterative reconstruction technique. CONTRAST:  80mL OMNIPAQUE  IOHEXOL  300 MG/ML  SOLN COMPARISON:  03/11/2017 FINDINGS: Lower chest: Subsegmental atelectasis in the dependent right and left lower lobe. No pleural fluid. Hepatobiliary: Few scattered tiny hypodensities in liver are  too small to characterize, but of doubtful clinical significance. These are unchanged from prior exam. Gallbladder physiologically distended, no calcified stone. No biliary dilatation. Pancreas: No ductal dilatation or inflammation. Spleen: Normal in size without focal abnormality. Adrenals/Urinary Tract: Bilateral adrenal thickening without nodule. Lobulated renal contours with areas of cortical scarring, more so in the right kidney. Heterogeneous bilateral renal enhancement. Bilateral perinephric edema is more prominent on the left. Question of ureteral thickening bilaterally. No hydronephrosis. No renal calculi. Unremarkable urinary bladder. Stomach/Bowel: No bowel obstruction or inflammation. The appendix is normal. Tiny hiatal hernia. Small volume of formed stool in the colon. Sigmoid diverticulosis without diverticulitis. Vascular/Lymphatic: Aortic atherosclerosis. No aortic aneurysm. Circumaortic left renal vein. Patent portal vein. Enlarged right inguinal lymph node measures 15 mm short axis. There are small adjacent prominent right inguinal nodes. No other enlarged lymph nodes in the abdomen or pelvis. Reproductive: 3.6 cm fundal fibroid in the uterus.  No adnexal mass. Other: No free air or ascites.  Tiny fat containing umbilical hernia Musculoskeletal: Right hip arthroplasty. Degenerative change throughout the spine. IMPRESSION: 1. Heterogeneous bilateral renal enhancement with perinephric edema, more prominent on the left. Question of ureteral thickening bilaterally. Findings are suspicious for urinary tract infection. Recommend correlation with urinalysis. 2. Enlarged right inguinal lymph node measuring 15 mm short axis. This is nonspecific and may be reactive. Recommend correlation for any infectious or inflammatory process in the right lower extremity. Consider pelvic CT follow-up in 3 months. 3. Sigmoid diverticulosis without diverticulitis. 4. Uterine fibroid. Aortic Atherosclerosis (ICD10-I70.0).  Electronically Signed   By: Andrea Gasman M.D.   On: 09/07/2024 17:29     Assessment & Plan: Ms. Aaisha Sliter is a 63 y.o.  female with past medical conditions including GERD, hypertension, and fibromyalgia, who was admitted to [facility] on 09/07/2024 for Pyelonephritis [N12] AKI (acute kidney injury) [N17.9]   Acute kidney injury likely secondary to dehydration and infectious process.  Normal renal function noted in April of this year.  UA positive for UTI, CT abdomen pelvis confirming pyelonephritis as well.  Agree with IV fluids.  Expect condition to improve with treatment of underlying causes.  No acute indication for dialysis.  Continue to avoid nephrotoxic agents and therapies.  We will follow along.  2.  Acute pyelonephritis, CT abdomen pelvis showed bilateral renal enhancement with perinephric edema, left greater than right.  Primary team has ordered IV ceftriaxone .  Awaiting blood and urine cultures.  Continue supportive measures.  3.  Hypotension with history of hypertension.  Olmesartan  and hydrochlorothiazide  currently held.  Patient  received 1 dose of midodrine  5 mg yesterday.  Blood pressure 99/52 this morning.   LOS: 1 Detra Bores 12/23/20251:44 PM      [1]  Allergies Allergen Reactions   Gabapentin Swelling    makes me feel detached   Naproxen Palpitations   Neulasta  [Pegfilgrastim ] Itching and Other (See Comments)    Patient stated,reddish skin tone, body on fire, tingly tongue.   Erythromycin Nausea And Vomiting   Gabapentin Other (See Comments)    Makes me feel out of my body   Neulasta  [Pegfilgrastim ] Itching   Tramadol Other (See Comments)    headache   Erythromycin Nausea And Vomiting   Naproxen Palpitations   Tramadol Hcl Other (See Comments)    Headaches   "

## 2024-09-08 NOTE — Progress Notes (Signed)
 " PROGRESS NOTE    Stacy [name]  FMW:[id number] DOB: 01-02-1961 DOA: 09/07/2024 PCP: Stacy [name] ORN, NP  Chief Complaint  Patient presents with   Emesis   Abdominal Pain    Hospital Course:  Stacy [name] is a 63 y.o. female with medical history significant of PMH GERD, HTN, Fibromyalgia presented with nausea/vomiting, abdominal pain, ongoing for 4 days, unable to tolerate p.o. intake. Reports had 2-3 episodes of diarrhea which resolved. Has generalized body aches, subjective fever, chills.  Patient was admitted for UTI/acute pyelonephritis, AKI, hyponatremia.  Hospital course as below  Subjective: Patient was examined at the bedside, partner present. States she still feels weak, nauseous and unable to tolerate p.o. intake. Spiked fever overnight 100.7 F Neurology consulted for AKI, hyponatremia   Objective: Vitals:   09/08/24 0203 09/08/24 0306 09/08/24 0419 09/08/24 0816  BP: (!) 85/57 (!) 80/57 119/65 (!) 99/52  Pulse: 82 82 91 85  Resp: 18 18 18 15   Temp: 98.1 F (36.7 C) 98.2 F (36.8 C) 98.9 F (37.2 C) 98.9 F (37.2 C)  TempSrc:   Oral   SpO2: 95% 93% 92% 96%  Weight:      Height:        Intake/Output Summary (Last 24 hours) at 09/08/2024 9091 Last data filed at 09/08/2024 0425 Gross per 24 hour  Intake 680.19 ml  Output --  Net 680.19 ml   Filed Weights   09/07/24 1132  Weight: 97.5 kg    Examination: General: Awake, alert, ill appearing Cardio: RRR, no murmurs heard Lungs: Clear to auscultation bilaterally, no wheezing, no use of accessory muscle Abdomen: Soft, mild epigastric tenderness, no guarding/rigidity, b/l CVA ttp + R > L Neuro: AO x 3, no gross focal deficit  Assessment & Plan:  Acute pyelonephritis UTI leukocytosis improving CT abdomen/pelvis shows heterogeneous bilateral renal enhancement with perinephric edema, more prominent on the left. Change IV Ceftriaxone  to IV Zosyn  Follow blood culture, urine culture Pain  management, Antiemetics prn   AKI AGMA Likely due to dehydration BUN/creatinine 29/1.62 -> 1.77 , Baseline Cr ~ 0.9-1 Continue IV fluids Monitor Cr   Hyponatremia Hypochloremia Likely due to dehydration On IV fluids Urine osmolality, serum osmolality, urine sodium pending Nephrology following Monitor BMP   Hypokalemia Monitor and replete as needed   HTN Hold hydrochlorothiazide  due to AKI Monitor BP   Fibromyalgia Takes Ibuprofen  at home, hold due to AKI Tylenol  and Oxycodone  prn   GERD PPI   Incidental Findings: Enlarged right inguinal lymph nodes measuring 15 mm, nonspecific and may be reactive.  Consider pelvic CT follow-up in 3 months.   Sigmoid diverticulosis without diverticulitis Uterine fibroids  Obesity class II BMI 38.09 Outpatient follow up for lifestyle modification and risk factor management   -- PT/OT eval  DVT prophylaxis: Lovenox  SQ   Code Status: Full Code Disposition:  TBD  Consultants:  Nephrology  Procedures:  None  Antimicrobials:  Anti-infectives (From admission, onward)    Start     Dose/Rate Route Frequency Ordered Stop   09/08/24 1600  cefTRIAXone  (ROCEPHIN ) 1 g in sodium chloride  0.9 % 100 mL IVPB        1 g 200 mL/hr over 30 Minutes Intravenous Every 24 hours 09/07/24 1825     09/07/24 1530  cefTRIAXone  (ROCEPHIN ) 1 g in sodium chloride  0.9 % 100 mL IVPB        1 g 200 mL/hr over 30 Minutes Intravenous  Once 09/07/24 1525 09/07/24 1610  Data Reviewed: I have personally reviewed following labs and imaging studies CBC: Recent Labs  Lab 09/07/24 1136 09/08/24 0442  WBC 22.6* 18.6*  HGB 14.7 12.1  HCT 42.6 34.6*  MCV 85.7 85.4  PLT 229 201   Basic Metabolic Panel: Recent Labs  Lab 09/07/24 1136 09/08/24 0442  NA 129* 127*  K 3.7 3.4*  CL 90* 93*  CO2 21* 20*  GLUCOSE 140* 108*  BUN 29* 29*  CREATININE 1.62* 1.77*  CALCIUM 9.1 8.1*  MG  --  2.3   GFR: Estimated Creatinine Clearance: 37.6 mL/min  (A) (by C-G formula based on SCr of 1.77 mg/dL (H)). Liver Function Tests: Recent Labs  Lab 09/07/24 1136  AST 23  ALT 14  ALKPHOS 83  BILITOT 1.4*  PROT 7.8  ALBUMIN  3.8   CBG: No results for input(s): GLUCAP in the last 168 hours.  Recent Results (from the past 240 hours)  Culture, blood (Routine X 2) w Reflex to ID Panel     Status: None (Preliminary result)   Collection Time: 09/07/24  6:26 PM   Specimen: BLOOD  Result Value Ref Range Status   Specimen Description BLOOD RIGHT ANTECUBITAL  Final   Special Requests   Final    BOTTLES DRAWN AEROBIC AND ANAEROBIC Blood Culture adequate volume   Culture   Final    NO GROWTH < 24 HOURS Performed at [facility], [address], [place], [place] [address]    Report Status PENDING  Incomplete  Culture, blood (Routine X 2) w Reflex to ID Panel     Status: None (Preliminary result)   Collection Time: 09/07/24  6:26 PM   Specimen: BLOOD  Result Value Ref Range Status   Specimen Description BLOOD BLOOD LEFT FOREARM  Final   Special Requests   Final    BOTTLES DRAWN AEROBIC AND ANAEROBIC Blood Culture results may not be optimal due to an inadequate volume of blood received in culture bottles   Culture   Final    NO GROWTH < 24 HOURS Performed at [facility], [address] [place], [place] [address]    Report Status PENDING  Incomplete     Radiology Studies: CT ABDOMEN PELVIS W CONTRAST Result Date: 09/07/2024 CLINICAL DATA:  Acute abdominal pain. EXAM: CT ABDOMEN AND PELVIS WITH CONTRAST TECHNIQUE: Multidetector CT imaging of the abdomen and pelvis was performed using the standard protocol following bolus administration of intravenous contrast. RADIATION DOSE REDUCTION: This exam was performed according to the departmental dose-optimization program which includes automated exposure control, adjustment of the mA and/or kV according to patient size and/or use of iterative reconstruction technique. CONTRAST:   80mL OMNIPAQUE  IOHEXOL  300 MG/ML  SOLN COMPARISON:  03/11/2017 FINDINGS: Lower chest: Subsegmental atelectasis in the dependent right and left lower lobe. No pleural fluid. Hepatobiliary: Few scattered tiny hypodensities in liver are too small to characterize, but of doubtful clinical significance. These are unchanged from prior exam. Gallbladder physiologically distended, no calcified stone. No biliary dilatation. Pancreas: No ductal dilatation or inflammation. Spleen: Normal in size without focal abnormality. Adrenals/Urinary Tract: Bilateral adrenal thickening without nodule. Lobulated renal contours with areas of cortical scarring, more so in the right kidney. Heterogeneous bilateral renal enhancement. Bilateral perinephric edema is more prominent on the left. Question of ureteral thickening bilaterally. No hydronephrosis. No renal calculi. Unremarkable urinary bladder. Stomach/Bowel: No bowel obstruction or inflammation. The appendix is normal. Tiny hiatal hernia. Small volume of formed stool in the colon. Sigmoid diverticulosis without diverticulitis.  Vascular/Lymphatic: Aortic atherosclerosis. No aortic aneurysm. Circumaortic left renal vein. Patent portal vein. Enlarged right inguinal lymph node measures 15 mm short axis. There are small adjacent prominent right inguinal nodes. No other enlarged lymph nodes in the abdomen or pelvis. Reproductive: 3.6 cm fundal fibroid in the uterus.  No adnexal mass. Other: No free air or ascites.  Tiny fat containing umbilical hernia Musculoskeletal: Right hip arthroplasty. Degenerative change throughout the spine. IMPRESSION: 1. Heterogeneous bilateral renal enhancement with perinephric edema, more prominent on the left. Question of ureteral thickening bilaterally. Findings are suspicious for urinary tract infection. Recommend correlation with urinalysis. 2. Enlarged right inguinal lymph node measuring 15 mm short axis. This is nonspecific and may be reactive. Recommend  correlation for any infectious or inflammatory process in the right lower extremity. Consider pelvic CT follow-up in 3 months. 3. Sigmoid diverticulosis without diverticulitis. 4. Uterine fibroid. Aortic Atherosclerosis (ICD10-I70.0). Electronically Signed   By: Andrea Gasman M.D.   On: 09/07/2024 17:29    Scheduled Meds:  enoxaparin  (LOVENOX ) injection  40 mg Subcutaneous QHS   pantoprazole   40 mg Oral Daily   Continuous Infusions:  cefTRIAXone  (ROCEPHIN )  IV     lactated ringers  125 mL/hr at 09/08/24 0425     LOS: 1 day  MDM: Patient is high risk for one or more organ failure.  They necessitate ongoing hospitalization for continued IV therapies and subsequent lab monitoring. Total time spent interpreting labs and vitals, reviewing the medical record, coordinating care amongst consultants and care team members, directly assessing and discussing care with the patient and/or family: 55 min Laree Lock, MD [facility]  To contact the attending physician between 7A-7P please use Epic Chat. To contact the covering physician during after hours 7P-7A, please review Amion.  09/08/2024, 9:08 AM   *This document has been created with the assistance of dictation software. Please excuse typographical errors. *   "

## 2024-09-08 NOTE — TOC CM/SW Note (Signed)
 Transition of Care [facility]) CM/SW Note    Transition of Care [facility]) - Inpatient Brief Assessment   Patient Details  Name: Stacy [name] MRN: [id number] Date of Birth: Feb 07, 1961  [facility]) CM/SW Contact:    Alfonso Rummer, LCSW Phone Number: 09/08/2024, 12:21 PM   Clinical Narrative:  Completed toc chart review no toc needs identified please contact should needs arise  Transition of Care Asessment: Insurance and Status: Insurance coverage has been reviewed Patient has primary care physician: Yes GEORGINA, REGINA W) Home environment has been reviewed: single family home   Prior/Current Home Services: No current home services Social Drivers of Health Review: SDOH reviewed no interventions necessary Readmission risk has been reviewed: No Transition of care needs: no transition of care needs at this time

## 2024-09-08 NOTE — Plan of Care (Signed)
 A&O x4. Abd pain 7-8/10. PRN oxy given x2. LR @ 125.  Problem: Education: Goal: Knowledge of General Education information will improve Description: Including pain rating scale, medication(s)/side effects and non-pharmacologic comfort measures Outcome: Progressing   Problem: Health Behavior/Discharge Planning: Goal: Ability to manage health-related needs will improve Outcome: Progressing   Problem: Clinical Measurements: Goal: Ability to maintain clinical measurements within normal limits will improve Outcome: Progressing Goal: Will remain free from infection Outcome: Progressing Goal: Diagnostic test results will improve Outcome: Progressing Goal: Respiratory complications will improve Outcome: Progressing Goal: Cardiovascular complication will be avoided Outcome: Progressing   Problem: Activity: Goal: Risk for activity intolerance will decrease Outcome: Progressing   Problem: Nutrition: Goal: Adequate nutrition will be maintained Outcome: Progressing   Problem: Coping: Goal: Level of anxiety will decrease Outcome: Progressing   Problem: Elimination: Goal: Will not experience complications related to bowel motility Outcome: Progressing Goal: Will not experience complications related to urinary retention Outcome: Progressing   Problem: Pain Managment: Goal: General experience of comfort will improve and/or be controlled Outcome: Progressing   Problem: Safety: Goal: Ability to remain free from injury will improve Outcome: Progressing   Problem: Skin Integrity: Goal: Risk for impaired skin integrity will decrease Outcome: Progressing

## 2024-09-08 NOTE — Progress Notes (Signed)
 OT Cancellation Note  Patient Details Name: Stacy [name] MRN: [id number] DOB: 04/24/1961   Cancelled Treatment:    Reason Eval/Treat Not Completed: Patient declined, no reason specified. PT declined therapy evaluations this afternoon and requests to rest. OT to re-attempt when next available.   Izetta Claude, MS, OTR/L , CBIS ascom [phone]  09/08/2024, 2:40 PM

## 2024-09-09 ENCOUNTER — Other Ambulatory Visit: Payer: Self-pay

## 2024-09-09 ENCOUNTER — Encounter: Payer: Self-pay | Admitting: Hematology and Oncology

## 2024-09-09 DIAGNOSIS — N12 Tubulo-interstitial nephritis, not specified as acute or chronic: Secondary | ICD-10-CM | POA: Diagnosis not present

## 2024-09-09 LAB — URINE CULTURE: Culture: 80000 — AB

## 2024-09-09 LAB — BASIC METABOLIC PANEL WITH GFR
Anion gap: 13 (ref 5–15)
BUN: 21 mg/dL — ABNORMAL HIGH (ref 6–20)
CO2: 20 mmol/L — ABNORMAL LOW (ref 22–32)
Calcium: 8.1 mg/dL — ABNORMAL LOW (ref 8.9–10.3)
Chloride: 94 mmol/L — ABNORMAL LOW (ref 98–111)
Creatinine, Ser: 1.6 mg/dL — ABNORMAL HIGH (ref 0.44–1.00)
GFR, Estimated: 37 mL/min — ABNORMAL LOW
Glucose, Bld: 108 mg/dL — ABNORMAL HIGH (ref 70–99)
Potassium: 3.5 mmol/L (ref 3.5–5.1)
Sodium: 128 mmol/L — ABNORMAL LOW (ref 135–145)

## 2024-09-09 LAB — CBC
HCT: 29.4 % — ABNORMAL LOW (ref 36.0–46.0)
Hemoglobin: 10.3 g/dL — ABNORMAL LOW (ref 12.0–15.0)
MCH: 29.7 pg (ref 26.0–34.0)
MCHC: 35 g/dL (ref 30.0–36.0)
MCV: 84.7 fL (ref 80.0–100.0)
Platelets: 193 K/uL (ref 150–400)
RBC: 3.47 MIL/uL — ABNORMAL LOW (ref 3.87–5.11)
RDW: 13 % (ref 11.5–15.5)
WBC: 13.3 K/uL — ABNORMAL HIGH (ref 4.0–10.5)
nRBC: 0 % (ref 0.0–0.2)

## 2024-09-09 LAB — MAGNESIUM: Magnesium: 2.3 mg/dL (ref 1.7–2.4)

## 2024-09-09 MED ORDER — CIPROFLOXACIN HCL 500 MG PO TABS: 500.0000 mg | ORAL_TABLET | Freq: Two times a day (BID) | ORAL | Status: DC

## 2024-09-09 MED ORDER — ACETAMINOPHEN 325 MG PO TABS
650.0000 mg | ORAL_TABLET | Freq: Four times a day (QID) | ORAL | 0 refills | Status: AC | PRN
  Filled 2024-09-09: qty 30, 4d supply, fill #0

## 2024-09-09 MED ORDER — CIPROFLOXACIN HCL 500 MG PO TABS
500.0000 mg | ORAL_TABLET | Freq: Two times a day (BID) | ORAL | 0 refills | Status: DC
  Filled 2024-09-09: qty 16, 8d supply, fill #0

## 2024-09-09 MED ORDER — CIPROFLOXACIN HCL 500 MG PO TABS
500.0000 mg | ORAL_TABLET | Freq: Two times a day (BID) | ORAL | Status: DC
  Administered 2024-09-09: 500 mg via ORAL
  Filled 2024-09-09: qty 1

## 2024-09-09 MED ORDER — OXYCODONE HCL 5 MG PO TABS
2.5000 mg | ORAL_TABLET | ORAL | 0 refills | Status: AC | PRN
  Filled 2024-09-09: qty 5, 1d supply, fill #0

## 2024-09-09 MED ORDER — POTASSIUM CHLORIDE CRYS ER 20 MEQ PO TBCR
40.0000 meq | EXTENDED_RELEASE_TABLET | Freq: Once | ORAL | Status: AC
  Administered 2024-09-09: 40 meq via ORAL
  Filled 2024-09-09: qty 2

## 2024-09-09 MED ORDER — PROCHLORPERAZINE MALEATE 10 MG PO TABS
5.0000 mg | ORAL_TABLET | Freq: Four times a day (QID) | ORAL | 0 refills | Status: AC | PRN
  Filled 2024-09-09: qty 10, 5d supply, fill #0

## 2024-09-09 NOTE — Progress Notes (Signed)
 OT Cancellation Note  Patient Details Name: Stacy [name] MRN: [id number] DOB: 05/26/1961   Cancelled Treatment:    Reason Eval/Treat Not Completed: OT screened, no needs identified, will sign off. Orders received, chart reviewed. Pt received in recliner, pt states she is independent in ADL / mobility performance. Has been ambulating to/from bathroom without issue, does not need any DME and politely declines OT evaluation at this time. OT to sign off, please re-consult if new needs arise.  Gaige Sebo L. Johsua Shevlin, OTR/L  09/09/2024, 9:02 AM

## 2024-09-09 NOTE — Evaluation (Addendum)
 Physical Therapy Evaluation & Discharge Patient Details Name: Malaak Stach MRN: [id number] DOB: [birth year] Today's Date: 09/09/2024  History of Present Illness  Pt is a 63 y/o F admitted on 09/07/24 after presenting with c/o N&V, abdominal pain x 4 days. Pt is being treated for acute pyelonephritis, UTI. PMH: GERD, HTN, fibromyalgia, anxiety, breast CA, depression, heart murmur, macular degeneration, mitral valve disorder  Clinical Impression  Pt seen for PT evaluation with pt agreeable. Pt reports prior to admission she was independent, driving, still working (from home), raising young kids. On this date, pt is able to ambulate around unit without AD with mod I reporting she's close to her baseline; limited by chronic back pain. At this time, pt appears to be at or close to baseline in regards to functional mobility & does not require acute PT services. Encouraged pt to continue mobilizing while in hospital. PT to complete current orders; please re-consult if new needs arise.        If plan is discharge home, recommend the following: Assist for transportation;Assistance with cooking/housework   Can travel by private vehicle        Equipment Recommendations None recommended by PT  Recommendations for Other Services       Functional Status Assessment Patient has had a recent decline in their functional status and demonstrates the ability to make significant improvements in function in a reasonable and predictable amount of time.     Precautions / Restrictions Precautions Precautions: None Restrictions Weight Bearing Restrictions Per Provider Order: No      Mobility  Bed Mobility               General bed mobility comments: not tested, pt received & left in recliner    Transfers Overall transfer level: Independent                      Ambulation/Gait Ambulation/Gait assistance: Modified independent (Device/Increase time) Gait Distance (Feet): 175  Feet Assistive device: None Gait Pattern/deviations: Decreased step length - right, Decreased step length - left, Decreased stride length, Decreased dorsiflexion - right, Decreased dorsiflexion - left, Wide base of support Gait velocity: decreased     General Gait Details: no overt LOB  Stairs Stairs:  (pt politely declined practicing)          Wheelchair Mobility     Tilt Bed    Modified Rankin (Stroke Patients Only)       Balance Overall balance assessment: Mild deficits observed, not formally tested                                           Pertinent Vitals/Pain Pain Assessment Pain Assessment: Faces Faces Pain Scale: Hurts whole lot Pain Location: chronic back pain Pain Descriptors / Indicators: Discomfort, Grimacing Pain Intervention(s): Monitored during session, Limited activity within patient's tolerance    Home Living Family/patient expects to be discharged to:: Private residence Living Arrangements: Children (84 y/o & 25 y/o) Available Help at Discharge: Family;Available PRN/intermittently Type of Home: Mobile home Home Access: Stairs to enter Entrance Stairs-Rails: Can reach both;Left;Right Entrance Stairs-Number of Steps: 3   Home Layout: One level Home Equipment: Agricultural [employer]ant (2 wheels);Cane - single point      Prior Function Prior Level of Function : Independent/Modified Independent;Working/employed;Driving             Mobility Comments: Works for  [employer] from home, denies falls, still driving       Extremity/Trunk Assessment   Upper Extremity Assessment Upper Extremity Assessment: Overall WFL for tasks assessed    Lower Extremity Assessment Lower Extremity Assessment: Overall WFL for tasks assessed;Generalized weakness       Communication   Communication Communication: No apparent difficulties    Cognition Arousal: Alert Behavior During Therapy: WFL for tasks assessed/performed   PT - Cognitive  impairments: No apparent impairments                         Following commands: Intact       Cueing Cueing Techniques: Verbal cues     General Comments      Exercises     Assessment/Plan    PT Assessment Patient does not need any further PT services  PT Problem List         PT Treatment Interventions      PT Goals (Current goals can be found in the Care Plan section)  Acute Rehab PT Goals Patient Stated Goal: decreased pain, feel better PT Goal Formulation: All assessment and education complete, DC therapy Time For Goal Achievement: 09/23/24 Potential to Achieve Goals: Good    Frequency       Co-evaluation               AM-PAC PT 6 Clicks Mobility  Outcome Measure Help needed turning from your back to your side while in a flat bed without using bedrails?: None Help needed moving from lying on your back to sitting on the side of a flat bed without using bedrails?: None Help needed moving to and from a bed to a chair (including a wheelchair)?: None Help needed standing up from a chair using your arms (e.g., wheelchair or bedside chair)?: None Help needed to walk in hospital room?: None Help needed climbing 3-5 steps with a railing? : None 6 Click Score: 24    End of Session   Activity Tolerance: Patient limited by pain Patient left: in chair;with call bell/phone within reach        Time: 0916-0924 PT Time Calculation (min) (ACUTE ONLY): 8 min   Charges:   PT Evaluation $PT Eval Low Complexity: 1 Low   PT General Charges $$ ACUTE PT VISIT: 1 Visit         Richerd Pinal, PT, DPT 09/09/2024, 9:30 AM   Richerd CHRISTELLA Pinal 09/09/2024, 9:28 AM

## 2024-09-09 NOTE — Progress Notes (Signed)
 " [facility]  ROUNDING NOTE   Subjective:   Patient sitting up in bed Alert and oriented Continues to have mild pain, not as severe as yesterday Tolerating light meals, denies nausea  Creatinine 1.6  Objective:  Vital signs in last 24 hours:  Temp:  [97.9 F (36.6 C)-99.2 F (37.3 C)] 98.8 F (37.1 C) (12/24 0722) Pulse Rate:  [51-79] 79 (12/24 0722) Resp:  [17-19] 19 (12/24 0722) BP: (98-120)/(44-89) 110/74 (12/24 0722) SpO2:  [93 %] 93 % (12/24 0722)  Weight change:  Filed Weights   09/07/24 1132  Weight: 97.5 kg    Intake/Output: I/O last 3 completed shifts: In: 2767.8 [P.O.:720; I.V.:1945.8; IV Piggyback:102] Out: -    Intake/Output this shift:  Total I/O In: 200 [P.O.:200] Out: -   Physical Exam: General: NAD  Head: Normocephalic, atraumatic. Moist oral mucosal membranes  Eyes: Anicteric  Lungs:  Clear to auscultation  Heart: Regular rate and rhythm  Abdomen:  Soft, nontender  Extremities:  No peripheral edema.  Neurologic: Awake, alert, conversant  Skin: Warm,dry, no rash       Basic Metabolic Panel: Recent Labs  Lab 09/07/24 1136 09/08/24 0442 09/08/24 1454 09/09/24 0447  NA 129* 127* 126* 128*  K 3.7 3.4* 4.3 3.5  CL 90* 93* 92* 94*  CO2 21* 20* 23 20*  GLUCOSE 140* 108* 127* 108*  BUN 29* 29* 25* 21*  CREATININE 1.62* 1.77* 1.77* 1.60*  CALCIUM 9.1 8.1* 8.2* 8.1*  MG  --  2.3  --  2.3    Liver Function Tests: Recent Labs  Lab 09/07/24 1136  AST 23  ALT 14  ALKPHOS 83  BILITOT 1.4*  PROT 7.8  ALBUMIN  3.8   Recent Labs  Lab 09/07/24 1136  LIPASE 17   No results for input(s): AMMONIA in the last 168 hours.  CBC: Recent Labs  Lab 09/07/24 1136 09/08/24 0442 09/09/24 0447  WBC 22.6* 18.6* 13.3*  HGB 14.7 12.1 10.3*  HCT 42.6 34.6* 29.4*  MCV 85.7 85.4 84.7  PLT 229 201 193    Cardiac Enzymes: No results for input(s): CKTOTAL, CKMB, CKMBINDEX, TROPONINI in the last 168  hours.  BNP: Invalid input(s): POCBNP  CBG: No results for input(s): GLUCAP in the last 168 hours.  Microbiology: Results for orders placed or performed during the hospital encounter of 09/07/24  Urine Culture     Status: Abnormal   Collection Time: 09/07/24  2:49 PM   Specimen: Urine, Clean Catch  Result Value Ref Range Status   Specimen Description URINE, CLEAN CATCH  Final   Special Requests NONE  Final   Culture 80,000 COLONIES/mL ESCHERICHIA COLI (A)  Final   Report Status 09/09/2024 FINAL  Final   Organism ID, Bacteria ESCHERICHIA COLI (A)  Final      Susceptibility   Escherichia coli - MIC*    AMPICILLIN >=32 RESISTANT Resistant     CEFAZOLIN  (URINE) Value in next row Sensitive      4 SENSITIVEThis is a modified FDA-approved test that has been validated and its performance characteristics determined by the reporting laboratory.  This laboratory is certified under the Clinical Laboratory Improvement Amendments CLIA as qualified to perform high complexity clinical laboratory testing.    CEFEPIME Value in next row Sensitive      4 SENSITIVEThis is a modified FDA-approved test that has been validated and its performance characteristics determined by the reporting laboratory.  This laboratory is certified under the Clinical Laboratory Improvement Amendments CLIA as qualified  to perform high complexity clinical laboratory testing.    ERTAPENEM Value in next row Sensitive      4 SENSITIVEThis is a modified FDA-approved test that has been validated and its performance characteristics determined by the reporting laboratory.  This laboratory is certified under the Clinical Laboratory Improvement Amendments CLIA as qualified to perform high complexity clinical laboratory testing.    CEFTRIAXONE  Value in next row Sensitive      4 SENSITIVEThis is a modified FDA-approved test that has been validated and its performance characteristics determined by the reporting laboratory.  This  laboratory is certified under the Clinical Laboratory Improvement Amendments CLIA as qualified to perform high complexity clinical laboratory testing.    CIPROFLOXACIN  Value in next row Sensitive      4 SENSITIVEThis is a modified FDA-approved test that has been validated and its performance characteristics determined by the reporting laboratory.  This laboratory is certified under the Clinical Laboratory Improvement Amendments CLIA as qualified to perform high complexity clinical laboratory testing.    GENTAMICIN Value in next row Sensitive      4 SENSITIVEThis is a modified FDA-approved test that has been validated and its performance characteristics determined by the reporting laboratory.  This laboratory is certified under the Clinical Laboratory Improvement Amendments CLIA as qualified to perform high complexity clinical laboratory testing.    NITROFURANTOIN Value in next row Sensitive      4 SENSITIVEThis is a modified FDA-approved test that has been validated and its performance characteristics determined by the reporting laboratory.  This laboratory is certified under the Clinical Laboratory Improvement Amendments CLIA as qualified to perform high complexity clinical laboratory testing.    TRIMETH/SULFA Value in next row Sensitive      4 SENSITIVEThis is a modified FDA-approved test that has been validated and its performance characteristics determined by the reporting laboratory.  This laboratory is certified under the Clinical Laboratory Improvement Amendments CLIA as qualified to perform high complexity clinical laboratory testing.    AMPICILLIN/SULBACTAM Value in next row Intermediate      4 SENSITIVEThis is a modified FDA-approved test that has been validated and its performance characteristics determined by the reporting laboratory.  This laboratory is certified under the Clinical Laboratory Improvement Amendments CLIA as qualified to perform high complexity clinical laboratory testing.     PIP/TAZO Value in next row Sensitive      <=4 SENSITIVEThis is a modified FDA-approved test that has been validated and its performance characteristics determined by the reporting laboratory.  This laboratory is certified under the Clinical Laboratory Improvement Amendments CLIA as qualified to perform high complexity clinical laboratory testing.    MEROPENEM Value in next row Sensitive      <=4 SENSITIVEThis is a modified FDA-approved test that has been validated and its performance characteristics determined by the reporting laboratory.  This laboratory is certified under the Clinical Laboratory Improvement Amendments CLIA as qualified to perform high complexity clinical laboratory testing.    * 80,000 COLONIES/mL ESCHERICHIA COLI  Culture, blood (Routine X 2) w Reflex to ID Panel     Status: None (Preliminary result)   Collection Time: 09/07/24  6:26 PM   Specimen: BLOOD  Result Value Ref Range Status   Specimen Description BLOOD RIGHT ANTECUBITAL  Final   Special Requests   Final    BOTTLES DRAWN AEROBIC AND ANAEROBIC Blood Culture adequate volume   Culture   Final    NO GROWTH 2 DAYS Performed at [facility], [address] [place],  [place] [address]    Report Status PENDING  Incomplete  Culture, blood (Routine X 2) w Reflex to ID Panel     Status: None (Preliminary result)   Collection Time: 09/07/24  6:26 PM   Specimen: BLOOD  Result Value Ref Range Status   Specimen Description BLOOD BLOOD LEFT FOREARM  Final   Special Requests   Final    BOTTLES DRAWN AEROBIC AND ANAEROBIC Blood Culture results may not be optimal due to an inadequate volume of blood received in culture bottles   Culture   Final    NO GROWTH 2 DAYS Performed at [facility], [address], [place], [place] [address]    Report Status PENDING  Incomplete    Coagulation Studies: No results for input(s): LABPROT, INR in the last 72 hours.  Urinalysis: Recent Labs    09/07/24 1449   COLORURINE YELLOW*  LABSPEC 1.006  PHURINE 5.0  GLUCOSEU NEGATIVE  HGBUR MODERATE*  BILIRUBINUR NEGATIVE  KETONESUR NEGATIVE  PROTEINUR 100*  NITRITE NEGATIVE  LEUKOCYTESUR LARGE*      Imaging: CT ABDOMEN PELVIS W CONTRAST Result Date: 09/07/2024 CLINICAL DATA:  Acute abdominal pain. EXAM: CT ABDOMEN AND PELVIS WITH CONTRAST TECHNIQUE: Multidetector CT imaging of the abdomen and pelvis was performed using the standard protocol following bolus administration of intravenous contrast. RADIATION DOSE REDUCTION: This exam was performed according to the departmental dose-optimization program which includes automated exposure control, adjustment of the mA and/or kV according to patient size and/or use of iterative reconstruction technique. CONTRAST:  80mL OMNIPAQUE  IOHEXOL  300 MG/ML  SOLN COMPARISON:  03/11/2017 FINDINGS: Lower chest: Subsegmental atelectasis in the dependent right and left lower lobe. No pleural fluid. Hepatobiliary: Few scattered tiny hypodensities in liver are too small to characterize, but of doubtful clinical significance. These are unchanged from prior exam. Gallbladder physiologically distended, no calcified stone. No biliary dilatation. Pancreas: No ductal dilatation or inflammation. Spleen: Normal in size without focal abnormality. Adrenals/Urinary Tract: Bilateral adrenal thickening without nodule. Lobulated renal contours with areas of cortical scarring, more so in the right kidney. Heterogeneous bilateral renal enhancement. Bilateral perinephric edema is more prominent on the left. Question of ureteral thickening bilaterally. No hydronephrosis. No renal calculi. Unremarkable urinary bladder. Stomach/Bowel: No bowel obstruction or inflammation. The appendix is normal. Tiny hiatal hernia. Small volume of formed stool in the colon. Sigmoid diverticulosis without diverticulitis. Vascular/Lymphatic: Aortic atherosclerosis. No aortic aneurysm. Circumaortic left renal vein. Patent  portal vein. Enlarged right inguinal lymph node measures 15 mm short axis. There are small adjacent prominent right inguinal nodes. No other enlarged lymph nodes in the abdomen or pelvis. Reproductive: 3.6 cm fundal fibroid in the uterus.  No adnexal mass. Other: No free air or ascites.  Tiny fat containing umbilical hernia Musculoskeletal: Right hip arthroplasty. Degenerative change throughout the spine. IMPRESSION: 1. Heterogeneous bilateral renal enhancement with perinephric edema, more prominent on the left. Question of ureteral thickening bilaterally. Findings are suspicious for urinary tract infection. Recommend correlation with urinalysis. 2. Enlarged right inguinal lymph node measuring 15 mm short axis. This is nonspecific and may be reactive. Recommend correlation for any infectious or inflammatory process in the right lower extremity. Consider pelvic CT follow-up in 3 months. 3. Sigmoid diverticulosis without diverticulitis. 4. Uterine fibroid. Aortic Atherosclerosis (ICD10-I70.0). Electronically Signed   By: Andrea Gasman M.D.   On: 09/07/2024 17:29     Medications:    lactated ringers  125 mL/hr at 09/09/24 1049    ciprofloxacin   500 mg Oral BID  enoxaparin  (LOVENOX ) injection  40 mg Subcutaneous QHS   pantoprazole   40 mg Oral Daily   acetaminophen , HYDROmorphone  (DILAUDID ) injection, ondansetron  (ZOFRAN ) IV, oxyCODONE , oxyCODONE   Assessment/ Plan:  Ms. Jeneal Vogl is a 63 y.o.  female  with past medical conditions including GERD, hypertension, and fibromyalgia, who was admitted to [facility] on 09/07/2024 for Pyelonephritis [N12] AKI (acute kidney injury) [N17.9]   Acute kidney injury likely secondary to dehydration and infectious process. Normal renal function noted in April of this year. UA positive for UTI, CT abdomen pelvis confirming pyelonephritis as well.  Creatinine appears stable with IVF and improved oral intake. Will defer discharge plan to primary team.    Lab  Results  Component Value Date   CREATININE 1.60 (H) 09/09/2024   CREATININE 1.77 (H) 09/08/2024   CREATININE 1.77 (H) 09/08/2024    Intake/Output Summary (Last 24 hours) at 09/09/2024 1251 Last data filed at 09/09/2024 0900 Gross per 24 hour  Intake 2047.62 ml  Output --  Net 2047.62 ml    2.  Acute pyelonephritis, CT abdomen pelvis showed bilateral renal enhancement with perinephric edema, left greater than right.  Primary team has ordered IV ceftriaxone .  Urine culture positive for E-coli. Primary team to transition to oral Cipro .   3.  Hypotension with history of hypertension.  Olmesartan  and hydrochlorothiazide  currently held.  Patient received 1 dose of midodrine  5 mg yesterday.     LOS: 2 Temisha Murley 12/24/202512:51 PM   "

## 2024-09-09 NOTE — Discharge Summary (Signed)
 " Physician Discharge Summary   Patient: Stacy [name] MRN: [id number] DOB: 1961-08-22  Admit date:     09/07/2024  Discharge date: 09/09/2024  Discharge Physician: Laree Lock   PCP: Antonette Angeline ORN, NP   Recommendations at discharge:   Follow up with PCP in 3-5 days - Repeat BMP, CBC Follow up on Incidental findings   Resume hydrochlorothiazide /Olmesartan  as BP tolerates and AKI resolves  Discharge Diagnoses: Active Problems:   Pyelonephritis  Hospital Course: Stacy [name] is a 63 y.o. female with medical history significant of PMH GERD, HTN, Fibromyalgia presented with nausea/vomiting, abdominal pain, ongoing for 4 days, unable to tolerate p.o. intake. Reports had 2-3 episodes of diarrhea which resolved. Has generalized body aches, subjective fever, chills.  Patient was admitted for UTI/acute pyelonephritis, AKI, hyponatremia.  Hospital course as below  Patient symptoms are improved with IV antibiotics, AKI with mild improvement, patient insisting she would like to go home and follow-up outpatient  Acute pyelonephritis UTI leukocytosis improving CT abdomen/pelvis shows heterogeneous bilateral renal enhancement with perinephric edema, more prominent on the left. Urine culture grew E. coli, reviewed sensitivity Was on IV Zosyn , changed to p.o. ciprofloxacin  to complete 10-days Pain management with Tylenol , oxycodone  as needed, Antiemetics prn   AKI AGMA resolved Likely due to dehydration due to vomiting/diarrhea which resolved, now able to tolerate p.o. intake BUN/creatinine 29/1.62 -> 1.77 -> 1.60, Baseline Cr ~ 0.9-1 S/p IV fluids, now tolerating p.o. intake Discussed with nephrology, okay to discharge home and follow-up outpatient for repeat BMP within 5 days Avoid NSAIDs   Hyponatremia Hypochloremia Likely due to dehydration S/p IV fluids, sodium improved to 128 Follow-up outpatient, discussed with nephrology   Hypokalemia Repleted  Acute drop in  hemoglobin Normocytic anemia S/p IV fluids, no evidence of bleeding.  Follow-up outpatient   HTN Hold hydrochlorothiazide /ARB due to AKI Follow-up with PCP   Fibromyalgia Takes Ibuprofen  at home, hold due to AKI Tylenol  and Oxycodone  prn   GERD PPI   Incidental Findings: Enlarged right inguinal lymph nodes measuring 15 mm, nonspecific and may be reactive.  Consider pelvic CT follow-up in 3 months.   Sigmoid diverticulosis without diverticulitis Uterine fibroids   Obesity class II BMI 38.09 Outpatient follow up for lifestyle modification and risk factor management   Pain control - [place]  Controlled Substance Reporting System database was reviewed. and patient was instructed, not to drive, operate heavy machinery, perform activities at heights, swimming or participation in water activities or provide baby-sitting services while on Pain, Sleep and Anxiety Medications; until their outpatient Physician has advised to do so again. Also recommended to not to take more than prescribed Pain, Sleep and Anxiety Medications.  Consultants: Nephrology Procedures performed: None  Disposition: Home Diet recommendation:  Discharge Diet Orders (From admission, onward)     Start     Ordered   09/09/24 0000  Diet general        09/09/24 1336           DISCHARGE MEDICATION: Allergies as of 09/09/2024       Reactions   Gabapentin Swelling   makes me feel detached   Naproxen Palpitations   Neulasta  [pegfilgrastim ] Itching, Other (See Comments)   Patient stated,reddish skin tone, body on fire, tingly tongue.   Erythromycin Nausea And Vomiting   Gabapentin Other (See Comments)   Makes me feel out of my body   Neulasta  [pegfilgrastim ] Itching   Tramadol Other (See Comments)   headache   Erythromycin Nausea And Vomiting  Naproxen Palpitations   Tramadol Hcl Other (See Comments)   Headaches        Medication List     PAUSE taking these medications     hydrochlorothiazide  25 MG tablet Wait to take this until your doctor or other care provider tells you to start again. Commonly known as: HYDRODIURIL  TAKE 1 TABLET (25 MG TOTAL) BY MOUTH DAILY.   olmesartan  20 MG tablet Wait to take this until your doctor or other care provider tells you to start again. Commonly known as: BENICAR  Take 0.5 tablets (10 mg total) by mouth daily.   Wegovy 1 MG/0.5ML Soaj SQ injection Wait to take this until your doctor or other care provider tells you to start again. Generic drug: semaglutide-weight management       STOP taking these medications    celecoxib  100 MG capsule Commonly known as: CeleBREX    ibuprofen  200 MG tablet Commonly known as: ADVIL        TAKE these medications    acetaminophen  325 MG tablet Commonly known as: TYLENOL  Take 2 tablets (650 mg total) by mouth every 6 (six) hours as needed for mild pain (pain score 1-3), headache or fever.   ciprofloxacin  500 MG tablet Commonly known as: CIPRO  Take 1 tablet (500 mg total) by mouth 2 (two) times daily for 8 days.   oxyCODONE  5 MG immediate release tablet Commonly known as: Oxy IR/ROXICODONE  Take 0.5-1 tablets (2.5-5 mg total) by mouth every 4 (four) hours as needed for moderate pain (pain score 4-6) or severe pain (pain score 7-10).   prochlorperazine  10 MG tablet Commonly known as: COMPAZINE  Take 0.5 tablets (5 mg total) by mouth every 6 (six) hours as needed for nausea or vomiting.   promethazine -dextromethorphan 6.25-15 MG/5ML syrup Commonly known as: PROMETHAZINE -DM Take 5 mLs by mouth 4 (four) times daily as needed.        Discharge Exam: Filed Weights   09/07/24 1132  Weight: 97.5 kg   General: Awake, alert Cardio: RRR, no murmurs heard Lungs: Clear to auscultation bilaterally, no wheezing, no use of accessory muscle Abdomen: Soft, mild epigastric tenderness, no guarding/rigidity, mild CVA tenderness L > R + Neuro: AO x 3, no gross focal  deficit  Condition at discharge: fair  The results of significant diagnostics from this hospitalization (including imaging, microbiology, ancillary and laboratory) are listed below for reference.   Imaging Studies: CT ABDOMEN PELVIS W CONTRAST Result Date: 09/07/2024 CLINICAL DATA:  Acute abdominal pain. EXAM: CT ABDOMEN AND PELVIS WITH CONTRAST TECHNIQUE: Multidetector CT imaging of the abdomen and pelvis was performed using the standard protocol following bolus administration of intravenous contrast. RADIATION DOSE REDUCTION: This exam was performed according to the departmental dose-optimization program which includes automated exposure control, adjustment of the mA and/or kV according to patient size and/or use of iterative reconstruction technique. CONTRAST:  80mL OMNIPAQUE  IOHEXOL  300 MG/ML  SOLN COMPARISON:  03/11/2017 FINDINGS: Lower chest: Subsegmental atelectasis in the dependent right and left lower lobe. No pleural fluid. Hepatobiliary: Few scattered tiny hypodensities in liver are too small to characterize, but of doubtful clinical significance. These are unchanged from prior exam. Gallbladder physiologically distended, no calcified stone. No biliary dilatation. Pancreas: No ductal dilatation or inflammation. Spleen: Normal in size without focal abnormality. Adrenals/Urinary Tract: Bilateral adrenal thickening without nodule. Lobulated renal contours with areas of cortical scarring, more so in the right kidney. Heterogeneous bilateral renal enhancement. Bilateral perinephric edema is more prominent on the left. Question of ureteral thickening bilaterally. No hydronephrosis. No  renal calculi. Unremarkable urinary bladder. Stomach/Bowel: No bowel obstruction or inflammation. The appendix is normal. Tiny hiatal hernia. Small volume of formed stool in the colon. Sigmoid diverticulosis without diverticulitis. Vascular/Lymphatic: Aortic atherosclerosis. No aortic aneurysm. Circumaortic left renal  vein. Patent portal vein. Enlarged right inguinal lymph node measures 15 mm short axis. There are small adjacent prominent right inguinal nodes. No other enlarged lymph nodes in the abdomen or pelvis. Reproductive: 3.6 cm fundal fibroid in the uterus.  No adnexal mass. Other: No free air or ascites.  Tiny fat containing umbilical hernia Musculoskeletal: Right hip arthroplasty. Degenerative change throughout the spine. IMPRESSION: 1. Heterogeneous bilateral renal enhancement with perinephric edema, more prominent on the left. Question of ureteral thickening bilaterally. Findings are suspicious for urinary tract infection. Recommend correlation with urinalysis. 2. Enlarged right inguinal lymph node measuring 15 mm short axis. This is nonspecific and may be reactive. Recommend correlation for any infectious or inflammatory process in the right lower extremity. Consider pelvic CT follow-up in 3 months. 3. Sigmoid diverticulosis without diverticulitis. 4. Uterine fibroid. Aortic Atherosclerosis (ICD10-I70.0). Electronically Signed   By: Andrea Gasman M.D.   On: 09/07/2024 17:29   MM 3D DIAGNOSTIC MAMMOGRAM BILATERAL BREAST Result Date: 08/24/2024 CLINICAL DATA:  History of LEFT breast cancer with lumpectomy in 2017. EXAM: DIGITAL DIAGNOSTIC BILATERAL MAMMOGRAM WITH TOMOSYNTHESIS AND CAD TECHNIQUE: Bilateral digital diagnostic mammography and breast tomosynthesis was performed. The images were evaluated with computer-aided detection. COMPARISON:  Previous exam(s). ACR Breast Density Category b: There are scattered areas of fibroglandular density. FINDINGS: There is density and architectural distortion within the LEFT breast, consistent with postsurgical changes. These are stable in comparison to prior. No suspicious mass, distortion, or microcalcifications are identified to suggest presence of malignancy. IMPRESSION: No mammographic evidence of malignancy bilaterally. RECOMMENDATION: Per protocol, as the patient is  now 2 or more years status post lumpectomy, she may return to annual screening mammography in 1 year. However, given the history of breast cancer, the patient remains eligible for annual diagnostic mammography if preferred. (Code:[id number]) I have discussed the findings and recommendations with the patient. If applicable, a reminder letter will be sent to the patient regarding the next appointment. BI-RADS CATEGORY  2: Benign. Electronically Signed   By: Corean Salter M.D.   On: 08/24/2024 10:50    Microbiology: Results for orders placed or performed during the hospital encounter of 09/07/24  Urine Culture     Status: Abnormal   Collection Time: 09/07/24  2:49 PM   Specimen: Urine, Clean Catch  Result Value Ref Range Status   Specimen Description URINE, CLEAN CATCH  Final   Special Requests NONE  Final   Culture 80,000 COLONIES/mL ESCHERICHIA COLI (A)  Final   Report Status 09/09/2024 FINAL  Final   Organism ID, Bacteria ESCHERICHIA COLI (A)  Final      Susceptibility   Escherichia coli - MIC*    AMPICILLIN >=32 RESISTANT Resistant     CEFAZOLIN  (URINE) Value in next row Sensitive      4 SENSITIVEThis is a modified FDA-approved test that has been validated and its performance characteristics determined by the reporting laboratory.  This laboratory is certified under the Clinical Laboratory Improvement Amendments CLIA as qualified to perform high complexity clinical laboratory testing.    CEFEPIME Value in next row Sensitive      4 SENSITIVEThis is a modified FDA-approved test that has been validated and its performance characteristics determined by the reporting laboratory.  This laboratory is certified under the Clinical  Laboratory Improvement Amendments CLIA as qualified to perform high complexity clinical laboratory testing.    ERTAPENEM Value in next row Sensitive      4 SENSITIVEThis is a modified FDA-approved test that has been validated and its performance characteristics determined  by the reporting laboratory.  This laboratory is certified under the Clinical Laboratory Improvement Amendments CLIA as qualified to perform high complexity clinical laboratory testing.    CEFTRIAXONE  Value in next row Sensitive      4 SENSITIVEThis is a modified FDA-approved test that has been validated and its performance characteristics determined by the reporting laboratory.  This laboratory is certified under the Clinical Laboratory Improvement Amendments CLIA as qualified to perform high complexity clinical laboratory testing.    CIPROFLOXACIN  Value in next row Sensitive      4 SENSITIVEThis is a modified FDA-approved test that has been validated and its performance characteristics determined by the reporting laboratory.  This laboratory is certified under the Clinical Laboratory Improvement Amendments CLIA as qualified to perform high complexity clinical laboratory testing.    GENTAMICIN Value in next row Sensitive      4 SENSITIVEThis is a modified FDA-approved test that has been validated and its performance characteristics determined by the reporting laboratory.  This laboratory is certified under the Clinical Laboratory Improvement Amendments CLIA as qualified to perform high complexity clinical laboratory testing.    NITROFURANTOIN Value in next row Sensitive      4 SENSITIVEThis is a modified FDA-approved test that has been validated and its performance characteristics determined by the reporting laboratory.  This laboratory is certified under the Clinical Laboratory Improvement Amendments CLIA as qualified to perform high complexity clinical laboratory testing.    TRIMETH/SULFA Value in next row Sensitive      4 SENSITIVEThis is a modified FDA-approved test that has been validated and its performance characteristics determined by the reporting laboratory.  This laboratory is certified under the Clinical Laboratory Improvement Amendments CLIA as qualified to perform high complexity clinical  laboratory testing.    AMPICILLIN/SULBACTAM Value in next row Intermediate      4 SENSITIVEThis is a modified FDA-approved test that has been validated and its performance characteristics determined by the reporting laboratory.  This laboratory is certified under the Clinical Laboratory Improvement Amendments CLIA as qualified to perform high complexity clinical laboratory testing.    PIP/TAZO Value in next row Sensitive      <=4 SENSITIVEThis is a modified FDA-approved test that has been validated and its performance characteristics determined by the reporting laboratory.  This laboratory is certified under the Clinical Laboratory Improvement Amendments CLIA as qualified to perform high complexity clinical laboratory testing.    MEROPENEM Value in next row Sensitive      <=4 SENSITIVEThis is a modified FDA-approved test that has been validated and its performance characteristics determined by the reporting laboratory.  This laboratory is certified under the Clinical Laboratory Improvement Amendments CLIA as qualified to perform high complexity clinical laboratory testing.    * 80,000 COLONIES/mL ESCHERICHIA COLI  Culture, blood (Routine X 2) w Reflex to ID Panel     Status: None (Preliminary result)   Collection Time: 09/07/24  6:26 PM   Specimen: BLOOD  Result Value Ref Range Status   Specimen Description BLOOD RIGHT ANTECUBITAL  Final   Special Requests   Final    BOTTLES DRAWN AEROBIC AND ANAEROBIC Blood Culture adequate volume   Culture   Final    NO GROWTH 2 DAYS Performed at [facility]  Lab, [address] [place], [place] [address]    Report Status PENDING  Incomplete  Culture, blood (Routine X 2) w Reflex to ID Panel     Status: None (Preliminary result)   Collection Time: 09/07/24  6:26 PM   Specimen: BLOOD  Result Value Ref Range Status   Specimen Description BLOOD BLOOD LEFT FOREARM  Final   Special Requests   Final    BOTTLES DRAWN AEROBIC AND ANAEROBIC Blood Culture  results may not be optimal due to an inadequate volume of blood received in culture bottles   Culture   Final    NO GROWTH 2 DAYS Performed at [facility], [address] [place], [place] [address]    Report Status PENDING  Incomplete    Labs: CBC: Recent Labs  Lab 09/07/24 1136 09/08/24 0442 09/09/24 0447  WBC 22.6* 18.6* 13.3*  HGB 14.7 12.1 10.3*  HCT 42.6 34.6* 29.4*  MCV 85.7 85.4 84.7  PLT 229 201 193   Basic Metabolic Panel: Recent Labs  Lab 09/07/24 1136 09/08/24 0442 09/08/24 1454 09/09/24 0447  NA 129* 127* 126* 128*  K 3.7 3.4* 4.3 3.5  CL 90* 93* 92* 94*  CO2 21* 20* 23 20*  GLUCOSE 140* 108* 127* 108*  BUN 29* 29* 25* 21*  CREATININE 1.62* 1.77* 1.77* 1.60*  CALCIUM 9.1 8.1* 8.2* 8.1*  MG  --  2.3  --  2.3   Liver Function Tests: Recent Labs  Lab 09/07/24 1136  AST 23  ALT 14  ALKPHOS 83  BILITOT 1.4*  PROT 7.8  ALBUMIN  3.8   CBG: No results for input(s): GLUCAP in the last 168 hours.  Discharge time spent: greater than 30 minutes.  Signed: Laree Lock, MD [facility] 09/09/2024 "

## 2024-09-09 NOTE — Plan of Care (Signed)

## 2024-09-09 NOTE — Plan of Care (Signed)

## 2024-09-11 ENCOUNTER — Inpatient Hospital Stay (HOSPITAL_COMMUNITY)
Admission: EM | Admit: 2024-09-11 | Discharge: 2024-09-14 | DRG: 690 | Disposition: A | Discharge status: Home / Self Care | Attending: Internal Medicine | Admitting: Internal Medicine

## 2024-09-11 ENCOUNTER — Other Ambulatory Visit: Payer: Self-pay

## 2024-09-11 ENCOUNTER — Ambulatory Visit: Payer: Self-pay | Admitting: Internal Medicine

## 2024-09-11 ENCOUNTER — Encounter (HOSPITAL_COMMUNITY): Payer: Self-pay

## 2024-09-11 ENCOUNTER — Emergency Department (HOSPITAL_COMMUNITY)

## 2024-09-11 DIAGNOSIS — E66812 Obesity, class 2: Secondary | ICD-10-CM | POA: Diagnosis present

## 2024-09-11 DIAGNOSIS — F419 Anxiety disorder, unspecified: Secondary | ICD-10-CM

## 2024-09-11 DIAGNOSIS — F17210 Nicotine dependence, cigarettes, uncomplicated: Secondary | ICD-10-CM | POA: Diagnosis present

## 2024-09-11 DIAGNOSIS — F32A Depression, unspecified: Secondary | ICD-10-CM | POA: Diagnosis present

## 2024-09-11 DIAGNOSIS — N179 Acute kidney failure, unspecified: Secondary | ICD-10-CM | POA: Diagnosis present

## 2024-09-11 DIAGNOSIS — Z853 Personal history of malignant neoplasm of breast: Secondary | ICD-10-CM

## 2024-09-11 DIAGNOSIS — Z79899 Other long term (current) drug therapy: Secondary | ICD-10-CM

## 2024-09-11 DIAGNOSIS — M797 Fibromyalgia: Secondary | ICD-10-CM | POA: Diagnosis present

## 2024-09-11 DIAGNOSIS — M109 Gout, unspecified: Secondary | ICD-10-CM | POA: Diagnosis present

## 2024-09-11 DIAGNOSIS — I10 Essential (primary) hypertension: Secondary | ICD-10-CM | POA: Diagnosis present

## 2024-09-11 DIAGNOSIS — E878 Other disorders of electrolyte and fluid balance, not elsewhere classified: Secondary | ICD-10-CM | POA: Diagnosis present

## 2024-09-11 DIAGNOSIS — R112 Nausea with vomiting, unspecified: Secondary | ICD-10-CM | POA: Diagnosis not present

## 2024-09-11 DIAGNOSIS — N12 Tubulo-interstitial nephritis, not specified as acute or chronic: Secondary | ICD-10-CM

## 2024-09-11 DIAGNOSIS — Z96641 Presence of right artificial hip joint: Secondary | ICD-10-CM | POA: Diagnosis present

## 2024-09-11 DIAGNOSIS — N10 Acute tubulo-interstitial nephritis: Principal | ICD-10-CM | POA: Diagnosis present

## 2024-09-11 DIAGNOSIS — Z923 Personal history of irradiation: Secondary | ICD-10-CM

## 2024-09-11 DIAGNOSIS — H3530 Unspecified macular degeneration: Secondary | ICD-10-CM | POA: Diagnosis present

## 2024-09-11 DIAGNOSIS — Z6838 Body mass index (BMI) 38.0-38.9, adult: Secondary | ICD-10-CM

## 2024-09-11 DIAGNOSIS — M1990 Unspecified osteoarthritis, unspecified site: Secondary | ICD-10-CM | POA: Diagnosis present

## 2024-09-11 DIAGNOSIS — D649 Anemia, unspecified: Secondary | ICD-10-CM | POA: Diagnosis not present

## 2024-09-11 DIAGNOSIS — E871 Hypo-osmolality and hyponatremia: Secondary | ICD-10-CM | POA: Diagnosis present

## 2024-09-11 DIAGNOSIS — F411 Generalized anxiety disorder: Secondary | ICD-10-CM | POA: Diagnosis present

## 2024-09-11 DIAGNOSIS — Z803 Family history of malignant neoplasm of breast: Secondary | ICD-10-CM

## 2024-09-11 DIAGNOSIS — Z9221 Personal history of antineoplastic chemotherapy: Secondary | ICD-10-CM

## 2024-09-11 DIAGNOSIS — E876 Hypokalemia: Secondary | ICD-10-CM | POA: Diagnosis present

## 2024-09-11 DIAGNOSIS — N3000 Acute cystitis without hematuria: Secondary | ICD-10-CM | POA: Diagnosis present

## 2024-09-11 LAB — URINALYSIS, ROUTINE W REFLEX MICROSCOPIC
Bilirubin Urine: NEGATIVE
Glucose, UA: NEGATIVE mg/dL
Ketones, ur: NEGATIVE mg/dL
Nitrite: NEGATIVE
Protein, ur: NEGATIVE mg/dL
Specific Gravity, Urine: 1.002 — ABNORMAL LOW (ref 1.005–1.030)
pH: 6 (ref 5.0–8.0)

## 2024-09-11 LAB — LIPASE, BLOOD: Lipase: 38 U/L (ref 11–51)

## 2024-09-11 LAB — COMPREHENSIVE METABOLIC PANEL WITH GFR
ALT: 22 U/L (ref 0–44)
AST: 28 U/L (ref 15–41)
Albumin: 3.4 g/dL — ABNORMAL LOW (ref 3.5–5.0)
Alkaline Phosphatase: 93 U/L (ref 38–126)
Anion gap: 13 (ref 5–15)
BUN: 13 mg/dL (ref 8–23)
CO2: 22 mmol/L (ref 22–32)
Calcium: 8.9 mg/dL (ref 8.9–10.3)
Chloride: 99 mmol/L (ref 98–111)
Creatinine, Ser: 1.13 mg/dL — ABNORMAL HIGH (ref 0.44–1.00)
GFR, Estimated: 55 mL/min — ABNORMAL LOW
Glucose, Bld: 92 mg/dL (ref 70–99)
Potassium: 3.6 mmol/L (ref 3.5–5.1)
Sodium: 134 mmol/L — ABNORMAL LOW (ref 135–145)
Total Bilirubin: 0.7 mg/dL (ref 0.0–1.2)
Total Protein: 7 g/dL (ref 6.5–8.1)

## 2024-09-11 LAB — CBC
HCT: 31.7 % — ABNORMAL LOW (ref 36.0–46.0)
Hemoglobin: 10.6 g/dL — ABNORMAL LOW (ref 12.0–15.0)
MCH: 29.6 pg (ref 26.0–34.0)
MCHC: 33.4 g/dL (ref 30.0–36.0)
MCV: 88.5 fL (ref 80.0–100.0)
Platelets: 376 K/uL (ref 150–400)
RBC: 3.58 MIL/uL — ABNORMAL LOW (ref 3.87–5.11)
RDW: 13.3 % (ref 11.5–15.5)
WBC: 12.7 K/uL — ABNORMAL HIGH (ref 4.0–10.5)
nRBC: 0 % (ref 0.0–0.2)

## 2024-09-11 MED ORDER — SODIUM CHLORIDE 0.9 % IV SOLN: 250.0000 mL | INTRAVENOUS | Status: AC | PRN

## 2024-09-11 MED ORDER — SODIUM CHLORIDE 0.9 % IV SOLN
1.0000 g | Freq: Once | INTRAVENOUS | Status: AC
  Administered 2024-09-11: 1 g via INTRAVENOUS
  Filled 2024-09-11: qty 10

## 2024-09-11 MED ORDER — ONDANSETRON HCL 4 MG/2ML IJ SOLN
4.0000 mg | Freq: Four times a day (QID) | INTRAMUSCULAR | Status: DC | PRN
  Administered 2024-09-12 (×3): 4 mg via INTRAVENOUS
  Filled 2024-09-11 (×3): qty 2

## 2024-09-11 MED ORDER — PROCHLORPERAZINE EDISYLATE 10 MG/2ML IJ SOLN
10.0000 mg | Freq: Four times a day (QID) | INTRAMUSCULAR | Status: DC | PRN
  Administered 2024-09-13: 10 mg via INTRAVENOUS
  Filled 2024-09-11: qty 2

## 2024-09-11 MED ORDER — SODIUM CHLORIDE 0.9% FLUSH
3.0000 mL | Freq: Two times a day (BID) | INTRAVENOUS | Status: DC
  Administered 2024-09-11 – 2024-09-14 (×5): 3 mL via INTRAVENOUS

## 2024-09-11 MED ORDER — HYDRALAZINE HCL 20 MG/ML IJ SOLN: 10.0000 mg | Freq: Three times a day (TID) | INTRAMUSCULAR | Status: DC | PRN

## 2024-09-11 MED ORDER — LACTATED RINGERS IV BOLUS: 1000.0000 mL | Freq: Once | INTRAVENOUS | Status: DC

## 2024-09-11 MED ORDER — LACTATED RINGERS IV SOLN: INTRAVENOUS | Status: AC

## 2024-09-11 MED ORDER — METOCLOPRAMIDE HCL 5 MG/ML IJ SOLN
10.0000 mg | Freq: Once | INTRAMUSCULAR | Status: AC
  Administered 2024-09-11: 10 mg via INTRAVENOUS
  Filled 2024-09-11: qty 2

## 2024-09-11 MED ORDER — ACETAMINOPHEN 325 MG PO TABS
650.0000 mg | ORAL_TABLET | Freq: Four times a day (QID) | ORAL | Status: DC | PRN
  Administered 2024-09-12: 650 mg via ORAL
  Filled 2024-09-11: qty 2

## 2024-09-11 MED ORDER — SODIUM CHLORIDE 0.9% FLUSH: 3.0000 mL | INTRAVENOUS | Status: DC | PRN

## 2024-09-11 MED ORDER — IOHEXOL 300 MG/ML  SOLN
100.0000 mL | Freq: Once | INTRAMUSCULAR | Status: AC | PRN
  Administered 2024-09-11: 100 mL via INTRAVENOUS

## 2024-09-11 MED ORDER — ONDANSETRON HCL 4 MG PO TABS
4.0000 mg | ORAL_TABLET | Freq: Four times a day (QID) | ORAL | Status: DC | PRN
  Administered 2024-09-13 – 2024-09-14 (×2): 4 mg via ORAL
  Filled 2024-09-11: qty 1

## 2024-09-11 MED ORDER — ACETAMINOPHEN 650 MG RE SUPP: 650.0000 mg | Freq: Four times a day (QID) | RECTAL | Status: DC | PRN

## 2024-09-11 MED ORDER — ENOXAPARIN SODIUM 40 MG/0.4ML IJ SOSY
40.0000 mg | PREFILLED_SYRINGE | INTRAMUSCULAR | Status: DC
  Administered 2024-09-12 – 2024-09-14 (×3): 40 mg via SUBCUTANEOUS
  Filled 2024-09-11 (×2): qty 0.4

## 2024-09-11 MED ORDER — SODIUM CHLORIDE 0.9 % IV BOLUS
1000.0000 mL | Freq: Once | INTRAVENOUS | Status: AC
  Administered 2024-09-11: 1000 mL via INTRAVENOUS

## 2024-09-11 MED ORDER — SODIUM CHLORIDE 0.9 % IV SOLN
2.0000 g | INTRAVENOUS | Status: DC
  Administered 2024-09-12 – 2024-09-14 (×3): 2 g via INTRAVENOUS
  Filled 2024-09-11 (×2): qty 20

## 2024-09-11 NOTE — ED Provider Notes (Signed)
 " [place] [facility] AT [facility] Provider Note  CSN: [id number] Arrival date & time: 09/11/24 1701  Chief Complaint(s) Emesis  HPI Stacy [name] is a 63 y.o. female here today for nausea vomiting, inability to tolerate p.o..  She was recently discharged after being mated for pyelonephritis.  She was switched to ciprofloxacin  at discharge.  Says that since being discharged, she has been having worsening abdominal pain, has been having multiple episodes of vomiting.   Past Medical History Past Medical History:  Diagnosis Date   Anxiety    Arthritis    Breast cancer (HCC)    Cancer (HCC)    Cataract    left eye, per patient   Complication of anesthesia    DIFFICULTY WAKING UP , LAST SURGERY NECK 2012 WAS FINE PER PT HERE AT [facility]   Depression    Fibromyalgia    Genetic testing 12/21/2016   Genetic testing reported out on 12/20/2016 through [facility]'s 46-gene Common Hereditary Cancers panel found no deleterious mutations. The following 46 genes were analyzed: APC, ATM, AXIN2, BARD1, BMPR1A, BRCA1, BRCA2, BRIP1, CDH1, CDKN2A, CHEK2, CTNNA1, DICER1, EPCAM, GREM1, KIT, MEN1, MLH1, MSH2, MSH3, MSH6, MUTYH, NBN, NF1, NTHL1, PALB2, PDGFRA, PMS2, POLD1, POLE, PTEN, RAD50, RAD51C, RAD51D, SDH   Headache    HX MIGRAINES     Heart murmur    History of radiation therapy 02/14/17-04/03/2017   left breast 60.4 Gy: 50.4 Gy delivered in 28 fractions and a boost of 10 Gy delivered in 5 fractions.   Hypertension    Macular degeneration    right eye, per patient   Malignant neoplasm of upper-outer quadrant of left female breast (HCC) 08/23/2016   Mitral valve disorder    MVP (mitral valve prolapse)    AS INFANT   Neck pain    Personal history of chemotherapy    Personal history of radiation therapy    Vision abnormalities    Patient Active Problem List   Diagnosis Date Noted   Gout 09/11/2024   Normocytic anemia 09/11/2024   Intractable nausea and vomiting  09/11/2024   Acute pyelonephritis 09/07/2024   HLD (hyperlipidemia) 04/13/2021   Prediabetes 04/13/2021   Class 3 severe obesity due to excess calories with body mass index (BMI) of 40.0 to 44.9 in adult [facility]) 04/13/2021   Port catheter in place 10/09/2016   GERD (gastroesophageal reflux disease) 10/09/2016   History of left breast cancer 08/23/2016   Anxiety and depression 04/20/2016   Arthritis 06/27/2015   Essential hypertension 04/19/2009   Fibromyalgia 02/18/2008   Home Medication(s) Prior to Admission medications  Medication Sig Start Date End Date Taking? Authorizing Provider  acetaminophen  (TYLENOL ) 325 MG tablet Take 2 tablets (650 mg total) by mouth every 6 (six) hours as needed for mild pain (pain score 1-3), headache or fever. 09/09/24   Jerelene Critchley, MD  ciprofloxacin  (CIPRO ) 500 MG tablet Take 1 tablet (500 mg total) by mouth 2 (two) times daily for 8 days. 09/09/24 09/17/24  Jerelene Critchley, MD  [Paused] hydrochlorothiazide  (HYDRODIURIL ) 25 MG tablet TAKE 1 TABLET (25 MG TOTAL) BY MOUTH DAILY. Wait to take this until your doctor or other care provider tells you to start again. 09/01/24   Antonette Angeline ORN, NP  [Paused] olmesartan  (BENICAR ) 20 MG tablet Take 0.5 tablets (10 mg total) by mouth daily. Patient not taking: Reported on 09/09/2024 Wait to take this until your doctor or other care provider tells you to start again. 01/14/24   Antonette Angeline ORN,  NP  oxyCODONE  (OXY IR/ROXICODONE ) 5 MG immediate release tablet Take 0.5-1 tablets (2.5-5 mg total) by mouth every 4 (four) hours as needed for moderate pain (pain score 4-6) or severe pain (pain score 7-10). 09/09/24   Jerelene Critchley, MD  prochlorperazine  (COMPAZINE ) 10 MG tablet Take 0.5 tablets (5 mg total) by mouth every 6 (six) hours as needed for nausea or vomiting. 09/09/24   Jerelene Critchley, MD  promethazine -dextromethorphan (PROMETHAZINE -DM) 6.25-15 MG/5ML syrup Take 5 mLs by mouth 4 (four) times daily as  needed. Patient not taking: Reported on 09/07/2024 01/14/24   Antonette Angeline ORN, NP  [Paused] WEGOVY 1 MG/0.5ML SOAJ SQ injection  09/03/24   [provider]                                                                                                                                    Past Surgical History Past Surgical History:  Procedure Laterality Date   BREAST BIOPSY Left 2017   BREAST LUMPECTOMY Left 2017   cervical fusiion  2012   JOINT REPLACEMENT     MITRAL VALVE REPAIR     MVP 1970   PORT-A-CATH REMOVAL N/A 10/23/2017   Procedure: REMOVAL PORT-A-CATH;  Surgeon: Aron Shoulders, MD;  Location: [facility] OR;  Service: General;  Laterality: N/A;   PORTACATH PLACEMENT Right 09/06/2016   Procedure: INSERTION PORT-A-CATH;  Surgeon: Shoulders Aron, MD;  Location: [facility];  Service: General;  Laterality: Right;   RADIOACTIVE SEED GUIDED PARTIAL MASTECTOMY WITH AXILLARY SENTINEL LYMPH NODE BIOPSY Left 09/06/2016   Procedure: RADIOACTIVE SEED GUIDED LEFT BREAST LUMPECTOMY  WITH SENTINEL LYMPH NODE BIOPSY;  Surgeon: Shoulders Aron, MD;  Location: [facility];  Service: General;  Laterality: Left;, Per patient just lumpectomy   ROTATOR CUFF REPAIR Right 2012   TOTAL HIP ARTHROPLASTY Right 09/14/2015   Procedure: RIGHT TOTAL HIP ARTHROPLASTY ANTERIOR APPROACH;  Surgeon: Kay CHRISTELLA Cummins, MD;  Location: [facility] OR;  Service: Orthopedics;  Laterality: Right;   TUBAL LIGATION  1988   Family History Family History  Adopted: Yes  Problem Relation Age of Onset   Ehlers-Danlos syndrome Sister    Breast cancer Brother 38       unknown   Healthy Son    Healthy Son    Healthy Son    Hypertension Son    Healthy Son    BRCA 1/2 Neg Hx     Social History Social History[1] Allergies Gabapentin, Naproxen, Neulasta  [pegfilgrastim ], Erythromycin, Gabapentin, Neulasta  [pegfilgrastim ], Tramadol, Erythromycin, Naproxen, and Tramadol hcl  Review of Systems Review of  Systems  Physical Exam Vital Signs  I have reviewed the triage vital signs BP (!) 142/82 (BP Location: Right Arm)   Pulse 77   Temp 98.4 F (36.9 C) (Oral)   Resp 17   Ht 5' 3 (1.6 m)   Wt 97.5 kg   LMP 08/29/2016   SpO2 94%   BMI 38.09  kg/m   Physical Exam Vitals and nursing note reviewed.  Eyes:     Pupils: Pupils are equal, round, and reactive to light.  Cardiovascular:     Rate and Rhythm: Normal rate.  Pulmonary:     Effort: Pulmonary effort is normal.  Abdominal:     General: Abdomen is flat. There is no distension.     Palpations: Abdomen is soft.     Tenderness: There is abdominal tenderness.  Musculoskeletal:        General: Normal range of motion.  Skin:    General: Skin is warm.  Neurological:     General: No focal deficit present.     Mental Status: She is alert.     ED Results and Treatments Labs (all labs ordered are listed, but only abnormal results are displayed) Labs Reviewed  COMPREHENSIVE METABOLIC PANEL WITH GFR - Abnormal; Notable for the following components:      Result Value   Sodium 134 (*)    Creatinine, Ser 1.13 (*)    Albumin  3.4 (*)    GFR, Estimated 55 (*)    All other components within normal limits  CBC - Abnormal; Notable for the following components:   WBC 12.7 (*)    RBC 3.58 (*)    Hemoglobin 10.6 (*)    HCT 31.7 (*)    All other components within normal limits  URINALYSIS, ROUTINE W REFLEX MICROSCOPIC - Abnormal; Notable for the following components:   Color, Urine STRAW (*)    Specific Gravity, Urine 1.002 (*)    Hgb urine dipstick SMALL (*)    Leukocytes,Ua SMALL (*)    Bacteria, UA RARE (*)    All other components within normal limits  RESP PANEL BY RT-PCR (RSV, FLU A&B, COVID)  RVPGX2  URINE CULTURE  LIPASE, BLOOD                                                                                                                          Radiology CT ABDOMEN PELVIS W CONTRAST Result Date: 09/11/2024 EXAM: CT  ABDOMEN AND PELVIS WITH CONTRAST 09/11/2024 09:15:00 PM TECHNIQUE: CT of the abdomen and pelvis was performed with the administration of 100 mL of iohexol  (OMNIPAQUE ) 300 MG/ML solution. Multiplanar reformatted images are provided for review. Automated exposure control, iterative reconstruction, and/or weight-based adjustment of the mA/kV was utilized to reduce the radiation dose to as low as reasonably achievable. COMPARISON: CT abdomen and pelvis 09/07/2024. CLINICAL HISTORY: Bowel obstruction suspected. FINDINGS: LOWER CHEST: There is atelectasis in the bilateral lung bases. LIVER: The liver is unremarkable. GALLBLADDER AND BILE DUCTS: Gallbladder is unremarkable. No biliary ductal dilatation. SPLEEN: No acute abnormality. PANCREAS: No acute abnormality. ADRENAL GLANDS: No acute abnormality. KIDNEYS, URETERS AND BLADDER: bilateral perinephric fat stranding has decreased in the interval. There is no hydronephrosis. There is bilateral renal cortical scarring similar to prior study. Subcentimeter rounded hypodensity in the inferior pole of the left kidney likely represents a cyst. Heterogeneous enhancement  throughout both kidneys more prominent on the delayed images appears similar to prior study. No perinephric fluid collection. Ureters now appear normal. No stones in the kidneys or ureters. Urinary bladder is unremarkable. GI AND BOWEL: Stomach demonstrates no acute abnormality. There are scattered colonic diverticula. The appendix is normal. There is no bowel obstruction. PERITONEUM AND RETROPERITONEUM: No ascites. No free air. VASCULATURE: Aorta is normal in caliber. There are atherosclerotic calcifications of the aorta. LYMPH NODES: No lymphadenopathy. REPRODUCTIVE ORGANS: Heterogeneous mass in the region of the uterine fundus measures 4 cm and appears unchanged, likely fibroid . BONES AND SOFT TISSUES: No acute osseous abnormality. Right hip arthroplasty present. No focal soft tissue abnormality. IMPRESSION:  1. Patchy opacities throughout both kidneys and perinephric fat stranding treatment decrease in the interval. The ureters now appear normal. Findings are likely related to resolving infection. 2. No evidence for bowel obstruction. Electronically signed by: Greig Pique MD 09/11/2024 10:43 PM EST RP Workstation: [id number]    Pertinent labs & imaging results that were available during my care of the patient were reviewed by me and considered in my medical decision making (see MDM for details).  Medications Ordered in ED Medications  lactated ringers  bolus 1,000 mL (has no administration in time range)  cefTRIAXone  (ROCEPHIN ) 1 g in sodium chloride  0.9 % 100 mL IVPB (has no administration in time range)  metoCLOPramide  (REGLAN ) injection 10 mg (10 mg Intravenous Given 09/11/24 2104)  sodium chloride  0.9 % bolus 1,000 mL (0 mLs Intravenous Stopped 09/11/24 2305)  iohexol  (OMNIPAQUE ) 300 MG/ML solution 100 mL (100 mLs Intravenous Contrast Given 09/11/24 2108)                                                                                                                                     Procedures Procedures  (including critical care time)  Medical Decision Making / ED Course   This patient presents to the ED for concern of abdominal pain and vomiting, this involves an extensive number of treatment options, and is a complaint that carries with it a high risk of complications and morbidity.  The differential diagnosis includes pyelonephritis, enteritis, gastroenteritis, intra-abdominal infection.  MDM: Patient's abdomen overall soft, generalized tenderness.  As patient is now vomiting, she cannot take her ciprofloxacin  as she is unable to keep anything down.  Will obtain repeat imaging of patient's abdomen.  Will provide her some IV fluids.  Patient will likely require repeat IV antibiotics and potential admission as she cannot complete her course of oral antibiotics.  Reassessment 11:30  PM-unfortunately, patient was unable to tolerate p.o.  Given that she has pyonephritis and cannot tolerate p.o., she will require admission.  Given her Rocephin  based on her previous cultures.  Will admit.   Additional history obtained: -Additional history obtained from family at bedside -External records from outside source obtained and reviewed including: Chart review including previous notes, labs, imaging, consultation notes  Lab Tests: -I ordered, reviewed, and interpreted labs.   The pertinent results include:   Labs Reviewed  COMPREHENSIVE METABOLIC PANEL WITH GFR - Abnormal; Notable for the following components:      Result Value   Sodium 134 (*)    Creatinine, Ser 1.13 (*)    Albumin  3.4 (*)    GFR, Estimated 55 (*)    All other components within normal limits  CBC - Abnormal; Notable for the following components:   WBC 12.7 (*)    RBC 3.58 (*)    Hemoglobin 10.6 (*)    HCT 31.7 (*)    All other components within normal limits  URINALYSIS, ROUTINE W REFLEX MICROSCOPIC - Abnormal; Notable for the following components:   Color, Urine STRAW (*)    Specific Gravity, Urine 1.002 (*)    Hgb urine dipstick SMALL (*)    Leukocytes,Ua SMALL (*)    Bacteria, UA RARE (*)    All other components within normal limits  RESP PANEL BY RT-PCR (RSV, FLU A&B, COVID)  RVPGX2  URINE CULTURE  LIPASE, BLOOD         Imaging Studies ordered: I ordered imaging studies including CT abdomen pelvis I independently visualized and interpreted imaging. I agree with the radiologist interpretation   Medicines ordered and prescription drug management: Meds ordered this encounter  Medications   metoCLOPramide  (REGLAN ) injection 10 mg   sodium chloride  0.9 % bolus 1,000 mL   lactated ringers  bolus 1,000 mL   iohexol  (OMNIPAQUE ) 300 MG/ML solution 100 mL   cefTRIAXone  (ROCEPHIN ) 1 g in sodium chloride  0.9 % 100 mL IVPB    Antibiotic Indication::   Other Indication (list below)    Other  Indication::   pyelonephritis    -I have reviewed the patients home medicines and have made adjustments as needed   Cardiac Monitoring: The patient was maintained on a cardiac monitor.  I personally viewed and interpreted the cardiac monitored which showed an underlying rhythm of: Normal sinus rhythm   Reevaluation: After the interventions noted above, I reevaluated the patient and found that they have :improved  Co morbidities that complicate the patient evaluation  Past Medical History:  Diagnosis Date   Anxiety    Arthritis    Breast cancer (HCC)    Cancer (HCC)    Cataract    left eye, per patient   Complication of anesthesia    DIFFICULTY WAKING UP , LAST SURGERY NECK 2012 WAS FINE PER PT HERE AT [facility]   Depression    Fibromyalgia    Genetic testing 12/21/2016   Genetic testing reported out on 12/20/2016 through [facility]'s 46-gene Common Hereditary Cancers panel found no deleterious mutations. The following 46 genes were analyzed: APC, ATM, AXIN2, BARD1, BMPR1A, BRCA1, BRCA2, BRIP1, CDH1, CDKN2A, CHEK2, CTNNA1, DICER1, EPCAM, GREM1, KIT, MEN1, MLH1, MSH2, MSH3, MSH6, MUTYH, NBN, NF1, NTHL1, PALB2, PDGFRA, PMS2, POLD1, POLE, PTEN, RAD50, RAD51C, RAD51D, SDH   Headache    HX MIGRAINES     Heart murmur    History of radiation therapy 02/14/17-04/03/2017   left breast 60.4 Gy: 50.4 Gy delivered in 28 fractions and a boost of 10 Gy delivered in 5 fractions.   Hypertension    Macular degeneration    right eye, per patient   Malignant neoplasm of upper-outer quadrant of left female breast (HCC) 08/23/2016   Mitral valve disorder    MVP (mitral valve prolapse)    AS INFANT   Neck pain    Personal history  of chemotherapy    Personal history of radiation therapy    Vision abnormalities        Final Clinical Impression(s) / ED Diagnoses Final diagnoses:  Nausea and vomiting, unspecified vomiting type  Pyelonephritis     @PCDICTATION @     [1]  Social History Tobacco  Use   Smoking status: Every Day    Current packs/day: 0.50    Types: Cigarettes    Passive exposure: Past   Smokeless tobacco: Never  Vaping Use   Vaping status: Never Used  Substance Use Topics   Alcohol use: Never   Drug use: Not Currently     Mannie Fairy DASEN, DO 09/11/24 2332  "

## 2024-09-11 NOTE — ED Triage Notes (Signed)
 Pt recently admitted for kidney and bladder infection, low sodium and potassium. D/c Wednesday on PO abx and states she is feeling worse again. Unable to eat, N/V, denies diarrhea.

## 2024-09-11 NOTE — H&P (Signed)
 " History and Physical    Stacy [name] FMW:[id number] DOB: 18-Aug-1961 DOA: 09/11/2024  PCP: Antonette Angeline ORN, NP   Patient coming from: Home   Chief Complaint:  Chief Complaint  Patient presents with   Emesis   ED TRIAGE note:  Pt recently admitted for kidney and bladder infection, low sodium and potassium. D/c Wednesday on PO abx and states she is feeling worse again. Unable to eat, N/V, denies diarrhea.       HPI:  Stacy [name] is a 63 y.o. female with medical history significant of essential hypertension, gout, fibromyalgia and chronic normocytic anemia presented emergency department with complaining of unable to tolerate p.o. intake due to nausea and vomiting.  Patient reported that since discharge she has worsening abdominal pain with multiple episodes of vomiting.  Reported that due to multiple episodes of vomiting she is not able to tolerate oral antibiotic at home.  Patient endorsing chills.  Patient denies any fever, constipation or diarrhea.  Denies any chest pain and palpitation.  Other complaint at this time.   Patient was admitted discharged from the hospital 12/24.  Patient was admitted for acute pyelonephritis-urine culture showed E. coli treated with IV Zosyn  afterward transition to ciprofloxacin  to complete 10 days course, AKI, hyponatremia, hypochloremia and hypokalemia.  Patient also found to have low hemoglobin due to dilution/IV fluid resuscitation.   ED Course:  At presentation to ED patient is hemodynamically.  Afebrile. Lab work, WBC show leukocytosis 12.7, stable H&H 10.6 and 31 normal platelet count 376.  CMP showing low sodium 134, creatinine 1.13 and GFR 55 renal function has been improving.  Normal hepatic function and lipase level.  UA showing persistent UTI.  CT abdomen pelvis showing: 1. Patchy opacities throughout both kidneys and perinephric fat stranding treatment decrease in the interval. The ureters now appear normal. Findings are  likely related to resolving infection. 2. No evidence for bowel obstruction.  In the ED patient received ceftriaxone  for pyelonephritis and 2 L of NS and LR bolus.  In the ED patient received Reglan  in the setting of multiple episodes of nausea vomiting. Hospitalist consulted for further eval management of intractable nausea vomiting and pyelonephritis.  Significant labs in the ED: Lab Orders         Resp panel by RT-PCR (RSV, Flu A&B, Covid) Anterior Nasal Swab         Urine Culture (for pregnant, neutropenic or urologic patients or patients with an indwelling urinary catheter)         Lipase, blood         Comprehensive metabolic panel         CBC         Urinalysis, Routine w reflex microscopic -Urine, Clean Catch         CBC         Basic metabolic panel       Review of Systems:  Review of Systems  Constitutional:  Positive for chills. Negative for fever, malaise/fatigue and weight loss.  Respiratory:  Negative for cough, sputum production and shortness of breath.   Cardiovascular:  Negative for chest pain and palpitations.  Gastrointestinal:  Positive for abdominal pain, nausea and vomiting. Negative for heartburn.  Genitourinary:  Negative for dysuria, flank pain, frequency and urgency.  Musculoskeletal:  Negative for back pain and myalgias.  Neurological:  Negative for dizziness.  Psychiatric/Behavioral:  The patient is not nervous/anxious.     Past Medical History:  Diagnosis Date   Anxiety  Arthritis    Breast cancer (HCC)    Cancer (HCC)    Cataract    left eye, per patient   Complication of anesthesia    DIFFICULTY WAKING UP , LAST SURGERY NECK 2012 WAS FINE PER PT HERE AT [facility]   Depression    Fibromyalgia    Genetic testing 12/21/2016   Genetic testing reported out on 12/20/2016 through [facility]'s 46-gene Common Hereditary Cancers panel found no deleterious mutations. The following 46 genes were analyzed: APC, ATM, AXIN2, BARD1, BMPR1A, BRCA1, BRCA2, BRIP1,  CDH1, CDKN2A, CHEK2, CTNNA1, DICER1, EPCAM, GREM1, KIT, MEN1, MLH1, MSH2, MSH3, MSH6, MUTYH, NBN, NF1, NTHL1, PALB2, PDGFRA, PMS2, POLD1, POLE, PTEN, RAD50, RAD51C, RAD51D, SDH   Headache    HX MIGRAINES     Heart murmur    History of radiation therapy 02/14/17-04/03/2017   left breast 60.4 Gy: 50.4 Gy delivered in 28 fractions and a boost of 10 Gy delivered in 5 fractions.   Hypertension    Macular degeneration    right eye, per patient   Malignant neoplasm of upper-outer quadrant of left female breast (HCC) 08/23/2016   Mitral valve disorder    MVP (mitral valve prolapse)    AS INFANT   Neck pain    Personal history of chemotherapy    Personal history of radiation therapy    Vision abnormalities     Past Surgical History:  Procedure Laterality Date   BREAST BIOPSY Left 2017   BREAST LUMPECTOMY Left 2017   cervical fusiion  2012   JOINT REPLACEMENT     MITRAL VALVE REPAIR     MVP 1970   PORT-A-CATH REMOVAL N/A 10/23/2017   Procedure: REMOVAL PORT-A-CATH;  Surgeon: Aron Shoulders, MD;  Location: [facility] OR;  Service: General;  Laterality: N/A;   PORTACATH PLACEMENT Right 09/06/2016   Procedure: INSERTION PORT-A-CATH;  Surgeon: Shoulders Aron, MD;  Location: [facility];  Service: General;  Laterality: Right;   RADIOACTIVE SEED GUIDED PARTIAL MASTECTOMY WITH AXILLARY SENTINEL LYMPH NODE BIOPSY Left 09/06/2016   Procedure: RADIOACTIVE SEED GUIDED LEFT BREAST LUMPECTOMY  WITH SENTINEL LYMPH NODE BIOPSY;  Surgeon: Shoulders Aron, MD;  Location: [facility];  Service: General;  Laterality: Left;, Per patient just lumpectomy   ROTATOR CUFF REPAIR Right 2012   TOTAL HIP ARTHROPLASTY Right 09/14/2015   Procedure: RIGHT TOTAL HIP ARTHROPLASTY ANTERIOR APPROACH;  Surgeon: Kay CHRISTELLA Cummins, MD;  Location: [facility] OR;  Service: Orthopedics;  Laterality: Right;   TUBAL LIGATION  1988     reports that she has been smoking cigarettes. She has been exposed to tobacco smoke. She has  never used smokeless tobacco. She reports that she does not currently use drugs. She reports that she does not drink alcohol.  Allergies[1]  Family History  Adopted: Yes  Problem Relation Age of Onset   Ehlers-Danlos syndrome Sister    Breast cancer Brother 64       unknown   Healthy Son    Healthy Son    Healthy Son    Hypertension Son    Healthy Son    BRCA 1/2 Neg Hx     Prior to Admission medications  Medication Sig Start Date End Date Taking? Authorizing Provider  acetaminophen  (TYLENOL ) 325 MG tablet Take 2 tablets (650 mg total) by mouth every 6 (six) hours as needed for mild pain (pain score 1-3), headache or fever. 09/09/24   Jerelene Critchley, MD  ciprofloxacin  (CIPRO ) 500 MG tablet Take 1 tablet (500 mg total)  by mouth 2 (two) times daily for 8 days. 09/09/24 09/17/24  Jerelene Critchley, MD  [Paused] hydrochlorothiazide  (HYDRODIURIL ) 25 MG tablet TAKE 1 TABLET (25 MG TOTAL) BY MOUTH DAILY. Wait to take this until your doctor or other care provider tells you to start again. 09/01/24   Antonette Angeline ORN, NP  [Paused] olmesartan  (BENICAR ) 20 MG tablet Take 0.5 tablets (10 mg total) by mouth daily. Patient not taking: Reported on 09/09/2024 Wait to take this until your doctor or other care provider tells you to start again. 01/14/24   Antonette Angeline ORN, NP  oxyCODONE  (OXY IR/ROXICODONE ) 5 MG immediate release tablet Take 0.5-1 tablets (2.5-5 mg total) by mouth every 4 (four) hours as needed for moderate pain (pain score 4-6) or severe pain (pain score 7-10). 09/09/24   Jerelene Critchley, MD  prochlorperazine  (COMPAZINE ) 10 MG tablet Take 0.5 tablets (5 mg total) by mouth every 6 (six) hours as needed for nausea or vomiting. 09/09/24   Jerelene Critchley, MD  promethazine -dextromethorphan (PROMETHAZINE -DM) 6.25-15 MG/5ML syrup Take 5 mLs by mouth 4 (four) times daily as needed. Patient not taking: Reported on 09/07/2024 01/14/24   Antonette Angeline ORN, NP  [Paused] WEGOVY 1 MG/0.5ML SOAJ SQ  injection  09/03/24   [provider]     Physical Exam: Vitals:   09/11/24 1708 09/11/24 1709 09/11/24 2032  BP: (!) 168/102  (!) 142/82  Pulse: 81  77  Resp: 16  17  Temp: 98.1 F (36.7 C)  98.4 F (36.9 C)  TempSrc: Oral  Oral  SpO2: 99%  94%  Weight:  97.5 kg   Height:  5' 3 (1.6 m)     Physical Exam Vitals and nursing note reviewed.  Constitutional:      General: She is not in acute distress.    Appearance: She is ill-appearing.  HENT:     Mouth/Throat:     Mouth: Mucous membranes are moist.  Eyes:     Pupils: Pupils are equal, round, and reactive to light.  Cardiovascular:     Rate and Rhythm: Normal rate and regular rhythm.     Pulses: Normal pulses.     Heart sounds: Normal heart sounds.  Pulmonary:     Effort: Pulmonary effort is normal.     Breath sounds: Normal breath sounds.  Abdominal:     General: Bowel sounds are normal.     Palpations: Abdomen is soft.  Musculoskeletal:     Cervical back: Neck supple.  Skin:    Capillary Refill: Capillary refill takes less than 2 seconds.  Neurological:     Mental Status: She is alert and oriented to person, place, and time.  Psychiatric:        Mood and Affect: Mood normal.      Labs on Admission: I have personally reviewed following labs and imaging studies  CBC: Recent Labs  Lab 09/07/24 1136 09/08/24 0442 09/09/24 0447 09/11/24 1746  WBC 22.6* 18.6* 13.3* 12.7*  HGB 14.7 12.1 10.3* 10.6*  HCT 42.6 34.6* 29.4* 31.7*  MCV 85.7 85.4 84.7 88.5  PLT 229 201 193 376   Basic Metabolic Panel: Recent Labs  Lab 09/07/24 1136 09/08/24 0442 09/08/24 1454 09/09/24 0447 09/11/24 1746  NA 129* 127* 126* 128* 134*  K 3.7 3.4* 4.3 3.5 3.6  CL 90* 93* 92* 94* 99  CO2 21* 20* 23 20* 22  GLUCOSE 140* 108* 127* 108* 92  BUN 29* 29* 25* 21* 13  CREATININE 1.62* 1.77* 1.77* 1.60* 1.13*  CALCIUM 9.1 8.1* 8.2* 8.1* 8.9  MG  --  2.3  --  2.3  --    GFR: Estimated Creatinine Clearance: 58.1 mL/min  (A) (by C-G formula based on SCr of 1.13 mg/dL (H)). Liver Function Tests: Recent Labs  Lab 09/07/24 1136 09/11/24 1746  AST 23 28  ALT 14 22  ALKPHOS 83 93  BILITOT 1.4* 0.7  PROT 7.8 7.0  ALBUMIN  3.8 3.4*   Recent Labs  Lab 09/07/24 1136 09/11/24 1746  LIPASE 17 38   No results for input(s): AMMONIA in the last 168 hours. Coagulation Profile: No results for input(s): INR, PROTIME in the last 168 hours. Cardiac Enzymes: No results for input(s): CKTOTAL, CKMB, CKMBINDEX, TROPONINI, TROPONINIHS in the last 168 hours. BNP (last 3 results) No results for input(s): BNP in the last 8760 hours. HbA1C: No results for input(s): HGBA1C in the last 72 hours. CBG: No results for input(s): GLUCAP in the last 168 hours. Lipid Profile: No results for input(s): CHOL, HDL, LDLCALC, TRIG, CHOLHDL, LDLDIRECT in the last 72 hours. Thyroid  Function Tests: No results for input(s): TSH, T4TOTAL, FREET4, T3FREE, THYROIDAB in the last 72 hours. Anemia Panel: No results for input(s): VITAMINB12, FOLATE, FERRITIN, TIBC, IRON, RETICCTPCT in the last 72 hours. Urine analysis:    Component Value Date/Time   COLORURINE STRAW (A) 09/11/2024 1746   APPEARANCEUR CLEAR 09/11/2024 1746   LABSPEC 1.002 (L) 09/11/2024 1746   LABSPEC 1.005 11/07/2016 1048   PHURINE 6.0 09/11/2024 1746   GLUCOSEU NEGATIVE 09/11/2024 1746   GLUCOSEU Negative 11/07/2016 1048   HGBUR SMALL (A) 09/11/2024 1746   BILIRUBINUR NEGATIVE 09/11/2024 1746   BILIRUBINUR Negative 11/07/2016 1048   KETONESUR NEGATIVE 09/11/2024 1746   PROTEINUR NEGATIVE 09/11/2024 1746   UROBILINOGEN 0.2 11/07/2016 1048   NITRITE NEGATIVE 09/11/2024 1746   LEUKOCYTESUR SMALL (A) 09/11/2024 1746   LEUKOCYTESUR Negative 11/07/2016 1048    Radiological Exams on Admission: I have personally reviewed images CT ABDOMEN PELVIS W CONTRAST Result Date: 09/11/2024 EXAM: CT ABDOMEN AND PELVIS WITH  CONTRAST 09/11/2024 09:15:00 PM TECHNIQUE: CT of the abdomen and pelvis was performed with the administration of 100 mL of iohexol  (OMNIPAQUE ) 300 MG/ML solution. Multiplanar reformatted images are provided for review. Automated exposure control, iterative reconstruction, and/or weight-based adjustment of the mA/kV was utilized to reduce the radiation dose to as low as reasonably achievable. COMPARISON: CT abdomen and pelvis 09/07/2024. CLINICAL HISTORY: Bowel obstruction suspected. FINDINGS: LOWER CHEST: There is atelectasis in the bilateral lung bases. LIVER: The liver is unremarkable. GALLBLADDER AND BILE DUCTS: Gallbladder is unremarkable. No biliary ductal dilatation. SPLEEN: No acute abnormality. PANCREAS: No acute abnormality. ADRENAL GLANDS: No acute abnormality. KIDNEYS, URETERS AND BLADDER: bilateral perinephric fat stranding has decreased in the interval. There is no hydronephrosis. There is bilateral renal cortical scarring similar to prior study. Subcentimeter rounded hypodensity in the inferior pole of the left kidney likely represents a cyst. Heterogeneous enhancement throughout both kidneys more prominent on the delayed images appears similar to prior study. No perinephric fluid collection. Ureters now appear normal. No stones in the kidneys or ureters. Urinary bladder is unremarkable. GI AND BOWEL: Stomach demonstrates no acute abnormality. There are scattered colonic diverticula. The appendix is normal. There is no bowel obstruction. PERITONEUM AND RETROPERITONEUM: No ascites. No free air. VASCULATURE: Aorta is normal in caliber. There are atherosclerotic calcifications of the aorta. LYMPH NODES: No lymphadenopathy. REPRODUCTIVE ORGANS: Heterogeneous mass in the region of the uterine fundus measures 4 cm and appears unchanged, likely  fibroid . BONES AND SOFT TISSUES: No acute osseous abnormality. Right hip arthroplasty present. No focal soft tissue abnormality. IMPRESSION: 1. Patchy opacities  throughout both kidneys and perinephric fat stranding treatment decrease in the interval. The ureters now appear normal. Findings are likely related to resolving infection. 2. No evidence for bowel obstruction. Electronically signed by: Greig Pique MD 09/11/2024 10:43 PM EST RP Workstation: [id number]        Assessment/Plan: Principal Problem:   Acute pyelonephritis Active Problems:   Essential hypertension   Fibromyalgia   Anxiety and depression   Gout   Normocytic anemia   Intractable nausea and vomiting    Assessment and Plan: Acute pyelonephritis Acute cystitis Recent episode of acute pyelonephritis acute cystitis discharged from hospital 12/24 -Patient was recently admitted for acute pyelonephritis discharged home on 12/24 presenting today with intractable nausea vomiting.  Denies any fever and chills.  Unable to tolerate oral antibiotic and diet at home. - At presentation to ED patient is hemodynamically stable.  CBC showing improvement of leukocytosis.  UA showing persistent UTI.  CT abdomen showing persistent pyelonephritis with subtle improvement. In the ED patient received IV ceftriaxone  and 1 L of NS bolus. - Based on previous urine culture is repeating continue IV ceftriaxone  2 g daily. Obtain a repeat urine culture. Need to follow-up with urine culture for antibiotic guidance.   Intractable nausea vomiting - Patient presenting with intractable nausea vomiting with poor oral tolerance of food and medications. - Continue IV Zofran  and Compazine  as needed.   History normocytic anemia -Stable H&H continue to monitor.  Essential hypertension -Currently hydrochlorothiazide  and losartan on hold in the setting of recent AKI.  Continue IV hydralazine  as needed.   Recent episodes of acute AKI-renal function improving gradually -Creatinine 1.1 GFR 55.  Recent episodes of AKI which has been improving.  Continue monitor renal function.  Avoid intoxication.  Continue  maintenance fluid LR 75 cc/h.  LR   DVT prophylaxis:  Lovenox  Code Status:  Full Code Diet: Heart healthy diet Family Communication:   Family was present at bedside, at the time of interview.  Opportunity was given to ask question and all questions were answered satisfactorily.  Disposition Plan: Need to follow-up with urine culture result and monitor improvement of renal function. Consults:   None indicated at this time Admission status:   Observation, Telemetry bed  Severity of Illness: The appropriate patient status for this patient is OBSERVATION. Observation status is judged to be reasonable and necessary in order to provide the required intensity of service to ensure the patient's safety. The patient's presenting symptoms, physical exam findings, and initial radiographic and laboratory data in the context of their medical condition is felt to place them at decreased risk for further clinical deterioration. Furthermore, it is anticipated that the patient will be medically stable for discharge from the hospital within 2 midnights of admission.     Kellan Raffield, MD [facility]  How to contact the [facility] Attending or Consulting provider 7A - 7P or covering provider during after hours 7P -7A, for this patient.  Check the care team in [facility] and look for a) attending/consulting [facility] provider listed and b) the [facility] team listed Log into [email or web address] and use [facility]'s universal password to access. If you do not have the password, please contact the hospital operator. Locate the [facility] provider you are looking for under [facility] and page to a number that you can be directly reached. If you still have difficulty reaching the  provider, please page the [facility] (Director on Call) for the Hospitalists listed on amion for assistance.  09/12/2024, 12:47 AM           [1]  Allergies Allergen Reactions   Gabapentin Swelling    makes me feel detached   Naproxen Palpitations    Neulasta  [Pegfilgrastim ] Itching and Other (See Comments)    Patient stated,reddish skin tone, body on fire, tingly tongue.   Erythromycin Nausea And Vomiting   Gabapentin Other (See Comments)    Makes me feel out of my body   Neulasta  [Pegfilgrastim ] Itching   Tramadol Other (See Comments)    headache   Erythromycin Nausea And Vomiting   Naproxen Palpitations   Tramadol Hcl Other (See Comments)    Headaches   "

## 2024-09-12 ENCOUNTER — Encounter (HOSPITAL_COMMUNITY): Payer: Self-pay | Admitting: Internal Medicine

## 2024-09-12 DIAGNOSIS — E876 Hypokalemia: Secondary | ICD-10-CM | POA: Diagnosis present

## 2024-09-12 DIAGNOSIS — E878 Other disorders of electrolyte and fluid balance, not elsewhere classified: Secondary | ICD-10-CM | POA: Diagnosis present

## 2024-09-12 DIAGNOSIS — D649 Anemia, unspecified: Secondary | ICD-10-CM | POA: Diagnosis present

## 2024-09-12 DIAGNOSIS — N3000 Acute cystitis without hematuria: Secondary | ICD-10-CM | POA: Diagnosis present

## 2024-09-12 DIAGNOSIS — I10 Essential (primary) hypertension: Secondary | ICD-10-CM | POA: Diagnosis present

## 2024-09-12 DIAGNOSIS — M109 Gout, unspecified: Secondary | ICD-10-CM | POA: Diagnosis present

## 2024-09-12 DIAGNOSIS — H3530 Unspecified macular degeneration: Secondary | ICD-10-CM | POA: Diagnosis present

## 2024-09-12 DIAGNOSIS — R112 Nausea with vomiting, unspecified: Secondary | ICD-10-CM | POA: Diagnosis present

## 2024-09-12 DIAGNOSIS — F17210 Nicotine dependence, cigarettes, uncomplicated: Secondary | ICD-10-CM | POA: Diagnosis present

## 2024-09-12 DIAGNOSIS — F411 Generalized anxiety disorder: Secondary | ICD-10-CM | POA: Diagnosis present

## 2024-09-12 DIAGNOSIS — E66812 Obesity, class 2: Secondary | ICD-10-CM | POA: Diagnosis present

## 2024-09-12 DIAGNOSIS — Z803 Family history of malignant neoplasm of breast: Secondary | ICD-10-CM | POA: Diagnosis not present

## 2024-09-12 DIAGNOSIS — M1990 Unspecified osteoarthritis, unspecified site: Secondary | ICD-10-CM | POA: Diagnosis present

## 2024-09-12 DIAGNOSIS — Z923 Personal history of irradiation: Secondary | ICD-10-CM | POA: Diagnosis not present

## 2024-09-12 DIAGNOSIS — Z96641 Presence of right artificial hip joint: Secondary | ICD-10-CM | POA: Diagnosis present

## 2024-09-12 DIAGNOSIS — Z79899 Other long term (current) drug therapy: Secondary | ICD-10-CM | POA: Diagnosis not present

## 2024-09-12 DIAGNOSIS — E871 Hypo-osmolality and hyponatremia: Secondary | ICD-10-CM | POA: Diagnosis present

## 2024-09-12 DIAGNOSIS — Z6838 Body mass index (BMI) 38.0-38.9, adult: Secondary | ICD-10-CM | POA: Diagnosis not present

## 2024-09-12 DIAGNOSIS — M797 Fibromyalgia: Secondary | ICD-10-CM | POA: Diagnosis present

## 2024-09-12 DIAGNOSIS — Z9221 Personal history of antineoplastic chemotherapy: Secondary | ICD-10-CM | POA: Diagnosis not present

## 2024-09-12 DIAGNOSIS — N10 Acute tubulo-interstitial nephritis: Secondary | ICD-10-CM | POA: Diagnosis present

## 2024-09-12 DIAGNOSIS — F32A Depression, unspecified: Secondary | ICD-10-CM | POA: Diagnosis present

## 2024-09-12 DIAGNOSIS — N179 Acute kidney failure, unspecified: Secondary | ICD-10-CM | POA: Diagnosis present

## 2024-09-12 DIAGNOSIS — Z853 Personal history of malignant neoplasm of breast: Secondary | ICD-10-CM | POA: Diagnosis not present

## 2024-09-12 LAB — BASIC METABOLIC PANEL WITH GFR
Anion gap: 12 (ref 5–15)
BUN: 11 mg/dL (ref 8–23)
CO2: 24 mmol/L (ref 22–32)
Calcium: 8.5 mg/dL — ABNORMAL LOW (ref 8.9–10.3)
Chloride: 101 mmol/L (ref 98–111)
Creatinine, Ser: 1.19 mg/dL — ABNORMAL HIGH (ref 0.44–1.00)
GFR, Estimated: 52 mL/min — ABNORMAL LOW
Glucose, Bld: 106 mg/dL — ABNORMAL HIGH (ref 70–99)
Potassium: 3.8 mmol/L (ref 3.5–5.1)
Sodium: 137 mmol/L (ref 135–145)

## 2024-09-12 LAB — CULTURE, BLOOD (ROUTINE X 2)
Culture: NO GROWTH
Culture: NO GROWTH
Special Requests: ADEQUATE

## 2024-09-12 LAB — CBC
HCT: 32 % — ABNORMAL LOW (ref 36.0–46.0)
Hemoglobin: 10.6 g/dL — ABNORMAL LOW (ref 12.0–15.0)
MCH: 29.4 pg (ref 26.0–34.0)
MCHC: 33.1 g/dL (ref 30.0–36.0)
MCV: 88.6 fL (ref 80.0–100.0)
Platelets: 424 K/uL — ABNORMAL HIGH (ref 150–400)
RBC: 3.61 MIL/uL — ABNORMAL LOW (ref 3.87–5.11)
RDW: 13.5 % (ref 11.5–15.5)
WBC: 12.4 K/uL — ABNORMAL HIGH (ref 4.0–10.5)
nRBC: 0 % (ref 0.0–0.2)

## 2024-09-12 LAB — OSMOLALITY: Osmolality: 270 mosm/kg — ABNORMAL LOW (ref 275–295)

## 2024-09-12 LAB — OSMOLALITY, URINE: Osmolality, Ur: 190 mosm/kg — ABNORMAL LOW (ref 300–900)

## 2024-09-12 LAB — RESP PANEL BY RT-PCR (RSV, FLU A&B, COVID)  RVPGX2
Influenza A by PCR: NEGATIVE
Influenza B by PCR: NEGATIVE
Resp Syncytial Virus by PCR: NEGATIVE
SARS Coronavirus 2 by RT PCR: NEGATIVE

## 2024-09-12 MED ORDER — MORPHINE SULFATE (PF) 2 MG/ML IV SOLN: 1.0000 mg | INTRAVENOUS | Status: DC | PRN

## 2024-09-12 MED ORDER — OXYCODONE HCL 5 MG PO TABS
5.0000 mg | ORAL_TABLET | Freq: Four times a day (QID) | ORAL | Status: DC | PRN
  Administered 2024-09-12: 5 mg via ORAL
  Filled 2024-09-12: qty 1

## 2024-09-12 MED ORDER — MORPHINE SULFATE (PF) 2 MG/ML IV SOLN
1.0000 mg | Freq: Once | INTRAVENOUS | Status: AC
  Administered 2024-09-12: 1 mg via INTRAVENOUS
  Filled 2024-09-12: qty 1

## 2024-09-12 MED ORDER — ENSURE PLUS HIGH PROTEIN PO LIQD
237.0000 mL | Freq: Two times a day (BID) | ORAL | Status: DC
  Administered 2024-09-14: 237 mL via ORAL

## 2024-09-12 MED ORDER — HYDROCODONE-ACETAMINOPHEN 5-325 MG PO TABS
1.0000 | ORAL_TABLET | Freq: Four times a day (QID) | ORAL | Status: DC | PRN
  Filled 2024-09-12: qty 1

## 2024-09-12 NOTE — Plan of Care (Signed)

## 2024-09-12 NOTE — Progress Notes (Signed)
 "  PROGRESS NOTE  Stacy [name] FMW:[id number] DOB: [birth year] DOA: 09/11/2024 PCP: Antonette Angeline ORN, NP  HPI/Recap of past 24 hours: Stacy [name] is a 63 y.o. female with medical history significant of essential hypertension, gout, fibromyalgia and chronic normocytic anemia presented to the ED, complaining of abdominal pain, intractable nausea/vomiting, with poor oral intake and chills since discharge.  Patient unable to tolerate oral antibiotics prescribed to her prior to discharge on 12/24.  Of note, patient was admitted for acute pyelonephritis, treated with IV Zosyn  and transition to complete 10 days of ciprofloxacin .  Due to the Christmas holiday, patient requested to discharge home as she wanted to spend Christmas with family.  In the ED, vital signs fairly stable, WBC 12.7, UA showed small leukocytes, rare bacteria, 0-5 WBC.  CT A/P showed patchy opacities throughout both kidneys with perinephric fat stranding, which all appear to be resolving.  No noted bowel obstruction.  Patient admitted for further management.     Today, patient still reports nausea, poor oral intake, with generalized fatigue.  Still reporting some generalized abdominal pain, denies any chest pain, shortness of breath, diarrhea.    Assessment/Plan: Principal Problem:   Acute pyelonephritis Active Problems:   Essential hypertension   Fibromyalgia   Anxiety and depression   Gout   Normocytic anemia   Intractable nausea and vomiting   Acute pyelonephritis Intractable nausea/vomiting Currently afebrile, with leukocytosis UA showed small leukocytes, rare bacteria, 0-5 WBC Previous urine culture grew 80,000 E. Coli, repeat pending CT A/P showed patchy opacities throughout both kidneys with perinephric fat stranding, which all appear to be resolving.  No noted bowel obstruction Continue IV ceftriaxone , IV fluids, antiemetics Monitor closely  AKI Improving Continue IV fluid Daily BMP   Normocytic  anemia Daily CBC   Essential hypertension BP stable Hold PTA hydrochlorothiazide  and losartan in the setting of AKI  Continue IV hydralazine  as needed.   Obesity class II Lifestyle modification advised    Estimated body mass index is 38.09 kg/m as calculated from the following:   Height as of this encounter: 5' 3 (1.6 m).   Weight as of this encounter: 97.5 kg.     Code Status: Full  Family Communication: None at bedside  Disposition Plan: Status is: Inpatient Remains inpatient appropriate because: Level of care      Consultants: None  Procedures: None  Antimicrobials: IV ceftriaxone   DVT prophylaxis: Lovenox    Objective: Vitals:   09/12/24 0400 09/12/24 0600 09/12/24 1115 09/12/24 1516  BP: 108/72 137/65 (!) 128/38 (!) 141/70  Pulse: 73 68 65 74  Resp: (!) 25 (!) 23 18 16   Temp: 98.5 F (36.9 C) 98.5 F (36.9 C) 98.3 F (36.8 C) 98.9 F (37.2 C)  TempSrc:   Oral   SpO2: 90% 93% 94% 95%  Weight:      Height:        Intake/Output Summary (Last 24 hours) at 09/12/2024 1724 Last data filed at 09/12/2024 1128 Gross per 24 hour  Intake 1200 ml  Output --  Net 1200 ml   Filed Weights   09/11/24 1709  Weight: 97.5 kg    Exam: General: NAD  Cardiovascular: S1, S2 present Respiratory: CTAB Abdomen: Soft, tender, nondistended, bowel sounds present Musculoskeletal: No bilateral pedal edema noted Skin: Normal Psychiatry: Normal mood     Data Reviewed: CBC: Recent Labs  Lab 09/07/24 1136 09/08/24 0442 09/09/24 0447 09/11/24 1746 09/12/24 0440  WBC 22.6* 18.6* 13.3* 12.7* 12.4*  HGB 14.7 12.1  10.3* 10.6* 10.6*  HCT 42.6 34.6* 29.4* 31.7* 32.0*  MCV 85.7 85.4 84.7 88.5 88.6  PLT 229 201 193 376 424*   Basic Metabolic Panel: Recent Labs  Lab 09/08/24 0442 09/08/24 1454 09/09/24 0447 09/11/24 1746 09/12/24 0440  NA 127* 126* 128* 134* 137  K 3.4* 4.3 3.5 3.6 3.8  CL 93* 92* 94* 99 101  CO2 20* 23 20* 22 24  GLUCOSE 108* 127*  108* 92 106*  BUN 29* 25* 21* 13 11  CREATININE 1.77* 1.77* 1.60* 1.13* 1.19*  CALCIUM 8.1* 8.2* 8.1* 8.9 8.5*  MG 2.3  --  2.3  --   --    GFR: Estimated Creatinine Clearance: 55.2 mL/min (A) (by C-G formula based on SCr of 1.19 mg/dL (H)). Liver Function Tests: Recent Labs  Lab 09/07/24 1136 09/11/24 1746  AST 23 28  ALT 14 22  ALKPHOS 83 93  BILITOT 1.4* 0.7  PROT 7.8 7.0  ALBUMIN  3.8 3.4*   Recent Labs  Lab 09/07/24 1136 09/11/24 1746  LIPASE 17 38   No results for input(s): AMMONIA in the last 168 hours. Coagulation Profile: No results for input(s): INR, PROTIME in the last 168 hours. Cardiac Enzymes: No results for input(s): CKTOTAL, CKMB, CKMBINDEX, TROPONINI in the last 168 hours. BNP (last 3 results) No results for input(s): PROBNP in the last 8760 hours. HbA1C: No results for input(s): HGBA1C in the last 72 hours. CBG: No results for input(s): GLUCAP in the last 168 hours. Lipid Profile: No results for input(s): CHOL, HDL, LDLCALC, TRIG, CHOLHDL, LDLDIRECT in the last 72 hours. Thyroid  Function Tests: No results for input(s): TSH, T4TOTAL, FREET4, T3FREE, THYROIDAB in the last 72 hours. Anemia Panel: No results for input(s): VITAMINB12, FOLATE, FERRITIN, TIBC, IRON, RETICCTPCT in the last 72 hours. Urine analysis:    Component Value Date/Time   COLORURINE STRAW (A) 09/11/2024 1746   APPEARANCEUR CLEAR 09/11/2024 1746   LABSPEC 1.002 (L) 09/11/2024 1746   LABSPEC 1.005 11/07/2016 1048   PHURINE 6.0 09/11/2024 1746   GLUCOSEU NEGATIVE 09/11/2024 1746   GLUCOSEU Negative 11/07/2016 1048   HGBUR SMALL (A) 09/11/2024 1746   BILIRUBINUR NEGATIVE 09/11/2024 1746   BILIRUBINUR Negative 11/07/2016 1048   KETONESUR NEGATIVE 09/11/2024 1746   PROTEINUR NEGATIVE 09/11/2024 1746   UROBILINOGEN 0.2 11/07/2016 1048   NITRITE NEGATIVE 09/11/2024 1746   LEUKOCYTESUR SMALL (A) 09/11/2024 1746   LEUKOCYTESUR  Negative 11/07/2016 1048   Sepsis Labs: @LABRCNTIP (procalcitonin:4,lacticidven:4)  ) Recent Results (from the past 240 hours)  Urine Culture     Status: Abnormal   Collection Time: 09/07/24  2:49 PM   Specimen: Urine, Clean Catch  Result Value Ref Range Status   Specimen Description URINE, CLEAN CATCH  Final   Special Requests NONE  Final   Culture 80,000 COLONIES/mL ESCHERICHIA COLI (A)  Final   Report Status 09/09/2024 FINAL  Final   Organism ID, Bacteria ESCHERICHIA COLI (A)  Final      Susceptibility   Escherichia coli - MIC*    AMPICILLIN >=32 RESISTANT Resistant     CEFAZOLIN  (URINE) Value in next row Sensitive      4 SENSITIVEThis is a modified FDA-approved test that has been validated and its performance characteristics determined by the reporting laboratory.  This laboratory is certified under the Clinical Laboratory Improvement Amendments CLIA as qualified to perform high complexity clinical laboratory testing.    CEFEPIME Value in next row Sensitive      4 SENSITIVEThis is a modified FDA-approved  test that has been validated and its performance characteristics determined by the reporting laboratory.  This laboratory is certified under the Clinical Laboratory Improvement Amendments CLIA as qualified to perform high complexity clinical laboratory testing.    ERTAPENEM Value in next row Sensitive      4 SENSITIVEThis is a modified FDA-approved test that has been validated and its performance characteristics determined by the reporting laboratory.  This laboratory is certified under the Clinical Laboratory Improvement Amendments CLIA as qualified to perform high complexity clinical laboratory testing.    CEFTRIAXONE  Value in next row Sensitive      4 SENSITIVEThis is a modified FDA-approved test that has been validated and its performance characteristics determined by the reporting laboratory.  This laboratory is certified under the Clinical Laboratory Improvement Amendments CLIA as  qualified to perform high complexity clinical laboratory testing.    CIPROFLOXACIN  Value in next row Sensitive      4 SENSITIVEThis is a modified FDA-approved test that has been validated and its performance characteristics determined by the reporting laboratory.  This laboratory is certified under the Clinical Laboratory Improvement Amendments CLIA as qualified to perform high complexity clinical laboratory testing.    GENTAMICIN Value in next row Sensitive      4 SENSITIVEThis is a modified FDA-approved test that has been validated and its performance characteristics determined by the reporting laboratory.  This laboratory is certified under the Clinical Laboratory Improvement Amendments CLIA as qualified to perform high complexity clinical laboratory testing.    NITROFURANTOIN Value in next row Sensitive      4 SENSITIVEThis is a modified FDA-approved test that has been validated and its performance characteristics determined by the reporting laboratory.  This laboratory is certified under the Clinical Laboratory Improvement Amendments CLIA as qualified to perform high complexity clinical laboratory testing.    TRIMETH/SULFA Value in next row Sensitive      4 SENSITIVEThis is a modified FDA-approved test that has been validated and its performance characteristics determined by the reporting laboratory.  This laboratory is certified under the Clinical Laboratory Improvement Amendments CLIA as qualified to perform high complexity clinical laboratory testing.    AMPICILLIN/SULBACTAM Value in next row Intermediate      4 SENSITIVEThis is a modified FDA-approved test that has been validated and its performance characteristics determined by the reporting laboratory.  This laboratory is certified under the Clinical Laboratory Improvement Amendments CLIA as qualified to perform high complexity clinical laboratory testing.    PIP/TAZO Value in next row Sensitive      <=4 SENSITIVEThis is a modified  FDA-approved test that has been validated and its performance characteristics determined by the reporting laboratory.  This laboratory is certified under the Clinical Laboratory Improvement Amendments CLIA as qualified to perform high complexity clinical laboratory testing.    MEROPENEM Value in next row Sensitive      <=4 SENSITIVEThis is a modified FDA-approved test that has been validated and its performance characteristics determined by the reporting laboratory.  This laboratory is certified under the Clinical Laboratory Improvement Amendments CLIA as qualified to perform high complexity clinical laboratory testing.    * 80,000 COLONIES/mL ESCHERICHIA COLI  Culture, blood (Routine X 2) w Reflex to ID Panel     Status: None   Collection Time: 09/07/24  6:26 PM   Specimen: BLOOD  Result Value Ref Range Status   Specimen Description BLOOD RIGHT ANTECUBITAL  Final   Special Requests   Final    BOTTLES DRAWN AEROBIC AND ANAEROBIC Blood  Culture adequate volume   Culture   Final    NO GROWTH 5 DAYS Performed at [facility], [address] [place], [place] [address]    Report Status 09/12/2024 FINAL  Final  Culture, blood (Routine X 2) w Reflex to ID Panel     Status: None   Collection Time: 09/07/24  6:26 PM   Specimen: BLOOD  Result Value Ref Range Status   Specimen Description BLOOD BLOOD LEFT FOREARM  Final   Special Requests   Final    BOTTLES DRAWN AEROBIC AND ANAEROBIC Blood Culture results may not be optimal due to an inadequate volume of blood received in culture bottles   Culture   Final    NO GROWTH 5 DAYS Performed at [facility], [address] [place], [place] [address]    Report Status 09/12/2024 FINAL  Final  Resp panel by RT-PCR (RSV, Flu A&B, Covid) Anterior Nasal Swab     Status: None   Collection Time: 09/12/24  1:43 AM   Specimen: Anterior Nasal Swab  Result Value Ref Range Status   SARS Coronavirus 2 by RT PCR NEGATIVE NEGATIVE Final     Comment: (NOTE) SARS-CoV-2 target nucleic acids are NOT DETECTED.  The SARS-CoV-2 RNA is generally detectable in upper respiratory specimens during the acute phase of infection. The lowest concentration of SARS-CoV-2 viral copies this assay can detect is 138 copies/mL. A negative result does not preclude SARS-Cov-2 infection and should not be used as the sole basis for treatment or other patient management decisions. A negative result may occur with  improper specimen collection/handling, submission of specimen other than nasopharyngeal swab, presence of viral mutation(s) within the areas targeted by this assay, and inadequate number of viral copies(<138 copies/mL). A negative result must be combined with clinical observations, patient history, and epidemiological information. The expected result is Negative.  Fact Sheet for Patients:  bloggercourse.com  Fact Sheet for Healthcare Providers:  seriousbroker.it  This test is no t yet approved or cleared by the [place]  FDA and  has been authorized for detection and/or diagnosis of SARS-CoV-2 by FDA under an Emergency Use Authorization (EUA). This EUA will remain  in effect (meaning this test can be used) for the duration of the COVID-19 declaration under Section 564(b)(1) of the Act, 21 U.S.C.section 360bbb-3(b)(1), unless the authorization is terminated  or revoked sooner.       Influenza A by PCR NEGATIVE NEGATIVE Final   Influenza B by PCR NEGATIVE NEGATIVE Final    Comment: (NOTE) The Xpert Xpress SARS-CoV-2/FLU/RSV plus assay is intended as an aid in the diagnosis of influenza from Nasopharyngeal swab specimens and should not be used as a sole basis for treatment. Nasal washings and aspirates are unacceptable for Xpert Xpress SARS-CoV-2/FLU/RSV testing.  Fact Sheet for Patients: bloggercourse.com  Fact Sheet for Healthcare  Providers: seriousbroker.it  This test is not yet approved or cleared by the [place]  FDA and has been authorized for detection and/or diagnosis of SARS-CoV-2 by FDA under an Emergency Use Authorization (EUA). This EUA will remain in effect (meaning this test can be used) for the duration of the COVID-19 declaration under Section 564(b)(1) of the Act, 21 U.S.C. section 360bbb-3(b)(1), unless the authorization is terminated or revoked.     Resp Syncytial Virus by PCR NEGATIVE NEGATIVE Final    Comment: (NOTE) Fact Sheet for Patients: bloggercourse.com  Fact Sheet for Healthcare Providers: seriousbroker.it  This test is not yet approved or cleared by the [place]   FDA and has been authorized for detection and/or diagnosis of SARS-CoV-2 by FDA under an Emergency Use Authorization (EUA). This EUA will remain in effect (meaning this test can be used) for the duration of the COVID-19 declaration under Section 564(b)(1) of the Act, 21 U.S.C. section 360bbb-3(b)(1), unless the authorization is terminated or revoked.  Performed at [facility], [address], [place], [place] [address]       Studies: CT ABDOMEN PELVIS W CONTRAST Result Date: 09/11/2024 EXAM: CT ABDOMEN AND PELVIS WITH CONTRAST 09/11/2024 09:15:00 PM TECHNIQUE: CT of the abdomen and pelvis was performed with the administration of 100 mL of iohexol  (OMNIPAQUE ) 300 MG/ML solution. Multiplanar reformatted images are provided for review. Automated exposure control, iterative reconstruction, and/or weight-based adjustment of the mA/kV was utilized to reduce the radiation dose to as low as reasonably achievable. COMPARISON: CT abdomen and pelvis 09/07/2024. CLINICAL HISTORY: Bowel obstruction suspected. FINDINGS: LOWER CHEST: There is atelectasis in the bilateral lung bases. LIVER: The liver is unremarkable. GALLBLADDER AND  BILE DUCTS: Gallbladder is unremarkable. No biliary ductal dilatation. SPLEEN: No acute abnormality. PANCREAS: No acute abnormality. ADRENAL GLANDS: No acute abnormality. KIDNEYS, URETERS AND BLADDER: bilateral perinephric fat stranding has decreased in the interval. There is no hydronephrosis. There is bilateral renal cortical scarring similar to prior study. Subcentimeter rounded hypodensity in the inferior pole of the left kidney likely represents a cyst. Heterogeneous enhancement throughout both kidneys more prominent on the delayed images appears similar to prior study. No perinephric fluid collection. Ureters now appear normal. No stones in the kidneys or ureters. Urinary bladder is unremarkable. GI AND BOWEL: Stomach demonstrates no acute abnormality. There are scattered colonic diverticula. The appendix is normal. There is no bowel obstruction. PERITONEUM AND RETROPERITONEUM: No ascites. No free air. VASCULATURE: Aorta is normal in caliber. There are atherosclerotic calcifications of the aorta. LYMPH NODES: No lymphadenopathy. REPRODUCTIVE ORGANS: Heterogeneous mass in the region of the uterine fundus measures 4 cm and appears unchanged, likely fibroid . BONES AND SOFT TISSUES: No acute osseous abnormality. Right hip arthroplasty present. No focal soft tissue abnormality. IMPRESSION: 1. Patchy opacities throughout both kidneys and perinephric fat stranding treatment decrease in the interval. The ureters now appear normal. Findings are likely related to resolving infection. 2. No evidence for bowel obstruction. Electronically signed by: Greig Pique MD 09/11/2024 10:43 PM EST RP Workstation: [id number]    Scheduled Meds:  enoxaparin  (LOVENOX ) injection  40 mg Subcutaneous Q24H   sodium chloride  flush  3 mL Intravenous Q12H   sodium chloride  flush  3 mL Intravenous Q12H    Continuous Infusions:  sodium chloride      cefTRIAXone  (ROCEPHIN )  IV Stopped (09/12/24 1128)   lactated ringers  75 mL/hr at  09/12/24 0231     LOS: 0 days     Lebron JINNY Cage, MD [facility]  If 7PM-7AM, please contact night-coverage [email or web address] 09/12/2024, 5:24 PM    "

## 2024-09-13 DIAGNOSIS — F331 Major depressive disorder, recurrent, moderate: Secondary | ICD-10-CM | POA: Insufficient documentation

## 2024-09-13 DIAGNOSIS — N10 Acute tubulo-interstitial nephritis: Secondary | ICD-10-CM | POA: Diagnosis not present

## 2024-09-13 LAB — CBC
HCT: 30.1 % — ABNORMAL LOW (ref 36.0–46.0)
Hemoglobin: 10 g/dL — ABNORMAL LOW (ref 12.0–15.0)
MCH: 29.3 pg (ref 26.0–34.0)
MCHC: 33.2 g/dL (ref 30.0–36.0)
MCV: 88.3 fL (ref 80.0–100.0)
Platelets: 407 K/uL — ABNORMAL HIGH (ref 150–400)
RBC: 3.41 MIL/uL — ABNORMAL LOW (ref 3.87–5.11)
RDW: 13.3 % (ref 11.5–15.5)
WBC: 9.8 K/uL (ref 4.0–10.5)
nRBC: 0 % (ref 0.0–0.2)

## 2024-09-13 LAB — BASIC METABOLIC PANEL WITH GFR
Anion gap: 12 (ref 5–15)
BUN: 8 mg/dL (ref 8–23)
CO2: 24 mmol/L (ref 22–32)
Calcium: 8.8 mg/dL — ABNORMAL LOW (ref 8.9–10.3)
Chloride: 105 mmol/L (ref 98–111)
Creatinine, Ser: 1.1 mg/dL — ABNORMAL HIGH (ref 0.44–1.00)
GFR, Estimated: 57 mL/min — ABNORMAL LOW
Glucose, Bld: 109 mg/dL — ABNORMAL HIGH (ref 70–99)
Potassium: 3.4 mmol/L — ABNORMAL LOW (ref 3.5–5.1)
Sodium: 140 mmol/L (ref 135–145)

## 2024-09-13 LAB — MAGNESIUM: Magnesium: 2.2 mg/dL (ref 1.7–2.4)

## 2024-09-13 LAB — URINE CULTURE: Culture: NO GROWTH

## 2024-09-13 MED ORDER — POTASSIUM CHLORIDE CRYS ER 20 MEQ PO TBCR
40.0000 meq | EXTENDED_RELEASE_TABLET | Freq: Two times a day (BID) | ORAL | Status: AC
  Administered 2024-09-13 (×2): 40 meq via ORAL
  Filled 2024-09-13 (×2): qty 2

## 2024-09-13 NOTE — Plan of Care (Signed)

## 2024-09-13 NOTE — Progress Notes (Signed)
 "  PROGRESS NOTE  Stacy [name] FMW:[id number] DOB: 02-03-1961 DOA: 09/11/2024 PCP: Antonette Angeline ORN, NP  HPI/Recap of past 24 hours: Stacy [name] is a 63 y.o. female with medical history significant of essential hypertension, gout, fibromyalgia and chronic normocytic anemia presented to the ED, complaining of abdominal pain, intractable nausea/vomiting, with poor oral intake and chills since discharge.  Patient unable to tolerate oral antibiotics prescribed to her prior to discharge on 12/24.  Of note, patient was admitted for acute pyelonephritis, treated with IV Zosyn  and transition to complete 10 days of ciprofloxacin .  Due to the Christmas holiday, patient requested to discharge home as she wanted to spend Christmas with family.  In the ED, vital signs fairly stable, WBC 12.7, UA showed small leukocytes, rare bacteria, 0-5 WBC.  CT A/P showed patchy opacities throughout both kidneys with perinephric fat stranding, which all appear to be resolving.  No noted bowel obstruction.  Patient admitted for further management.       Today, patient still complaining of some abdominal discomfort, otherwise nausea/vomiting has improved       Assessment/Plan: Principal Problem:   Acute pyelonephritis Active Problems:   Essential hypertension   Fibromyalgia   Anxiety and depression   Gout   Normocytic anemia   Intractable nausea and vomiting   Acute pyelonephritis Intractable nausea/vomiting-improving Currently afebrile, with resolved leukocytosis UA showed small leukocytes, rare bacteria, 0-5 WBC Previous urine culture grew 80,000 E. Coli, repeat with no growth CT A/P showed patchy opacities throughout both kidneys with perinephric fat stranding, which all appear to be resolving.  No noted bowel obstruction Continue IV ceftriaxone , antiemetics Monitor closely  AKI Improving S/p IV fluid Daily BMP  Hypokalemia Replace as needed   Normocytic anemia Hemoglobin stable at  baseline Anemia panel pending   Essential hypertension BP stable Hold PTA hydrochlorothiazide  and losartan in the setting of AKI  Continue IV hydralazine  as needed.   Obesity class II Lifestyle modification advised    Estimated body mass index is 38.09 kg/m as calculated from the following:   Height as of this encounter: 5' 3 (1.6 m).   Weight as of this encounter: 97.5 kg.     Code Status: Full  Family Communication: None at bedside  Disposition Plan: Status is: Inpatient Remains inpatient appropriate because: Level of care      Consultants: None  Procedures: None  Antimicrobials: IV ceftriaxone   DVT prophylaxis: Lovenox    Objective: Vitals:   09/13/24 0313 09/13/24 0800 09/13/24 1200 09/13/24 1552  BP: 121/67 130/62  114/79  Pulse: 71 77  72  Resp: (!) 22   18  Temp: 97.6 F (36.4 C) 98 F (36.7 C) 97.9 F (36.6 C) 98.3 F (36.8 C)  TempSrc: Axillary Oral Oral   SpO2: 94% 100%  96%  Weight:      Height:        Intake/Output Summary (Last 24 hours) at 09/13/2024 1608 Last data filed at 09/13/2024 0100 Gross per 24 hour  Intake 1021.04 ml  Output --  Net 1021.04 ml   Filed Weights   09/11/24 1709  Weight: 97.5 kg    Exam: General: NAD  Cardiovascular: S1, S2 present Respiratory: CTAB Abdomen: Soft, tender, nondistended, bowel sounds present Musculoskeletal: No bilateral pedal edema noted Skin: Normal Psychiatry: Normal mood     Data Reviewed: CBC: Recent Labs  Lab 09/08/24 0442 09/09/24 0447 09/11/24 1746 09/12/24 0440 09/13/24 0647  WBC 18.6* 13.3* 12.7* 12.4* 9.8  HGB 12.1 10.3* 10.6*  10.6* 10.0*  HCT 34.6* 29.4* 31.7* 32.0* 30.1*  MCV 85.4 84.7 88.5 88.6 88.3  PLT 201 193 376 424* 407*   Basic Metabolic Panel: Recent Labs  Lab 09/08/24 0442 09/08/24 1454 09/09/24 0447 09/11/24 1746 09/12/24 0440 09/13/24 0647 09/13/24 0653  NA 127* 126* 128* 134* 137 140  --   K 3.4* 4.3 3.5 3.6 3.8 3.4*  --   CL 93* 92*  94* 99 101 105  --   CO2 20* 23 20* 22 24 24   --   GLUCOSE 108* 127* 108* 92 106* 109*  --   BUN 29* 25* 21* 13 11 8   --   CREATININE 1.77* 1.77* 1.60* 1.13* 1.19* 1.10*  --   CALCIUM 8.1* 8.2* 8.1* 8.9 8.5* 8.8*  --   MG 2.3  --  2.3  --   --   --  2.2   GFR: Estimated Creatinine Clearance: 59.7 mL/min (A) (by C-G formula based on SCr of 1.1 mg/dL (H)). Liver Function Tests: Recent Labs  Lab 09/07/24 1136 09/11/24 1746  AST 23 28  ALT 14 22  ALKPHOS 83 93  BILITOT 1.4* 0.7  PROT 7.8 7.0  ALBUMIN  3.8 3.4*   Recent Labs  Lab 09/07/24 1136 09/11/24 1746  LIPASE 17 38   No results for input(s): AMMONIA in the last 168 hours. Coagulation Profile: No results for input(s): INR, PROTIME in the last 168 hours. Cardiac Enzymes: No results for input(s): CKTOTAL, CKMB, CKMBINDEX, TROPONINI in the last 168 hours. BNP (last 3 results) No results for input(s): PROBNP in the last 8760 hours. HbA1C: No results for input(s): HGBA1C in the last 72 hours. CBG: No results for input(s): GLUCAP in the last 168 hours. Lipid Profile: No results for input(s): CHOL, HDL, LDLCALC, TRIG, CHOLHDL, LDLDIRECT in the last 72 hours. Thyroid  Function Tests: No results for input(s): TSH, T4TOTAL, FREET4, T3FREE, THYROIDAB in the last 72 hours. Anemia Panel: No results for input(s): VITAMINB12, FOLATE, FERRITIN, TIBC, IRON, RETICCTPCT in the last 72 hours. Urine analysis:    Component Value Date/Time   COLORURINE STRAW (A) 09/11/2024 1746   APPEARANCEUR CLEAR 09/11/2024 1746   LABSPEC 1.002 (L) 09/11/2024 1746   LABSPEC 1.005 11/07/2016 1048   PHURINE 6.0 09/11/2024 1746   GLUCOSEU NEGATIVE 09/11/2024 1746   GLUCOSEU Negative 11/07/2016 1048   HGBUR SMALL (A) 09/11/2024 1746   BILIRUBINUR NEGATIVE 09/11/2024 1746   BILIRUBINUR Negative 11/07/2016 1048   KETONESUR NEGATIVE 09/11/2024 1746   PROTEINUR NEGATIVE 09/11/2024 1746   UROBILINOGEN  0.2 11/07/2016 1048   NITRITE NEGATIVE 09/11/2024 1746   LEUKOCYTESUR SMALL (A) 09/11/2024 1746   LEUKOCYTESUR Negative 11/07/2016 1048   Sepsis Labs: @LABRCNTIP (procalcitonin:4,lacticidven:4)  ) Recent Results (from the past 240 hours)  Urine Culture     Status: Abnormal   Collection Time: 09/07/24  2:49 PM   Specimen: Urine, Clean Catch  Result Value Ref Range Status   Specimen Description URINE, CLEAN CATCH  Final   Special Requests NONE  Final   Culture 80,000 COLONIES/mL ESCHERICHIA COLI (A)  Final   Report Status 09/09/2024 FINAL  Final   Organism ID, Bacteria ESCHERICHIA COLI (A)  Final      Susceptibility   Escherichia coli - MIC*    AMPICILLIN >=32 RESISTANT Resistant     CEFAZOLIN  (URINE) Value in next row Sensitive      4 SENSITIVEThis is a modified FDA-approved test that has been validated and its performance characteristics determined by the reporting laboratory.  This  laboratory is certified under the Clinical Laboratory Improvement Amendments CLIA as qualified to perform high complexity clinical laboratory testing.    CEFEPIME Value in next row Sensitive      4 SENSITIVEThis is a modified FDA-approved test that has been validated and its performance characteristics determined by the reporting laboratory.  This laboratory is certified under the Clinical Laboratory Improvement Amendments CLIA as qualified to perform high complexity clinical laboratory testing.    ERTAPENEM Value in next row Sensitive      4 SENSITIVEThis is a modified FDA-approved test that has been validated and its performance characteristics determined by the reporting laboratory.  This laboratory is certified under the Clinical Laboratory Improvement Amendments CLIA as qualified to perform high complexity clinical laboratory testing.    CEFTRIAXONE  Value in next row Sensitive      4 SENSITIVEThis is a modified FDA-approved test that has been validated and its performance characteristics determined by the  reporting laboratory.  This laboratory is certified under the Clinical Laboratory Improvement Amendments CLIA as qualified to perform high complexity clinical laboratory testing.    CIPROFLOXACIN  Value in next row Sensitive      4 SENSITIVEThis is a modified FDA-approved test that has been validated and its performance characteristics determined by the reporting laboratory.  This laboratory is certified under the Clinical Laboratory Improvement Amendments CLIA as qualified to perform high complexity clinical laboratory testing.    GENTAMICIN Value in next row Sensitive      4 SENSITIVEThis is a modified FDA-approved test that has been validated and its performance characteristics determined by the reporting laboratory.  This laboratory is certified under the Clinical Laboratory Improvement Amendments CLIA as qualified to perform high complexity clinical laboratory testing.    NITROFURANTOIN Value in next row Sensitive      4 SENSITIVEThis is a modified FDA-approved test that has been validated and its performance characteristics determined by the reporting laboratory.  This laboratory is certified under the Clinical Laboratory Improvement Amendments CLIA as qualified to perform high complexity clinical laboratory testing.    TRIMETH/SULFA Value in next row Sensitive      4 SENSITIVEThis is a modified FDA-approved test that has been validated and its performance characteristics determined by the reporting laboratory.  This laboratory is certified under the Clinical Laboratory Improvement Amendments CLIA as qualified to perform high complexity clinical laboratory testing.    AMPICILLIN/SULBACTAM Value in next row Intermediate      4 SENSITIVEThis is a modified FDA-approved test that has been validated and its performance characteristics determined by the reporting laboratory.  This laboratory is certified under the Clinical Laboratory Improvement Amendments CLIA as qualified to perform high complexity  clinical laboratory testing.    PIP/TAZO Value in next row Sensitive      <=4 SENSITIVEThis is a modified FDA-approved test that has been validated and its performance characteristics determined by the reporting laboratory.  This laboratory is certified under the Clinical Laboratory Improvement Amendments CLIA as qualified to perform high complexity clinical laboratory testing.    MEROPENEM Value in next row Sensitive      <=4 SENSITIVEThis is a modified FDA-approved test that has been validated and its performance characteristics determined by the reporting laboratory.  This laboratory is certified under the Clinical Laboratory Improvement Amendments CLIA as qualified to perform high complexity clinical laboratory testing.    * 80,000 COLONIES/mL ESCHERICHIA COLI  Culture, blood (Routine X 2) w Reflex to ID Panel     Status: None   Collection Time:  09/07/24  6:26 PM   Specimen: BLOOD  Result Value Ref Range Status   Specimen Description BLOOD RIGHT ANTECUBITAL  Final   Special Requests   Final    BOTTLES DRAWN AEROBIC AND ANAEROBIC Blood Culture adequate volume   Culture   Final    NO GROWTH 5 DAYS Performed at [facility], [address] [place], [place] [address]    Report Status 09/12/2024 FINAL  Final  Culture, blood (Routine X 2) w Reflex to ID Panel     Status: None   Collection Time: 09/07/24  6:26 PM   Specimen: BLOOD  Result Value Ref Range Status   Specimen Description BLOOD BLOOD LEFT FOREARM  Final   Special Requests   Final    BOTTLES DRAWN AEROBIC AND ANAEROBIC Blood Culture results may not be optimal due to an inadequate volume of blood received in culture bottles   Culture   Final    NO GROWTH 5 DAYS Performed at [facility], [address] [place], [place] [address]    Report Status 09/12/2024 FINAL  Final  Urine Culture (for pregnant, neutropenic or urologic patients or patients with an indwelling urinary catheter)     Status: None    Collection Time: 09/11/24  5:46 PM   Specimen: Urine, Clean Catch  Result Value Ref Range Status   Specimen Description   Final    URINE, CLEAN CATCH Performed at [facility], [address] [place], [place] [address]    Special Requests   Final    NONE Performed at [facility], [address], [place], [place] [address]    Culture   Final    NO GROWTH Performed at [facility], [address], [place], [place] [address]    Report Status 09/13/2024 FINAL  Final  Resp panel by RT-PCR (RSV, Flu A&B, Covid) Anterior Nasal Swab     Status: None   Collection Time: 09/12/24  1:43 AM   Specimen: Anterior Nasal Swab  Result Value Ref Range Status   SARS Coronavirus 2 by RT PCR NEGATIVE NEGATIVE Final    Comment: (NOTE) SARS-CoV-2 target nucleic acids are NOT DETECTED.  The SARS-CoV-2 RNA is generally detectable in upper respiratory specimens during the acute phase of infection. The lowest concentration of SARS-CoV-2 viral copies this assay can detect is 138 copies/mL. A negative result does not preclude SARS-Cov-2 infection and should not be used as the sole basis for treatment or other patient management decisions. A negative result may occur with  improper specimen collection/handling, submission of specimen other than nasopharyngeal swab, presence of viral mutation(s) within the areas targeted by this assay, and inadequate number of viral copies(<138 copies/mL). A negative result must be combined with clinical observations, patient history, and epidemiological information. The expected result is Negative.  Fact Sheet for Patients:  bloggercourse.com  Fact Sheet for Healthcare Providers:  seriousbroker.it  This test is no t yet approved or cleared by the [place]  FDA and  has been authorized for detection and/or diagnosis of SARS-CoV-2 by FDA under an Emergency Use Authorization  (EUA). This EUA will remain  in effect (meaning this test can be used) for the duration of the COVID-19 declaration under Section 564(b)(1) of the Act, 21 U.S.C.section 360bbb-3(b)(1), unless the authorization is terminated  or revoked sooner.       Influenza A by PCR NEGATIVE NEGATIVE Final   Influenza B by PCR NEGATIVE NEGATIVE Final    Comment: (  NOTE) The Xpert Xpress SARS-CoV-2/FLU/RSV plus assay is intended as an aid in the diagnosis of influenza from Nasopharyngeal swab specimens and should not be used as a sole basis for treatment. Nasal washings and aspirates are unacceptable for Xpert Xpress SARS-CoV-2/FLU/RSV testing.  Fact Sheet for Patients: bloggercourse.com  Fact Sheet for Healthcare Providers: seriousbroker.it  This test is not yet approved or cleared by the [place]  FDA and has been authorized for detection and/or diagnosis of SARS-CoV-2 by FDA under an Emergency Use Authorization (EUA). This EUA will remain in effect (meaning this test can be used) for the duration of the COVID-19 declaration under Section 564(b)(1) of the Act, 21 U.S.C. section 360bbb-3(b)(1), unless the authorization is terminated or revoked.     Resp Syncytial Virus by PCR NEGATIVE NEGATIVE Final    Comment: (NOTE) Fact Sheet for Patients: bloggercourse.com  Fact Sheet for Healthcare Providers: seriousbroker.it  This test is not yet approved or cleared by the [place]  FDA and has been authorized for detection and/or diagnosis of SARS-CoV-2 by FDA under an Emergency Use Authorization (EUA). This EUA will remain in effect (meaning this test can be used) for the duration of the COVID-19 declaration under Section 564(b)(1) of the Act, 21 U.S.C. section 360bbb-3(b)(1), unless the authorization is terminated or revoked.  Performed at [facility], [address], [place], [place] [address]       Studies: No results found.   Scheduled Meds:  enoxaparin  (LOVENOX ) injection  40 mg Subcutaneous Q24H   feeding supplement  237 mL Oral BID BM   potassium chloride   40 mEq Oral BID   sodium chloride  flush  3 mL Intravenous Q12H   sodium chloride  flush  3 mL Intravenous Q12H    Continuous Infusions:  cefTRIAXone  (ROCEPHIN )  IV 2 g (09/13/24 0931)     LOS: 1 day     Lebron JINNY Cage, MD [facility]  If 7PM-7AM, please contact night-coverage [email or web address] 09/13/2024, 4:08 PM    "
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
## 2024-09-14 ENCOUNTER — Other Ambulatory Visit (HOSPITAL_COMMUNITY): Payer: Self-pay

## 2024-09-14 ENCOUNTER — Encounter: Admitting: Internal Medicine

## 2024-09-14 DIAGNOSIS — N10 Acute tubulo-interstitial nephritis: Secondary | ICD-10-CM | POA: Diagnosis not present

## 2024-09-14 DIAGNOSIS — F331 Major depressive disorder, recurrent, moderate: Secondary | ICD-10-CM

## 2024-09-14 LAB — BASIC METABOLIC PANEL WITH GFR
Anion gap: 13 (ref 5–15)
BUN: 6 mg/dL — ABNORMAL LOW (ref 8–23)
CO2: 22 mmol/L (ref 22–32)
Calcium: 8.4 mg/dL — ABNORMAL LOW (ref 8.9–10.3)
Chloride: 105 mmol/L (ref 98–111)
Creatinine, Ser: 1.07 mg/dL — ABNORMAL HIGH (ref 0.44–1.00)
GFR, Estimated: 59 mL/min — ABNORMAL LOW
Glucose, Bld: 111 mg/dL — ABNORMAL HIGH (ref 70–99)
Potassium: 3.9 mmol/L (ref 3.5–5.1)
Sodium: 140 mmol/L (ref 135–145)

## 2024-09-14 LAB — IRON AND TIBC
Iron: 37 ug/dL (ref 28–170)
Saturation Ratios: 15 % (ref 10.4–31.8)
TIBC: 251 ug/dL (ref 250–450)
UIBC: 214 ug/dL

## 2024-09-14 LAB — VITAMIN B12: Vitamin B-12: 434 pg/mL (ref 180–914)

## 2024-09-14 LAB — FERRITIN: Ferritin: 222 ng/mL (ref 11–307)

## 2024-09-14 LAB — FOLATE: Folate: 7.2 ng/mL

## 2024-09-14 MED ORDER — SULFAMETHOXAZOLE-TRIMETHOPRIM 800-160 MG PO TABS
1.0000 | ORAL_TABLET | Freq: Two times a day (BID) | ORAL | 0 refills | Status: DC
  Filled 2024-09-14: qty 8, 4d supply, fill #0

## 2024-09-14 NOTE — Progress Notes (Signed)
Discharge instructions given to patient no questions at this time.

## 2024-09-14 NOTE — TOC Initial Note (Signed)
 Transition of Care [facility]) - Initial/Assessment Note    Patient Details  Name: Stacy [name] MRN: [id number] Date of Birth: 1961-09-17  [facility] CM/SW Contact:    Doneta Glenys DASEN, RN Phone Number: 09/14/2024, 12:24 PM  Clinical Narrative:                 CM introduce and explained role. PTA expressed SDOH needs. When speaking with patient she determined that she is aware of resources and politely refused additional information. IP CM signing off.  Expected Discharge Plan: Home/Self Care Barriers to Discharge: Barriers Resolved   Patient Goals and CMS Choice Patient states their goals for this hospitalization and ongoing recovery are:: Home   Choice offered to / list presented to : NA Derma ownership interest in [facility].provided to:: Parent NA    Expected Discharge Plan and Services In-house Referral: NA Discharge Planning Services: CM Consult Post Acute Care Choice: NA Living arrangements for the past 2 months: Single Family Home Expected Discharge Date: 09/14/24               DME Arranged: N/A DME Agency: NA       HH Arranged: NA HH Agency: NA        Prior Living Arrangements/Services Living arrangements for the past 2 months: Single Family Home Lives with:: Self Patient language and need for interpreter reviewed:: Yes Do you feel safe going back to the place where you live?: Yes      Need for Family Participation in Patient Care: No (Comment) Care giver support system in place?: Yes (comment) Current home services:  (NA) Criminal Activity/Legal Involvement Pertinent to Current Situation/Hospitalization: No - Comment as needed  Activities of Daily Living   ADL Screening (condition at time of admission) Independently performs ADLs?: Yes (appropriate for developmental age) Does the patient have a NEW difficulty with bathing/dressing/toileting/self-feeding that is expected to last >3 days?: No Does the patient have a NEW  difficulty with getting in/out of bed, walking, or climbing stairs that is expected to last >3 days?: No Does the patient have a NEW difficulty with communication that is expected to last >3 days?: No Is the patient deaf or have difficulty hearing?: No Does the patient have difficulty seeing, even when wearing glasses/contacts?: No Does the patient have difficulty concentrating, remembering, or making decisions?: No  Permission Sought/Granted Permission sought to share information with : Case Manager Permission granted to share information with : Yes, Verbal Permission Granted  Share Information with NAME: Merlene, Dante, Emergency Contact  [phone]           Emotional Assessment Appearance:: Appears stated age Attitude/Demeanor/Rapport: Engaged, Gracious Affect (typically observed): Appropriate Orientation: : Oriented to Self, Oriented to Place, Oriented to  Time, Oriented to Situation Alcohol / Substance Use: Not Applicable Psych Involvement: No (comment)  Admission diagnosis:  Pyelonephritis [N12] Acute pyelonephritis [N10] Nausea and vomiting, unspecified vomiting type [R11.2] Patient Active Problem List   Diagnosis Date Noted   MDD (major depressive disorder), recurrent episode, moderate (HCC) 09/13/2024   Gout 09/11/2024   Normocytic anemia 09/11/2024   HLD (hyperlipidemia) 04/13/2021   Prediabetes 04/13/2021   Class 3 severe obesity due to excess calories with body mass index (BMI) of 40.0 to 44.9 in adult [facility]) 04/13/2021   Port catheter in place 10/09/2016   GERD (gastroesophageal reflux disease) 10/09/2016   History of left breast cancer 08/23/2016   GAD (generalized anxiety disorder) 04/20/2016   Arthritis 06/27/2015  Essential hypertension 04/19/2009   Fibromyalgia 02/18/2008   PCP:  Antonette Angeline ORN, NP Pharmacy:   [facility] [address] [place] - [address] [place] [address] Phone: [phone] Fax:  [phone]     Social Drivers of Health (SDOH) Social History: SDOH Screenings   Food Insecurity: Food Insecurity Present (09/12/2024)  Housing: Low Risk (09/12/2024)  Transportation Needs: No Transportation Needs (09/12/2024)  Utilities: At Risk (09/12/2024)  Alcohol Screen: Low Risk (07/18/2023)  Depression (PHQ2-9): High Risk (01/14/2024)  Financial Resource Strain: Medium Risk (01/09/2024)  Physical Activity: Insufficiently Active (01/09/2024)  Social Connections: Socially Isolated (09/12/2024)  Stress: Stress Concern Present (01/09/2024)  Tobacco Use: High Risk (09/12/2024)   SDOH Interventions:     Readmission Risk Interventions    09/14/2024   12:20 PM  Readmission Risk Prevention Plan  Transportation Screening Complete  PCP or Specialist Appt within 5-7 Days Complete  Home Care Screening Complete  Medication Review (RN CM) Complete

## 2024-09-14 NOTE — Discharge Summary (Signed)
 " Physician Discharge Summary   Patient: Stacy [name] MRN: [id number] DOB: 20-Dec-1960  Admit date:     09/11/2024  Discharge date: 09/14/2024  Discharge Physician: Lebron JINNY Cage   PCP: Antonette Angeline ORN, NP   Recommendations at discharge:   Follow-up with PCP in 1 week  Discharge Diagnoses: Active Problems:   Essential hypertension   Fibromyalgia   GAD (generalized anxiety disorder)   Gout   Normocytic anemia    Hospital Course: Stacy [name] is a 63 y.o. female with medical history significant of essential hypertension, gout, fibromyalgia and chronic normocytic anemia presented to the ED, complaining of abdominal pain, intractable nausea/vomiting, with poor oral intake and chills since discharge.  Patient unable to tolerate oral antibiotics prescribed to her prior to discharge on 12/24.  Of note, patient was admitted for acute pyelonephritis, treated with IV Zosyn  and transition to complete 10 days of ciprofloxacin .  Due to the Christmas holiday, patient requested to discharge home as she wanted to spend Christmas with family.  In the ED, vital signs fairly stable, WBC 12.7, UA showed small leukocytes, rare bacteria, 0-5 WBC.  CT A/P showed patchy opacities throughout both kidneys with perinephric fat stranding, which all appear to be resolving.  No noted bowel obstruction.  Patient admitted for further management.     Today, patient denies any new complaints.  Patient denies any further nausea/vomiting, fever/chills, chest pain, SOB.  Follow-up PCP in 1 week with repeat labs   Assessment and Plan: Acute pyelonephritis Intractable nausea/vomiting-improving Currently afebrile, with resolved leukocytosis UA showed small leukocytes, rare bacteria, 0-5 WBC Previous urine culture grew 80,000 E. Coli, repeat with no growth CT A/P showed patchy opacities throughout both kidneys with perinephric fat stranding, which all appear to be resolving.  No noted bowel  obstruction S/p IV ceftriaxone , discussed with ID, Dr. Overton, discharge patient on p.o. Bactrim to complete 7 days of antibiotic Follow-up with PCP   AKI Improved S/p IV fluid Follow-up with PCP with repeat labs   Hypokalemia Replaced as needed   Normocytic anemia Hemoglobin stable at baseline Anemia panel WNL   Essential hypertension BP stable Resume hydrochlorothiazide , as BP has stabilized and creatinine is almost back to baseline   Obesity class II Lifestyle modification advised         Consultants: None Procedures performed: None Disposition: Home Diet recommendation:  Cardiac diet   DISCHARGE MEDICATION: Allergies as of 09/14/2024       Reactions   Gabapentin Swelling   makes me feel detached   Naproxen Palpitations   Neulasta  [pegfilgrastim ] Itching, Other (See Comments)   Patient stated,reddish skin tone, body on fire, tingly tongue.   Erythromycin Nausea And Vomiting   Gabapentin Other (See Comments)   Makes me feel out of my body   Neulasta  [pegfilgrastim ] Itching   Tramadol Other (See Comments)   headache   Erythromycin Nausea And Vomiting   Naproxen Palpitations   Tramadol Hcl Other (See Comments)   Headaches        Medication List     PAUSE taking these medications    Wegovy 1 MG/0.5ML Soaj SQ injection Wait to take this until your doctor or other care provider tells you to start again. Generic drug: semaglutide-weight management       STOP taking these medications    ciprofloxacin  500 MG tablet Commonly known as: CIPRO    olmesartan  20 MG tablet Commonly known as: BENICAR    promethazine -dextromethorphan 6.25-15 MG/5ML syrup Commonly known as: PROMETHAZINE -DM  TAKE these medications    acetaminophen  325 MG tablet Commonly known as: TYLENOL  Take 2 tablets (650 mg total) by mouth every 6 (six) hours as needed for mild pain (pain score 1-3), headache or fever.   hydrochlorothiazide  25 MG tablet Commonly known  as: HYDRODIURIL  TAKE 1 TABLET (25 MG TOTAL) BY MOUTH DAILY.   oxyCODONE  5 MG immediate release tablet Commonly known as: Oxy IR/ROXICODONE  Take 0.5-1 tablets (2.5-5 mg total) by mouth every 4 (four) hours as needed for moderate pain (pain score 4-6) or severe pain (pain score 7-10).   prochlorperazine  10 MG tablet Commonly known as: COMPAZINE  Take 0.5 tablets (5 mg total) by mouth every 6 (six) hours as needed for nausea or vomiting.   sulfamethoxazole-trimethoprim 800-160 MG tablet Commonly known as: Bactrim DS Take 1 tablet by mouth 2 (two) times daily for 4 days. Start taking on: September 15, 2024        Discharge Exam: Fredricka Weights   09/11/24 1709  Weight: 97.5 kg   General: NAD  Cardiovascular: S1, S2 present Respiratory: CTAB Abdomen: Soft, nontender, nondistended, bowel sounds present Musculoskeletal: No bilateral pedal edema noted Skin: Normal Psychiatry: Normal mood   Condition at discharge: stable  The results of significant diagnostics from this hospitalization (including imaging, microbiology, ancillary and laboratory) are listed below for reference.   Imaging Studies: CT ABDOMEN PELVIS W CONTRAST Result Date: 09/11/2024 EXAM: CT ABDOMEN AND PELVIS WITH CONTRAST 09/11/2024 09:15:00 PM TECHNIQUE: CT of the abdomen and pelvis was performed with the administration of 100 mL of iohexol  (OMNIPAQUE ) 300 MG/ML solution. Multiplanar reformatted images are provided for review. Automated exposure control, iterative reconstruction, and/or weight-based adjustment of the mA/kV was utilized to reduce the radiation dose to as low as reasonably achievable. COMPARISON: CT abdomen and pelvis 09/07/2024. CLINICAL HISTORY: Bowel obstruction suspected. FINDINGS: LOWER CHEST: There is atelectasis in the bilateral lung bases. LIVER: The liver is unremarkable. GALLBLADDER AND BILE DUCTS: Gallbladder is unremarkable. No biliary ductal dilatation. SPLEEN: No acute abnormality. PANCREAS: No  acute abnormality. ADRENAL GLANDS: No acute abnormality. KIDNEYS, URETERS AND BLADDER: bilateral perinephric fat stranding has decreased in the interval. There is no hydronephrosis. There is bilateral renal cortical scarring similar to prior study. Subcentimeter rounded hypodensity in the inferior pole of the left kidney likely represents a cyst. Heterogeneous enhancement throughout both kidneys more prominent on the delayed images appears similar to prior study. No perinephric fluid collection. Ureters now appear normal. No stones in the kidneys or ureters. Urinary bladder is unremarkable. GI AND BOWEL: Stomach demonstrates no acute abnormality. There are scattered colonic diverticula. The appendix is normal. There is no bowel obstruction. PERITONEUM AND RETROPERITONEUM: No ascites. No free air. VASCULATURE: Aorta is normal in caliber. There are atherosclerotic calcifications of the aorta. LYMPH NODES: No lymphadenopathy. REPRODUCTIVE ORGANS: Heterogeneous mass in the region of the uterine fundus measures 4 cm and appears unchanged, likely fibroid . BONES AND SOFT TISSUES: No acute osseous abnormality. Right hip arthroplasty present. No focal soft tissue abnormality. IMPRESSION: 1. Patchy opacities throughout both kidneys and perinephric fat stranding treatment decrease in the interval. The ureters now appear normal. Findings are likely related to resolving infection. 2. No evidence for bowel obstruction. Electronically signed by: Greig Pique MD 09/11/2024 10:43 PM EST RP Workstation: [id number]   CT ABDOMEN PELVIS W CONTRAST Result Date: 09/07/2024 CLINICAL DATA:  Acute abdominal pain. EXAM: CT ABDOMEN AND PELVIS WITH CONTRAST TECHNIQUE: Multidetector CT imaging of the abdomen and pelvis was performed using the standard protocol following  bolus administration of intravenous contrast. RADIATION DOSE REDUCTION: This exam was performed according to the departmental dose-optimization program which includes  automated exposure control, adjustment of the mA and/or kV according to patient size and/or use of iterative reconstruction technique. CONTRAST:  80mL OMNIPAQUE  IOHEXOL  300 MG/ML  SOLN COMPARISON:  03/11/2017 FINDINGS: Lower chest: Subsegmental atelectasis in the dependent right and left lower lobe. No pleural fluid. Hepatobiliary: Few scattered tiny hypodensities in liver are too small to characterize, but of doubtful clinical significance. These are unchanged from prior exam. Gallbladder physiologically distended, no calcified stone. No biliary dilatation. Pancreas: No ductal dilatation or inflammation. Spleen: Normal in size without focal abnormality. Adrenals/Urinary Tract: Bilateral adrenal thickening without nodule. Lobulated renal contours with areas of cortical scarring, more so in the right kidney. Heterogeneous bilateral renal enhancement. Bilateral perinephric edema is more prominent on the left. Question of ureteral thickening bilaterally. No hydronephrosis. No renal calculi. Unremarkable urinary bladder. Stomach/Bowel: No bowel obstruction or inflammation. The appendix is normal. Tiny hiatal hernia. Small volume of formed stool in the colon. Sigmoid diverticulosis without diverticulitis. Vascular/Lymphatic: Aortic atherosclerosis. No aortic aneurysm. Circumaortic left renal vein. Patent portal vein. Enlarged right inguinal lymph node measures 15 mm short axis. There are small adjacent prominent right inguinal nodes. No other enlarged lymph nodes in the abdomen or pelvis. Reproductive: 3.6 cm fundal fibroid in the uterus.  No adnexal mass. Other: No free air or ascites.  Tiny fat containing umbilical hernia Musculoskeletal: Right hip arthroplasty. Degenerative change throughout the spine. IMPRESSION: 1. Heterogeneous bilateral renal enhancement with perinephric edema, more prominent on the left. Question of ureteral thickening bilaterally. Findings are suspicious for urinary tract infection. Recommend  correlation with urinalysis. 2. Enlarged right inguinal lymph node measuring 15 mm short axis. This is nonspecific and may be reactive. Recommend correlation for any infectious or inflammatory process in the right lower extremity. Consider pelvic CT follow-up in 3 months. 3. Sigmoid diverticulosis without diverticulitis. 4. Uterine fibroid. Aortic Atherosclerosis (ICD10-I70.0). Electronically Signed   By: Andrea Gasman M.D.   On: 09/07/2024 17:29   MM 3D DIAGNOSTIC MAMMOGRAM BILATERAL BREAST Result Date: 08/24/2024 CLINICAL DATA:  History of LEFT breast cancer with lumpectomy in 2017. EXAM: DIGITAL DIAGNOSTIC BILATERAL MAMMOGRAM WITH TOMOSYNTHESIS AND CAD TECHNIQUE: Bilateral digital diagnostic mammography and breast tomosynthesis was performed. The images were evaluated with computer-aided detection. COMPARISON:  Previous exam(s). ACR Breast Density Category b: There are scattered areas of fibroglandular density. FINDINGS: There is density and architectural distortion within the LEFT breast, consistent with postsurgical changes. These are stable in comparison to prior. No suspicious mass, distortion, or microcalcifications are identified to suggest presence of malignancy. IMPRESSION: No mammographic evidence of malignancy bilaterally. RECOMMENDATION: Per protocol, as the patient is now 2 or more years status post lumpectomy, she may return to annual screening mammography in 1 year. However, given the history of breast cancer, the patient remains eligible for annual diagnostic mammography if preferred. (Code:[id number]) I have discussed the findings and recommendations with the patient. If applicable, a reminder letter will be sent to the patient regarding the next appointment. BI-RADS CATEGORY  2: Benign. Electronically Signed   By: Corean Salter M.D.   On: 08/24/2024 10:50    Microbiology: Results for orders placed or performed during the hospital encounter of 09/11/24  Urine Culture (for pregnant,  neutropenic or urologic patients or patients with an indwelling urinary catheter)     Status: None   Collection Time: 09/11/24  5:46 PM   Specimen: Urine, Clean Catch  Result Value Ref Range Status   Specimen Description   Final    URINE, CLEAN CATCH Performed at [facility], [address], [place], [place] [address]    Special Requests   Final    NONE Performed at [facility], [address], [place], [place] [address]    Culture   Final    NO GROWTH Performed at [facility], [address], [place], [place] [address]    Report Status 09/13/2024 FINAL  Final  Resp panel by RT-PCR (RSV, Flu A&B, Covid) Anterior Nasal Swab     Status: None   Collection Time: 09/12/24  1:43 AM   Specimen: Anterior Nasal Swab  Result Value Ref Range Status   SARS Coronavirus 2 by RT PCR NEGATIVE NEGATIVE Final    Comment: (NOTE) SARS-CoV-2 target nucleic acids are NOT DETECTED.  The SARS-CoV-2 RNA is generally detectable in upper respiratory specimens during the acute phase of infection. The lowest concentration of SARS-CoV-2 viral copies this assay can detect is 138 copies/mL. A negative result does not preclude SARS-Cov-2 infection and should not be used as the sole basis for treatment or other patient management decisions. A negative result may occur with  improper specimen collection/handling, submission of specimen other than nasopharyngeal swab, presence of viral mutation(s) within the areas targeted by this assay, and inadequate number of viral copies(<138 copies/mL). A negative result must be combined with clinical observations, patient history, and epidemiological information. The expected result is Negative.  Fact Sheet for Patients:  bloggercourse.com  Fact Sheet for Healthcare Providers:  seriousbroker.it  This test is no t yet approved or cleared by the [place]  FDA and  has  been authorized for detection and/or diagnosis of SARS-CoV-2 by FDA under an Emergency Use Authorization (EUA). This EUA will remain  in effect (meaning this test can be used) for the duration of the COVID-19 declaration under Section 564(b)(1) of the Act, 21 U.S.C.section 360bbb-3(b)(1), unless the authorization is terminated  or revoked sooner.       Influenza A by PCR NEGATIVE NEGATIVE Final   Influenza B by PCR NEGATIVE NEGATIVE Final    Comment: (NOTE) The Xpert Xpress SARS-CoV-2/FLU/RSV plus assay is intended as an aid in the diagnosis of influenza from Nasopharyngeal swab specimens and should not be used as a sole basis for treatment. Nasal washings and aspirates are unacceptable for Xpert Xpress SARS-CoV-2/FLU/RSV testing.  Fact Sheet for Patients: bloggercourse.com  Fact Sheet for Healthcare Providers: seriousbroker.it  This test is not yet approved or cleared by the [place]  FDA and has been authorized for detection and/or diagnosis of SARS-CoV-2 by FDA under an Emergency Use Authorization (EUA). This EUA will remain in effect (meaning this test can be used) for the duration of the COVID-19 declaration under Section 564(b)(1) of the Act, 21 U.S.C. section 360bbb-3(b)(1), unless the authorization is terminated or revoked.     Resp Syncytial Virus by PCR NEGATIVE NEGATIVE Final    Comment: (NOTE) Fact Sheet for Patients: bloggercourse.com  Fact Sheet for Healthcare Providers: seriousbroker.it  This test is not yet approved or cleared by the [place]  FDA and has been authorized for detection and/or diagnosis of SARS-CoV-2 by FDA under an Emergency Use Authorization (EUA). This EUA will remain in effect (meaning this test can be used) for the duration of the COVID-19 declaration under Section 564(b)(1) of the Act, 21 U.S.C. section 360bbb-3(b)(1), unless the  authorization is terminated or revoked.  Performed at [facility], [address], [place], [place] [address]     Labs: CBC: Recent Labs  Lab 09/08/24 [phone] 09/09/24 0447 09/11/24 1746 09/12/24 0440 09/13/24 0647  WBC 18.6* 13.3* 12.7* 12.4* 9.8  HGB 12.1 10.3* 10.6* 10.6* 10.0*  HCT 34.6* 29.4* 31.7* 32.0* 30.1*  MCV 85.4 84.7 88.5 88.6 88.3  PLT 201 193 376 424* 407*   Basic Metabolic Panel: Recent Labs  Lab 09/08/24 0442 09/08/24 1454 09/09/24 0447 09/11/24 1746 09/12/24 0440 09/13/24 0647 09/13/24 0653 09/14/24 0542  NA 127*   < > 128* 134* 137 140  --  140  K 3.4*   < > 3.5 3.6 3.8 3.4*  --  3.9  CL 93*   < > 94* 99 101 105  --  105  CO2 20*   < > 20* 22 24 24   --  22  GLUCOSE 108*   < > 108* 92 106* 109*  --  111*  BUN 29*   < > 21* 13 11 8   --  6*  CREATININE 1.77*   < > 1.60* 1.13* 1.19* 1.10*  --  1.07*  CALCIUM 8.1*   < > 8.1* 8.9 8.5* 8.8*  --  8.4*  MG 2.3  --  2.3  --   --   --  2.2  --    < > = values in this interval not displayed.   Liver Function Tests: Recent Labs  Lab 09/11/24 1746  AST 28  ALT 22  ALKPHOS 93  BILITOT 0.7  PROT 7.0  ALBUMIN  3.4*   CBG: No results for input(s): GLUCAP in the last 168 hours.  Discharge time spent: greater than 30 minutes.  Signed: Lebron JINNY Cage, MD [facility] 09/14/2024 "

## 2024-09-15 ENCOUNTER — Telehealth: Payer: Self-pay | Admitting: Internal Medicine

## 2024-09-15 ENCOUNTER — Telehealth: Payer: Self-pay

## 2024-09-15 MED ORDER — ONDANSETRON 4 MG PO TBDP: 4.0000 mg | ORAL_TABLET | Freq: Three times a day (TID) | ORAL | 0 refills | Status: DC | PRN

## 2024-09-15 NOTE — Transitions of Care (Post Inpatient/ED Visit) (Signed)
" ° °  09/15/2024  Name: Stacy [name] MRN: [id number] DOB: [birth year]  Today's TOC FU Call Status: Today's TOC FU Call Status:: Successful TOC FU Call Completed Unsuccessful Call (1st Attempt) Date: 09/15/24 [facility] FU Call Complete Date: 09/15/24  Patient's Name and Date of Birth confirmed. Name, DOB  Transition Care Management Follow-up Telephone Call Date of Discharge: 09/14/24 Discharge Facility: [facility]) Type of Discharge: Inpatient Admission Primary Inpatient Discharge Diagnosis:: pyelonephritis How have you been since you were released from the hospital?: Same Any questions or concerns?: No  Items Reviewed: Did you receive and understand the discharge instructions provided?: Yes Medications obtained,verified, and reconciled?: Yes (Medications Reviewed) Any new allergies since your discharge?: No Dietary orders reviewed?: Yes Do you have support at home?: No  Medications Reviewed Today: Medications Reviewed Today     Reviewed by Emmitt Pan, LPN (Licensed Practical Nurse) on 09/15/24 at 1548  Med List Status: <None>   Medication Order Taking? Sig Documenting Provider Last Dose Status Informant  acetaminophen  (TYLENOL ) 325 MG tablet [id number] Yes Take 2 tablets (650 mg total) by mouth every 6 (six) hours as needed for mild pain (pain score 1-3), headache or fever. Jerelene Critchley, MD  Active   hydrochlorothiazide  (HYDRODIURIL ) 25 MG tablet [id number] Yes TAKE 1 TABLET (25 MG TOTAL) BY MOUTH DAILY. Antonette Angeline ORN, NP  Active   ondansetron  (ZOFRAN -ODT) 4 MG disintegrating tablet [id number] Yes Take 1 tablet (4 mg total) by mouth every 8 (eight) hours as needed for nausea or vomiting. Antonette Angeline ORN, NP  Active   oxyCODONE  (OXY IR/ROXICODONE ) 5 MG immediate release tablet [id number] Yes Take 0.5-1 tablets (2.5-5 mg total) by mouth every 4 (four) hours as needed for moderate pain (pain score 4-6) or severe pain (pain score 7-10). Jerelene Critchley, MD  Active    prochlorperazine  (COMPAZINE ) 10 MG tablet [id number] Yes Take 0.5 tablets (5 mg total) by mouth every 6 (six) hours as needed for nausea or vomiting. Jerelene Critchley, MD  Active   sulfamethoxazole-trimethoprim (BACTRIM DS) 800-160 MG tablet [id number] Yes Take 1 tablet by mouth 2 (two) times daily for 4 days. Ezenduka, Nkeiruka J, MD  Active   WEGOVY 1 MG/0.5ML SOAJ SQ injection [id number]    Patient not taking: Reported on 09/15/2024   [provider]  Active             Home Care and Equipment/Supplies: Were Home Health Services Ordered?: NA Any new equipment or medical supplies ordered?: NA  Functional Questionnaire: Do you need assistance with bathing/showering or dressing?: No Do you need assistance with meal preparation?: No Do you need assistance with eating?: No Do you have difficulty maintaining continence: No Do you need assistance with getting out of bed/getting out of a chair/moving?: No Do you have difficulty managing or taking your medications?: No  Follow up appointments reviewed: PCP Follow-up appointment confirmed?: Yes Date of PCP follow-up appointment?: 09/18/24 Follow-up Provider[facility] Follow-up appointment confirmed?: NA Do you need transportation to your follow-up appointment?: No Do you understand care options if your condition(s) worsen?: Yes-patient verbalized understanding    SIGNATURE Pan Emmitt, LPN [facility] Nurse Health Advisor Direct Dial [phone]  "

## 2024-09-15 NOTE — Telephone Encounter (Signed)
"  Zofran  sent to pharmacy   "

## 2024-09-15 NOTE — Telephone Encounter (Signed)
 Copied from CRM #[id number]. Topic: Clinical - Medication Refill >> Sep 15, 2024 11:04 AM Montie POUR wrote: Medication:  Zofran  (generic name ondansetron )  - Hospital did not send her home with this medication and she was given this at the hospital for nauseated.  Has the patient contacted their pharmacy? Yes (Agent: If no, request that the patient contact the pharmacy for the refill. If patient does not wish to contact the pharmacy document the reason why and proceed with request.) (Agent: If yes, when and what did the pharmacy advise?)  This is the patient's preferred pharmacy:  [facility] [phone] [address], [place] - [address] [place] [address] Phone: [phone] Fax: [phone]  Is this the correct pharmacy for this prescription? Yes If no, delete pharmacy and type the correct one.   Has the prescription been filled recently? No  Is the patient out of the medication? Yes - She is nauseated today  Has the patient been seen for an appointment in the last year OR does the patient have an upcoming appointment? Yes  Can we respond through MyChart? No  Agent: Please be advised that Rx refills may take up to 3 business days. We ask that you follow-up with your pharmacy.

## 2024-09-15 NOTE — Transitions of Care (Post Inpatient/ED Visit) (Signed)
" ° °  09/15/2024  Name: Stacy [name] MRN: [id number] DOB: [birth year]  Today's TOC FU Call Status: Today's TOC FU Call Status:: Unsuccessful Call (1st Attempt) Unsuccessful Call (1st Attempt) Date: 09/15/24  Attempted to reach the patient regarding the most recent Inpatient/ED visit.  Follow Up Plan: Additional outreach attempts will be made to reach the patient to complete the Transitions of Care (Post Inpatient/ED visit) call.   Signature Julian Lemmings, LPN [facility] Nurse Health Advisor Direct Dial [phone]  "

## 2024-09-15 NOTE — Addendum Note (Signed)
 Addended by: ANTONETTE ANGELINE ORN on: 09/15/2024 01:31 PM   Modules accepted: Orders

## 2024-09-16 NOTE — Telephone Encounter (Signed)
"  Will discuss at upcoming appt   "
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
## 2024-09-18 ENCOUNTER — Encounter: Payer: Self-pay | Admitting: Internal Medicine

## 2024-09-18 ENCOUNTER — Ambulatory Visit: Admitting: Internal Medicine

## 2024-09-18 VITALS — BP 130/84 | Ht 63.0 in | Wt 220.0 lb

## 2024-09-18 DIAGNOSIS — N12 Tubulo-interstitial nephritis, not specified as acute or chronic: Secondary | ICD-10-CM

## 2024-09-18 DIAGNOSIS — N179 Acute kidney failure, unspecified: Secondary | ICD-10-CM

## 2024-09-18 DIAGNOSIS — E871 Hypo-osmolality and hyponatremia: Secondary | ICD-10-CM | POA: Diagnosis not present

## 2024-09-18 DIAGNOSIS — R110 Nausea: Secondary | ICD-10-CM

## 2024-09-18 DIAGNOSIS — E876 Hypokalemia: Secondary | ICD-10-CM

## 2024-09-18 LAB — POCT URINE DIPSTICK
Bilirubin, UA: NEGATIVE
Glucose, UA: NEGATIVE mg/dL
Ketones, POC UA: NEGATIVE mg/dL
Nitrite, UA: NEGATIVE
POC PROTEIN,UA: NEGATIVE
Spec Grav, UA: 1.01
Urobilinogen, UA: 0.2 U/dL
pH, UA: 6

## 2024-09-18 MED ORDER — NITROFURANTOIN MONOHYD MACRO 100 MG PO CAPS: 100.0000 mg | ORAL_CAPSULE | Freq: Two times a day (BID) | ORAL | 0 refills | Status: AC

## 2024-09-18 NOTE — Patient Instructions (Signed)
 Pyelonephritis, Adult  Pyelonephritis is an infection in the kidney. The kidneys are the parts of the body that help clean the blood. They move waste out of the blood and into the pee (urine). This infection can happen fast, or it can last for a long time. In most cases, it clears up with treatment and does not cause other problems. What are the causes? This infection may be caused by germs (bacteria). The germs may go: From your bladder up into your kidney. This may happen after you have a bladder infection. From your blood into your kidney. What increases the risk? You are more likely to get this condition if: You are pregnant. You are older. You have: Diabetes. Prostatitis. This is irritation and swelling of the prostate gland. Kidney stones or bladder stones. Other problems with your kidney or the parts of your body that carry pee from the kidneys to the bladder (ureters). Cancer. You have a soft tube called a catheter in your bladder. You are sexually active. You use a medicine that kills sperm (spermicide). This may be used to prevent pregnancy. You have had a urinary tract infection (UTI) before. What are the signs or symptoms? Peeing often. Feeling the need to pee right away. A burning or stinging feeling when you pee. Pain in your belly, back, or side. Fever or chills. Vomiting or feeling like you may vomit. Dark pee or blood in your pee. How is this treated? You may be given antibiotics to take. You will need to drink lots of fluids. If the infection is bad, you may need to stay in the hospital. You may be given antibiotics and fluids through an IV. In some cases, other treatments may be needed. Follow these instructions at home: Eating and drinking Drink enough fluid to keep your pee pale yellow. Avoid caffeine, tea, and drinks that are bubbly (carbonated). General instructions Take over-the-counter and prescription medicines as told by your doctor. Finish your  antibiotics even if you start to feel better. Pee (urinate) often. Do not hold in your pee for long periods of time. Pee before and after you have sex. If you are female, wipe from front to back after you poop (have a bowel movement). Use each tissue only once. Keep all follow-up visits. Your doctor will want to make sure that you are getting better. Contact a doctor if: You do not feel better after 2 days. Your symptoms get worse. You have a fever or chills. You cannot take your medicine. Get help right away if: You vomit each time that you eat or drink. You have very bad pain in your back or side. You are very weak, or you faint. This information is not intended to replace advice given to you by your health care provider. Make sure you discuss any questions you have with your health care provider. Document Revised: 03/26/2022 Document Reviewed: 03/26/2022 Elsevier Patient Education  2024 [employer].

## 2024-09-19 ENCOUNTER — Ambulatory Visit: Payer: Self-pay | Admitting: Internal Medicine

## 2024-09-19 LAB — COMPREHENSIVE METABOLIC PANEL WITH GFR
AG Ratio: 1.2 (calc) (ref 1.0–2.5)
ALT: 15 U/L (ref 6–29)
AST: 15 U/L (ref 10–35)
Albumin: 3.7 g/dL (ref 3.6–5.1)
Alkaline phosphatase (APISO): 69 U/L (ref 37–153)
BUN/Creatinine Ratio: 8 (calc) (ref 6–22)
BUN: 11 mg/dL (ref 7–25)
CO2: 29 mmol/L (ref 20–32)
Calcium: 9.2 mg/dL (ref 8.6–10.4)
Chloride: 104 mmol/L (ref 98–110)
Creat: 1.33 mg/dL — ABNORMAL HIGH (ref 0.50–1.05)
Globulin: 3.2 g/dL (ref 1.9–3.7)
Glucose, Bld: 90 mg/dL (ref 65–99)
Potassium: 4.4 mmol/L (ref 3.5–5.3)
Sodium: 142 mmol/L (ref 135–146)
Total Bilirubin: 0.6 mg/dL (ref 0.2–1.2)
Total Protein: 6.9 g/dL (ref 6.1–8.1)
eGFR: 46 mL/min/1.73m2 — ABNORMAL LOW

## 2024-09-19 LAB — CBC
HCT: 36.3 % (ref 35.9–46.0)
Hemoglobin: 11.7 g/dL (ref 11.7–15.5)
MCH: 29.1 pg (ref 27.0–33.0)
MCHC: 32.2 g/dL (ref 31.6–35.4)
MCV: 90.3 fL (ref 81.4–101.7)
MPV: 10 fL (ref 7.5–12.5)
Platelets: 613 Thousand/uL — ABNORMAL HIGH (ref 140–400)
RBC: 4.02 Million/uL (ref 3.80–5.10)
RDW: 13 % (ref 11.0–15.0)
WBC: 10.1 Thousand/uL (ref 3.8–10.8)

## 2024-09-19 LAB — URINE CULTURE
MICRO NUMBER:: 17419943
SPECIMEN QUALITY:: ADEQUATE

## 2024-09-21 DIAGNOSIS — Z0279 Encounter for issue of other medical certificate: Secondary | ICD-10-CM

## 2024-10-02 ENCOUNTER — Other Ambulatory Visit: Payer: Self-pay | Admitting: Internal Medicine

## 2024-10-02 NOTE — Telephone Encounter (Signed)
 Requested medications are due for refill today.  yes  Requested medications are on the active medications list.  yes  Last refill. 09/15/2024 #30 0 rf  Future visit scheduled.   no  Notes to clinic.  Refill not delegated.    Requested Prescriptions  Pending Prescriptions Disp Refills   ondansetron  (ZOFRAN -ODT) 4 MG disintegrating tablet [Pharmacy Med Name: ONDANSETRON  ODT 4 MG TABLET] 18 tablet 1    Sig: TAKE 1 TABLET BY MOUTH EVERY 8 HOURS AS NEEDED FOR NAUSEA AND VOMITING     Not Delegated - Gastroenterology: Antiemetics - ondansetron  Failed - 10/02/2024 12:57 PM      Failed - This refill cannot be delegated      Passed - AST in normal range and within 360 days    AST  Date Value Ref Range Status  09/18/2024 15 10 - 35 U/L Final  09/11/2017 15 5 - 34 U/L Final         Passed - ALT in normal range and within 360 days    ALT  Date Value Ref Range Status  09/18/2024 15 6 - 29 U/L Final  09/11/2017 13 0 - 55 U/L Final         Passed - Valid encounter within last 6 months    Recent Outpatient Visits           2 weeks ago Pyelonephritis   [place] [place], Angeline ORN, NP   8 months ago Mixed hyperlipidemia   [place] [facility] [place], Angeline ORN, [place]
